# Patient Record
Sex: Female | Born: 1935 | Race: White | Hispanic: No | Smoking: Former smoker
Health system: Southern US, Community
[De-identification: ages and names within clinical notes are randomized; demographics above are authoritative.]

## PROBLEM LIST (undated history)

## (undated) DIAGNOSIS — F329 Major depressive disorder, single episode, unspecified: Secondary | ICD-10-CM

## (undated) DIAGNOSIS — D631 Anemia in chronic kidney disease: Secondary | ICD-10-CM

## (undated) DIAGNOSIS — I82409 Acute embolism and thrombosis of unspecified deep veins of unspecified lower extremity: Secondary | ICD-10-CM

## (undated) DIAGNOSIS — D510 Vitamin B12 deficiency anemia due to intrinsic factor deficiency: Secondary | ICD-10-CM

## (undated) DIAGNOSIS — H3530 Unspecified macular degeneration: Secondary | ICD-10-CM

## (undated) DIAGNOSIS — R42 Dizziness and giddiness: Secondary | ICD-10-CM

## (undated) DIAGNOSIS — I6529 Occlusion and stenosis of unspecified carotid artery: Secondary | ICD-10-CM

## (undated) DIAGNOSIS — K909 Intestinal malabsorption, unspecified: Secondary | ICD-10-CM

## (undated) DIAGNOSIS — D500 Iron deficiency anemia secondary to blood loss (chronic): Secondary | ICD-10-CM

## (undated) DIAGNOSIS — N183 Chronic kidney disease, stage 3 (moderate): Secondary | ICD-10-CM

## (undated) DIAGNOSIS — D649 Anemia, unspecified: Secondary | ICD-10-CM

## (undated) DIAGNOSIS — E042 Nontoxic multinodular goiter: Secondary | ICD-10-CM

## (undated) DIAGNOSIS — I10 Essential (primary) hypertension: Secondary | ICD-10-CM

## (undated) DIAGNOSIS — I739 Peripheral vascular disease, unspecified: Secondary | ICD-10-CM

## (undated) DIAGNOSIS — M1990 Unspecified osteoarthritis, unspecified site: Secondary | ICD-10-CM

## (undated) DIAGNOSIS — F32A Depression, unspecified: Secondary | ICD-10-CM

## (undated) DIAGNOSIS — I8390 Asymptomatic varicose veins of unspecified lower extremity: Secondary | ICD-10-CM

## (undated) DIAGNOSIS — Q2112 Patent foramen ovale: Secondary | ICD-10-CM

## (undated) DIAGNOSIS — K449 Diaphragmatic hernia without obstruction or gangrene: Secondary | ICD-10-CM

## (undated) DIAGNOSIS — Q211 Atrial septal defect: Secondary | ICD-10-CM

## (undated) DIAGNOSIS — N189 Chronic kidney disease, unspecified: Secondary | ICD-10-CM

## (undated) DIAGNOSIS — R011 Cardiac murmur, unspecified: Secondary | ICD-10-CM

## (undated) DIAGNOSIS — E785 Hyperlipidemia, unspecified: Secondary | ICD-10-CM

## (undated) DIAGNOSIS — I2510 Atherosclerotic heart disease of native coronary artery without angina pectoris: Secondary | ICD-10-CM

## (undated) HISTORY — DX: Anemia in chronic kidney disease: D63.1

## (undated) HISTORY — DX: Hyperlipidemia, unspecified: E78.5

## (undated) HISTORY — PX: OTHER SURGICAL HISTORY: SHX169

## (undated) HISTORY — DX: Diaphragmatic hernia without obstruction or gangrene: K44.9

## (undated) HISTORY — DX: Occlusion and stenosis of unspecified carotid artery: I65.29

## (undated) HISTORY — PX: LUMBAR DISC SURGERY: SHX700

## (undated) HISTORY — DX: Essential (primary) hypertension: I10

## (undated) HISTORY — DX: Iron deficiency anemia secondary to blood loss (chronic): D50.0

## (undated) HISTORY — DX: Asymptomatic varicose veins of unspecified lower extremity: I83.90

## (undated) HISTORY — PX: INCONTINENCE SURGERY: SHX676

## (undated) HISTORY — DX: Anemia, unspecified: D64.9

## (undated) HISTORY — PX: APPENDECTOMY: SHX54

## (undated) HISTORY — DX: Vitamin B12 deficiency anemia due to intrinsic factor deficiency: D51.0

## (undated) HISTORY — DX: Intestinal malabsorption, unspecified: K90.9

## (undated) HISTORY — DX: Acute embolism and thrombosis of unspecified deep veins of unspecified lower extremity: I82.409

## (undated) HISTORY — PX: TOE SURGERY: SHX1073

## (undated) HISTORY — DX: Unspecified macular degeneration: H35.30

## (undated) HISTORY — PX: EYE SURGERY: SHX253

## (undated) HISTORY — DX: Atherosclerotic heart disease of native coronary artery without angina pectoris: I25.10

## (undated) HISTORY — DX: Chronic kidney disease, stage 3 (moderate): N18.3

## (undated) HISTORY — PX: SPINE SURGERY: SHX786

## (undated) HISTORY — DX: Unspecified osteoarthritis, unspecified site: M19.90

## (undated) HISTORY — DX: Chronic kidney disease, unspecified: N18.9

## (undated) HISTORY — PX: CATARACT EXTRACTION: SUR2

## (undated) HISTORY — PX: VARICOSE VEIN SURGERY: SHX832

---

## 1969-08-24 HISTORY — PX: ABDOMINAL HYSTERECTOMY: SHX81

## 2000-02-05 ENCOUNTER — Other Ambulatory Visit: Admission: RE | Admit: 2000-02-05 | Discharge: 2000-02-05 | Payer: Self-pay | Admitting: Family Medicine

## 2000-03-11 ENCOUNTER — Other Ambulatory Visit: Admission: RE | Admit: 2000-03-11 | Discharge: 2000-03-11 | Payer: Self-pay | Admitting: Obstetrics and Gynecology

## 2000-04-07 ENCOUNTER — Other Ambulatory Visit: Admission: RE | Admit: 2000-04-07 | Discharge: 2000-04-07 | Payer: Self-pay | Admitting: Obstetrics and Gynecology

## 2000-04-07 ENCOUNTER — Encounter (INDEPENDENT_AMBULATORY_CARE_PROVIDER_SITE_OTHER): Payer: Self-pay

## 2000-12-06 ENCOUNTER — Other Ambulatory Visit: Admission: RE | Admit: 2000-12-06 | Discharge: 2000-12-06 | Payer: Self-pay | Admitting: Obstetrics and Gynecology

## 2000-12-21 ENCOUNTER — Encounter: Payer: Self-pay | Admitting: Family Medicine

## 2000-12-21 ENCOUNTER — Encounter: Admission: RE | Admit: 2000-12-21 | Discharge: 2000-12-21 | Payer: Self-pay | Admitting: Family Medicine

## 2000-12-23 ENCOUNTER — Ambulatory Visit (HOSPITAL_COMMUNITY): Admission: RE | Admit: 2000-12-23 | Discharge: 2000-12-23 | Payer: Self-pay | Admitting: Family Medicine

## 2000-12-23 ENCOUNTER — Encounter: Payer: Self-pay | Admitting: Family Medicine

## 2000-12-23 ENCOUNTER — Encounter (INDEPENDENT_AMBULATORY_CARE_PROVIDER_SITE_OTHER): Payer: Self-pay

## 2001-03-01 ENCOUNTER — Encounter: Payer: Self-pay | Admitting: Family Medicine

## 2001-03-01 ENCOUNTER — Ambulatory Visit (HOSPITAL_COMMUNITY): Admission: RE | Admit: 2001-03-01 | Discharge: 2001-03-01 | Payer: Self-pay | Admitting: Family Medicine

## 2001-03-15 ENCOUNTER — Encounter: Payer: Self-pay | Admitting: Neurological Surgery

## 2001-03-17 ENCOUNTER — Inpatient Hospital Stay (HOSPITAL_COMMUNITY): Admission: RE | Admit: 2001-03-17 | Discharge: 2001-03-18 | Payer: Self-pay | Admitting: Neurological Surgery

## 2001-03-17 ENCOUNTER — Encounter: Payer: Self-pay | Admitting: Neurological Surgery

## 2001-06-13 ENCOUNTER — Other Ambulatory Visit: Admission: RE | Admit: 2001-06-13 | Discharge: 2001-06-13 | Payer: Self-pay | Admitting: Obstetrics and Gynecology

## 2001-11-28 ENCOUNTER — Other Ambulatory Visit: Admission: RE | Admit: 2001-11-28 | Discharge: 2001-11-28 | Payer: Self-pay | Admitting: Obstetrics and Gynecology

## 2002-06-19 ENCOUNTER — Other Ambulatory Visit: Admission: RE | Admit: 2002-06-19 | Discharge: 2002-06-19 | Payer: Self-pay | Admitting: Obstetrics and Gynecology

## 2002-08-24 HISTORY — PX: BLADDER SURGERY: SHX569

## 2003-01-01 ENCOUNTER — Other Ambulatory Visit: Admission: RE | Admit: 2003-01-01 | Discharge: 2003-01-01 | Payer: Self-pay | Admitting: Obstetrics and Gynecology

## 2003-07-23 ENCOUNTER — Other Ambulatory Visit: Admission: RE | Admit: 2003-07-23 | Discharge: 2003-07-23 | Payer: Self-pay | Admitting: Obstetrics and Gynecology

## 2004-02-04 ENCOUNTER — Other Ambulatory Visit: Admission: RE | Admit: 2004-02-04 | Discharge: 2004-02-04 | Payer: Self-pay | Admitting: Obstetrics and Gynecology

## 2005-04-06 ENCOUNTER — Other Ambulatory Visit: Admission: RE | Admit: 2005-04-06 | Discharge: 2005-04-06 | Payer: Self-pay | Admitting: Obstetrics and Gynecology

## 2007-03-22 ENCOUNTER — Ambulatory Visit (HOSPITAL_COMMUNITY): Admission: RE | Admit: 2007-03-22 | Discharge: 2007-03-22 | Payer: Self-pay | Admitting: Obstetrics and Gynecology

## 2007-04-01 ENCOUNTER — Ambulatory Visit: Payer: Self-pay | Admitting: Internal Medicine

## 2007-04-05 ENCOUNTER — Ambulatory Visit (HOSPITAL_COMMUNITY): Admission: RE | Admit: 2007-04-05 | Discharge: 2007-04-05 | Payer: Self-pay | Admitting: Internal Medicine

## 2007-04-06 ENCOUNTER — Ambulatory Visit: Payer: Self-pay | Admitting: Cardiovascular Disease

## 2007-04-21 ENCOUNTER — Inpatient Hospital Stay (HOSPITAL_COMMUNITY): Admission: RE | Admit: 2007-04-21 | Discharge: 2007-04-23 | Payer: Self-pay | Admitting: Obstetrics and Gynecology

## 2008-05-24 ENCOUNTER — Ambulatory Visit: Payer: Self-pay | Admitting: Internal Medicine

## 2008-06-19 ENCOUNTER — Ambulatory Visit: Payer: Self-pay

## 2008-12-06 ENCOUNTER — Ambulatory Visit: Payer: Self-pay

## 2008-12-17 ENCOUNTER — Telehealth (INDEPENDENT_AMBULATORY_CARE_PROVIDER_SITE_OTHER): Payer: Self-pay | Admitting: *Deleted

## 2009-03-18 ENCOUNTER — Encounter: Payer: Self-pay | Admitting: Internal Medicine

## 2009-05-15 ENCOUNTER — Telehealth: Payer: Self-pay | Admitting: Internal Medicine

## 2009-06-03 ENCOUNTER — Encounter: Payer: Self-pay | Admitting: Internal Medicine

## 2009-06-13 ENCOUNTER — Encounter: Payer: Self-pay | Admitting: Internal Medicine

## 2009-06-13 ENCOUNTER — Ambulatory Visit: Payer: Self-pay

## 2009-06-21 ENCOUNTER — Encounter: Payer: Self-pay | Admitting: Internal Medicine

## 2009-06-21 DIAGNOSIS — I6529 Occlusion and stenosis of unspecified carotid artery: Secondary | ICD-10-CM | POA: Insufficient documentation

## 2009-06-24 ENCOUNTER — Telehealth: Payer: Self-pay | Admitting: Internal Medicine

## 2009-06-26 ENCOUNTER — Ambulatory Visit: Payer: Self-pay | Admitting: Cardiology

## 2009-07-04 ENCOUNTER — Telehealth: Payer: Self-pay | Admitting: Internal Medicine

## 2009-07-05 ENCOUNTER — Telehealth: Payer: Self-pay | Admitting: Internal Medicine

## 2009-07-16 ENCOUNTER — Telehealth: Payer: Self-pay | Admitting: Internal Medicine

## 2009-07-17 DIAGNOSIS — I10 Essential (primary) hypertension: Secondary | ICD-10-CM | POA: Insufficient documentation

## 2009-07-17 DIAGNOSIS — E785 Hyperlipidemia, unspecified: Secondary | ICD-10-CM | POA: Insufficient documentation

## 2009-07-29 ENCOUNTER — Ambulatory Visit: Payer: Self-pay | Admitting: Surgery

## 2009-07-29 ENCOUNTER — Encounter: Payer: Self-pay | Admitting: Internal Medicine

## 2009-08-02 ENCOUNTER — Ambulatory Visit: Payer: Self-pay | Admitting: Internal Medicine

## 2009-08-06 ENCOUNTER — Telehealth: Payer: Self-pay | Admitting: Internal Medicine

## 2009-10-14 ENCOUNTER — Encounter: Payer: Self-pay | Admitting: Internal Medicine

## 2009-10-22 ENCOUNTER — Encounter: Payer: Self-pay | Admitting: Internal Medicine

## 2009-10-25 ENCOUNTER — Ambulatory Visit: Payer: Self-pay | Admitting: Internal Medicine

## 2009-10-28 LAB — CONVERTED CEMR LAB
BUN: 13 mg/dL (ref 6–23)
Basophils Absolute: 0 10*3/uL (ref 0.0–0.1)
Basophils Relative: 0.4 % (ref 0.0–3.0)
CO2: 29 meq/L (ref 19–32)
Calcium: 9.1 mg/dL (ref 8.4–10.5)
Chloride: 96 meq/L (ref 96–112)
Creatinine, Ser: 0.8 mg/dL (ref 0.4–1.2)
Eosinophils Absolute: 0.1 10*3/uL (ref 0.0–0.7)
Eosinophils Relative: 1.6 % (ref 0.0–5.0)
GFR calc non Af Amer: 74 mL/min (ref >60)
Glucose, Bld: 99 mg/dL (ref 70–99)
HCT: 30.8 % — ABNORMAL LOW (ref 36.0–46.0)
Hemoglobin: 10 g/dL — ABNORMAL LOW (ref 12.0–15.0)
Lymphocytes Relative: 19.2 % (ref 12.0–46.0)
Lymphs Abs: 1.2 10*3/uL (ref 0.7–4.0)
MCHC: 32.6 g/dL (ref 30.0–36.0)
MCV: 95.2 fL (ref 78.0–100.0)
Monocytes Absolute: 0.6 10*3/uL (ref 0.1–1.0)
Monocytes Relative: 9.7 % (ref 3.0–12.0)
Neutro Abs: 4.3 10*3/uL (ref 1.4–7.7)
Neutrophils Relative %: 69.1 % (ref 43.0–77.0)
Platelets: 252 10*3/uL (ref 150.0–400.0)
Potassium: 4.7 meq/L (ref 3.5–5.1)
RBC: 3.23 M/uL — ABNORMAL LOW (ref 3.87–5.11)
RDW: 13.4 % (ref 11.5–14.6)
Sodium: 128 meq/L — ABNORMAL LOW (ref 135–145)
TSH: 2.2 microintl units/mL (ref 0.35–5.50)
WBC: 6.2 10*3/uL (ref 4.5–10.5)

## 2009-12-17 ENCOUNTER — Encounter: Payer: Self-pay | Admitting: Internal Medicine

## 2010-02-03 ENCOUNTER — Ambulatory Visit: Payer: Self-pay | Admitting: Surgery

## 2010-05-16 ENCOUNTER — Encounter: Payer: Self-pay | Admitting: Internal Medicine

## 2010-06-16 ENCOUNTER — Telehealth: Payer: Self-pay | Admitting: Internal Medicine

## 2010-08-14 ENCOUNTER — Encounter: Payer: Self-pay | Admitting: Internal Medicine

## 2010-08-14 ENCOUNTER — Ambulatory Visit: Payer: Self-pay | Admitting: Surgery

## 2010-09-23 NOTE — Progress Notes (Signed)
Summary: feet/ankles swollen yesterday  Phone Note Call from Patient   Caller: Patient [phone] Reason for Call: Talk to Nurse Summary of Call: pt's ankles and feet swelling yesterday was on her feet a lot, her dtr is nurse and insisted she call dr Tenny Craw today, however her swelling is completely gone today-anything she needs to do? Initial call taken by: Glynda Jaeger,  June 16, 2010 1:11 PM  Follow-up for Phone Call        Pt. stated that her feet were swollen yesterday. She elevated them yesterday & they are no longer swollen. Pt. has not gained a significant amount of weight and has been instructed to call us if she gains a significant amount in a short period of time. No C/O of CP or SOB. Pt. will check her weight daily & call us with any further problems or if her edema returns and does not resolve with  elevation. Whitney Maeola Sarah RN  June 16, 2010 1:24 PM  Follow-up by: Whitney Maeola Sarah RN,  June 16, 2010 1:18 PM

## 2010-09-23 NOTE — Assessment & Plan Note (Signed)
Summary: per check out/sf   Visit Type:  Follow-up [facility]  CC:  no complaints.  History of Present Illness: Patient is a 77 year old with a history of hypertension, CV disease, dyslipidemia.  She was last seen in December.  At that time I added 12.5 mg HCTZ because her bp was up. IN January she got the flu.  It has taken her a long time to recover. She has checked her bp since starting the HCTZ and says it has been better.  Systolic in 130s.  She denies dizziness. She had blood work at [facility] on 2/21/ and 2/28.  She was told NA was low and to stop HCTZ.  SHe has continued to take it.  Did not take today. Denies chest pain.  APpetite improveing.  Always cold  Current Medications (verified): 1)  Aspirin 81 Mg  Tabs (Aspirin) 2)  Lisinopril 20 Mg Tabs (Lisinopril) .Marland Kitchen.. 1 Tab Once Daily 3)  Calicum Witn Vitamin D .... 2 Tab Daily 4)  Estrogen Cream .... Q Week 5)  Allegra .... As Directed 6)  Ibprofen .... As Needed 7)  Lipitor 10 Mg Tabs (Atorvastatin Calcium) .... Take One Tablet By Mouth Daily. 8)  Amlodipine Besylate 10 Mg Tabs (Amlodipine Besylate) .... Take One Tablet By Mouth Daily 9)  Vit D Supplement .Marland Kitchen.. 1000 Units  2 Times A Day 10)  Hydrochlorothiazide 25 Mg Tabs (Hydrochlorothiazide) .... One Half A Tablet Every Day  Allergies (verified): 1)  ! * Regular Asa 2)  ! Pcn  Past History:  Past Medical History: Last updated: 07/17/2009 Current Problems:  DYSLIPIDEMIA (ICD-272.4) HYPERTENSION (ICD-401.9) CAROTID STENOSIS (ICD-433.10)  Past Surgical History: Last updated: 07/17/2009 Toe surgery, laser surgery for a retinal tear,   tonsillectomy, total vaginal hysterectomy.      Social History: Last updated: 07/17/2009  The patient smokes 2 cigarettes per day for the past 60   years, does not drink alcohol.   Vital Signs:  Patient profile:   77 year old female Height:      67 inches Weight:      126 pounds BMI:     19.81 Pulse  rate:   60 / minute BP sitting:   151 / 59  (left arm) Cuff size:   regular  Vitals Entered By: Burnett Kanaris, CNA (October 25, 2009 9:20 AM)  Physical Exam  Additional Exam:  HEENT:  Normocephalic, atraumatic. EOMI, PERRLA.  Neck: JVP is normal. No thyromegaly. No bruits.  Lungs: clear to auscultation. No rales no wheezes.  Heart: Regular rate and rhythm. Normal S1, S2. No S3.   No significant murmurs. PMI not displaced.  Abdomen:  Supple, nontender. Normal bowel sounds. No masses. No hepatomegaly.  Extremities:   Good distal pulses throughout. No lower extremity edema.  Musculoskeletal :moving all extremities.  Neuro:   alert and oriented x3.    Impression & Recommendations:  Problem # 1:  HYPERTENSION (ICD-401.9)  BP a little high on arrival  ON my check 145/45.  Wating for labs.  Na from 2/22 was 130.  Repeated 1/28  Na was 126.  She has continued HCTZ even though she has been told to stop.  Did not take today. Would check BMET and TSH and CBC t today.  Will get back in touch with her on what to do.  Hold HCTZ today. Her updated medication list for this problem includes:    Aspirin 81 Mg Tabs (Aspirin)    Lisinopril 20  Mg Tabs (Lisinopril) .Marland Kitchen... 1 tab once daily    Amlodipine Besylate 10 Mg Tabs (Amlodipine besylate) .Marland Kitchen... Take one tablet by mouth daily    Hydrochlorothiazide 25 Mg Tabs (Hydrochlorothiazide) ..... One half a tablet every day  Orders: TLB-BMP (Basic Metabolic Panel-BMET) (80048-METABOL) TLB-CBC Platelet - w/Differential (85025-CBCD) TLB-TSH (Thyroid Stimulating Hormone) (84443-TSH)  Problem # 2:  DYSLIPIDEMIA (ICD-272.4) Assessment: Unchanged Excellent control.  LDL 62, HDL 55.  COntinue.  Just checked 2/22:  LDL 52, HDL 74. Her updated medication list for this problem includes:    Lipitor 10 Mg Tabs (Atorvastatin calcium) .Marland Kitchen... Take one tablet by mouth daily.  Problem # 3:  CAROTID STENOSIS (ICD-433.10) F/U with Dr. Myra Gianotti this summer. Her updated  medication list for this problem includes:    Aspirin 81 Mg Tabs (Aspirin)  Patient Instructions: 1)  Your physician recommends that you return for lab work in: lab work today...we will call you with results.   Appended Document: per check out/sf Sodium is a little low.  Stop HCTZ.  Keep check of bp at home.  Send in readings in a few wks.  Appended Document: per check out/sf Patient aware of above and also LM on cell hone with her daughter [name] with lab results and need for BP checks at home.

## 2010-09-23 NOTE — Miscellaneous (Signed)
Summary: Orders Update  Clinical Lists Changes  Orders: Added new Test order of Carotid Duplex (Carotid Duplex) - Signed 

## 2010-10-10 ENCOUNTER — Encounter: Payer: Self-pay | Admitting: Internal Medicine

## 2010-10-21 ENCOUNTER — Encounter (INDEPENDENT_AMBULATORY_CARE_PROVIDER_SITE_OTHER): Payer: Self-pay | Admitting: *Deleted

## 2010-10-30 NOTE — Letter (Signed)
Summary: Appointment - Reminder 2  [facility], Main Office  [address]   [place], [place] [address]   Phone: [phone]  Fax: [phone]     October 21, 2010 MRN: [id number]   VERTA RIEDLINGER [address] [place], [place]  [address]   Dear Ms. Lillia Mountain,  Our records indicate that it is time to schedule a follow-up appointment.Dr.Ross recommended that you follow up with [place] in March. It is very important that we reach you to schedule this appointment. We look forward to participating in your health care needs. Please contact us at the number listed above at your earliest convenience to schedule your appointment.  If you are unable to make an appointment at this time, give [place] a call so we can update our records.     Sincerely, Artist

## 2010-12-15 ENCOUNTER — Ambulatory Visit (INDEPENDENT_AMBULATORY_CARE_PROVIDER_SITE_OTHER): Payer: Medicare Other | Admitting: Internal Medicine

## 2010-12-15 ENCOUNTER — Encounter: Payer: Self-pay | Admitting: Internal Medicine

## 2010-12-15 VITALS — BP 126/64 | HR 61 | Ht 67.0 in | Wt 136.0 lb

## 2010-12-15 DIAGNOSIS — I2510 Atherosclerotic heart disease of native coronary artery without angina pectoris: Secondary | ICD-10-CM

## 2010-12-15 DIAGNOSIS — I10 Essential (primary) hypertension: Secondary | ICD-10-CM

## 2010-12-15 DIAGNOSIS — I6529 Occlusion and stenosis of unspecified carotid artery: Secondary | ICD-10-CM

## 2010-12-15 DIAGNOSIS — R011 Cardiac murmur, unspecified: Secondary | ICD-10-CM

## 2010-12-15 DIAGNOSIS — E785 Hyperlipidemia, unspecified: Secondary | ICD-10-CM

## 2010-12-15 NOTE — Patient Instructions (Signed)
Your physician has requested that you have an echocardiogram. Echocardiography is a painless test that uses sound waves to create images of your heart. It provides your doctor with information about the size and shape of your heart and how well your heart's chambers and valves are working. This procedure takes approximately one hour. There are no restrictions for this procedure.  Your physician wants you to follow-up in: 12 months You will receive a reminder letter in the mail two months in advance. If you don't receive a letter, please call our office to schedule the follow-up appointment.  

## 2010-12-16 DIAGNOSIS — R011 Cardiac murmur, unspecified: Secondary | ICD-10-CM | POA: Insufficient documentation

## 2010-12-16 NOTE — Assessment & Plan Note (Signed)
Will f/u with V. Brabham.

## 2010-12-16 NOTE — Progress Notes (Signed)
HPIPatient is a 77 year old with a history of hypertension, CV disease, dyslipidemia.  She was last seen in March of last year. In the interval she has done OK.  She denies CP.  No dizziness.  Lipids drawn after the holidays showed LDL of 98 which is up from 71  Earlier.  Paitnet admits to diet indiscrestions over Xmas.  IS back to normal eatting. Due to be seen by Arelia Longest in June.    Allergies  Allergen Reactions  . Penicillins     Current Outpatient Prescriptions  Medication Sig Dispense Refill  . amLODipine (NORVASC) 10 MG tablet Take 10 mg by mouth daily.        Marland Kitchen aspirin 81 MG tablet Take 81 mg by mouth daily.        Marland Kitchen atorvastatin (LIPITOR) 10 MG tablet Take 10 mg by mouth daily.        . Calcium Citrate (CALCITRATE PO) Take by mouth. 2 tabs daily       . conjugated estrogens (PREMARIN) vaginal cream Place vaginally daily. Twice a week       . ibuprofen (ADVIL,MOTRIN) 200 MG tablet Take 200 mg by mouth every 6 (six) hours as needed.        Marland Kitchen lisinopril (PRINIVIL,ZESTRIL) 20 MG tablet Take 20 mg by mouth daily. 2 tabs daily       . VITAMIN D, CHOLECALCIFEROL, PO Take by mouth. 2 TABS DAILY (2000 UNITS DAILY)         No past medical history on file.  No past surgical history on file.  No family history on file.  History   Social History  . Marital Status: Widowed    Spouse Name: N/A    Number of Children: N/A  . Years of Education: N/A   Occupational History  . Not on file.   Social History Main Topics  . Smoking status: Former Smoker    Types: Cigarettes    Quit date: 08/24/2009  . Smokeless tobacco: Not on file  . Alcohol Use: Not on file  . Drug Use: Not on file  . Sexually Active: Not on file   Other Topics Concern  . Not on file   Social History Narrative  . No narrative on file    Review of Systems:  All systems reviewed.  They are negative to the above problem except as previously stated.  Vital Signs: BP 126/64  Pulse 61  Ht 5\' 7"  (1.702 m)   Wt 136 lb (61.689 kg)  BMI 21.30 kg/m2  Physical Exam  HEENT:  Normocephalic, atraumatic. EOMI, PERRLA.  Neck: JVP is normal. No thyromegaly. Bilateral carotid bruits. Lungs: clear to auscultation. No rales no wheezes.  Heart: Regular rate and rhythm. Normal S1, S2. No S3.   II/VI systolic murmur.Marland Kitchen PMI not displaced.  Abdomen:  Supple, nontender. Normal bowel sounds. No masses. No hepatomegaly.  Extremities:   Good distal pulses throughout. No lower extremity edema.   EKG:  Sinus rhythm.  65 bpm. Sl ST depression with T wave inversion. II, III, AVF, V3 to V6. Musculoskeletal :moving all extremities.  Neuro:   alert and oriented x3.  CN II-XII grossly intact.   Assessment and Plan:

## 2010-12-16 NOTE — Assessment & Plan Note (Signed)
Would keep on same regimen.  Diet is back to baseline.

## 2010-12-16 NOTE — Assessment & Plan Note (Signed)
Continue current meds 

## 2011-01-06 NOTE — Assessment & Plan Note (Signed)
[facility]                            CARDIOLOGY OFFICE NOTE   NAME:[name], [name]                      MRN:          [id number]  DATE:05/24/2008                            DOB:          Sep 27, 1930    IDENTIFICATION:  Mrs. Lomanto is a 77 year old woman, I last saw her in  August 2008, as part of a preop evaluation for gynecologic surgery.  She  had an abnormal EKG, underwent an echocardiogram that showed mild LVH.  Also, had carotid Dopplers done that showed moderate cerebrovascular  disease.   Because of the patient's status, I felt she was at low risk for a  cardiac event, underwent the surgery that was uneventful.   She comes in today for continued followup.  She denies chest pressure,  no shortness of breath.  No palpitations.   CURRENT MEDICINES:  1. Caduet 10/10.  2. Lisinopril 20.  3. Calcium with D.  4. Estrogen cream.  5. Aspirin 81.   PHYSICAL EXAMINATION:  GENERAL:  The patient is in no distress.  VITAL SIGNS:  Blood pressure 143/66, pulse 61 and regular, weight 133.  NECK:  JVP is normal.  Left carotid bruit.  LUNGS:  Clear without rales or wheezes.  CARDIAC:  Regular rate and rhythm, S1, S2, no S3, no significant  murmurs.  ABDOMEN:  Benign.  EXTREMITIES:  No lower extremity edema.   A 12-lead EKG shows normal sinus rhythm.  ST-T wave changes, consider  ischemia.  Note, does not meet criteria for LVH on today's exam.  ST  changes are unchanged.   IMPRESSION:  1. Cardiovascular disease.  Last carotid scan done in a year ago      showed moderate disease with 50-69% left ICA stenosis and a 50-69%      right stenosis.  I would set up for followup.  2. Dyslipidemia, on Caduet.  Last labs were in 2008.  She needs to      have her repeat drawn.  She is due to have her full evaluation.      She can have this checked.  3. Hypertension, adequate control.  4. Health care maintenance with cardiovascular disease.  Recommend  continue aspirin.   Tentatively, set followup for 1 year.  I will be in touch with her once  I have seen the blood work and the test result.     Pricilla Riffle, MD, [facility]  Electronically Signed    PVR/MedQ  DD: 05/24/2008  DT: 05/25/2008  Job #: [id number]   cc:   Guy Sandifer. Henderson Cloud, M.D.

## 2011-01-06 NOTE — Op Note (Signed)
[name], [name]               ACCOUNT NO.:  [id number]   MEDICAL RECORD NO.:  [id number]          PATIENT TYPE:  INP   LOCATION:  9308                          FACILITY:  [facility]   PHYSICIAN:  Guy Sandifer. Henderson Cloud, M.D. DATE OF BIRTH:  05/11/1931   DATE OF PROCEDURE:  04/21/2007  DATE OF DISCHARGE:                               OPERATIVE REPORT   PREOPERATIVE DIAGNOSES:  1. Pelvic relaxation.  2. Stress urinary continence.   POSTOPERATIVE DIAGNOSES:  1. Pelvic relaxation.  2. Stress urinary continence.   PROCEDURE:  Mid urethral transobturator tape.  Anterior vaginal repair  with Perigee graft, posterior vaginal repair and cystoscopy.   SURGEON:  Guy Sandifer. Henderson Cloud, M.D.   ASSISTANT:  Richarda Overlie, MD   ANESTHESIA:  General with endotracheal intubation.   ESTIMATED BLOOD LOSS:  150 mL.   INDICATIONS AND CONSENT:  The patient is a 77 year old white female G7,  P7, status post hysterectomy with increasing discomfort.  Details are  dictated in history and physical.  Anterior-posterior repair with  possible grafts, mid urethral sling has been discussed with the patient  preoperatively.  Potential risks and complications have been discussed  preoperatively including but not limited to infection, bowel, bladder,  ureteral damage, bleeding requiring transfusion of blood products with  possible transfusion reaction, HIV and hepatitis acquisition, DVT, PE,  pneumonia, fistula formation, vaginal erosion, pelvic pain, possible  need for return to the operating room prolonged catheterization or  prolonged self-catheterization, recurrent stress urinary continence and  irritative voiding symptoms.  All questions were answered and consent is  signed the chart.   PROCEDURE:  The patient's taken to operating room where she is  identified, placed in dorsosupine position and general anesthesia was  induced via endotracheal intubation.  She is then placed in dorsal  lithotomy position where  she is prepped and draped in sterile fashion.  Foley catheter is placed in the bladder and the bladder was drained and  the catheter is clamped off.  The anterior vaginal mucosa is injected  with 1/2% lidocaine with 1:200,000 epinephrine below the base the  bladder.  A linear anterior vaginal incision is made in the midline and  the vaginal mucosa is dissected from the underlying bladder with sharp  and blunt dissection to a point approximately 3 cm below the urethral  meatus.  This dissection is carried out bilaterally until the ischial  spine could be palpated.  After marking and injecting with the same  solution, incisions were made on the vulva in the obturator foramen at  the level of the clitoris as well as 2 cm lateral and 3 cm inferior.  Then first using the pink helical needles, these were passed bilaterally  through the obturator foramen with the needle tip passage controlled  with the examining finger bilaterally.  Both were exited through the  incision in the vaginal mucosa.  The pink attachments on the arms of the  Perigee graft were then placed on these needles and withdrawn back  through the incisions bilaterally.  Then using the gray helical needles,  these were passed through the inferior  incisions bilaterally aiming the  needle tip toward the ischial spines and exiting through the pelvic  sidewall with the passage of the needle tip controlled with the  examining finger as well.  The other set of arms are attached to these  which are then withdrawn through the incisions bilaterally.  Excess  graft is trimmed and the graft seats nicely without any puckering or  rolling.  It should be noted this was __________ biologic graft.  The  vaginal mucosa is then closed in a running locking fashion with 2-0  Monocryl suture.  After again injecting with additional solution of the  same composition, a small infraurethral incision is made in the midline  of the anterior vaginal mucosa  and dissected bilaterally to the  urogenital diaphragm.  Then using the halo needles for the polypropylene  mid urethral sling.  They are passed through the superior vulvar  incisions through the obturator foramen again with the needle tip  passage controlled with the examining finger bilaterally.  After passage  of the needles, the Foley catheter was withdrawn and cystoscopy is  carried out with a 70 degrees scope.  360 degrees inspection reveals no  evidence of penetration of the bladder, no foreign bodies and a good  puff of urine from the ureteral orifices were noted bilaterally.  Foley  catheter is then replaced the bladder is drained.  The polypropylene  sling is then attached the needle tips and withdrawn through the skin  incisions bilaterally.  The sheath is removed.  Proper tension is noted  in that there is 1-2 mm of space between the sling and the urethra and a  Kelly clamp placed below the sling can easily be rotated 90 degrees to  the floor without tension.  The anterior vaginal mucosa is closed again  with running locking 2-0 Monocryl suture.  The sheath is removed from  all of the arms through the various incisions and then the arms are  trimmed at the level of the skin.  Examination reveals good apical  support.  There is a small amount of laxity posteriorly primarily at the  vaginal introitus.  Therefore it is elected not to use the apogee graft  posteriorly.  Posterior vaginal mucosa is injected in the midline and  bilaterally with the same solution.  The diamond shaped wedge of tissue  was removed from posterior perineal body and the posterior vaginal  mucosa is dissected in the midline from the underlying rectum.  This is  carried out bilaterally.  Zero Monocryl suture is used to reapproximate  the rectovaginal fascia in the midline in interrupted fashion.  The  posterior vaginal mucosa is then closed in a running locking manner with  2-0 Monocryl suture which is  carried down to the perineum which is  closed in standard episiotomy type fashion with the same suture.  Examination reveals good support but two fingers could be readily  admitted to the vaginal introitus.  The skin incisions are closed with a  single 2-0 Monocryl suture and Dermabond was applied to all the  incisions as well.  1 inch packing with estrogen cream was placed in the  vagina.  All counts were correct.  The patient is awakened and taken to  recovery room in stable condition.      Guy Sandifer Henderson Cloud, M.D.  Electronically Signed     JET/MEDQ  D:  04/21/2007  T:  04/22/2007  Job:  [id number]

## 2011-01-06 NOTE — Discharge Summary (Signed)
[name], [name]               ACCOUNT NO.:  [id number]   MEDICAL RECORD NO.:  [id number]          PATIENT TYPE:  INP   LOCATION:  9308                          FACILITY:  [facility]   PHYSICIAN:  Guy Sandifer. Henderson Cloud, M.D. DATE OF BIRTH:  31-Oct-1930   DATE OF ADMISSION:  04/21/2007  DATE OF DISCHARGE:  04/23/2007                               DISCHARGE SUMMARY   ADMITTING DIAGNOSES:  1. Pelvic relaxation.  2. Stress urinary incontinence.   DISCHARGE DIAGNOSES:  1. Pelvic relaxation.  2. Stress urinary incontinence.   PROCEDURE PERFORMED:  On 04/21/07 he had a trans obturator mid urethral  sling, anterior repair with perigee graft, posterior vaginal repair, and  cystoscopy.   REASON FOR ADMISSION:  This patient is a 77 year old white female, G7,  P7 status post hysterectomy with increasing symptoms of pelvic  relaxation and stress urinary incontinence.  The details were dictated  in the history and physical.  She is admitted for surgical management.   HOSPITAL COURSE:  The patient was admitted to the hospital, taken to the  operating room, and undergoes the above procedure.  Estimated blood loss  was 150 cc.  On the evening of surgery she has good pain relief, no  nausea or vomiting.  Urine output is clear.  Vital signs are stable and  she is afebrile.   On the first postoperative day she is passing flatus and tolerating a  regular diet.  She is getting good pain relief.  The vital signs are  stable.  She is afebrile.  Hemoglobin is 9.3.  The Foley catheter was  removed and voiding trials were commenced.  The patient voids well with  residual urines of less than 100 cc.  On the day of discharge she is  ambulating, feels good, and wants to go home.   CONDITION ON DISCHARGE:  Good.   DISCHARGE DIET:  Regular as tolerated.   DISCHARGE ACTIVITY:  No lifting, no operation of automobiles, no vaginal  entry.  She is to call the office for problems including but not limited  to a  temperature of 101 degrees, persistent nausea, vomiting, increasing  pain, or heavy vaginal bleeding.   DISCHARGE MEDICATIONS:  Percocet 5/325 mg, number 30 one to two p.o. q.6  hours p.r.n., ibuprofen 600 mg q.6 hours p.r.n., multivitamin daily, a  stool softener daily.   DISCHARGE FOLLOWUP:  Follow-up is in the office in two weeks, double  voiding is discussed.      Guy Sandifer Henderson Cloud, M.D.  Electronically Signed     JET/MEDQ  D:  04/23/2007  T:  04/24/2007  Job:  [id number]   cc:   Guy Sandifer. Henderson Cloud, M.D.  Fax: [phone]

## 2011-01-06 NOTE — Procedures (Signed)
CAROTID DUPLEX EXAM   INDICATION:  Follow up known carotid disease.   HISTORY:  Diabetes:  No.  Cardiac:  No.  Hypertension:  Yes.  Smoking:  Quit in January, 2011.  Previous Surgery:  No.  CV History:  Asymptomatic.  Amaurosis Fugax No, Paresthesias No, Hemiparesis No.                                       RIGHT             LEFT  Brachial systolic pressure:         160               160  Brachial Doppler waveforms:         Normal            Normal  Vertebral direction of flow:        Antegrade         Antegrade  DUPLEX VELOCITIES (cm/sec)  CCA peak systolic                   41                93  ECA peak systolic                   Occluded          397  ICA peak systolic                   385               199  ICA end diastolic                   105               36  PLAQUE MORPHOLOGY:                  Calcific          Calcific  PLAQUE AMOUNT:                      Moderate-to-severe                  Moderate  PLAQUE LOCATION:                    ICA, ECA          ICA, ECA   IMPRESSION:  1. Doppler velocities suggest a high-end 60% to 79% stenosis in the      right internal carotid artery.  Right external carotid artery      occlusion.  2. Doppler velocities suggest 60% to 79% in the left internal carotid      artery with external carotid artery stenosis.  3. Antegrade flow in bilateral vertebral arteries.  4. Patient to come back in 6 months per Dr. Myra Gianotti.   ___________________________________________  V. Charlena Cross, MD   NT/MEDQ  D:  02/03/2010  T:  02/03/2010  Job:  [id number]

## 2011-01-06 NOTE — Procedures (Signed)
CAROTID DUPLEX EXAM   INDICATION:  Follow up known carotid artery disease.   HISTORY:  Diabetes:  No.  Cardiac:  No.  Hypertension:  Yes.  Smoking:  Yes.  Previous Surgery:  CV History:  Amaurosis Fugax No, Paresthesias No, Hemiparesis No.                                       RIGHT             LEFT  Brachial systolic pressure:         157               178  Brachial Doppler waveforms:  Vertebral direction of flow:        Antegrade         Antegrade  DUPLEX VELOCITIES (cm/sec)  CCA peak systolic                   50                109  ECA peak systolic                   Occluded          480  ICA peak systolic                   361               216  ICA end diastolic                   80                44  PLAQUE MORPHOLOGY:                  Calcified         Calcified  PLAQUE AMOUNT:                      Moderate-to-severe                  Moderate  PLAQUE LOCATION:                    ICA, ECA          ICA, ECA   IMPRESSION:  1. 60-79% stenosis noted in bilateral internal carotid arteries, right      greater than left.  2. Antegrade bilateral vertebral arteries.    ___________________________________________  V. Charlena Cross, MD   MG/MEDQ  D:  07/29/2009  T:  07/30/2009  Job:  [id number]

## 2011-01-06 NOTE — Assessment & Plan Note (Signed)
[facility]                       [facility] OFFICE NOTE   NAME:[name], [name]                      MRN:          [id number]  DATE:04/01/2007                            DOB:          1930-09-19    IDENTIFICATION:  Patient is a 77 year old who was referred for  evaluation of preoperative risk stratification, noted to have an  abnormal EKG preoperatively.   HISTORY OF PRESENT ILLNESS:  The patient is due to have an A&P repair by  Dr. Henderson Cloud.  In the preoperative evaluation her EKG was noted to be  abnormal and this was delayed.   In talking to the patient she has no known history of coronary artery  disease.  She is active taking care of her grandchildren who are 7 and  11 each.  She has been doing this for 10 years.  She does light  housekeeping, works in the yard, is active around the children, can  climb stairs without a problem.  Denies chest pain, no significant  shortness of breath.   ALLERGIES:  PENICILLIN, ALSO ASPIRIN LEASING TO EASY BLEEDING.   CURRENT MEDICATIONS:  1. Caduet 10/10.  2. Lisinopril 30 daily.  3. Calcium with D 2 daily.  4. Estrogen cream.  5. Patient stopped aspirin 81 mg daily recently.   PAST MEDICAL HISTORY:  1. Hypertension, diagnosed 2000.  2. Dyslipidemia, reports her last cholesterol was good.   SOCIAL HISTORY:  The patient smokes 2 cigarettes per day for the past 60  years, does not drink alcohol.   FAMILY HISTORY:  Father died of old age at 82, mother died of cancer at  age 78.  Two brothers who are alive, one sister died of cancer, 2  sisters alive.  No known history of coronary artery disease.   REVIEW OF SYSTEMS:  Patient wears glasses, dentures.  Has hiatal hernia,  stress incontinence, varicosities.  Otherwise, all systems reviewed,  negative to the problem except as noted.   PHYSICAL EXAMINATION:  Patient is a little anxious being in a Cardiology  office but no acute distress.  Denies  chest pain, blood pressure is  158/66, pulse is 55 and regular, weight 136.  HEENT:  Normocephalic/atraumatic, EOMI/PERRL.  Throat clear, nares  clear, mucus membranes moist.  NECK:  Left carotid bruit, thyroid is slightly prominent, JVP is normal.  LUNGS:  Clear to auscultation, no rales or wheezes.  CARDIAC:  Regular rate and rhythm, S1-S2, no S3, no significant murmurs.  ABDOMEN:  Supple, nontender, no hepatomegaly.  EXTREMITIES:  Good posterior tibial pulses, no lower extremity edema.   Twelve lead EKG done in the preoperative area showed sinus rhythm,  slight ST depression with T wave inversion in the anterior precordium,  but I can not find the EKG does not meet voltage criteria for LVH, will  need to get from the hospital.   IMPRESSION:  Patient is a 77 year old woman being evaluated for surgery,  found to have an abnormal EKG.  She has a history of hypertension,  dyslipidemia, very minimal tobacco use but for a long period.   On examination significant only for  a carotid bruit, otherwise  unremarkable.  EKG as noted above but I do not have the hard copy.  We  will need to get another copy of this.  It may indeed reflect some LVH  with her hypertension.   What I have recommended is an echocardiogram to evaluate her LV  sickness, LV size and function.  Also carotid Dopplers given the bruit.  I will check with Dr. Nelly Laurence office regarding her lipids.   If the pumping function is normal I would go ahead and schedule surgery  without any further testing.  It sounds as if she is active with good  exercise tolerance without symptoms.  I do not think any further testing  again, if her LV pumps normally, would provide any extra information.  I  will be in touch with her and Dr. Henderson Cloud once I have seen the test  results.     Pricilla Riffle, MD, [facility]  Electronically Signed    PVR/MedQ  DD: 04/01/2007  DT: 04/02/2007  Job #: [id number]

## 2011-01-06 NOTE — Procedures (Signed)
CAROTID DUPLEX EXAM   INDICATION:  Follow-up known coronary artery disease.   HISTORY:  Diabetes:  No.  Cardiac:  No.  Hypertension:  Yes.  Smoking:  Quit January 2011.  Previous Surgery:  No.  CV History:  Asymptomatic.  Amaurosis Fugax No, Paresthesias No, Hemiparesis No                                       RIGHT             LEFT  Brachial systolic pressure:         155               148  Brachial Doppler waveforms:         Normal            Normal  Vertebral direction of flow:        Antegrade         Retrograde  DUPLEX VELOCITIES (cm/sec)  CCA peak systolic                   28                112  ECA peak systolic                   Occluded          497  ICA peak systolic                   324               213  ICA end diastolic                   101               41  PLAQUE MORPHOLOGY:                  Calcific          Calcific  PLAQUE AMOUNT:                      Moderate/severe   Moderate  PLAQUE LOCATION:                    ICA, ECA          ICA, CCA, ECA   IMPRESSION:  1. Right internal carotid artery velocities suggest 60% to 79%      stenosis.  2. Right external carotid artery occlusion.  3. Left internal carotid artery velocities suggest 40% to 59%      stenosis.  4. Left external carotid stenosis.   ___________________________________________  V. Charlena Cross, MD   EM/MEDQ  D:  08/14/2010  T:  08/14/2010  Job:  [id number]

## 2011-01-06 NOTE — Assessment & Plan Note (Signed)
OFFICE VISIT   Jessica, Burns  DOB:  [birth year]                                       07/29/2009  ZOXWR#:[id number]   REASON FOR VISIT:  Carotid stenosis.   HISTORY:  This is a 77 year old female seen at the request of Dr. Tenny Craw  for evaluation of carotid stenosis.  The patient had an ultrasound which  revealed 60% to 79% right stenosis and 40% to 59% left stenosis.  This  was followed up with a CT scan which revealed 70% to 90% right internal  carotid stenosis and 60% left carotid stenosis.  The patient has been  asymptomatic.  Specifically, she denies numbness or weakness in either  extremity.  She denies headaches.  She denies slurring of her speech.  She denies amaurosis fugax.   The patient has a history of hypertension, hypercholesterolemia, both  which are managed medically.  She had recently undergone a 2-D echo  which shows mild left ventricular hypertrophy with an ejection fraction  of 60%.  She does have a patent foramen ovale.   REVIEW OF SYSTEMS:  Positive for hiatal hernia and arthritis.   PAST MEDICAL HISTORY:  Hypertension, hypercholesterolemia.   PAST SURGICAL HISTORY:  Bladder sling, hysterectomy in 1971, lumbar disk  surgery, neck muscle surgery, vein stripping left leg, cataracts plus  retinal tears repaired.   FAMILY HISTORY:  Negative for cardiovascular disease at an early age.   SOCIAL HISTORY:  She has 6 children.  She currently smokes a pack of  cigarettes every week.  She does not drink alcohol.   ALLERGIES:  PENICILLIN AND ASPIRIN.   EXAMINATION:  Heart rate is 61, blood pressure 157/53, temperature 97.9.  GENERAL:  She is well appearing, in no distress.  HEAD:  Normocephalic, atraumatic.  EENT:  Pupils are equal, sclerae are  anicteric.  LUNGS:  Clear bilaterally.  CARDIOVASCULAR:  Regular rate and rhythm.  She has bilateral carotid  bruits.  ABDOMEN:  Soft, nontender.  No pulsatile mass.  MUSCULOSKELETAL:  No major  deformity.  NEURO:  No focal deficits.  SKIN:  Without rash.  PSYCH:  She has normal affect.   I have independently reviewed her CT scan.  She does have 50% stenosis  of her innominate artery in addition to the above-mentioned carotid  disease.  The ultrasound was repeated today confirming 60% to 79%  stenosis on the right and on the left.   ASSESSMENT/PLAN:  Asymptomatic bilateral carotid stenosis, right greater  than left.  The patient has had multiple imaging modalities to confirm  this.  Right now, being less than 80%, I would continue with medical  management including antihypertension and statin therapy as well as an  aspirin.  I will plan on seeing her back in 6 months with a repeat  carotid ultrasound.   Jorge Ny, MD  Electronically Signed   VWB/MEDQ  D:  07/29/2009  T:  07/30/2009  Job:  [id number]   cc:   Pricilla Riffle, MD, [facility]

## 2011-01-06 NOTE — H&P (Signed)
[name], [name]               ACCOUNT NO.:  [id number]   MEDICAL RECORD NO.:  [id number]          PATIENT TYPE:  AMB   LOCATION:  SDC                           FACILITY:  [facility]   PHYSICIAN:  Guy Sandifer. Henderson Cloud, M.D. DATE OF BIRTH:  [birth year]   DATE OF ADMISSION:  03/23/2007  DATE OF DISCHARGE:                              HISTORY & PHYSICAL   CHIEF COMPLAINT:  Pelvic relaxation and leaking urine.   HISTORY OF PRESENT ILLNESS:  The patient is a 77 year old white female  G7, P7, status post hysterectomy, who has increasing pelvic discomfort.  She feels as though her bladder protrudes through the vagina at times.  She also has leaking of urine.  Urodynamics are consistent with stress  urinary incontinence.  After discussion of the options she is being  admitted for anterior/posterior vaginal repair with Apogee and Perigee  grafts as well as a trans-obturator mid urethral sling.  Potential risks  and complications have been discussed with patient preoperatively.   PAST MEDICAL HISTORY:  1. Chronic hypertension.  2. Degenerative disc disease.  3. Osteopenia.  4. Vaginal intraepithelial neoplasia.   PAST SURGICAL HISTORY:  Toe surgery, laser surgery for a retinal tear,  tonsillectomy, total vaginal hysterectomy.   FAMILY HISTORY:  Diabetes in son.  Uterine cancer in mother.  Esophageal  cancer in sister.   OBSTETRIC HISTORY:  Vaginal delivery x7.   MEDICATIONS:  1. Caduet 10/10 mg.  2. Lisinopril 30 mg.   ALLERGIES:  PENICILLIN.   SOCIAL HISTORY:  Smokes one pack of cigarettes every 4-5 days for the  past 60 years.  Denies alcohol or drug abuse.   REVIEW OF SYSTEMS:  NEURO:  Denies headache.  CARDIOVASCULAR:  Denies  chest pain.  PULMONARY:  Denies shortness of breath.  GI:  Denies recent  changes in bowel habits.   PHYSICAL EXAMINATION:  VITAL SIGNS:  Height 5 feet, 7 inches, weight 143  pounds.  Blood pressure 154/60.  HEENT:  Without thyromegaly.  LUNGS:  Clear to  auscultation.  HEART:  Regular rate and rhythm.  BACK:  Without CVA tenderness.  BREASTS:  Without mass, retraction, discharge.  ABDOMEN:  Soft, nontender without masses.  PELVIC EXAM:  Vagina without lesion, the anterior vagina is just inside  the introitus. Adnexa nontender without masses. Rectovaginal septum is  attenuated.  EXTREMITIES:  Grossly within normal limits.  NEUROLOGICAL EXAM:  Grossly within normal limits.   ASSESSMENT:  Pelvic relaxation, stress urinary incontinence.   PLAN:  A&P repair with Apogee, Perigee slings and a trans-obturator mid  urethral sling.      Guy Sandifer Henderson Cloud, M.D.  Electronically Signed     JET/MEDQ  D:  03/11/2007  T:  03/11/2007  Job:  [id number]

## 2011-01-08 ENCOUNTER — Ambulatory Visit (HOSPITAL_COMMUNITY): Payer: Medicare Other | Attending: Internal Medicine | Admitting: Radiology

## 2011-01-08 DIAGNOSIS — I2510 Atherosclerotic heart disease of native coronary artery without angina pectoris: Secondary | ICD-10-CM | POA: Insufficient documentation

## 2011-01-08 DIAGNOSIS — I10 Essential (primary) hypertension: Secondary | ICD-10-CM | POA: Insufficient documentation

## 2011-01-08 DIAGNOSIS — R011 Cardiac murmur, unspecified: Secondary | ICD-10-CM

## 2011-01-08 DIAGNOSIS — E785 Hyperlipidemia, unspecified: Secondary | ICD-10-CM | POA: Insufficient documentation

## 2011-01-08 DIAGNOSIS — I6529 Occlusion and stenosis of unspecified carotid artery: Secondary | ICD-10-CM | POA: Insufficient documentation

## 2011-01-09 NOTE — H&P (Signed)
[place]. [facility]  Patient:    [name], [name]                        MRN: [id number] Adm. Date:  03/17/01 Attending:  Stefani Dama, M.D.                         History and Physical  PREOPERATIVE DIAGNOSIS:  L4-5 herniated nucleus pulposus with left lumbar radiculopathy.  POSTOPERATIVE DIAGNOSIS:  L4-5 herniated nucleus pulposus with left lumbar radiculopathy.  PROCEDURE:  L4-5 bilateral laminotomy with diskectomy with operating microscope and microdissection technique.  SURGEON:  Stefani Dama, M.D.  FIRST ASSISTANT:  Tanya Nones. Jeral Fruit, M.D.  ANESTHESIA:  General endotracheal.  INDICATIONS:  Patient is a 77 year old lady who has a significant amount of spondylosis at the L4-5 level and has had a severe left lumbar radiculopathy in L5 distribution.  She has a herniated nucleus pulposus at L4-5 that appears to be more eccentric to the right side.  However, she has a severe left lumbar radiculopathy.  There is a relative amount of foraminal stenosis on that left side which could cause some compromise of the left L5 nerve root.  The patient was advised regarding surgery, having failed efforts at conservative management including oral prednisone and nonsteroidal anti-inflammatories and bedrest for the past two weeks.  She notes that the pain is excruciating and she is not tolerating it despite ever increases doses of narcotic analgesia. The patient was complaining that the pain was so severe that on July 9 she called the EMS to be taken to the emergency room and was given a steroid shot at that time.  PAST MEDICAL HISTORY:  Reveals that her general health has been fair.   She has had some hypertension.  Surgeries in the past include some muscle resection in the area of the left clavicle in 1964.  She had a hysterectomy in 1970 and surgery in both feet in the 1980s, and cataract surgery a year ago.  ALLERGIES:  The patient notes an allergy to  ASPIRIN, which tends to cause bleeding.  MEDICATIONS: 1. Norvasc 10 mg a day for high blood pressure. 2. Lipitor 10 mg a day for high cholesterol. 3. Prednisone Dosepak that has been completed. 4. Calcium. 5. Vitamin D. 6. Baby aspirin.  SOCIAL HISTORY:  Reveals that the patient smokes two cigarettes per day.  She does not drink alcohol.  HEIGHT AND WEIGHT:  Have been stable at 146 pounds and 5 feet 7 inches.  SYSTEMS REVIEW:  Positive for wearing glasses.  She has high blood pressure, high cholesterol, leg pain while walking, back pain and leg pain.  PHYSICAL EXAMINATION:  GENERAL:  Reveals that she in alert, oriented, and cooperative individual. Thought and recent memory are intact.  VITAL SIGNS:  Blood pressure 140/82, heart rate 66, respirations 16.  NEUROLOGIC:  She stands straight and erect without difficulty.  She has limited flexion forward to 45 degrees and approximately 50% of normal.  She extends normally.  She has modest amount of paravertebral spasm and there is modest amount of tenderness to palpation and percussion of the lumber spine. Motor strength in the lower extremities reveals the iliopsoas, quadriceps, tibialis anterior, and gastrocnemius have normal strength, tone, and bulk to confrontation.  Deep tendon reflexes are 2+ in the patella, 1+ in the Achilles.  Babinskis are downgoing.  Strength is negative to 80 degrees bilaterally.  Patricks maneuver  is negative bilaterally.  Sensation is intact to pin and light touch in the distal lower extremities.  Upper extremity strength and reflexes are within the limits of normal.  Cranial nerve examination reveals the pupils are 4 mm, briskly reactive to light and accommodation.  The extraocular movements are full.  Face is symmetric to grimace.  Tongue and uvula are in the midline.  Sclerae and conjunctivae are clear.  IMPRESSION:  The patient has evidence of spondylitic disease in the lower lumbar spine with a  herniation at the L4-5 level central and to the right. She is now to be admitted to undergo surgical decompression because she has a left lumbar radiculopathy.  There is a significant spondylitic disease causing some foraminal stenosis on that left side also.  The risks and benefits of the surgery were explained.  A bilateral approach will be performed. DD:  03/17/01 TD:  03/18/01 Job: [id number]

## 2011-01-09 NOTE — Op Note (Signed)
[place]. [facility]  Patient:    [name], [name]                      MRN: [id number] Proc. Date: 03/17/01 Adm. Date:  [id number] Attending:  Jonne Ply                           Operative Report  PREOPERATIVE DIAGNOSIS:  L4-5 disk herniation on the right with left lumbar radiculopathy.  POSTOPERATIVE DIAGNOSIS:  L4-5 disk herniation on the right with left lumbar radiculopathy.  PROCEDURE:  Bilateral laminotomies of L4-5 with L4-5 diskectomy, operating microscope, and microdissection technique.  SURGEON:  Stefani Dama, M.D.  FIRST ASSISTANT:  Tanya Nones. Jeral Fruit, M.D.  ANESTHESIA:  General endotracheal.  INDICATIONS:  The patient is a 77 year old individual who has a severe left lumbar radiculopathy in the L5 distribution.  She has a herniated nucleus pulposus at the L4-5 level in addition to significant spondylitic disease. She was advised regarding bilateral surgery.  DESCRIPTION OF PROCEDURE:  The patient was brought to the operating room supine on the stretcher.  After smooth induction of general endotracheal anesthesia, she was turned prone.  The back was shaved, prepped with DuraPrep, and draped in a sterile fashion.  A midline incision was created over the L4-5 space and this was carried down to the lumbodorsal fascia which was opened on either side of the midline.  The first spinous process was identified as L4 on the radiograph, and then the interlaminar space at the L4-5 was cleared.  Laminotomies were created bilaterally, removing the inferior margin of lamina after the mesial wall of the facet at L4-5.  Once this area was cleared, then the yellow ligament on the left side was cleared and opened.  The common dural tube was identified and the takeoff of the L5 nerve root was identified, and this was noted to be pushed medially, and by significant _______.  The microscope was brought into the field and draped, and  microdissection technique was used to divide the epidural veins along this area, and gradually mobilize the L5 nerve root medially.  On the under surface and the medial aspect of the nerve in the area of the foramen, several fragments of disk material were encountered.  These were removed and after further microdissection, this area of the L5 nerve root was greatly relieved.  The common dural tube was then mobilized medially.  There was noted to be a rent in the posterior longitudinal ligament and into the area of the disk space.  This was enlarged and the disk space was opened and cleared of a significant quantity of markedly degenerated disk material.  Once this area was well-decompressed and hemostasis from the soft tissues and the epidural vein controlled with bipolar cautery and some dissection technique.  The opposite side was addressed, and the common dural tube, and the L5 nerve root from there was retracted medially.  The disk space was noted to be very soft.  It was incised and several subligamentous fragments of disk were encountered here.  The disk space was then entered and cleared of a significant quantity of modestly degenerated disk material.  Once this area was cleared, also hemostasis was again achieved.  The area was copiously irrigated with antibiotic irrigating solution.  The lumbodorsal fascia was reapproximated with #1 Vicryl in an interrupted fashion, 2-0 Vicryl was used in the subcutaneous tissues, and 3-0 Vicryl subcuticularly.  The patient tolerated the procedure well and was returned to the recovery room in stable condition. DD:  03/17/01 TD:  03/18/01 Job: [id number]

## 2011-01-09 NOTE — Procedures (Signed)
[name], [name]               ACCOUNT NO.:  [id number]   MEDICAL RECORD NO.:  [id number]          PATIENT TYPE:  OUT   LOCATION:  RAD                           FACILITY:  [facility]   PHYSICIAN:  Noralyn Pick. Eden Emms, MD, FACCDATE OF BIRTH:  1930/10/20   DATE OF PROCEDURE:  DATE OF DISCHARGE:  04/05/2007                                ECHOCARDIOGRAM   PROCEDURE:  2-D echocardiogram.   INDICATIONS:  Bradycardia, LVH, check LV function.   PROCEDURE IN DETAIL:  Left ventricular cavity size was normal.  There  was mild LVH.  EF was 60%.  There was no regional wall motion  abnormality.  Mitral valve was structurally normal with trivial MR.  Left atrial and right sided cardiac chambers were normal.  There was  mild TR.  There was no evidence of pulmonary hypertension.  Aortic valve  was trileaflet and sclerotic.  Aortic root was normal.  Subcostal  imaging revealed a patent foramen ovale.  There was no pericardial  effusion, no source of embolus.   M-mode measurements showed an aortic dimension of 31 mm, left atrial  dimension 33 mm, septal thickness 13 mm, LV diastolic dimension 36 mm,  LV systolic dimension 26 mm.   FINAL IMPRESSION:  1. Mild left ventricular hypertrophy, ejection fraction 60%.  2. Normal right ventricle and atria.  3. Aortic valve sclerosis.  4. Trivial mitral regurgitation.  5. Mild tricuspid regurgitation.  6. Patent foramen ovale.      Noralyn Pick. Eden Emms, MD, [facility]  Electronically Signed     PCN/MEDQ  D:  04/06/2007  T:  04/07/2007  Job:  [id number]

## 2011-01-13 ENCOUNTER — Telehealth: Payer: Self-pay | Admitting: *Deleted

## 2011-01-13 NOTE — Telephone Encounter (Signed)
Called patient with results of echo from 01/08/2011.

## 2011-01-23 ENCOUNTER — Ambulatory Visit (INDEPENDENT_AMBULATORY_CARE_PROVIDER_SITE_OTHER): Payer: Medicare Other

## 2011-01-23 ENCOUNTER — Other Ambulatory Visit (INDEPENDENT_AMBULATORY_CARE_PROVIDER_SITE_OTHER): Payer: Medicare Other

## 2011-01-23 DIAGNOSIS — I6529 Occlusion and stenosis of unspecified carotid artery: Secondary | ICD-10-CM

## 2011-01-23 NOTE — Assessment & Plan Note (Signed)
OFFICE VISIT  Jessica, Burns DOB:  [birth year]                                       01/23/2011 NWGNF#:[id number]  CHIEF COMPLAINT:  Severe right carotid stenosis with occluded right external carotid artery.  Patient is a 77 year old woman with a history of hypertension and a former smoker who we have been following for her severe right internal carotid artery stenosis.  She has had a known right external carotid occlusion for several years.  She denies any symptoms of amaurosis, dysphagia, TIA, or stroke.  She states she does get some dizziness with raising her arms to get something off the shelf in her closet, which resolves quickly.  She has macular degeneration in both eyes.  She was put on some vitamins for her eyes, and she had some improved vision in the right eye.  She recently had an echocardiogram which showed some aortic stenosis. She is presently on an aspirin 81 mg daily.  MEDICATIONS:  Aspirin 81 mg daily, vitamin D and calcium, estrogen cream twice a week, lisinopril, Lipitor, and Norvasc daily.  DUPLEX:  Carotid duplex scan showed an occluded right external carotid artery, a peak systolic velocity of 360 on the right and 232 on the left.  She had an end-diastolic velocity of 94.  It was previously 101 on the right and 41 on the left.  This was read to be in the 60s to 79 range, but it was very calcific plaque.  CT scan done previously in 2010 showed a 70% to 90% right internal carotid artery stenosis and 60% on the left.  PHYSICAL EXAMINATION:  This is a thin white female in no acute distress. Her heart rate was 65.  Her sats were 98%.  Respiratory rate was 12. She had a mild right carotid bruit.  Left side she had a bruit but this referred from her cardiac murmur, heard best at the left sternal border. She had good and equal strength in bilateral upper and lower extremities.  She had no facial droop and no tongue  deviation.  ASSESSMENT/PLAN:  Assessment is critical right carotid stenosis. Appears to be unchanged from her duplex scan with some questionable symptoms with raising her arms over her head causing dizziness.  She had a significant drop in blood pressures with retrograde left vertebral suggestive of a right subclavian steal.  The patient will return in 3 months for further studies and evaluation by Dr. Darrick Burns at that time to see if any further evaluation of this severe right internal carotid artery stenosis is warranted.  She is also given instructions on the signs and symptoms as well as written information regarding the signs and symptoms of stroke.  [name], PA-C  Jessica Hertz, MD Electronically Signed  RR/MEDQ  D:  01/23/2011  T:  01/23/2011  Job:  [id number]

## 2011-02-02 NOTE — Procedures (Unsigned)
CAROTID DUPLEX EXAM  INDICATION:  Followup carotid stenosis.  HISTORY: Diabetes:  No. Cardiac:  No. Hypertension:  Yes. Smoking:  Previous. Previous Surgery:  No. CV History:  Asymptomatic. Amaurosis Fugax No, Paresthesias No, Hemiparesis No                                      RIGHT             LEFT Brachial systolic pressure:         130               160 Brachial Doppler waveforms:         Monophasic        Triphasic Vertebral direction of flow:        Antegrade         Retrograde DUPLEX VELOCITIES (cm/sec) CCA peak systolic                   30                116 ECA peak systolic                   Occluded          463 ICA peak systolic                   364               232 ICA end diastolic                   94                41 PLAQUE MORPHOLOGY:                  Calcified         Calcified PLAQUE AMOUNT:                      Moderate to severe                  Moderate PLAQUE LOCATION:                    CCA/ICA/ECA       CCA/ICA/ECA  IMPRESSION: 1. Right internal carotid artery stenosis in the 60%-79% range,     however, may be underestimated due to calcific plaque making     Doppler interrogation difficult. 2. Right external carotid artery known occlusion present. 3. Left internal carotid artery stenosis in the 40%-59% range,     however, may be underestimated due to calcific plaque making     Doppler interrogation difficult. 4. Left external carotid artery stenosis present. 5. Significant difference with a drop in brachial pressure on the     right with retrograde left vertebral artery flow suggestive of a     right subclavian steal. 6. Minimal changes present since previous study on 07/25/2010.       ___________________________________________ V. Charlena Cross, MD  SH/MEDQ  D:  01/23/2011  T:  01/23/2011  Job:  [id number]

## 2011-04-10 ENCOUNTER — Encounter: Payer: Self-pay | Admitting: Surgery

## 2011-05-04 ENCOUNTER — Ambulatory Visit: Payer: Medicare Other | Admitting: Surgery

## 2011-05-04 ENCOUNTER — Other Ambulatory Visit: Payer: Medicare Other

## 2011-05-21 ENCOUNTER — Ambulatory Visit: Payer: Medicare Other | Admitting: Vascular Surgery

## 2011-05-21 ENCOUNTER — Other Ambulatory Visit: Payer: Medicare Other

## 2011-06-05 LAB — COMPREHENSIVE METABOLIC PANEL
ALT: 14
AST: 21
Albumin: 3.7
Alkaline Phosphatase: 56
BUN: 12
CO2: 26
Calcium: 9.2
Chloride: 106
Creatinine, Ser: 0.89
GFR calc Af Amer: >60
GFR calc non Af Amer: >60
Glucose, Bld: 107 — ABNORMAL HIGH
Potassium: 4
Sodium: 137
Total Bilirubin: 0.8
Total Protein: 6.4

## 2011-06-05 LAB — CBC
HCT: 35.4 — ABNORMAL LOW
Hemoglobin: 9.3 — ABNORMAL LOW
MCV: 91.5
Platelets: 182
RDW: 13.7
WBC: 6.4

## 2011-06-05 LAB — APTT: aPTT: 29

## 2011-06-05 LAB — PROTIME-INR: INR: 0.9

## 2011-06-08 ENCOUNTER — Ambulatory Visit (INDEPENDENT_AMBULATORY_CARE_PROVIDER_SITE_OTHER): Payer: Medicare Other

## 2011-06-08 ENCOUNTER — Ambulatory Visit (INDEPENDENT_AMBULATORY_CARE_PROVIDER_SITE_OTHER): Payer: Medicare Other | Admitting: Surgery

## 2011-06-08 ENCOUNTER — Encounter: Payer: Self-pay | Admitting: Surgery

## 2011-06-08 VITALS — BP 174/54 | HR 60 | Resp 16 | Ht 67.0 in | Wt 141.0 lb

## 2011-06-08 DIAGNOSIS — I6529 Occlusion and stenosis of unspecified carotid artery: Secondary | ICD-10-CM

## 2011-06-08 DIAGNOSIS — I871 Compression of vein: Secondary | ICD-10-CM

## 2011-06-08 LAB — CBC
Hemoglobin: 12.2
RBC: 3.98

## 2011-06-08 LAB — COMPREHENSIVE METABOLIC PANEL
ALT: 15
Alkaline Phosphatase: 65
CO2: 28
Chloride: 104
GFR calc non Af Amer: 59 — ABNORMAL LOW
Glucose, Bld: 122 — ABNORMAL HIGH
Potassium: 4.1
Sodium: 137

## 2011-06-08 LAB — PROTIME-INR: Prothrombin Time: 13.2

## 2011-06-08 NOTE — Progress Notes (Signed)
[facility] [place]   Patient name: [name] MRN: [id number] DOB: 03-11-1931 Sex: female     Chief Complaint  Patient presents with  . Follow-up    carotid duplex exam    HISTORY OF PRESENT ILLNESS: The patient comes in today for followup of her carotid occlusive disease. I last saw her in December of 2010 however she followed up in our [facility] clinic in June of 2012 she has a known innominate artery stenosis and progressive right carotid stenosis. She remained asymptomatic. Specifically she denies numbness or weakness in either extremity she denies slurred speech he denies amaurosis fugax. She also has known aortic stenosis. She doesn't macular degeneration in both eyes was occasional affect her vision. She is medically managed for hypertension and hypercholesterolemia.  Past Medical History  Diagnosis Date  . Hypertension   . Hyperlipidemia   . Carotid artery occlusion   . Macular degeneration     In both eyes  . Arthritis   . Hiatal hernia     Past Surgical History  Procedure Date  . Cataract extraction   . Lumbar disc surgery   . Incontinence surgery   . Abdominal hysterectomy 1971  . Neck muscle surgery   . Varicose vein surgery     left leg    History   Social History  . Marital Status: Widowed    Spouse Name: N/A    Number of Children: N/A  . Years of Education: N/A   Occupational History  . Not on file.   Social History Main Topics  . Smoking status: Former Smoker    Quit date: 08/24/2009  . Smokeless tobacco: Not on file  . Alcohol Use: No  . Drug Use: No  . Sexually Active: Not on file   Other Topics Concern  . Not on file   Social History Narrative  . No narrative on file    History reviewed. No pertinent family history.  Allergies as of 06/08/2011 - Review Complete 06/08/2011  Allergen Reaction Noted  . Aspirin  04/10/2011  . Penicillins  08/02/2009    Current Outpatient Prescriptions on File Prior to Visit    Medication Sig Dispense Refill  . amLODipine (NORVASC) 10 MG tablet Take 10 mg by mouth daily.        Marland Kitchen aspirin 81 MG tablet Take 81 mg by mouth daily.        Marland Kitchen atorvastatin (LIPITOR) 10 MG tablet Take 10 mg by mouth daily.        . Calcium Citrate (CALCITRATE PO) Take by mouth. 2 tabs daily       . conjugated estrogens (PREMARIN) vaginal cream Place vaginally daily. Twice a week       . ibuprofen (ADVIL,MOTRIN) 200 MG tablet Take 200 mg by mouth every 6 (six) hours as needed.        Marland Kitchen lisinopril (PRINIVIL,ZESTRIL) 20 MG tablet Take 20 mg by mouth daily. 2 tabs daily       . VITAMIN D, CHOLECALCIFEROL, PO Take by mouth. 2 TABS DAILY (2000 UNITS DAILY)          REVIEW OF SYSTEMS: No changes since prior visit  PHYSICAL EXAMINATION: General: The patient appears their stated age.  Vital signs are BP 174/54  Pulse 60  Resp 16  Ht 5\' 7"  (1.702 m)  Wt 141 lb (63.957 kg)  BMI 22.08 kg/m2  SpO2 97% Pulmonary: There is a good air exchange bilaterally without wheezing or rales. Abdomen: Soft  and non-tender aorta not palpable Musculoskeletal: There are no major deformities.  There is no significant extremity pain. Neurologic: No focal weakness or paresthesias are detected, Skin: There are no ulcer or rashes noted. Psychiatric: The patient has normal affect. Cardiovascular: There is a regular rate and rhythm without significant murmur appreciated. 2+ left radial pulse, 1+ right radial pulse bilateral carotid bruits  Diagnostic Studies I ordered and reviewed her ultrasound that shows at least 50-79% right carotid stenosis, possibly greater due to severe stenosis at the common carotid origin and in the innominate artery. It is possible right external carotid artery occlusion and 40-59% left ICA stenosis  Assessment: Innominate and bilateral carotid stenosis, right greater than left Plan: In order to get a full of violation of her proximal occlusive disease I feel we need to proceed with  additional diagnostic imaging. Approximately 2 years ago we obtained good images from a CT scan. I think this need to be repeated. L. order a CT and exam of the head and neck as well as the chest to evaluate her innominate artery as well as bilateral carotid artery disease. Once he's had to perform  She will followup i my office V. Charlena Cross, M.D. [facility] [place] Office: [phone]

## 2011-06-09 NOTE — Progress Notes (Signed)
Addended by: Sharee Pimple on: 06/09/2011 10:39 AM   Modules accepted: Orders

## 2011-06-26 ENCOUNTER — Encounter: Payer: Self-pay | Admitting: Surgery

## 2011-06-29 ENCOUNTER — Ambulatory Visit
Admission: RE | Admit: 2011-06-29 | Discharge: 2011-06-29 | Disposition: A | Discharge status: Home / Self Care | Payer: Medicare Other | Source: Ambulatory Visit | Attending: Surgery | Admitting: Surgery

## 2011-06-29 ENCOUNTER — Ambulatory Visit (INDEPENDENT_AMBULATORY_CARE_PROVIDER_SITE_OTHER): Payer: Medicare Other | Admitting: Surgery

## 2011-06-29 ENCOUNTER — Encounter: Payer: Self-pay | Admitting: Surgery

## 2011-06-29 VITALS — BP 190/53 | HR 72 | Ht 67.0 in | Wt 140.0 lb

## 2011-06-29 DIAGNOSIS — I871 Compression of vein: Secondary | ICD-10-CM

## 2011-06-29 DIAGNOSIS — I6529 Occlusion and stenosis of unspecified carotid artery: Secondary | ICD-10-CM

## 2011-06-29 MED ORDER — IOHEXOL 350 MG/ML SOLN
100.0000 mL | Freq: Once | INTRAVENOUS | Status: AC | PRN
  Administered 2011-06-29: 100 mL via INTRAVENOUS

## 2011-06-29 MED ORDER — IOHEXOL 350 MG/ML SOLN
50.0000 mL | Freq: Once | INTRAVENOUS | Status: AC | PRN
  Administered 2011-06-29: 50 mL via INTRAVENOUS

## 2011-06-29 NOTE — Progress Notes (Signed)
The patient comes in today for review of her CT scan. Briefly, she has a known innominate stenosis in the setting of a right carotid stenosis. Her recent ultrasound suggested progression of disease. To get a better evaluation I sent her for a CT scan. I had done the same workup several years ago. She is back to discuss the CT scan results. Her CT shows that the innominate artery stenosis is 50% and the right carotid stenosis is 75% I spent approximately 45 minutes discussing and reviewing these results I would continue to recommend medical therapy for her disease. She does not endorse any symptoms at this time.  A thyroid nodule was seen on her CT scan I tried to set up a fine needle aspirate however upon further discussing this with the patient she seems to think that she had a fine-needle aspirate several years ago was a long she's going to the but end of this and get back in touch with me to see which direction we need to go in pursuing this.  Basal commence see me again in 6 months with a carotid ultrasound.

## 2011-07-06 ENCOUNTER — Encounter (INDEPENDENT_AMBULATORY_CARE_PROVIDER_SITE_OTHER): Payer: Medicare Other | Admitting: Ophthalmology

## 2011-07-06 DIAGNOSIS — H3530 Unspecified macular degeneration: Secondary | ICD-10-CM

## 2011-07-06 DIAGNOSIS — H353290 Exudative age-related macular degeneration, unspecified eye, stage unspecified: Secondary | ICD-10-CM

## 2011-07-06 DIAGNOSIS — H43819 Vitreous degeneration, unspecified eye: Secondary | ICD-10-CM

## 2011-07-06 NOTE — Procedures (Unsigned)
CAROTID DUPLEX EXAM  INDICATION:  Carotid artery stenosis.  HISTORY: Diabetes:  No. Cardiac:  No. Hypertension:  Yes. Smoking:  Previous. Previous Surgery:  None. CV History: Amaurosis Fugax No, Paresthesias No, Hemiparesis No.                                      RIGHT             LEFT Brachial systolic pressure: Brachial Doppler waveforms:         Triphasic         Triphasic Vertebral direction of flow:        Abnormal but antegrade waveforms    Antegrade DUPLEX VELOCITIES (cm/sec) CCA peak systolic                   23                105 ECA peak systolic                   18                467 ICA peak systolic                   303               203 ICA end diastolic                   103               34 PLAQUE MORPHOLOGY:                  Inhomogeneous, mostly calcified     Inhomogeneous, mostly calcified PLAQUE AMOUNT:                      Moderate-to-severe                  Moderate PLAQUE LOCATION:                    CCA, ICA, ECA     CCA, ICA, ECA.  DUPLEX:  Very turbulent flow is noted in the innominate artery. Elevated velocities are present at the origin of the right common carotid artery, 277 cm/s.  IMPRESSION: 1. At least 60% to 79% right internal carotid artery stenosis,     possibly 80% to 99%. Velocities may be higher being decreased by a     significant stenosis at the common carotid artery origin and in the     innominate artery. 2. Possible right external carotid artery occlusion with large     calcified shadowing plaque at the origin but very low flow in the     proximal segment. 3. 40% to 59% left internal carotid artery stenosis. 4. Significant stenosis of the left external carotid artery.       ___________________________________________ V. Charlena Cross, MD  CI/MEDQ  D:  06/08/2011  T:  06/08/2011  Job:  [id number]

## 2011-07-09 ENCOUNTER — Encounter (INDEPENDENT_AMBULATORY_CARE_PROVIDER_SITE_OTHER): Payer: Medicare Other | Admitting: Ophthalmology

## 2011-07-09 DIAGNOSIS — H353290 Exudative age-related macular degeneration, unspecified eye, stage unspecified: Secondary | ICD-10-CM

## 2011-07-09 DIAGNOSIS — H3530 Unspecified macular degeneration: Secondary | ICD-10-CM

## 2011-07-09 DIAGNOSIS — H43819 Vitreous degeneration, unspecified eye: Secondary | ICD-10-CM

## 2011-08-04 ENCOUNTER — Encounter (INDEPENDENT_AMBULATORY_CARE_PROVIDER_SITE_OTHER): Payer: Medicare Other | Admitting: Ophthalmology

## 2011-08-04 DIAGNOSIS — H3530 Unspecified macular degeneration: Secondary | ICD-10-CM

## 2011-08-04 DIAGNOSIS — H43819 Vitreous degeneration, unspecified eye: Secondary | ICD-10-CM

## 2011-08-04 DIAGNOSIS — H353290 Exudative age-related macular degeneration, unspecified eye, stage unspecified: Secondary | ICD-10-CM

## 2011-09-04 ENCOUNTER — Encounter (INDEPENDENT_AMBULATORY_CARE_PROVIDER_SITE_OTHER): Payer: Medicare Other | Admitting: Ophthalmology

## 2011-09-11 ENCOUNTER — Encounter (INDEPENDENT_AMBULATORY_CARE_PROVIDER_SITE_OTHER): Payer: Medicare Other | Admitting: Ophthalmology

## 2011-09-11 DIAGNOSIS — H3530 Unspecified macular degeneration: Secondary | ICD-10-CM

## 2011-09-11 DIAGNOSIS — H353290 Exudative age-related macular degeneration, unspecified eye, stage unspecified: Secondary | ICD-10-CM

## 2011-09-11 DIAGNOSIS — H43819 Vitreous degeneration, unspecified eye: Secondary | ICD-10-CM

## 2011-10-05 ENCOUNTER — Encounter: Payer: Self-pay | Admitting: Internal Medicine

## 2011-10-09 ENCOUNTER — Encounter (INDEPENDENT_AMBULATORY_CARE_PROVIDER_SITE_OTHER): Payer: Medicare Other | Admitting: Ophthalmology

## 2011-10-09 DIAGNOSIS — H43819 Vitreous degeneration, unspecified eye: Secondary | ICD-10-CM

## 2011-10-09 DIAGNOSIS — H3530 Unspecified macular degeneration: Secondary | ICD-10-CM

## 2011-10-09 DIAGNOSIS — H35039 Hypertensive retinopathy, unspecified eye: Secondary | ICD-10-CM

## 2011-10-09 DIAGNOSIS — I10 Essential (primary) hypertension: Secondary | ICD-10-CM

## 2011-10-09 DIAGNOSIS — H353290 Exudative age-related macular degeneration, unspecified eye, stage unspecified: Secondary | ICD-10-CM

## 2011-11-13 ENCOUNTER — Encounter (INDEPENDENT_AMBULATORY_CARE_PROVIDER_SITE_OTHER): Payer: Medicare Other | Admitting: Ophthalmology

## 2011-11-13 DIAGNOSIS — I10 Essential (primary) hypertension: Secondary | ICD-10-CM

## 2011-11-13 DIAGNOSIS — H3530 Unspecified macular degeneration: Secondary | ICD-10-CM

## 2011-11-13 DIAGNOSIS — H43819 Vitreous degeneration, unspecified eye: Secondary | ICD-10-CM

## 2011-11-13 DIAGNOSIS — H353290 Exudative age-related macular degeneration, unspecified eye, stage unspecified: Secondary | ICD-10-CM

## 2011-11-13 DIAGNOSIS — H35039 Hypertensive retinopathy, unspecified eye: Secondary | ICD-10-CM

## 2011-12-18 ENCOUNTER — Encounter (INDEPENDENT_AMBULATORY_CARE_PROVIDER_SITE_OTHER): Payer: Medicare Other | Admitting: Ophthalmology

## 2011-12-21 ENCOUNTER — Encounter (INDEPENDENT_AMBULATORY_CARE_PROVIDER_SITE_OTHER): Payer: Medicare Other | Admitting: Ophthalmology

## 2011-12-21 DIAGNOSIS — H3530 Unspecified macular degeneration: Secondary | ICD-10-CM

## 2011-12-21 DIAGNOSIS — H35039 Hypertensive retinopathy, unspecified eye: Secondary | ICD-10-CM

## 2011-12-21 DIAGNOSIS — H353290 Exudative age-related macular degeneration, unspecified eye, stage unspecified: Secondary | ICD-10-CM

## 2011-12-21 DIAGNOSIS — I10 Essential (primary) hypertension: Secondary | ICD-10-CM

## 2011-12-21 DIAGNOSIS — H4320 Crystalline deposits in vitreous body, unspecified eye: Secondary | ICD-10-CM

## 2012-01-20 ENCOUNTER — Encounter (INDEPENDENT_AMBULATORY_CARE_PROVIDER_SITE_OTHER): Payer: Medicare Other | Admitting: Ophthalmology

## 2012-01-20 DIAGNOSIS — H353290 Exudative age-related macular degeneration, unspecified eye, stage unspecified: Secondary | ICD-10-CM

## 2012-01-20 DIAGNOSIS — H43819 Vitreous degeneration, unspecified eye: Secondary | ICD-10-CM

## 2012-01-20 DIAGNOSIS — H3530 Unspecified macular degeneration: Secondary | ICD-10-CM

## 2012-01-20 DIAGNOSIS — I10 Essential (primary) hypertension: Secondary | ICD-10-CM

## 2012-01-20 DIAGNOSIS — H35039 Hypertensive retinopathy, unspecified eye: Secondary | ICD-10-CM

## 2012-01-25 ENCOUNTER — Other Ambulatory Visit: Payer: Medicare Other

## 2012-01-25 ENCOUNTER — Ambulatory Visit: Payer: Medicare Other | Admitting: Surgery

## 2012-02-08 ENCOUNTER — Ambulatory Visit (INDEPENDENT_AMBULATORY_CARE_PROVIDER_SITE_OTHER): Payer: Medicare Other | Admitting: Internal Medicine

## 2012-02-08 ENCOUNTER — Telehealth: Payer: Self-pay | Admitting: Internal Medicine

## 2012-02-08 ENCOUNTER — Encounter: Payer: Self-pay | Admitting: Internal Medicine

## 2012-02-08 VITALS — BP 160/67 | HR 65 | Ht 67.0 in | Wt 141.0 lb

## 2012-02-08 DIAGNOSIS — I10 Essential (primary) hypertension: Secondary | ICD-10-CM

## 2012-02-08 MED ORDER — HYDROCHLOROTHIAZIDE 12.5 MG PO CAPS: 12.5000 mg | ORAL_CAPSULE | Freq: Every day | ORAL | Status: DC

## 2012-02-08 NOTE — Progress Notes (Signed)
HPI Patient is a 78 year old with a history of HTN, CV disease, dyslipidemia.  I saw her in April 2012.  Office visits since tjhen, BP has been elevated. Since seen she denies SOB  No CP.  Occas weaknes in legs  Took meds today. Denies CP.  Some dizziness when puts head way back..  No syncope or presyncope. Breathing is OK Allergies  Allergen Reactions  . Aspirin   . Penicillins     Current Outpatient Prescriptions  Medication Sig Dispense Refill  . amLODipine (NORVASC) 10 MG tablet Take 10 mg by mouth daily.        Marland Kitchen aspirin 81 MG tablet Take 81 mg by mouth daily.        Marland Kitchen atorvastatin (LIPITOR) 10 MG tablet Take 10 mg by mouth daily.        . Calcium Citrate (CALCITRATE PO) Take by mouth. 2 tabs daily       . conjugated estrogens (PREMARIN) vaginal cream Place vaginally daily. Twice a week       . ibuprofen (ADVIL,MOTRIN) 200 MG tablet Take 200 mg by mouth every 6 (six) hours as needed.        Marland Kitchen lisinopril (PRINIVIL,ZESTRIL) 20 MG tablet Take 20 mg by mouth daily. 2 tabs daily       . VITAMIN D, CHOLECALCIFEROL, PO Take by mouth. 2 TABS DAILY (2000 UNITS DAILY)         Past Medical History  Diagnosis Date  . Hypertension   . Hyperlipidemia   . Carotid artery occlusion   . Macular degeneration     In both eyes  . Arthritis   . Hiatal hernia     Past Surgical History  Procedure Date  . Cataract extraction   . Lumbar disc surgery   . Incontinence surgery   . Abdominal hysterectomy 1971  . Neck muscle surgery   . Varicose vein surgery     left leg    No family history on file.  History   Social History  . Marital Status: Widowed    Spouse Name: N/A    Number of Children: N/A  . Years of Education: N/A   Occupational History  . Not on file.   Social History Main Topics  . Smoking status: Former Smoker    Quit date: 08/24/2009  . Smokeless tobacco: Not on file  . Alcohol Use: No  . Drug Use: No  . Sexually Active: Not on file   Other Topics Concern  .  Not on file   Social History Narrative  . No narrative on file    Review of Systems:  All systems reviewed.  They are negative to the above problem except as previously stated.  Vital Signs: BP 160/67  Pulse 65  Ht 5\' 7"  (1.702 m)  Wt 141 lb (63.957 kg)  BMI 22.08 kg/m2  Physical Exam Patient is in NAD  BP 160/60 L arm. HEENT:  Normocephalic, atraumatic. EOMI, PERRLA.  Neck: JVP is normal. No thyromegaly. Positive for bruits. Lungs: clear to auscultation. No rales no wheezes.  Heart: Regular rate and rhythm. Normal S1, S2. No S3.   No significant murmurs. PMI not displaced.  Abdomen:  Supple, nontender. Normal bowel sounds. No masses. No hepatomegaly.  Extremities:   Good distal pulses throughout. No lower extremity edema.  Musculoskeletal :moving all extremities.  Neuro:   alert and oriented x3.  CN II-XII grossly intact.  EKG:  NSR.  65  LVH with repol abnormality.  Assessment and Plan:  1.  HTN  BP is high.  I would recomm HCTZ 12.5 mg.  F/U BMET in 10 days.  Follow up for BP check in 4 to 6 wks.  Continue other meds. 2.  CV disease.  Continue meds  Followed in vascular clinic 3.  HL.  Continue statin.  WIll need to get records from Dr. Buel Ream office.

## 2012-02-08 NOTE — Patient Instructions (Signed)
New medication HCTZ 12.5 mg every day  Lab work at Dr.Moore's offfice in 10 days  Return to see Dr.Ross or Tereso Newcomer in 4 to 6 weeks.

## 2012-02-08 NOTE — Telephone Encounter (Signed)
Called patient's daughter back. She wanted to know if she should take HCTZ in the Am or PM. I advised her to have her Mom take medication in the Am.

## 2012-02-08 NOTE — Telephone Encounter (Signed)
Please return call to patient daughter regarding medication clarification, Daughter [name] can be reached at [phone].

## 2012-02-12 ENCOUNTER — Encounter: Payer: Self-pay | Admitting: Neurosurgery

## 2012-02-15 ENCOUNTER — Encounter: Payer: Self-pay | Admitting: Neurosurgery

## 2012-02-15 ENCOUNTER — Ambulatory Visit (INDEPENDENT_AMBULATORY_CARE_PROVIDER_SITE_OTHER): Payer: Medicare Other | Admitting: Surgery

## 2012-02-15 ENCOUNTER — Other Ambulatory Visit (INDEPENDENT_AMBULATORY_CARE_PROVIDER_SITE_OTHER): Payer: Medicare Other | Admitting: *Deleted

## 2012-02-15 VITALS — BP 161/67 | HR 57 | Resp 16 | Ht 67.0 in | Wt 141.0 lb

## 2012-02-15 DIAGNOSIS — I6529 Occlusion and stenosis of unspecified carotid artery: Secondary | ICD-10-CM

## 2012-02-15 NOTE — Progress Notes (Signed)
[facility] [place]   Patient name: [name] MRN: [id number] DOB: 1931/04/14 Sex: female     Chief Complaint  Patient presents with  . Carotid    6 month f/up with labs. Some dizziness off/on.    HISTORY OF PRESENT ILLNESS: The patient is back today for followup of her carotid occlusive disease and innominate stenosis. She remains asymptomatic. Specifically she denies numbness or weakness in either extremity. She denies slurred speech. She denies amaurosis fugax.  Past Medical History  Diagnosis Date  . Hypertension   . Hyperlipidemia   . Carotid artery occlusion   . Macular degeneration     In both eyes  . Arthritis   . Hiatal hernia     Past Surgical History  Procedure Date  . Cataract extraction   . Lumbar disc surgery   . Incontinence surgery   . Abdominal hysterectomy 1971  . Neck muscle surgery   . Varicose vein surgery     left leg    History   Social History  . Marital Status: Widowed    Spouse Name: N/A    Number of Children: N/A  . Years of Education: N/A   Occupational History  . Not on file.   Social History Main Topics  . Smoking status: Former Smoker    Quit date: 08/24/2009  . Smokeless tobacco: Not on file  . Alcohol Use: No  . Drug Use: No  . Sexually Active: Not on file   Other Topics Concern  . Not on file   Social History Narrative  . No narrative on file    No family history on file.  Allergies as of 02/15/2012 - Review Complete 02/15/2012  Allergen Reaction Noted  . Aspirin  04/10/2011  . Penicillins  08/02/2009    Current Outpatient Prescriptions on File Prior to Visit  Medication Sig Dispense Refill  . amLODipine (NORVASC) 10 MG tablet Take 10 mg by mouth daily.        Marland Kitchen aspirin 81 MG tablet Take 81 mg by mouth daily.        Marland Kitchen atorvastatin (LIPITOR) 10 MG tablet Take 10 mg by mouth daily.        . Calcium Citrate (CALCITRATE PO) Take by mouth. 2 tabs daily       . conjugated estrogens  (PREMARIN) vaginal cream Place vaginally daily. Twice a week       . hydrochlorothiazide (MICROZIDE) 12.5 MG capsule Take 1 capsule (12.5 mg total) by mouth daily.  30 capsule  6  . ibuprofen (ADVIL,MOTRIN) 200 MG tablet Take 200 mg by mouth every 6 (six) hours as needed.        Marland Kitchen lisinopril (PRINIVIL,ZESTRIL) 20 MG tablet Take 40 mg by mouth daily. 2 tabs daily      . VITAMIN D, CHOLECALCIFEROL, PO Take by mouth. 2 TABS DAILY (2000 UNITS DAILY)          REVIEW OF SYSTEMS: Positive for history of DVT and varicose veins. Positive for weakness in arms and dizziness. Although systems are negative.   PHYSICAL EXAMINATION:   Vital signs are BP 161/67  Pulse 57  Resp 16  Ht 5\' 7"  (1.702 m)  Wt 141 lb (63.957 kg)  BMI 22.08 kg/m2  SpO2 98% General: The patient appears their stated age. HEENT:  No gross abnormalities Pulmonary:  Non labored breathing Musculoskeletal: There are no major deformities. Neurologic: No focal weakness or paresthesias are detected, Skin: There are no ulcer or  rashes noted. Psychiatric: The patient has normal affect. Cardiovascular: There is a regular rate and rhythm without significant murmur appreciated. Palpable radial pulse bilaterally mild carotid bruits bilaterally   Diagnostic Studies I have ordered and reviewed her duplex ultrasound. This shows 60-79% right carotid stenosis and 40-59% left carotid stenosis. There appears to be a slight increase in the innominate artery stenosis.  Assessment: Asymptomatic carotid and innominate stenosis Plan: I discussed with the patient and her daughter that as long as she remains asymptomatic and in the left and 80% category out recommend continued medical management and not proceed with further surgical evaluation. I'm not sure based on her previous imaging studies if treating the innominate artery stenosis at the time of carotid endarterectomy I would be possible. She could potentially require a sternotomy to address  her innominate artery stenosis. For that reason we are all in agreement that continued surveillance this manner to treat this at this time. She will see me in 6 months with a repeat ultrasound.  Jorge Ny, M.D. [facility] [place] Office: [phone] Pager:  [phone]

## 2012-02-22 NOTE — Procedures (Unsigned)
CAROTID DUPLEX EXAM  INDICATION:  Followup carotid disease, known occluded right ECA.  HISTORY: Diabetes:  No Cardiac:  No Hypertension:  Yes Smoking:  Previous Previous Surgery:  None CV History: Amaurosis Fugax No, Paresthesias No, Hemiparesis No                                      RIGHT             LEFT Brachial systolic pressure: Brachial Doppler waveforms: Vertebral direction of flow:        Abnormal          Abnormal and antegrade DUPLEX VELOCITIES (cm/sec) CCA peak systolic                   22                88 ECA peak systolic                   Occluded          326 ICA peak systolic                   245               266 ICA end diastolic                   82                50 PLAQUE MORPHOLOGY:                  Calcific          Calcific PLAQUE AMOUNT:                      Severe            Moderate PLAQUE LOCATION:                    CCE, ECA, ICA, innominate, subclavian                          CCE, ECA, ICA  IMPRESSION: 1. Moderate to severe innominate stenosis with a velocity of 383     cm/sec. 2. Velocity suggests 60% to 79% right internal carotid artery     stenosis; however, plaque is calcific, which may underestimate     recorded velocities.  Disease appears worse than this category.  In     addition, velocities have decreased since prior exam, which may     indicate a disease progression.  Wave forms throughout the internal     carotid artery are markedly abnormal. 3. Occluded right external carotid artery. 4. Normal right vertebral artery with significant subclavian stenosis     observed. 5. A 40% to 59% left internal carotid artery stenosis. 6. Significant left external carotid artery stenosis is observed. 7. Mild diffuse disease is observed in the left common carotid artery.  ___________________________________________ V. Charlena Cross, MD  LT/MEDQ  D:  02/16/2012  T:  02/16/2012  Job:  [id number]

## 2012-02-24 ENCOUNTER — Encounter (INDEPENDENT_AMBULATORY_CARE_PROVIDER_SITE_OTHER): Payer: Medicare Other | Admitting: Ophthalmology

## 2012-02-24 DIAGNOSIS — I10 Essential (primary) hypertension: Secondary | ICD-10-CM

## 2012-02-24 DIAGNOSIS — H353290 Exudative age-related macular degeneration, unspecified eye, stage unspecified: Secondary | ICD-10-CM

## 2012-02-24 DIAGNOSIS — H43819 Vitreous degeneration, unspecified eye: Secondary | ICD-10-CM

## 2012-02-24 DIAGNOSIS — H318 Other specified disorders of choroid: Secondary | ICD-10-CM

## 2012-02-24 DIAGNOSIS — H3560 Retinal hemorrhage, unspecified eye: Secondary | ICD-10-CM

## 2012-02-24 DIAGNOSIS — H35039 Hypertensive retinopathy, unspecified eye: Secondary | ICD-10-CM

## 2012-02-24 DIAGNOSIS — H3530 Unspecified macular degeneration: Secondary | ICD-10-CM

## 2012-02-29 ENCOUNTER — Encounter: Payer: Self-pay | Admitting: Internal Medicine

## 2012-03-03 ENCOUNTER — Telehealth: Payer: Self-pay | Admitting: *Deleted

## 2012-03-03 NOTE — Telephone Encounter (Signed)
Called patient's daughter with lab results from [facility]. LMOM that she needs a repeat BMET in 3 weeks and asked if she would like to have labs done here or at primary care doctors office.

## 2012-03-04 NOTE — Telephone Encounter (Signed)
Fu msg Pt's daughter calling back

## 2012-03-04 NOTE — Telephone Encounter (Signed)
Will still keep her appointment with Tereso Newcomer on Tues for BP check.  She will have her repeat BMP in 3 weeks at [facility].  If she needs an order will pick up on Tues at her appointment with the PA

## 2012-03-08 ENCOUNTER — Ambulatory Visit: Payer: Medicare Other | Admitting: Physician Assistant

## 2012-03-16 ENCOUNTER — Ambulatory Visit (INDEPENDENT_AMBULATORY_CARE_PROVIDER_SITE_OTHER): Payer: Medicare Other | Admitting: Physician Assistant

## 2012-03-16 ENCOUNTER — Encounter: Payer: Self-pay | Admitting: Physician Assistant

## 2012-03-16 VITALS — BP 156/64 | HR 68 | Ht 67.0 in | Wt 139.1 lb

## 2012-03-16 DIAGNOSIS — I6529 Occlusion and stenosis of unspecified carotid artery: Secondary | ICD-10-CM

## 2012-03-16 DIAGNOSIS — I10 Essential (primary) hypertension: Secondary | ICD-10-CM

## 2012-03-16 DIAGNOSIS — R011 Cardiac murmur, unspecified: Secondary | ICD-10-CM

## 2012-03-16 MED ORDER — HYDROCHLOROTHIAZIDE 25 MG PO TABS: 25.0000 mg | ORAL_TABLET | Freq: Every day | ORAL | Status: DC

## 2012-03-16 NOTE — Assessment & Plan Note (Signed)
2-D echo in 12/2010 showed patent foramen ovale and mild MR.

## 2012-03-16 NOTE — Patient Instructions (Addendum)
Your physician recommends that you schedule a follow-up appointment in 2 months with Dietrich Pates, MD.  Inrease HCTZ to 25mg  daily.  2 Gram Low Sodium Diet A 2 gram sodium diet restricts the amount of sodium in the diet to no more than 2 g or 2000 mg daily. Limiting the amount of sodium is often used to help lower blood pressure. It is important if you have heart, liver, or kidney problems. Many foods contain sodium for flavor and sometimes as a preservative. When the amount of sodium in a diet needs to be low, it is important to know what to look for when choosing foods and drinks. The following includes some information and guidelines to help make it easier for you to adapt to a low sodium diet. QUICK TIPS  Do not add salt to food.   Avoid convenience items and fast food.   Choose unsalted snack foods.   Buy lower sodium products, often labeled as "lower sodium" or "no salt added."   Check food labels to learn how much sodium is in 1 serving.   When eating at a restaurant, ask that your food be prepared with less salt or none, if possible.  READING FOOD LABELS FOR SODIUM INFORMATION The nutrition facts label is a good place to find how much sodium is in foods. Look for products with no more than 500 to 600 mg of sodium per meal and no more than 150 mg per serving. Remember that 2 g = 2000 mg. The food label may also list foods as:  Sodium-free: Less than 5 mg in a serving.   Very low sodium: 35 mg or less in a serving.   Low-sodium: 140 mg or less in a serving.   Light in sodium: 50% less sodium in a serving. For example, if a food that usually has 300 mg of sodium is changed to become light in sodium, it will have 150 mg of sodium.   Reduced sodium: 25% less sodium in a serving. For example, if a food that usually has 400 mg of sodium is changed to reduced sodium, it will have 300 mg of sodium.  CHOOSING FOODS Grains  Avoid: Salted crackers and snack items. Some cereals,  including instant hot cereals. Bread stuffing and biscuit mixes. Seasoned rice or pasta mixes.   Choose: Unsalted snack items. Low-sodium cereals, oats, puffed wheat and rice, shredded wheat. English muffins and bread. Pasta.  Meats  Avoid: Salted, canned, smoked, spiced, pickled meats, including fish and poultry. Bacon, ham, sausage, cold cuts, hot dogs, anchovies.   Choose: Low-sodium canned tuna and salmon. Fresh or frozen meat, poultry, and fish.  Dairy  Avoid: Processed cheese and spreads. Cottage cheese. Buttermilk and condensed milk. Regular cheese.   Choose: Milk. Low-sodium cottage cheese. Yogurt. Sour cream. Low-sodium cheese.  Fruits and Vegetables  Avoid: Regular canned vegetables. Regular canned tomato sauce and paste. Frozen vegetables in sauces. Olives. Rosita Fire. Relishes. Sauerkraut.   Choose: Low-sodium canned vegetables. Low-sodium tomato sauce and paste. Frozen or fresh vegetables. Fresh and frozen fruit.  Condiments  Avoid: Canned and packaged gravies. Worcestershire sauce. Tartar sauce. Barbecue sauce. Soy sauce. Steak sauce. Ketchup. Onion, garlic, and table salt. Meat flavorings and tenderizers.   Choose: Fresh and dried herbs and spices. Low-sodium varieties of mustard and ketchup. Lemon juice. Tabasco sauce. Horseradish.  SAMPLE 2 GRAM SODIUM MEAL PLAN Breakfast / Sodium (mg)  1 cup low-fat milk / 143 mg   2 slices whole-wheat toast / 270 mg  1 tbs heart-healthy margarine / 153 mg   1 hard-boiled egg / 139 mg   1 small orange / 0 mg  Lunch / Sodium (mg)  1 cup raw carrots / 76 mg    cup hummus / 298 mg   1 cup low-fat milk / 143 mg    cup red grapes / 2 mg   1 whole-wheat pita bread / 356 mg  Dinner / Sodium (mg)  1 cup whole-wheat pasta / 2 mg   1 cup low-sodium tomato sauce / 73 mg   3 oz lean ground beef / 57 mg   1 small side salad (1 cup raw spinach leaves,  cup cucumber,  cup yellow bell pepper) with 1 tsp olive oil and 1 tsp  red wine vinegar / 25 mg  Snack / Sodium (mg)  1 container low-fat vanilla yogurt / 107 mg   3 graham cracker squares / 127 mg  Nutrient Analysis  Calories: 2033   Protein: 77 g   Carbohydrate: 282 g   Fat: 72 g   Sodium: 1971 mg  Document Released: 08/10/2005 Document Revised: 07/30/2011 Document Reviewed: 11/11/2009 [facility] Patient Information 2012 [place], [place].

## 2012-03-16 NOTE — Progress Notes (Signed)
HPI:  This is a 78 year old female patient who is here for followup blood pressure check. She has history of hypertension and was recently started on hydrochlorothiazide by Dr. Tenny Craw. In speaking with her closer she does have variance in her blood pressure between her left or right arm. She is being followed by Dr. Lala Lund Brabham IV, for carotid occlusive disease and an innominate stenosis. Her right carotid is 60-79% stenosed and her left carotid is 40-59% stenosed. There appears to be a slight increase in the innominate artery stenosis. At this point he wants to continue to follow her medically as he feels she could potentially require sternotomy to address her in the mid artery stenosis. She will have a repeat ultrasound in December.  The patient says her blood pressure has been doing a little bit better since she started the hydrochlorothiazide but it is always a little higher in the left arm. She does admit to eating a lot of frozen Stouffer meals and may be getting extra salt in her diet this way.  Allergies:  -- Aspirin   -- Penicillins   Current Outpatient Prescriptions on File Prior to Visit: amLODipine (NORVASC) 10 MG tablet, Take 10 mg by mouth daily.  , Disp: , Rfl:  aspirin 81 MG tablet, Take 81 mg by mouth daily.  , Disp: , Rfl:  atorvastatin (LIPITOR) 10 MG tablet, Take 10 mg by mouth daily.  , Disp: , Rfl:  Calcium Citrate (CALCITRATE PO), Take by mouth. 2 tabs daily , Disp: , Rfl:   conjugated estrogens (PREMARIN) vaginal cream, Place vaginally daily. Twice a week , Disp: , Rfl:  hydrochlorothiazide (MICROZIDE) 12.5 MG capsule, Take 1 capsule (12.5 mg total) by mouth daily., Disp: 30 capsule, Rfl: 6 ibuprofen (ADVIL,MOTRIN) 200 MG tablet, Take 200 mg by mouth every 6 (six) hours as needed.  , Disp: , Rfl:  lisinopril (PRINIVIL,ZESTRIL) 20 MG tablet, Take 40 mg by mouth daily. 2 tabs daily, Disp: , Rfl:  Multiple Vitamins-Minerals (PRESERVISION AREDS) CAPS, Take by mouth daily.,  Disp: , Rfl:  VITAMIN D, CHOLECALCIFEROL, PO, Take by mouth. 2 TABS DAILY (2000 UNITS DAILY) , Disp: , Rfl:     Past Medical History:   Hypertension                                                 Hyperlipidemia                                               Carotid artery occlusion                                     Macular degeneration                                           Comment:In both eyes   Arthritis  Hiatal hernia                                               Past Surgical History:   CATARACT EXTRACTION                                          LUMBAR DISC SURGERY                                          INCONTINENCE SURGERY                                         ABDOMINAL HYSTERECTOMY                          1971         Neck Muscle Surgery                                          VARICOSE VEIN SURGERY                                          Comment:left leg  No family history on file.   Social History   Marital Status: Widowed             Spouse Name:                      Years of Education:                 Number of children:             Occupational History   None on file  Social History Main Topics   Smoking Status: Former Smoker                   Packs/Day:       Years:           Quit date: 08/24/2009   Smokeless Status: Never Used                       Alcohol Use: No             Drug Use: No             Sexual Activity: Not on file        Other Topics            Concern   None on file  Social History Narrative   None on file    ROS:   PHYSICAL EXAM: Well-nournished, in no acute distress. Neck: bilateral carotid bruits,No JVD, HJR,  or thyroid enlargement  Lungs: No tachypnea, clear without wheezing, rales, or rhonchi  Cardiovascular: RRR, PMI not displaced,2-3/6 harsh systolic murmur in the left sternal border, right sternal border, no gallops, bruit,  thrill, or heave.  Abdomen: BS  normal. Soft without organomegaly, masses, lesions or tenderness.  Extremities: without cyanosis, clubbing or edema. Good distal pulses bilateral  SKin: Warm, no lesions or rashes   Musculoskeletal: No deformities  Neuro: no focal signs  BP 156/64  Pulse 68  Ht 5\' 7"  (1.702 m)  Wt 139 lb 1.9 oz (63.104 kg)  BMI 21.79 kg/m2

## 2012-03-16 NOTE — Assessment & Plan Note (Signed)
To have followup carotids in December. Being followed closely by Dr.Brabham IV.

## 2012-03-16 NOTE — Assessment & Plan Note (Signed)
Patient's blood pressure is higher in the left and normal on the right. It is felt he could be secondary to her innominant stenosis.we will increase her hydrochlorothiazide to 25 mg daily and have given her 2 g sodium diet to follow. She will follow up with Dr. Dietrich Pates in 2 months.

## 2012-03-21 ENCOUNTER — Encounter: Payer: Self-pay | Admitting: Internal Medicine

## 2012-03-22 ENCOUNTER — Encounter: Payer: Self-pay | Admitting: Internal Medicine

## 2012-03-22 ENCOUNTER — Telehealth: Payer: Self-pay | Admitting: Internal Medicine

## 2012-03-22 NOTE — Telephone Encounter (Signed)
New msg Pt's daughter called about sodium level and her meds. Please call

## 2012-03-22 NOTE — Telephone Encounter (Signed)
I spoke with the patient's daughter. She states that when the patient saw Marcelino Duster last week, her HCTZ was increased to 25 mg daily. She was given information for a 2 gram sodium diet and encouraged to watch her sodium intake. The patient had a repeat bmp yesterday with her PCP (this is scanned in EPIC), and her she was informed that her sodium level was low. The reading on her sodium from yesterday was 127. Per the patient's daughter, her PCP is having her come back today for a repeat lab draw to reassess her sodium level. I explained to the patient's daughter that the reading from yesterday is slightly below normal, but not bad. I advised I would forward this message on to Dr. Tenny Craw, but have encouraged that they monitor the patient's BP at home, which they have not done. The patient is confused now if she needs to eat more or less sodium. I advised no changes, but will review with Dr. Tenny Craw.

## 2012-03-24 NOTE — Telephone Encounter (Signed)
See note from 8/1 attached to lab.

## 2012-03-25 ENCOUNTER — Telehealth: Payer: Self-pay | Admitting: Internal Medicine

## 2012-03-25 NOTE — Telephone Encounter (Addendum)
Spoke with [name] (patient's daughter) Concerned about her Mom's BP. HCTZ was stopped yesterday and started on Clonidine 0.1 mg 3 times per day. Had 2 doses yesterday and 1 dose today. HR low 50's. BP noted below. Patient feels washed out. Advised not to give her any more Clonidine today and will discuss with Dr.Ross and call back.  Discussed with Dr.Ross and she advised that she stop the Clonidine and monitor BP. If BP lower than 100/50 or higher than 150/90 she will call the on call person over the weekend for instructions. She plans on taking her for a bmet on Monday at  [facility] office. Advised that she encourage food and milk and juice. If she wants to have bmet in this office she will call us and let us know.

## 2012-03-25 NOTE — Telephone Encounter (Signed)
Pt is on a new med, clonidine, has only three pills,  and having some weakness and dizziness since taking it, BP low @ 1030am 64/38 r arm, 105/39 in l arm, pls call dtr heidi  [phone]

## 2012-03-28 ENCOUNTER — Telehealth: Payer: Self-pay | Admitting: Internal Medicine

## 2012-03-28 NOTE — Telephone Encounter (Signed)
New msg Pt's daughter called about her coming in for blood work and discuss BP readings

## 2012-03-28 NOTE — Telephone Encounter (Signed)
LMOM for call back. 

## 2012-03-30 ENCOUNTER — Encounter (INDEPENDENT_AMBULATORY_CARE_PROVIDER_SITE_OTHER): Payer: Medicare Other | Admitting: Ophthalmology

## 2012-03-30 DIAGNOSIS — H3530 Unspecified macular degeneration: Secondary | ICD-10-CM

## 2012-03-30 DIAGNOSIS — H353290 Exudative age-related macular degeneration, unspecified eye, stage unspecified: Secondary | ICD-10-CM

## 2012-03-30 DIAGNOSIS — H43819 Vitreous degeneration, unspecified eye: Secondary | ICD-10-CM

## 2012-04-04 NOTE — Telephone Encounter (Signed)
LMOM for call back for Heidi (patient's daughter) Have not received any current lab work on this patient yet and any current BP readings.

## 2012-04-06 NOTE — Telephone Encounter (Signed)
PT'S daughter returning call to [facility] concerning lab work and BP readings --per daughter (heidi)--pt did not have any labs done and they have not checked BP as she has been taken off the meds that caused the problem and she is back on her old med regime?--heidi would like to know "what is next step"--advised i would pass message along--daughter agrees--nt

## 2012-04-06 NOTE — Telephone Encounter (Signed)
F/u  Patient daughter calling back to speak with Annice Pih, aware she is off today.

## 2012-04-07 NOTE — Telephone Encounter (Signed)
Called patient's daughter. She states that her Mom is feeling well. Never had repeat labs done. BP running 150/58 to 160/60. Will have repeat BMET at PCP' Office and forward to Dr.Ross.

## 2012-04-10 NOTE — Telephone Encounter (Signed)
Will wait for labs.

## 2012-05-12 ENCOUNTER — Encounter (INDEPENDENT_AMBULATORY_CARE_PROVIDER_SITE_OTHER): Payer: Medicare Other | Admitting: Ophthalmology

## 2012-05-12 DIAGNOSIS — H35039 Hypertensive retinopathy, unspecified eye: Secondary | ICD-10-CM

## 2012-05-12 DIAGNOSIS — H43819 Vitreous degeneration, unspecified eye: Secondary | ICD-10-CM

## 2012-05-12 DIAGNOSIS — H353290 Exudative age-related macular degeneration, unspecified eye, stage unspecified: Secondary | ICD-10-CM

## 2012-05-12 DIAGNOSIS — H3530 Unspecified macular degeneration: Secondary | ICD-10-CM

## 2012-05-12 DIAGNOSIS — I10 Essential (primary) hypertension: Secondary | ICD-10-CM

## 2012-05-13 ENCOUNTER — Encounter (INDEPENDENT_AMBULATORY_CARE_PROVIDER_SITE_OTHER): Payer: Medicare Other | Admitting: Ophthalmology

## 2012-06-06 ENCOUNTER — Encounter: Payer: Self-pay | Admitting: Internal Medicine

## 2012-06-06 ENCOUNTER — Ambulatory Visit (INDEPENDENT_AMBULATORY_CARE_PROVIDER_SITE_OTHER): Payer: Medicare Other | Admitting: Internal Medicine

## 2012-06-06 VITALS — BP 180/60 | HR 64 | Resp 16 | Ht <= 58 in | Wt 139.0 lb

## 2012-06-06 DIAGNOSIS — Z79899 Other long term (current) drug therapy: Secondary | ICD-10-CM

## 2012-06-06 MED ORDER — HYDRALAZINE HCL 25 MG PO TABS: 25.0000 mg | ORAL_TABLET | Freq: Two times a day (BID) | ORAL | Status: DC

## 2012-06-06 NOTE — Patient Instructions (Signed)
Your physician recommends that you schedule a follow-up appointment in:  5 MONTHS WITH DR ROSS Your physician has recommended you make the following change in your medication: START HYDRALAZINE 25 MG  1 TAB  TWICE A DAY  Your physician recommends that you return for lab work in: FASTING LIPID AND BMET   THIS WEEK

## 2012-06-06 NOTE — Progress Notes (Signed)
HPI Patient is a 78 yar old with a history of HTN, CV disease, HL.  She was last seen by Leda Gauze in July She was placed on Maxzide for her bp  Stopped because of low NA CLonidine .1 tid was added but she felt washed out  HR slow Per her report BP has been 150 t o 160  She does not check often though She denies dizziness.  No CP    Allergies  Allergen Reactions  . Aspirin   . Penicillins     Current Outpatient Prescriptions  Medication Sig Dispense Refill  . amLODipine (NORVASC) 10 MG tablet Take 10 mg by mouth daily.        Marland Kitchen aspirin 81 MG tablet Take 81 mg by mouth daily.        Marland Kitchen atorvastatin (LIPITOR) 10 MG tablet Take 10 mg by mouth daily.        . Calcium Citrate (CALCITRATE PO) Take by mouth. 2 tabs daily       . conjugated estrogens (PREMARIN) vaginal cream Place vaginally daily. Twice a week       . ibuprofen (ADVIL,MOTRIN) 200 MG tablet Take 200 mg by mouth every 6 (six) hours as needed.        Marland Kitchen lisinopril (PRINIVIL,ZESTRIL) 20 MG tablet Take 40 mg by mouth daily. 2 tabs daily      . Multiple Vitamins-Minerals (PRESERVISION AREDS) CAPS Take by mouth daily.      Marland Kitchen VITAMIN D, CHOLECALCIFEROL, PO Take by mouth. 2 TABS DAILY (2000 UNITS DAILY)         Past Medical History  Diagnosis Date  . Hypertension   . Hyperlipidemia   . Carotid artery occlusion   . Macular degeneration     In both eyes  . Arthritis   . Hiatal hernia     Past Surgical History  Procedure Date  . Cataract extraction   . Lumbar disc surgery   . Incontinence surgery   . Abdominal hysterectomy 1971  . Neck muscle surgery   . Varicose vein surgery     left leg    No family history on file.  History   Social History  . Marital Status: Widowed    Spouse Name: N/A    Number of Children: N/A  . Years of Education: N/A   Occupational History  . Not on file.   Social History Main Topics  . Smoking status: Former Smoker    Quit date: 08/24/2009  . Smokeless tobacco: Never Used  . Alcohol  Use: No  . Drug Use: No  . Sexually Active: Not on file   Other Topics Concern  . Not on file   Social History Narrative  . No narrative on file    Review of Systems:  All systems reviewed.  They are negative to the above problem except as previously stated.  Vital Signs: BP 180/60  Pulse 64  Resp 16  Ht 4\' 9"  (1.448 m)  Wt 139 lb (63.05 kg)  BMI 30.08 kg/m2  Physical Exam Patient is in NAD HEENT:  Normocephalic, atraumatic. EOMI, PERRLA.  Neck: JVP is normal.    Lungs: clear to auscultation. No rales no wheezes.  Heart: Regular rate and rhythm. Normal S1, S2. No S3.   No significant murmurs. PMI not displaced.  Abdomen:  Supple, nontender. Normal bowel sounds. No masses. No hepatomegaly.  Extremities:   Good distal pulses throughout. No lower extremity edema.  Musculoskeletal :moving all extremities.  Neuro:  alert and oriented x3.  CN II-XII grossly intact.   Assessment and Plan:  `1.  HTN  Still high  I have asked her to add Hydralazine 25 bid to regimen.  Will see primary MD this week.  Take cuff with her to doctor's office to calibrate Follow bp at home  Send readings in 4 wks.  2.  HL  Get lipids later this week  3.  CV disease  F/U with Dr Myra Gianotti.

## 2012-06-24 ENCOUNTER — Encounter (INDEPENDENT_AMBULATORY_CARE_PROVIDER_SITE_OTHER): Payer: Medicare Other | Admitting: Ophthalmology

## 2012-06-24 DIAGNOSIS — H353290 Exudative age-related macular degeneration, unspecified eye, stage unspecified: Secondary | ICD-10-CM

## 2012-06-24 DIAGNOSIS — H35039 Hypertensive retinopathy, unspecified eye: Secondary | ICD-10-CM

## 2012-06-24 DIAGNOSIS — I10 Essential (primary) hypertension: Secondary | ICD-10-CM

## 2012-06-24 DIAGNOSIS — H43819 Vitreous degeneration, unspecified eye: Secondary | ICD-10-CM

## 2012-06-24 DIAGNOSIS — H3530 Unspecified macular degeneration: Secondary | ICD-10-CM

## 2012-08-22 ENCOUNTER — Other Ambulatory Visit: Payer: Medicare Other

## 2012-08-22 ENCOUNTER — Ambulatory Visit: Payer: Medicare Other | Admitting: Surgery

## 2012-08-25 ENCOUNTER — Encounter (INDEPENDENT_AMBULATORY_CARE_PROVIDER_SITE_OTHER): Payer: Medicare Other | Admitting: Ophthalmology

## 2012-08-25 DIAGNOSIS — H35039 Hypertensive retinopathy, unspecified eye: Secondary | ICD-10-CM

## 2012-08-25 DIAGNOSIS — H43819 Vitreous degeneration, unspecified eye: Secondary | ICD-10-CM

## 2012-08-25 DIAGNOSIS — H3530 Unspecified macular degeneration: Secondary | ICD-10-CM

## 2012-08-25 DIAGNOSIS — I10 Essential (primary) hypertension: Secondary | ICD-10-CM

## 2012-08-25 DIAGNOSIS — H353290 Exudative age-related macular degeneration, unspecified eye, stage unspecified: Secondary | ICD-10-CM

## 2012-09-09 ENCOUNTER — Encounter: Payer: Self-pay | Admitting: Neurosurgery

## 2012-09-12 ENCOUNTER — Ambulatory Visit (INDEPENDENT_AMBULATORY_CARE_PROVIDER_SITE_OTHER): Payer: Medicare Other | Admitting: Neurosurgery

## 2012-09-12 ENCOUNTER — Other Ambulatory Visit (INDEPENDENT_AMBULATORY_CARE_PROVIDER_SITE_OTHER): Payer: Medicare Other | Admitting: *Deleted

## 2012-09-12 ENCOUNTER — Encounter: Payer: Self-pay | Admitting: Neurosurgery

## 2012-09-12 VITALS — BP 155/40 | HR 61 | Resp 16 | Ht 67.0 in | Wt 128.0 lb

## 2012-09-12 DIAGNOSIS — R42 Dizziness and giddiness: Secondary | ICD-10-CM | POA: Insufficient documentation

## 2012-09-12 DIAGNOSIS — Z01810 Encounter for preprocedural cardiovascular examination: Secondary | ICD-10-CM

## 2012-09-12 DIAGNOSIS — I6529 Occlusion and stenosis of unspecified carotid artery: Secondary | ICD-10-CM

## 2012-09-12 NOTE — Progress Notes (Signed)
[facility] Carotid Office Note  CC: Carotid surveillance Referring Physician: Brabham  History of Present Illness: 79 year old female patient of Dr. Myra Burns followed for known carotid stenosis. The patient denies any signs or symptoms of CVA, TIA, amaurosis fugax or any neural deficit. The patient denies any new medical diagnoses recent surgery.  Past Medical History  Diagnosis Date  . Hypertension   . Hyperlipidemia   . Carotid artery occlusion   . Macular degeneration     In both eyes  . Arthritis   . Hiatal hernia     ROS: [x]  Positive   [ ]  Denies    General: [ ]  Weight loss, [ ]  Fever, [ ]  chills Neurologic: [ ]  Dizziness, [ ]  Blackouts, [ ]  Seizure [ ]  Stroke, [ ]  "Mini stroke", [ ]  Slurred speech, [ ]  Temporary blindness; [ ]  weakness in arms or legs, [ ]  Hoarseness Cardiac: [ ]  Chest pain/pressure, [ ]  Shortness of breath at rest [ ]  Shortness of breath with exertion, [ ]  Atrial fibrillation or irregular heartbeat Vascular: [ ]  Pain in legs with walking, [ ]  Pain in legs at rest, [ ]  Pain in legs at night,  [ ]  Non-healing ulcer, [ ]  Blood clot in vein/DVT,   Pulmonary: [ ]  Home oxygen, [ ]  Productive cough, [ ]  Coughing up blood, [ ]  Asthma,  [ ]  Wheezing Musculoskeletal:  [ ]  Arthritis, [ ]  Low back pain, [ ]  Joint pain Hematologic: [ ]  Easy Bruising, [ ]  Anemia; [ ]  Hepatitis Gastrointestinal: [ ]  Blood in stool, [ ]  Gastroesophageal Reflux/heartburn, [ ]  Trouble swallowing Urinary: [ ]  chronic Kidney disease, [ ]  on HD - [ ]  MWF or [ ]  TTHS, [ ]  Burning with urination, [ ]  Difficulty urinating Skin: [ ]  Rashes, [ ]  Wounds Psychological: [ ]  Anxiety, [ ]  Depression   Social History History  Substance Use Topics  . Smoking status: Former Smoker    Quit date: 08/24/2009  . Smokeless tobacco: Never Used  . Alcohol Use: No    Family History Family History  Problem Relation Age of Onset  . Cancer Mother     [name]    Allergies   Allergen Reactions  . Penicillins Rash    Oral swelling  . Aspirin Nausea Only    Current Outpatient Prescriptions  Medication Sig Dispense Refill  . amLODipine (NORVASC) 10 MG tablet Take 10 mg by mouth daily.        Marland Kitchen aspirin 81 MG tablet Take 81 mg by mouth daily.        Marland Kitchen atorvastatin (LIPITOR) 10 MG tablet Take 10 mg by mouth daily.        . Calcium Citrate (CALCITRATE PO) Take by mouth. 2 tabs daily       . conjugated estrogens (PREMARIN) vaginal cream Place vaginally daily. Twice a week       . hydrALAZINE (APRESOLINE) 25 MG tablet Take 1 tablet (25 mg total) by mouth 2 (two) times daily.  60 tablet  6  . ibuprofen (ADVIL,MOTRIN) 200 MG tablet Take 200 mg by mouth every 6 (six) hours as needed.        Marland Kitchen lisinopril (PRINIVIL,ZESTRIL) 20 MG tablet Take 40 mg by mouth daily. 2 tabs daily      . Multiple Vitamins-Minerals (PRESERVISION AREDS) CAPS Take by mouth daily.      Marland Kitchen VITAMIN D, CHOLECALCIFEROL, PO Take by mouth. 2 TABS DAILY (2000 UNITS DAILY)  Physical Examination  There were no vitals filed for this visit.  There is no height or weight on file to calculate BMI.  General:  WDWN in NAD Gait: Normal HEENT: WNL Eyes: Pupils equal Pulmonary: normal non-labored breathing , without Rales, rhonchi,  wheezing Cardiac: RRR, without  Murmurs, rubs or gallops; Abdomen: soft, NT, no masses Skin: no rashes, ulcers noted  Vascular Exam Pulses: 2+ radial pulses bilaterally Carotid bruits: Carotid pulses heard on the left I do not hear flow on the right Extremities without ischemic changes, no Gangrene , no cellulitis; no open wounds;  Musculoskeletal: no muscle wasting or atrophy   Neurologic: A&O X 3; Appropriate Affect ; SENSATION: normal; MOTOR FUNCTION:  moving all extremities equally. Speech is fluent/normal  Non-Invasive Vascular Imaging CAROTID DUPLEX 09/12/2012  Right ICA 80 - 99 % stenosis Left ICA 40 - 59 % stenosis   ASSESSMENT/PLAN: Asymptomatic  patient with severe right ICA stenosis which is increased since previous exam patient also has an innominate vessel velocities of greater than 380. This was discussed with Dr. Myra Burns who has ordered a CT angiogram of the neck to include the aortic arch, the patient and her daughter are aware of this and understand that they will followup with Dr. Myra Burns and sent as this was completed in the next one to 2 weeks. They are in agreement with this plan.  [name] ANP   Clinic MD: [name]

## 2012-09-16 ENCOUNTER — Encounter: Payer: Self-pay | Admitting: Surgery

## 2012-09-16 ENCOUNTER — Ambulatory Visit
Admission: RE | Admit: 2012-09-16 | Discharge: 2012-09-16 | Disposition: A | Discharge status: Home / Self Care | Payer: Medicare Other | Source: Ambulatory Visit | Attending: Surgery | Admitting: Surgery

## 2012-09-16 DIAGNOSIS — R42 Dizziness and giddiness: Secondary | ICD-10-CM

## 2012-09-16 DIAGNOSIS — Z01810 Encounter for preprocedural cardiovascular examination: Secondary | ICD-10-CM

## 2012-09-16 DIAGNOSIS — I6529 Occlusion and stenosis of unspecified carotid artery: Secondary | ICD-10-CM

## 2012-09-16 MED ORDER — IOHEXOL 350 MG/ML SOLN
100.0000 mL | Freq: Once | INTRAVENOUS | Status: AC | PRN
  Administered 2012-09-16: 100 mL via INTRAVENOUS

## 2012-09-19 ENCOUNTER — Encounter: Payer: Self-pay | Admitting: Surgery

## 2012-09-19 ENCOUNTER — Ambulatory Visit (INDEPENDENT_AMBULATORY_CARE_PROVIDER_SITE_OTHER): Payer: Medicare Other | Admitting: Surgery

## 2012-09-19 ENCOUNTER — Other Ambulatory Visit: Payer: Self-pay | Admitting: *Deleted

## 2012-09-19 VITALS — BP 151/51 | HR 64 | Ht 67.0 in | Wt 129.6 lb

## 2012-09-19 DIAGNOSIS — I6529 Occlusion and stenosis of unspecified carotid artery: Secondary | ICD-10-CM

## 2012-09-19 NOTE — Progress Notes (Signed)
[facility] [place]   Patient name: [name] MRN: [id number] DOB: [birth year] Sex: female     Chief Complaint  Patient presents with  . Re-evaluation    f/u CTA neck - carotid    HISTORY OF PRESENT ILLNESS: The patient is back today for followup. 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 Her most recent ultrasound suggested progression of her right carotid disease. For that reason I sent her for a CT scan to better evaluate this as well as to better evaluate her innominate artery stenosis. She remains asymptomatic. Other than her macular degeneration, she suffers no visual problems. She denies numbness or weakness in either extremity. She denies slurred speech.  Past Medical History  Diagnosis Date  . Hypertension   . Hyperlipidemia   . Carotid artery occlusion   . Macular degeneration     In both eyes  . Arthritis   . Hiatal hernia   . Chronic kidney disease   . DVT (deep venous thrombosis)     Past Surgical History  Procedure Date  . Cataract extraction   . Lumbar disc surgery   . Incontinence surgery   . Abdominal hysterectomy 1971  . Neck muscle surgery   . Varicose vein surgery     left leg  . Eye surgery     History   Social History  . Marital Status: Widowed    Spouse Name: N/A    Number of Children: N/A  . Years of Education: N/A   Occupational History  . Not on file.   Social History Main Topics  . Smoking status: Former Smoker    Quit date: 08/24/2009  . Smokeless tobacco: Never Used  . Alcohol Use: No  . Drug Use: No  . Sexually Active: Not on file   Other Topics Concern  . Not on file   Social History Narrative  . No narrative on file    Family History  Problem Relation Age of Onset  . Cancer Mother     [name]  . Diabetes Sister   . Diabetes Son     Allergies as of 09/19/2012 - Review Complete 09/19/2012  Allergen Reaction Noted  . Penicillins Rash 08/02/2009    . Aspirin Nausea Only 04/10/2011    Current Outpatient Prescriptions on File Prior to Visit  Medication Sig Dispense Refill  . amLODipine (NORVASC) 10 MG tablet Take 10 mg by mouth daily.        Marland Kitchen aspirin 81 MG tablet Take 81 mg by mouth daily.        Marland Kitchen atorvastatin (LIPITOR) 10 MG tablet Take 10 mg by mouth daily.        . Calcium Citrate (CALCITRATE PO) Take by mouth. 2 tabs daily       . conjugated estrogens (PREMARIN) vaginal cream Place vaginally daily. Twice a week       . hydrALAZINE (APRESOLINE) 25 MG tablet Take 1 tablet (25 mg total) by mouth 2 (two) times daily.  60 tablet  6  . ibuprofen (ADVIL,MOTRIN) 200 MG tablet Take 200 mg by mouth every 6 (six) hours as needed.        Marland Kitchen lisinopril (PRINIVIL,ZESTRIL) 20 MG tablet Take 40 mg by mouth daily. 2 tabs daily      . Multiple Vitamins-Minerals (PRESERVISION AREDS) CAPS Take by mouth daily.      Marland Kitchen VITAMIN D, CHOLECALCIFEROL, PO Take by mouth. 2 TABS DAILY (2000 UNITS DAILY)  REVIEW OF SYSTEMS: Prof.: Positive for history of DVT and varicose veins Neuro: Positive for weakness and occasional dizziness All other systems are negative  PHYSICAL EXAMINATION:   Vital signs are BP 151/51  Pulse 64  Ht 5\' 7"  (1.702 m)  Wt 129 lb 9.6 oz (58.786 kg)  BMI 20.30 kg/m2  SpO2 100% General: The patient appears their stated age. HEENT:  No gross abnormalities Pulmonary:  Non labored breathing Musculoskeletal: There are no major deformities. Neurologic: No focal weakness or paresthesias are detected, Skin: There are no ulcer or rashes noted. Psychiatric: The patient has normal affect. Cardiovascular: There is a regular rate and rhythm without significant murmur appreciated. 2+ left radial pulse, 1+ right radial pulse.   Diagnostic Studies Her CT scan today is essentially unchanged from the study obtained at November 2012. This shows 75% right carotid stenosis as well as 50% innominate artery stenosis  Assessment: Right  carotid and innominate artery stenosis Plan: I had a lengthy conversation with the patient and her daughter. She is on the borderline as to whether or not I recommend repair. I do think repair of her carotid stenosis would be relatively straightforward with carotid endarterectomy. The question would be how to handle her innominate artery stenosis. Her options would be continued observation versus stenting which could potentially be done at the time of her endarterectomy, done as a retrograde stent. Because treatment of her dual lesions is somewhat complicated, and because she continues to be asymptomatic we have come to the agreement to continue with nonoperative treatment. In addition, her CT scan shows no significant change over the past year or so. As of now she will followup with me in 6 months with a repeat ultrasound. She was counseled again about the signs and symptoms of stroke and what to do should these occur.  Jorge Ny, M.D. [facility] [place] Office: [phone] Pager:  [phone]

## 2012-10-04 ENCOUNTER — Telehealth: Payer: Self-pay | Admitting: Surgery

## 2012-10-04 ENCOUNTER — Telehealth: Payer: Self-pay

## 2012-10-04 DIAGNOSIS — R911 Solitary pulmonary nodule: Secondary | ICD-10-CM

## 2012-10-04 NOTE — Telephone Encounter (Signed)
Message copied by Phillips Odor on Tue Oct 04, 2012  1:14 PM ------      Message from: Nada Libman      Created: Mon Oct 03, 2012 10:03 PM       Her final CT neck report shows a pleural based nodule that had not been seen before.  She needs a non-contrast CT of the CHest as recommended by radiology.  I did not know this per my note at the time of her visit.  We need to let her know and schedule the CT scan.  I am happy to call her and let her know.  Let me know how you want me to proceed.  If either of youwan tot call her I am ok with that as well.  We need to document our action in EPIC      ----- Message -----         From: Rad Results In Interface         Sent: 09/16/2012   2:42 PM           To: Nada Libman, MD                   ------

## 2012-10-04 NOTE — Telephone Encounter (Signed)
Attempted to contact daughter, [name], regarding abnormal finding of pleural based nodule on CT of neck, and recommendation for a CT chest. Left voice message for her to call back to discuss need for further testing.  Also able to contact pt.  She requested her daughter be given this information, stating " I won't remember it".

## 2012-10-04 NOTE — Telephone Encounter (Signed)
Contacted daughter, [name], to make her aware of abnormal finding in left lung, and that Dr. Myra Gianotti has ordered a noncontrast CT chest to further evaluate.  Advised daughter that this will be scheduled and she will be contacted by someone in out office.  Verb. Understanding.

## 2012-10-11 ENCOUNTER — Ambulatory Visit
Admission: RE | Admit: 2012-10-11 | Discharge: 2012-10-11 | Disposition: A | Discharge status: Home / Self Care | Payer: Medicare Other | Source: Ambulatory Visit | Attending: Surgery | Admitting: Surgery

## 2012-10-11 DIAGNOSIS — R911 Solitary pulmonary nodule: Secondary | ICD-10-CM

## 2012-10-24 ENCOUNTER — Ambulatory Visit (INDEPENDENT_AMBULATORY_CARE_PROVIDER_SITE_OTHER): Payer: Medicare Other | Admitting: Internal Medicine

## 2012-10-24 ENCOUNTER — Encounter: Payer: Self-pay | Admitting: Internal Medicine

## 2012-10-24 VITALS — BP 120/60 | HR 62 | Wt 131.0 lb

## 2012-10-24 DIAGNOSIS — I10 Essential (primary) hypertension: Secondary | ICD-10-CM

## 2012-10-24 MED ORDER — HYDRALAZINE HCL 25 MG PO TABS: 37.5000 mg | ORAL_TABLET | Freq: Two times a day (BID) | ORAL | Status: DC

## 2012-10-24 NOTE — Progress Notes (Signed)
HPI Patient is a 38 yar old with a history of HTN, CV disease, HL. She was last seen by Leda Gauze in July  She was placed on Maxzide for her bp Stopped because of low NA  CLonidine .1 tid was added but she felt washed out HR slow  I saw the patient in October She was seen by Arelia Longest in [place] in Jan  SUggestion of some progression in CV disease on USN.   CT did not however.  WIll f/u with him as scheduled  Allergies  Allergen Reactions  . Penicillins Rash    Oral swelling  . Aspirin Nausea Only    Current Outpatient Prescriptions  Medication Sig Dispense Refill  . amLODipine (NORVASC) 10 MG tablet Take 10 mg by mouth daily.        Marland Kitchen aspirin 81 MG tablet Take 81 mg by mouth daily.        Marland Kitchen atorvastatin (LIPITOR) 10 MG tablet Take 10 mg by mouth daily.        . Calcium Citrate (CALCITRATE PO) Take by mouth. 2 tabs daily       . hydrALAZINE (APRESOLINE) 25 MG tablet Take 1 tablet (25 mg total) by mouth 2 (two) times daily.  60 tablet  6  . ibuprofen (ADVIL,MOTRIN) 200 MG tablet Take 200 mg by mouth every 6 (six) hours as needed.        Marland Kitchen lisinopril (PRINIVIL,ZESTRIL) 40 MG tablet Take 40 mg by mouth daily.      . Multiple Vitamins-Minerals (PRESERVISION AREDS) CAPS Take by mouth daily.      Marland Kitchen VITAMIN D, CHOLECALCIFEROL, PO Take by mouth. 2 TABS DAILY (2000 UNITS DAILY)        No current facility-administered medications for this visit.    Past Medical History  Diagnosis Date  . Hypertension   . Hyperlipidemia   . Carotid artery occlusion   . Macular degeneration     In both eyes  . Arthritis   . Hiatal hernia   . Chronic kidney disease   . DVT (deep venous thrombosis)     Past Surgical History  Procedure Laterality Date  . Cataract extraction    . Lumbar disc surgery    . Incontinence surgery    . Abdominal hysterectomy  1971  . Neck muscle surgery    . Varicose vein surgery      left leg  . Eye surgery      Family History  Problem Relation Age of Onset  . Cancer  Mother     [name]  . Diabetes Sister   . Diabetes Son     History   Social History  . Marital Status: Widowed    Spouse Name: N/A    Number of Children: N/A  . Years of Education: N/A   Occupational History  . Not on file.   Social History Main Topics  . Smoking status: Former Smoker    Quit date: 08/24/2009  . Smokeless tobacco: Never Used  . Alcohol Use: No  . Drug Use: No  . Sexually Active: Not on file   Other Topics Concern  . Not on file   Social History Narrative  . No narrative on file    Review of Systems:  All systems reviewed.  They are negative to the above problem except as previously stated.  Vital Signs: Wt 131 lb (59.421 kg)  BMI 20.51 kg/m2 BP 160/68 R arm  120/60 L arm  Pulse 62 Physical Exam  Patient in NAD HEENT:  Normocephalic, atraumatic. EOMI, PERRLA.  Neck: JVP is normal.   Lungs: clear to auscultation. No rales no wheezes.  Heart: Regular rate and rhythm. Normal S1, S2. No S3.  Gr III/VI systolic murmur at basePMI not displaced.  Abdomen:  Supple, nontender. Normal bowel sounds. No masses. No hepatomegaly.  Extremities:   Good distal pulses throughout. No lower extremity edema.  Musculoskeletal :moving all extremities.  Neuro:   alert and oriented x3.  CN II-XII grossly intact.  EKG  SR  ST changes consistent with ischemia.  62 bpm. Assessment and Plan:  1.  HTN  BP at home around 160.  WIll increase Hydralazine to 37.5 bid.  Continue other meds  Follow BP at home  If feels bad decreae to what she was on  2.  CV disease  F/U with Dr. Myra Gianotti  3.  HL  WIll get lipds from [facility]

## 2012-10-24 NOTE — Patient Instructions (Signed)
Increase Hydralazine to 37.5mg  (1.5 tabs) twice daily.  Follow up in the Fall 2014.

## 2012-11-01 ENCOUNTER — Encounter (INDEPENDENT_AMBULATORY_CARE_PROVIDER_SITE_OTHER): Payer: Medicare Other | Admitting: Ophthalmology

## 2012-11-01 DIAGNOSIS — I10 Essential (primary) hypertension: Secondary | ICD-10-CM

## 2012-11-01 DIAGNOSIS — H35039 Hypertensive retinopathy, unspecified eye: Secondary | ICD-10-CM

## 2012-11-01 DIAGNOSIS — H3530 Unspecified macular degeneration: Secondary | ICD-10-CM

## 2012-11-01 DIAGNOSIS — H43819 Vitreous degeneration, unspecified eye: Secondary | ICD-10-CM

## 2012-11-01 DIAGNOSIS — H353290 Exudative age-related macular degeneration, unspecified eye, stage unspecified: Secondary | ICD-10-CM

## 2012-11-07 ENCOUNTER — Other Ambulatory Visit: Payer: Self-pay | Admitting: *Deleted

## 2012-11-07 DIAGNOSIS — E039 Hypothyroidism, unspecified: Secondary | ICD-10-CM

## 2012-11-29 ENCOUNTER — Encounter: Payer: Self-pay | Admitting: Family Medicine

## 2012-12-28 ENCOUNTER — Ambulatory Visit: Payer: Self-pay

## 2012-12-28 ENCOUNTER — Other Ambulatory Visit: Payer: Self-pay

## 2013-01-24 ENCOUNTER — Encounter (INDEPENDENT_AMBULATORY_CARE_PROVIDER_SITE_OTHER): Payer: Medicare Other | Admitting: Ophthalmology

## 2013-01-24 DIAGNOSIS — H353290 Exudative age-related macular degeneration, unspecified eye, stage unspecified: Secondary | ICD-10-CM

## 2013-01-24 DIAGNOSIS — I10 Essential (primary) hypertension: Secondary | ICD-10-CM

## 2013-01-24 DIAGNOSIS — H43819 Vitreous degeneration, unspecified eye: Secondary | ICD-10-CM

## 2013-01-24 DIAGNOSIS — H3530 Unspecified macular degeneration: Secondary | ICD-10-CM

## 2013-01-24 DIAGNOSIS — H35039 Hypertensive retinopathy, unspecified eye: Secondary | ICD-10-CM

## 2013-02-13 ENCOUNTER — Telehealth: Payer: Self-pay | Admitting: Nurse Practitioner

## 2013-02-13 NOTE — Telephone Encounter (Signed)
appt made

## 2013-02-17 ENCOUNTER — Encounter: Payer: Self-pay | Admitting: Nurse Practitioner

## 2013-02-17 ENCOUNTER — Ambulatory Visit (INDEPENDENT_AMBULATORY_CARE_PROVIDER_SITE_OTHER): Payer: Medicare Other | Admitting: Nurse Practitioner

## 2013-02-17 VITALS — BP 126/61 | HR 56 | Temp 97.1°F | Ht 67.0 in | Wt 126.0 lb

## 2013-02-17 DIAGNOSIS — E785 Hyperlipidemia, unspecified: Secondary | ICD-10-CM

## 2013-02-17 DIAGNOSIS — I10 Essential (primary) hypertension: Secondary | ICD-10-CM

## 2013-02-17 LAB — COMPLETE METABOLIC PANEL WITH GFR
Albumin: 3.9 g/dL (ref 3.5–5.2)
BUN: 13 mg/dL (ref 6–23)
CO2: 24 mEq/L (ref 19–32)
Calcium: 9.2 mg/dL (ref 8.4–10.5)
GFR, Est African American: 67 mL/min
GFR, Est Non African American: 58 mL/min — ABNORMAL LOW
Glucose, Bld: 111 mg/dL — ABNORMAL HIGH (ref 70–99)
Potassium: 5.1 mEq/L (ref 3.5–5.3)
Sodium: 133 mEq/L — ABNORMAL LOW (ref 135–145)
Total Protein: 6.4 g/dL (ref 6.0–8.3)

## 2013-02-17 MED ORDER — LISINOPRIL 40 MG PO TABS: 40.0000 mg | ORAL_TABLET | Freq: Every day | ORAL | Status: DC

## 2013-02-17 MED ORDER — HYDRALAZINE HCL 25 MG PO TABS: 37.5000 mg | ORAL_TABLET | Freq: Two times a day (BID) | ORAL | Status: DC

## 2013-02-17 MED ORDER — AMLODIPINE BESYLATE 10 MG PO TABS: 10.0000 mg | ORAL_TABLET | Freq: Every day | ORAL | Status: DC

## 2013-02-17 MED ORDER — ATORVASTATIN CALCIUM 10 MG PO TABS: 10.0000 mg | ORAL_TABLET | Freq: Every day | ORAL | Status: DC

## 2013-02-17 NOTE — Patient Instructions (Addendum)

## 2013-02-17 NOTE — Progress Notes (Signed)
  Subjective:    Patient ID: [name], female    DOB: 1930/12/15, 79 y.o.   MRN: [id number]  Hypertension This is a chronic problem. The current episode started more than 1 year ago. The problem has been resolved since onset. The problem is controlled. Pertinent negatives include no anxiety or shortness of breath. Risk factors for coronary artery disease include dyslipidemia and post-menopausal state. Past treatments include calcium channel blockers and ACE inhibitors. The current treatment provides mild improvement. There is no history of a thyroid problem.  Hyperlipidemia This is a chronic problem. The current episode started more than 1 year ago. The problem is controlled. Recent lipid tests were reviewed and are normal. She has no history of diabetes. Pertinent negatives include no shortness of breath. Current antihyperlipidemic treatment includes statins. The current treatment provides moderate improvement of lipids. Risk factors for coronary artery disease include dyslipidemia and post-menopausal.      Review of Systems  Respiratory: Negative for shortness of breath.   Musculoskeletal: Positive for back pain.  All other systems reviewed and are negative.       Objective:   Physical Exam  Constitutional: She is oriented to person, place, and time. She appears well-developed and well-nourished.  HENT:  Head: Normocephalic.  Eyes: Pupils are equal, round, and reactive to light.  Neck: Normal range of motion. Neck supple. No thyromegaly present.  Cardiovascular: Normal rate.   Murmur heard. Carodid Bruits noted bil   Pulmonary/Chest: Effort normal.  Diminished breath sounds   Abdominal: Soft. Bowel sounds are normal. There is no tenderness.  Musculoskeletal: Normal range of motion. She exhibits no edema.  Neurological: She is alert and oriented to person, place, and time.  Skin: Skin is warm and dry.  Psychiatric: She has a normal mood and affect. Her behavior is normal.  Judgment and thought content normal.      BP 126/61  Pulse 56  Temp(Src) 97.1 F (36.2 C) (Oral)  Ht 5\' 7"  (1.702 m)  Wt 126 lb (57.153 kg)  BMI 19.73 kg/m2     Assessment & Plan:  1. Hypertension Low NA+ diet - amLODipine (NORVASC) 10 MG tablet; Take 1 tablet (10 mg total) by mouth daily.  Dispense: 30 tablet; Refill: 5 - hydrALAZINE (APRESOLINE) 25 MG tablet; Take 1.5 tablets (37.5 mg total) by mouth 2 (two) times daily.  Dispense: 135 tablet; Refill: 3 - lisinopril (PRINIVIL,ZESTRIL) 40 MG tablet; Take 1 tablet (40 mg total) by mouth daily.  Dispense: 30 tablet; Refill: 5  2. Hyperlipidemia Low fat diet - atorvastatin (LIPITOR) 10 MG tablet; Take 1 tablet (10 mg total) by mouth daily.  Dispense: 30 tablet; Refill: 5  Orders Placed This Encounter  Procedures  . COMPLETE METABOLIC PANEL WITH GFR  . NMR Lipoprofile with Lipids    DO NOT NEED TO LOOSE ANY MORE WEIGHT- LOw fat ice cream nightly  Mary-Margaret Daphine Deutscher, FNP

## 2013-02-20 ENCOUNTER — Telehealth: Payer: Self-pay | Admitting: Nurse Practitioner

## 2013-02-20 LAB — NMR LIPOPROFILE WITH LIPIDS
HDL Size: 9.7 nm (ref >=9.2)
LDL Particle Number: 587 nmol/L (ref <1000)
LDL Size: 20.8 nm (ref >20.5)
Large HDL-P: 10 umol/L (ref >=4.8)
Large VLDL-P: <0.8 nmol/L (ref <=2.7)
Small LDL Particle Number: 96 nmol/L (ref <=527)

## 2013-02-20 NOTE — Telephone Encounter (Signed)
No but cholesterol was actually low at last check so just continue 10 mg as rx and we will recheck in 3 months

## 2013-02-20 NOTE — Telephone Encounter (Signed)
Daughter aware.

## 2013-03-08 ENCOUNTER — Encounter: Payer: Self-pay | Admitting: Nurse Practitioner

## 2013-03-08 ENCOUNTER — Ambulatory Visit (INDEPENDENT_AMBULATORY_CARE_PROVIDER_SITE_OTHER): Payer: Medicare Other | Admitting: Nurse Practitioner

## 2013-03-08 VITALS — BP 180/42 | HR 60 | Ht 67.0 in | Wt 127.2 lb

## 2013-03-08 DIAGNOSIS — I10 Essential (primary) hypertension: Secondary | ICD-10-CM

## 2013-03-08 NOTE — Patient Instructions (Addendum)
Stay on your current medicines  Monitor your blood pressure in the left arm - Readings around 160 are ok.   See Dr. Tenny Craw in September as planned  Stay active and safe  Call the [facility] office at [phone] if you have any questions, problems or concerns.

## 2013-03-08 NOTE — Progress Notes (Signed)
[name] Date of Birth: 1931/07/03 Medical Record #[id number]  History of Present Illness: [name] is seen back today for a work in visit. Seen for Dr. Tenny Craw. She has a history of HTN, CKD, HLD, macular degeneration, and extensive PVD followed by Dr. Myra Gianotti. Normal EF per echo in May of 2012. This did show a PFO.   Last seen here in March - BP was up and her hydralazine was increased.   Comes back today. She is here with her daughter. Doing ok. This is an early visit back. Had a spell last week after a prolonged car drive and sitting in a lawn chair all day. The following day she had pain across her upper back and down her left arm. It hurt to touch and to move. No exertional symptoms. Remains fairly active. Not dizzy or lightheaded. Some balance issues. Some chronic back pain issues as well. Not short of breath. BP has been running about 160 in the left arm consistently.   Current Outpatient Prescriptions  Medication Sig Dispense Refill  . amLODipine (NORVASC) 10 MG tablet Take 1 tablet (10 mg total) by mouth daily.  30 tablet  5  . aspirin 81 MG tablet Take 81 mg by mouth daily.        Marland Kitchen atorvastatin (LIPITOR) 10 MG tablet Take 1 tablet (10 mg total) by mouth daily.  30 tablet  5  . Calcium Citrate (CALCITRATE PO) Take by mouth. 2 tabs daily       . hydrALAZINE (APRESOLINE) 25 MG tablet Take 1.5 tablets (37.5 mg total) by mouth 2 (two) times daily.  135 tablet  3  . ibuprofen (ADVIL,MOTRIN) 200 MG tablet Take 200 mg by mouth every 6 (six) hours as needed.        Marland Kitchen lisinopril (PRINIVIL,ZESTRIL) 40 MG tablet Take 1 tablet (40 mg total) by mouth daily.  30 tablet  5  . Multiple Vitamins-Minerals (PRESERVISION AREDS) CAPS Take by mouth daily.      Marland Kitchen VITAMIN D, CHOLECALCIFEROL, PO Take by mouth. 2 TABS DAILY (2000 UNITS DAILY)        No current facility-administered medications for this visit.    Allergies  Allergen Reactions  . Penicillins Rash    Oral swelling  . Aspirin Nausea  Only    Past Medical History  Diagnosis Date  . Hypertension     intolerant to Maxzide due to hyponatremia, intolerant to Clonidine due to slow HR/fatigue  . Hyperlipidemia   . Carotid artery occlusion   . Macular degeneration     In both eyes  . Arthritis   . Hiatal hernia   . Chronic kidney disease   . DVT (deep venous thrombosis)     Past Surgical History  Procedure Laterality Date  . Cataract extraction    . Lumbar disc surgery    . Incontinence surgery    . Abdominal hysterectomy  1971  . Neck muscle surgery    . Varicose vein surgery      left leg  . Eye surgery      History  Smoking status  . Former Smoker  . Quit date: 08/24/2009  Smokeless tobacco  . Never Used    History  Alcohol Use No    Family History  Problem Relation Age of Onset  . Cancer Mother     [name]  . Diabetes Sister   . Diabetes Son     Review of Systems: The review of systems is per the HPI.  All other systems were reviewed and are negative.  Physical Exam: BP 180/42  Pulse 60  Ht 5\' 7"  (1.702 m)  Wt 127 lb 3.2 oz (57.698 kg)  BMI 19.92 kg/m2 BP was 180/42 in the left arm and 150/54 in the right arm.  BP is 160/50 in the left arm by me.  Patient is very pleasant and in no acute distress. Skin is warm and dry. Color is normal.  HEENT is unremarkable. Normocephalic/atraumatic. PERRL. Sclera are nonicteric. Neck is supple.She has palpable pain across the upper back and with movement of looking down. No masses. No JVD. Lungs are clear. Cardiac exam shows a regular rate and rhythm. Harsh outflow murmur. Abdomen is soft. Extremities are without edema. Gait and ROM are intact. No gross neurologic deficits noted.  LABORATORY DATA:  Lab Results  Component Value Date   WBC 6.2 10/25/2009   HGB 10.0* 10/25/2009   HCT 30.8* 10/25/2009   PLT 252.0 10/25/2009   GLUCOSE 111* 02/17/2013   TRIG 65 02/17/2013   LDLCALC 62 02/17/2013   ALT 13 02/17/2013   AST 17 02/17/2013   NA 133* 02/17/2013   K  5.1 02/17/2013   CL 103 02/17/2013   CREATININE 0.94 02/17/2013   BUN 13 02/17/2013   CO2 24 02/17/2013   TSH 2.20 10/25/2009   INR 0.9 04/21/2007   CT CHEST ANGIO IMPRESSION FROM October 2012:  1. Severe atherosclerosis involving the thoracic and upper abdominal aorta without aneurysm or dissection. 2. Calcified plaque at the origin of the innominate artery causing moderate to high-grade stenosis. 3. Calcified plaque at the origin of the left common carotid artery causing mild stenosis. 4. Calcified plaque at the origin of the left subclavian artery causing mild to moderate stenosis. 5. Calcified plaque at the origin of the right vertebral artery causing mild to moderate stenosis. 6. Widely patent left vertebral artery origin. 7. Mild to moderate stenoses involving both main renal artery due to calcified plaque. 8. Calcified plaque at the origins of the celiac and SMA without significant stenosis. 9. Cardiomegaly with left adrenal hypertrophy. Extensive three- vessel coronary calcification. 10. COPD/emphysema. Calcified granuloma in the left lung apex. No acute cardiopulmonary disease. 11. Goiter. Approximate 1.9 cm nodule in the lower pole of the left lobe of the thyroid gland.  Original Report Authenticated By: Arnell Sieving, M.D.       Assessment / Plan: 1. HTN - BP is satisfactory given her degree of vascular disease. No change in medicines.   2. HLD - on statin therapy  3. PVD - followed by Dr. Myra Gianotti.   4. Back pain - seems musculoskeletal. Has palpable discomfort on exam today. She is reassured. No change in current regimen.    See her back as planned in September.   Patient is agreeable to this plan and will call if any problems develop in the interim.   Rosalio Macadamia, RN, ANP-C [facility] [address] [place], [place]  [address]

## 2013-03-17 ENCOUNTER — Encounter: Payer: Self-pay | Admitting: Surgery

## 2013-03-20 ENCOUNTER — Encounter: Payer: Self-pay | Admitting: Surgery

## 2013-03-20 ENCOUNTER — Other Ambulatory Visit (INDEPENDENT_AMBULATORY_CARE_PROVIDER_SITE_OTHER): Payer: Medicare Other | Admitting: *Deleted

## 2013-03-20 ENCOUNTER — Ambulatory Visit (INDEPENDENT_AMBULATORY_CARE_PROVIDER_SITE_OTHER): Payer: Medicare Other | Admitting: Surgery

## 2013-03-20 DIAGNOSIS — I6529 Occlusion and stenosis of unspecified carotid artery: Secondary | ICD-10-CM

## 2013-03-20 NOTE — Progress Notes (Signed)
[facility] [place]   Patient name: [name] MRN: [id number] DOB: 12-24-1930 Sex: female     Chief Complaint  Patient presents with  . Carotid    6 mo f/up with vascular lab  study today. C/O  Bilateral leg pain with walking and weakness, months. Dizziness when arms raised over head, 3-4 yrs duration.    HISTORY OF PRESENT ILLNESS: The patient is back today for followup. 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 Her  ultrasound months ago suggested progression of her right carotid disease. For that reason I sent her for a CT scan to better evaluate this as well as to better evaluate her innominate artery stenosis. She remains asymptomatic. Other than her macular degeneration, she suffers no visual problems. She denies numbness or weakness in either extremity. She denies slurred speech. Because of the complexity of her situation I elected to continue to observe this. She is back today for followup. She reports no interval changes.   Past Medical History  Diagnosis Date  . Hypertension     intolerant to Maxzide due to hyponatremia, intolerant to Clonidine due to slow HR/fatigue  . Hyperlipidemia   . Carotid artery occlusion   . Macular degeneration     In both eyes  . Arthritis   . Hiatal hernia   . Chronic kidney disease   . DVT (deep venous thrombosis)   . Anemia   . Irregular heartbeat     Past Surgical History  Procedure Laterality Date  . Cataract extraction    . Lumbar disc surgery    . Incontinence surgery    . Abdominal hysterectomy  1971  . Neck muscle surgery    . Varicose vein surgery      left leg  . Eye surgery    . Cholecystectomy  2004    Gall Bladder sling    History   Social History  . Marital Status: Widowed    Spouse Name: N/A    Number of Children: N/A  . Years of Education: N/A   Occupational History  . Not on file.   Social History Main Topics  . Smoking status: Former  Smoker    Quit date: 08/24/2009  . Smokeless tobacco: Never Used  . Alcohol Use: No  . Drug Use: No  . Sexually Active: No   Other Topics Concern  . Not on file   Social History Narrative  . No narrative on file    Family History  Problem Relation Age of Onset  . Cancer Mother     [name]  . Diabetes Sister   . Cancer Sister   . Hyperlipidemia Sister   . Heart attack Sister   . Diabetes Son   . Hyperlipidemia Daughter     Allergies as of 03/20/2013 - Review Complete 03/20/2013  Allergen Reaction Noted  . Penicillins Rash 08/02/2009  . Aspirin Nausea Only 04/10/2011    Current Outpatient Prescriptions on File Prior to Visit  Medication Sig Dispense Refill  . amLODipine (NORVASC) 10 MG tablet Take 1 tablet (10 mg total) by mouth daily.  30 tablet  5  . aspirin 81 MG tablet Take 81 mg by mouth daily.        Marland Kitchen atorvastatin (LIPITOR) 10 MG tablet Take 1 tablet (10 mg total) by mouth daily.  30 tablet  5  . Calcium Citrate (CALCITRATE PO) Take by mouth. 2 tabs daily       .  hydrALAZINE (APRESOLINE) 25 MG tablet Take 1.5 tablets (37.5 mg total) by mouth 2 (two) times daily.  135 tablet  3  . ibuprofen (ADVIL,MOTRIN) 200 MG tablet Take 200 mg by mouth every 6 (six) hours as needed.        Marland Kitchen lisinopril (PRINIVIL,ZESTRIL) 40 MG tablet Take 1 tablet (40 mg total) by mouth daily.  30 tablet  5  . Multiple Vitamins-Minerals (PRESERVISION AREDS) CAPS Take by mouth daily.      Marland Kitchen VITAMIN D, CHOLECALCIFEROL, PO Take by mouth. 2 TABS DAILY (2000 UNITS DAILY)        No current facility-administered medications on file prior to visit.     REVIEW OF SYSTEMS: Cardiovascular: Positive for shortness of breath with exertion, pain in her hips with walking, history of DVT and verrucous veins Pulmonary: No productive cough, asthma or wheezing. Neurologic: Positive for dizziness and bilateral weakness  Hematologic: No bleeding problems or clotting disorders. Musculoskeletal: No joint pain or  joint swelling. Gastrointestinal: No blood in stool or hematemesis Genitourinary: No dysuria or hematuria. Psychiatric:: No history of major depression. Integumentary: No rashes or ulcers. Constitutional: No fever or chills.  PHYSICAL EXAMINATION:   Vital signs are BP 196/67  Pulse 64  Resp 16  Ht 5\' 7"  (1.702 m)  Wt 126 lb (57.153 kg)  BMI 19.73 kg/m2  SpO2 99% General: The patient appears their stated age. HEENT:  No gross abnormalities Pulmonary:  Non labored breathing Musculoskeletal: There are no major deformities. Neurologic: No focal weakness or paresthesias are detected, Skin: There are no ulcer or rashes noted. Psychiatric: The patient has normal affect. Cardiovascular: There is a regular rate and rhythm without significant murmur appreciated. Bilateral carotid bruits. Palpable radial pulses bilaterally   Diagnostic Studies Duplex ultrasound was ordered and reviewed. This is essentially a stable exam. She remains borderline 80% right carotid stenosis. She also has elevated velocities within her right innominate artery and right subclavian artery as well as left subclavian artery.  Assessment: Extensive extracranial carotid occlusive disease Plan: The patient remained asymptomatic. I suspect this is going to be very challenging to repair, and therefore we have elected to continue to monitor this. If she becomes symptomatic, or if she has continued progression of her disease, I feel the next step will be a formal angiogram to see whether or not she would be a candidate for retrograde I innominate artery stenting. She is going to followup with me in 6 months.  Jorge Ny, M.D. [facility] [place] Office: [phone] Pager:  [phone]

## 2013-03-28 ENCOUNTER — Other Ambulatory Visit: Payer: Self-pay | Admitting: *Deleted

## 2013-03-29 ENCOUNTER — Telehealth: Payer: Self-pay | Admitting: *Deleted

## 2013-03-29 NOTE — Telephone Encounter (Signed)
recheck in 2 days

## 2013-03-29 NOTE — Telephone Encounter (Signed)
Daughter will have home health recheck urine for protein.

## 2013-03-29 NOTE — Telephone Encounter (Signed)
Pt had 3+ protein in urine today. FYI

## 2013-04-01 ENCOUNTER — Other Ambulatory Visit: Payer: Self-pay | Admitting: Nurse Practitioner

## 2013-04-19 ENCOUNTER — Encounter: Payer: Self-pay | Admitting: *Deleted

## 2013-04-25 ENCOUNTER — Encounter (INDEPENDENT_AMBULATORY_CARE_PROVIDER_SITE_OTHER): Payer: Medicare Other | Admitting: Ophthalmology

## 2013-04-25 DIAGNOSIS — H353290 Exudative age-related macular degeneration, unspecified eye, stage unspecified: Secondary | ICD-10-CM

## 2013-04-25 DIAGNOSIS — I10 Essential (primary) hypertension: Secondary | ICD-10-CM

## 2013-04-25 DIAGNOSIS — H3530 Unspecified macular degeneration: Secondary | ICD-10-CM

## 2013-04-25 DIAGNOSIS — H35039 Hypertensive retinopathy, unspecified eye: Secondary | ICD-10-CM

## 2013-04-25 DIAGNOSIS — H43819 Vitreous degeneration, unspecified eye: Secondary | ICD-10-CM

## 2013-05-01 ENCOUNTER — Other Ambulatory Visit: Payer: Self-pay | Admitting: Internal Medicine

## 2013-05-08 ENCOUNTER — Encounter: Payer: Self-pay | Admitting: Internal Medicine

## 2013-05-08 ENCOUNTER — Ambulatory Visit (INDEPENDENT_AMBULATORY_CARE_PROVIDER_SITE_OTHER): Payer: Medicare Other | Admitting: Internal Medicine

## 2013-05-08 VITALS — BP 187/59 | HR 56 | Ht 67.0 in | Wt 127.0 lb

## 2013-05-08 DIAGNOSIS — M79605 Pain in left leg: Secondary | ICD-10-CM

## 2013-05-08 DIAGNOSIS — M79609 Pain in unspecified limb: Secondary | ICD-10-CM

## 2013-05-08 DIAGNOSIS — M79604 Pain in right leg: Secondary | ICD-10-CM

## 2013-05-08 DIAGNOSIS — I10 Essential (primary) hypertension: Secondary | ICD-10-CM

## 2013-05-08 DIAGNOSIS — I679 Cerebrovascular disease, unspecified: Secondary | ICD-10-CM

## 2013-05-08 DIAGNOSIS — E785 Hyperlipidemia, unspecified: Secondary | ICD-10-CM

## 2013-05-08 DIAGNOSIS — H3530 Unspecified macular degeneration: Secondary | ICD-10-CM

## 2013-05-08 NOTE — Patient Instructions (Addendum)
Your physician has requested that you have a lower or upper extremity arterial duplex. This test is an ultrasound of the arteries in the legs or arms. It looks at arterial blood flow in the legs and arms. Allow one hour for Lower and Upper Arterial scans. There are no restrictions or special instructions  Your physician recommends that you continue on your current medications as directed. Please refer to the Current Medication list given to you today.   Your physician wants you to follow-up in: 6 months You will receive a reminder letter in the mail two months in advance. If you don't receive a letter, please call our office to schedule the follow-up appointment.

## 2013-05-08 NOTE — Progress Notes (Signed)
[name] Date of Birth: October 11, 1930 Medical Record #[id number]  History of Present Illness: Patient is a 79 yo with a history of HTN, CV disease, HL, macular degeneration.  She is also followed by Dr [name] She was last seen by L Gerhardt in clinic.  SHe denies CP  Breathing is OK  Does have LE pain that she has attributed to her back  She had this evaluated and was told it is not due to back problems.    Current Outpatient Prescriptions  Medication Sig Dispense Refill  . amLODipine (NORVASC) 10 MG tablet TAKE ONE TABLET BY MOUTH ONE TIME DAILY  30 tablet  4  . aspirin 81 MG tablet Take 81 mg by mouth daily.        Marland Kitchen atorvastatin (LIPITOR) 10 MG tablet Take 1 tablet (10 mg total) by mouth daily.  30 tablet  5  . Calcium Citrate (CALCITRATE PO) Take by mouth. 2 tabs daily       . hydrALAZINE (APRESOLINE) 25 MG tablet Take 1.5 tablets (37.5 mg total) by mouth 2 (two) times daily.  135 tablet  3  . hydrALAZINE (APRESOLINE) 25 MG tablet TAKE 1 AND 1/2 TABLETS (37.5MG ) BY MOUTH TWICE A DAY AS INSTRUCTED  270 tablet  2  . ibuprofen (ADVIL,MOTRIN) 200 MG tablet Take 200 mg by mouth every 6 (six) hours as needed.        Marland Kitchen lisinopril (PRINIVIL,ZESTRIL) 40 MG tablet Take 1 tablet (40 mg total) by mouth daily.  30 tablet  5  . Multiple Vitamins-Minerals (PRESERVISION AREDS) CAPS Take by mouth daily.      Marland Kitchen VITAMIN D, CHOLECALCIFEROL, PO Take by mouth. 2 TABS DAILY (2000 UNITS DAILY)        No current facility-administered medications for this visit.    Allergies  Allergen Reactions  . Penicillins Rash    Oral swelling  . Aspirin Nausea Only    Past Medical History  Diagnosis Date  . Hypertension     intolerant to Maxzide due to hyponatremia, intolerant to Clonidine due to slow HR/fatigue  . Hyperlipidemia   . Carotid artery occlusion   . Macular degeneration     In both eyes  . Arthritis   . Hiatal hernia   . Chronic kidney disease   . DVT (deep venous thrombosis)   . Anemia     . Irregular heartbeat     Past Surgical History  Procedure Laterality Date  . Cataract extraction    . Lumbar disc surgery    . Incontinence surgery    . Abdominal hysterectomy  1971  . Neck muscle surgery    . Varicose vein surgery      left leg  . Eye surgery    . Cholecystectomy  2004    Gall Bladder sling    History  Smoking status  . Former Smoker  . Quit date: 08/24/2009  Smokeless tobacco  . Never Used    History  Alcohol Use No    Family History  Problem Relation Age of Onset  . Cancer Mother     [name]  . Diabetes Sister   . Cancer Sister   . Hyperlipidemia Sister   . Heart attack Sister   . Diabetes Son   . Hyperlipidemia Daughter     Review of Systems: The review of systems is per the HPI.  All other systems were reviewed and are negative.  Physical Exam: BP 187/59  Pulse 56  Ht 5\' 7"  (  1.702 m)  Wt 127 lb (57.607 kg)  BMI 19.89 kg/m2 BP was 145/50 R arm.  155/45 L arm.   Patient is very pleasant and in no acute distress.  l.  HEENT is unremarkable. Normocephalic/atraumatic. PERRL. Sclera are nonicteric.  Neck is supple.She has palpable pain across the upper back and with movement of looking down. No masses. No JVD. Bruits Lungs are clear.  Cardiac exam shows a regular rate and rhythm.  II/VI systolic murmur . Abdomen is soft.  Extremities are without edema. 1+ PT Neuro. No gross neurologic deficits noted.  LABORATORY DATA:  Lab Results  Component Value Date   WBC 6.2 10/25/2009   HGB 10.0* 10/25/2009   HCT 30.8* 10/25/2009   PLT 252.0 10/25/2009   GLUCOSE 111* 02/17/2013   TRIG 65 02/17/2013   LDLCALC 62 02/17/2013   ALT 13 02/17/2013   AST 17 02/17/2013   NA 133* 02/17/2013   K 5.1 02/17/2013   CL 103 02/17/2013   CREATININE 0.94 02/17/2013   BUN 13 02/17/2013   CO2 24 02/17/2013   TSH 2.20 10/25/2009   INR 0.9 04/21/2007   CT CHEST ANGIO IMPRESSION FROM October 2012:  1. Severe atherosclerosis involving the thoracic and upper abdominal  aorta without aneurysm or dissection. 2. Calcified plaque at the origin of the innominate artery causing moderate to high-grade stenosis. 3. Calcified plaque at the origin of the left common carotid artery causing mild stenosis. 4. Calcified plaque at the origin of the left subclavian artery causing mild to moderate stenosis. 5. Calcified plaque at the origin of the right vertebral artery causing mild to moderate stenosis. 6. Widely patent left vertebral artery origin. 7. Mild to moderate stenoses involving both main renal artery due to calcified plaque. 8. Calcified plaque at the origins of the celiac and SMA without significant stenosis. 9. Cardiomegaly with left adrenal hypertrophy. Extensive three- vessel coronary calcification. 10. COPD/emphysema. Calcified granuloma in the left lung apex. No acute cardiopulmonary disease. 11. Goiter. Approximate 1.9 cm nodule in the lower pole of the left lobe of the thyroid gland.  Original Report Authenticated By: Arnell Sieving, M.D.     EKG  SB 56 bpm.  T wave inversion V2 to V6, I, AVL  .    Assessment / Plan: 1. HTN -  BP is high, labile.  She does have sever CV disease and does need increased pressure for perfusion.  I would keep on current regimen for now.   2. HLD - on statin therapy  3. PVD - followed by Dr. Myra Gianotti for CV disease. Will set up for LE dopplers.    See her back as planned in the spring

## 2013-05-12 ENCOUNTER — Encounter (INDEPENDENT_AMBULATORY_CARE_PROVIDER_SITE_OTHER): Payer: Medicare Other

## 2013-05-12 DIAGNOSIS — I739 Peripheral vascular disease, unspecified: Secondary | ICD-10-CM

## 2013-05-12 DIAGNOSIS — M79604 Pain in right leg: Secondary | ICD-10-CM

## 2013-05-12 DIAGNOSIS — I70219 Atherosclerosis of native arteries of extremities with intermittent claudication, unspecified extremity: Secondary | ICD-10-CM

## 2013-05-22 ENCOUNTER — Other Ambulatory Visit: Payer: Self-pay

## 2013-05-22 DIAGNOSIS — I739 Peripheral vascular disease, unspecified: Secondary | ICD-10-CM

## 2013-06-05 ENCOUNTER — Ambulatory Visit: Payer: Medicare Other | Admitting: Nurse Practitioner

## 2013-06-08 ENCOUNTER — Ambulatory Visit: Payer: Medicare Other | Admitting: Nurse Practitioner

## 2013-06-26 ENCOUNTER — Encounter: Payer: Medicare Other | Admitting: Surgery

## 2013-06-29 ENCOUNTER — Inpatient Hospital Stay (HOSPITAL_COMMUNITY): Payer: Medicare Other

## 2013-06-29 ENCOUNTER — Encounter (HOSPITAL_COMMUNITY): Payer: Self-pay | Admitting: Emergency Medicine

## 2013-06-29 ENCOUNTER — Inpatient Hospital Stay (HOSPITAL_COMMUNITY)
Admission: EM | Admit: 2013-06-29 | Discharge: 2013-07-01 | DRG: 068 | Disposition: A | Discharge status: Home / Self Care | Payer: Medicare Other | Attending: Internal Medicine | Admitting: Internal Medicine

## 2013-06-29 DIAGNOSIS — Z7982 Long term (current) use of aspirin: Secondary | ICD-10-CM

## 2013-06-29 DIAGNOSIS — Z87891 Personal history of nicotine dependence: Secondary | ICD-10-CM

## 2013-06-29 DIAGNOSIS — R42 Dizziness and giddiness: Secondary | ICD-10-CM

## 2013-06-29 DIAGNOSIS — I658 Occlusion and stenosis of other precerebral arteries: Secondary | ICD-10-CM | POA: Diagnosis present

## 2013-06-29 DIAGNOSIS — I6529 Occlusion and stenosis of unspecified carotid artery: Principal | ICD-10-CM | POA: Diagnosis present

## 2013-06-29 DIAGNOSIS — N189 Chronic kidney disease, unspecified: Secondary | ICD-10-CM | POA: Diagnosis present

## 2013-06-29 DIAGNOSIS — Z7902 Long term (current) use of antithrombotics/antiplatelets: Secondary | ICD-10-CM

## 2013-06-29 DIAGNOSIS — R011 Cardiac murmur, unspecified: Secondary | ICD-10-CM

## 2013-06-29 DIAGNOSIS — Z79899 Other long term (current) drug therapy: Secondary | ICD-10-CM

## 2013-06-29 DIAGNOSIS — H3530 Unspecified macular degeneration: Secondary | ICD-10-CM | POA: Diagnosis present

## 2013-06-29 DIAGNOSIS — I6521 Occlusion and stenosis of right carotid artery: Secondary | ICD-10-CM

## 2013-06-29 DIAGNOSIS — I2510 Atherosclerotic heart disease of native coronary artery without angina pectoris: Secondary | ICD-10-CM | POA: Diagnosis present

## 2013-06-29 DIAGNOSIS — I129 Hypertensive chronic kidney disease with stage 1 through stage 4 chronic kidney disease, or unspecified chronic kidney disease: Secondary | ICD-10-CM | POA: Diagnosis present

## 2013-06-29 DIAGNOSIS — I10 Essential (primary) hypertension: Secondary | ICD-10-CM

## 2013-06-29 DIAGNOSIS — E041 Nontoxic single thyroid nodule: Secondary | ICD-10-CM | POA: Diagnosis present

## 2013-06-29 DIAGNOSIS — E785 Hyperlipidemia, unspecified: Secondary | ICD-10-CM

## 2013-06-29 DIAGNOSIS — H539 Unspecified visual disturbance: Secondary | ICD-10-CM

## 2013-06-29 DIAGNOSIS — Z86718 Personal history of other venous thrombosis and embolism: Secondary | ICD-10-CM

## 2013-06-29 LAB — CBC WITH DIFFERENTIAL/PLATELET
Basophils Absolute: 0.1 10*3/uL (ref 0.0–0.1)
Basophils Relative: 2 % — ABNORMAL HIGH (ref 0–1)
Eosinophils Absolute: 0.3 10*3/uL (ref 0.0–0.7)
Eosinophils Relative: 5 % (ref 0–5)
HCT: 27.4 % — ABNORMAL LOW (ref 36.0–46.0)
Hemoglobin: 9.4 g/dL — ABNORMAL LOW (ref 12.0–15.0)
Lymphocytes Relative: 16 % (ref 12–46)
Lymphs Abs: 0.8 10*3/uL (ref 0.7–4.0)
MCH: 29.7 pg (ref 26.0–34.0)
MCHC: 34.3 g/dL (ref 30.0–36.0)
MCV: 86.7 fL (ref 78.0–100.0)
Monocytes Absolute: 0.5 10*3/uL (ref 0.1–1.0)
Monocytes Relative: 10 % (ref 3–12)
Neutro Abs: 3.5 10*3/uL (ref 1.7–7.7)
Neutrophils Relative %: 68 % (ref 43–77)
Platelets: 222 10*3/uL (ref 150–400)
RBC: 3.16 MIL/uL — ABNORMAL LOW (ref 3.87–5.11)
RDW: 14 % (ref 11.5–15.5)
WBC: 5.2 10*3/uL (ref 4.0–10.5)

## 2013-06-29 LAB — URINALYSIS, ROUTINE W REFLEX MICROSCOPIC
Bilirubin Urine: NEGATIVE
Glucose, UA: NEGATIVE mg/dL
Hgb urine dipstick: NEGATIVE
Ketones, ur: NEGATIVE mg/dL
Leukocytes, UA: NEGATIVE
Nitrite: NEGATIVE
Protein, ur: 100 mg/dL — AB
Specific Gravity, Urine: 1.008 (ref 1.005–1.030)
Urobilinogen, UA: 0.2 mg/dL (ref 0.0–1.0)
pH: 7 (ref 5.0–8.0)

## 2013-06-29 LAB — BASIC METABOLIC PANEL
BUN: 17 mg/dL (ref 6–23)
CO2: 22 mEq/L (ref 19–32)
Calcium: 9.1 mg/dL (ref 8.4–10.5)
Chloride: 103 mEq/L (ref 96–112)
Creatinine, Ser: 0.93 mg/dL (ref 0.50–1.10)
GFR calc Af Amer: 66 mL/min — ABNORMAL LOW (ref >90)
GFR calc non Af Amer: 57 mL/min — ABNORMAL LOW (ref >90)
Glucose, Bld: 108 mg/dL — ABNORMAL HIGH (ref 70–99)
Potassium: 4.1 mEq/L (ref 3.5–5.1)
Sodium: 135 mEq/L (ref 135–145)

## 2013-06-29 LAB — URINE MICROSCOPIC-ADD ON

## 2013-06-29 MED ORDER — OCUVITE-LUTEIN PO CAPS
1.0000 | ORAL_CAPSULE | Freq: Every day | ORAL | Status: DC
  Administered 2013-06-29 – 2013-07-01 (×3): 1 via ORAL
  Filled 2013-06-29 (×3): qty 1

## 2013-06-29 MED ORDER — ACETAMINOPHEN 325 MG PO TABS: 650.0000 mg | ORAL_TABLET | Freq: Four times a day (QID) | ORAL | Status: DC | PRN

## 2013-06-29 MED ORDER — SODIUM CHLORIDE 0.9 % IV SOLN: 250.0000 mL | INTRAVENOUS | Status: DC | PRN

## 2013-06-29 MED ORDER — ATORVASTATIN CALCIUM 10 MG PO TABS
10.0000 mg | ORAL_TABLET | Freq: Every day | ORAL | Status: DC
  Administered 2013-06-29 – 2013-07-01 (×3): 10 mg via ORAL
  Filled 2013-06-29 (×3): qty 1

## 2013-06-29 MED ORDER — SODIUM CHLORIDE 0.9 % IJ SOLN
3.0000 mL | Freq: Two times a day (BID) | INTRAMUSCULAR | Status: DC
  Administered 2013-06-30: 3 mL via INTRAVENOUS

## 2013-06-29 MED ORDER — SODIUM CHLORIDE 0.9 % IJ SOLN
3.0000 mL | Freq: Two times a day (BID) | INTRAMUSCULAR | Status: DC
  Administered 2013-06-30 – 2013-07-01 (×3): 3 mL via INTRAVENOUS

## 2013-06-29 MED ORDER — ACETAMINOPHEN 650 MG RE SUPP: 650.0000 mg | Freq: Four times a day (QID) | RECTAL | Status: DC | PRN

## 2013-06-29 MED ORDER — ONDANSETRON HCL 4 MG/2ML IJ SOLN: 4.0000 mg | Freq: Four times a day (QID) | INTRAMUSCULAR | Status: DC | PRN

## 2013-06-29 MED ORDER — HYDRALAZINE HCL 25 MG PO TABS
37.5000 mg | ORAL_TABLET | Freq: Two times a day (BID) | ORAL | Status: DC
  Administered 2013-06-29 – 2013-07-01 (×5): 37.5 mg via ORAL
  Filled 2013-06-29 (×7): qty 1.5

## 2013-06-29 MED ORDER — SODIUM CHLORIDE 0.9 % IV BOLUS (SEPSIS)
500.0000 mL | Freq: Once | INTRAVENOUS | Status: AC
  Administered 2013-06-29: 500 mL via INTRAVENOUS

## 2013-06-29 MED ORDER — SODIUM CHLORIDE 0.9 % IV SOLN
INTRAVENOUS | Status: DC
  Administered 2013-06-29 – 2013-06-30 (×2): via INTRAVENOUS
  Administered 2013-07-01: 1000 mL via INTRAVENOUS

## 2013-06-29 MED ORDER — SODIUM CHLORIDE 0.9 % IJ SOLN: 3.0000 mL | INTRAMUSCULAR | Status: DC | PRN

## 2013-06-29 MED ORDER — MORPHINE SULFATE 2 MG/ML IJ SOLN: 2.0000 mg | INTRAMUSCULAR | Status: DC | PRN

## 2013-06-29 MED ORDER — VITAMIN D3 25 MCG (1000 UNIT) PO TABS
1000.0000 [IU] | ORAL_TABLET | Freq: Every day | ORAL | Status: DC
  Administered 2013-06-29 – 2013-06-30 (×2): 1000 [IU] via ORAL
  Filled 2013-06-29 (×3): qty 1

## 2013-06-29 MED ORDER — HYDROCODONE-ACETAMINOPHEN 5-325 MG PO TABS
1.0000 | ORAL_TABLET | ORAL | Status: DC | PRN
  Administered 2013-06-29: 1 via ORAL
  Filled 2013-06-29: qty 1

## 2013-06-29 MED ORDER — ONDANSETRON HCL 4 MG PO TABS: 4.0000 mg | ORAL_TABLET | Freq: Four times a day (QID) | ORAL | Status: DC | PRN

## 2013-06-29 MED ORDER — AMLODIPINE BESYLATE 10 MG PO TABS
10.0000 mg | ORAL_TABLET | Freq: Every day | ORAL | Status: DC
  Administered 2013-06-29 – 2013-07-01 (×3): 10 mg via ORAL
  Filled 2013-06-29 (×3): qty 1

## 2013-06-29 MED ORDER — SENNA 8.6 MG PO TABS
1.0000 | ORAL_TABLET | Freq: Two times a day (BID) | ORAL | Status: DC
  Administered 2013-06-30 – 2013-07-01 (×3): 8.6 mg via ORAL
  Filled 2013-06-29 (×6): qty 1

## 2013-06-29 MED ORDER — ASPIRIN 81 MG PO CHEW
81.0000 mg | CHEWABLE_TABLET | Freq: Every day | ORAL | Status: DC
  Administered 2013-06-29 – 2013-07-01 (×3): 81 mg via ORAL
  Filled 2013-06-29 (×3): qty 1

## 2013-06-29 MED ORDER — DOCUSATE SODIUM 100 MG PO CAPS
100.0000 mg | ORAL_CAPSULE | Freq: Two times a day (BID) | ORAL | Status: DC
  Administered 2013-06-30 – 2013-07-01 (×3): 100 mg via ORAL
  Filled 2013-06-29 (×6): qty 1

## 2013-06-29 MED ORDER — LISINOPRIL 40 MG PO TABS
40.0000 mg | ORAL_TABLET | Freq: Every day | ORAL | Status: DC
  Administered 2013-06-29 – 2013-07-01 (×3): 40 mg via ORAL
  Filled 2013-06-29 (×3): qty 1

## 2013-06-29 MED ORDER — ASPIRIN 81 MG PO TABS: 81.0000 mg | ORAL_TABLET | Freq: Every day | ORAL | Status: DC

## 2013-06-29 MED ORDER — PRESERVISION AREDS PO CAPS: 1.0000 | ORAL_CAPSULE | Freq: Every day | ORAL | Status: DC

## 2013-06-29 NOTE — ED Notes (Signed)
Pt ambulated to the bathroom. Tolerated well. Gait steady but slow.

## 2013-06-29 NOTE — ED Notes (Signed)
Pt arrives to the ED from home with dizziness since she woke up this morning at 0345 and she also fell off the toilet this morning. Abrasion to the right elbow

## 2013-06-29 NOTE — H&P (Signed)
[facility] History and Physical  [name]:[id number] DOB: [birth year] DOA: 06/29/2013  Referring physician:  PCP: Bennie Pierini, FNP  Specialists: Dr. Myra Gianotti  Chief Complaint: Dizziness  HPI: [name] is a 79 y.o. female  History of carotid artery stenosis, hypertension, hyperlipidemia, presents emergency department for complaints of dizziness. Patient states early this morning she will cup to the bathroom she started looking to her right side she became dizzy and thought her vision was going. Patient covered her head with a blanket until his symptoms resolved. Patient then was able to go to the bathroom however while in the bathroom she had a similar episode. She then fell however does not remember hitting her head. Patient states that she does have macular degeneration and does receive injections for this. She does have a known history of carotid restenosis. She has been following up with Dr. Myra Gianotti.  Patient has not had surgical intervention for this in the past however she is currently now symptomatic. Her dizziness only occurs in her head is turned to the right. Dr. Hart Rochester was contacted emergency department, patient will be seen by Dr. Myra Gianotti or Dr. Imogene Burn today.  Review of Systems:  Constitutional: Denies fever, chills, diaphoresis, appetite change and fatigue.  HEENT: Denies photophobia, eye pain, redness, hearing loss, ear pain, congestion, sore throat, rhinorrhea, sneezing, mouth sores, trouble swallowing, neck pain, neck stiffness and tinnitus.   Respiratory: Denies SOB, DOE, cough, chest tightness, and wheezing.   Cardiovascular: Denies chest pain, palpitations and leg swelling.  Gastrointestinal: Denies nausea, vomiting, abdominal pain, diarrhea, constipation, blood in stool and abdominal distention.  Genitourinary: Denies dysuria, urgency, frequency, hematuria, flank pain and difficulty urinating.  Musculoskeletal: Denies myalgias, back pain, joint  swelling, arthralgias and gait problem.  Skin: Denies pallor, rash and wound.  Neurological: Denies seizures, syncope, weakness, light-headedness, numbness and headaches.  Hematological: Denies adenopathy. Easy bruising, personal or family bleeding history  Psychiatric/Behavioral: Denies suicidal ideation, mood changes, confusion, nervousness, sleep disturbance and agitation  Past Medical History  Diagnosis Date  . Hypertension     intolerant to Maxzide due to hyponatremia, intolerant to Clonidine due to slow HR/fatigue  . Hyperlipidemia   . Carotid artery occlusion   . Macular degeneration     In both eyes  . Arthritis   . Hiatal hernia   . Chronic kidney disease   . DVT (deep venous thrombosis)   . Anemia   . Irregular heartbeat   . Family history of anesthesia complication     daughter has nausea  . Coronary artery disease    Past Surgical History  Procedure Laterality Date  . Cataract extraction    . Lumbar disc surgery    . Incontinence surgery    . Abdominal hysterectomy  1971  . Neck muscle surgery    . Varicose vein surgery      left leg  . Eye surgery    . Cholecystectomy  2004    Gall Bladder sling   Social History:  reports that she quit smoking about 3 years ago. She has never used smokeless tobacco. She reports that she does not drink alcohol or use illicit drugs. Patient lives with her daughter during the week and returns to her home during weekends. She does have very good family support.  Allergies  Allergen Reactions  . Penicillins Rash    Oral swelling    Family History  Problem Relation Age of Onset  . Cancer Mother     [name]  .  Diabetes Sister   . Cancer Sister   . Hyperlipidemia Sister   . Heart attack Sister   . Diabetes Son   . Hyperlipidemia Daughter    Prior to Admission medications   Medication Sig Start Date End Date Taking? Authorizing Provider  amLODipine (NORVASC) 10 MG tablet Take 10 mg by mouth daily.   Yes Historical  Provider, MD  aspirin 81 MG tablet Take 81 mg by mouth daily.     Yes Historical Provider, MD  atorvastatin (LIPITOR) 10 MG tablet Take 1 tablet (10 mg total) by mouth daily. 02/17/13  Yes Mary-Margaret Daphine Deutscher, FNP  Calcium Citrate (CALCITRATE PO) Take 1 tablet by mouth daily.    Yes Historical Provider, MD  cholecalciferol (VITAMIN D) 1000 UNITS tablet Take 1,000 Units by mouth daily.   Yes Historical Provider, MD  hydrALAZINE (APRESOLINE) 25 MG tablet Take 1.5 tablets (37.5 mg total) by mouth 2 (two) times daily. 02/17/13  Yes Mary-Margaret Daphine Deutscher, FNP  ibuprofen (ADVIL,MOTRIN) 200 MG tablet Take 200 mg by mouth every 6 (six) hours as needed for mild pain.    Yes Historical Provider, MD  lisinopril (PRINIVIL,ZESTRIL) 40 MG tablet Take 1 tablet (40 mg total) by mouth daily. 02/17/13  Yes Mary-Margaret Daphine Deutscher, FNP  Multiple Vitamins-Minerals (PRESERVISION AREDS) CAPS Take 1 capsule by mouth daily.    Yes Historical Provider, MD   Physical Exam: Filed Vitals:   06/29/13 0944  BP: 150/54  Pulse: 66  Temp: 98.6 F (37 C)  Resp: 20     General: Well developed, well nourished, NAD, appears stated age  HEENT: NCAT, PERRLA, EOMI, Anicteic Sclera, mucous membranes moist. No pharyngeal erythema or exudates  Neck: Supple, no JVD, no masses, carotid bruit noted bilaterally  Cardiovascular: S1 S2 auscultated, 3/6 SEM, Regular rate and rhythm.  Respiratory: Clear to auscultation bilaterally with equal chest rise  Abdomen: Soft, nontender, nondistended, + bowel sounds  Extremities: warm dry without cyanosis clubbing or edema  Neuro: AAOx3, cranial nerves grossly intact. Strength 5/5 in patient's upper and lower extremities bilaterally  Skin: Without rashes exudates or nodules  Psych: Normal affect and demeanor with intact judgement and insight  Labs on Admission:  Basic Metabolic Panel:  Recent Labs Lab 06/29/13 0500  NA 135  K 4.1  CL 103  CO2 22  GLUCOSE 108*  BUN 17  CREATININE  0.93  CALCIUM 9.1   Liver Function Tests: No results found for this basename: AST, ALT, ALKPHOS, BILITOT, PROT, ALBUMIN,  in the last 168 hours No results found for this basename: LIPASE, AMYLASE,  in the last 168 hours No results found for this basename: AMMONIA,  in the last 168 hours CBC:  Recent Labs Lab 06/29/13 0500  WBC 5.2  NEUTROABS 3.5  HGB 9.4*  HCT 27.4*  MCV 86.7  PLT 222   Cardiac Enzymes: No results found for this basename: CKTOTAL, CKMB, CKMBINDEX, TROPONINI,  in the last 168 hours  BNP (last 3 results) No results found for this basename: PROBNP,  in the last 8760 hours CBG: No results found for this basename: GLUCAP,  in the last 168 hours  Radiological Exams on Admission: Ct Head Wo Contrast  06/29/2013   CLINICAL DATA:  Dizziness, seeing black spots  EXAM: CT HEAD WITHOUT CONTRAST  TECHNIQUE: Contiguous axial images were obtained from the base of the skull through the vertex without intravenous contrast.  COMPARISON:  None.  FINDINGS: No skull fracture is noted. No intracranial hemorrhage, mass effect or midline shift. Paranasal  sinuses and mastoid air cells are unremarkable. Bilateral parenchymal punctate calcification may represent sequela from prior infection. Mild cerebral atrophy. No acute cortical infarction. No mass lesion is noted on this unenhanced scan.  IMPRESSION: No acute intracranial abnormality. Mild cerebral atrophy.   Electronically Signed   By: Natasha Mead M.D.   On: 06/29/2013 08:33    Assessment/Plan Active Problems:   DYSLIPIDEMIA   HYPERTENSION   CAROTID STENOSIS   Murmur   Dizziness  Carotid artery stenosis Patient will be admitted to telemetry floor. Will contact vascular surgery for further management and intervention. Patient had ultrasound conducted in July 2014 showing an 80-99% right internal carotid artery stenosis with a 40-59% left internal carotid stenosis. Right external carotid artery appeared occluded. Patient also has  left subclavian artery stenosis.  Hypertension Will continue amlodipine, hydralazine, lisinopril.  Dizziness Patient was placed on telemetry floor with neurochecks. Likely secondary to her carotid artery stenosis.  Dyslipidemia Will continue patient on atorvastatin.   DVT prophylaxis: SCDs  Code Status: Full  Condition: Guarded  Family Communication: Daughter is at bedside. Admission, patients condition and plan of care including tests being ordered have been discussed with the patient and daughters who indicate understanding and agree with the plan and Code Status.  Disposition Plan: Admitted  Time spent: 45 minutes  Katena Petitjean D.O. [facility] Pager [phone]  If 7PM-7AM, please contact night-coverage [email or web address] Password [email or web address] 06/29/2013, 1:49 PM

## 2013-06-29 NOTE — Consult Note (Signed)
NEURO HOSPITALIST CONSULT NOTE    Reason for Consult: Dizziness  HPI:                                                                                                                                          [name] is an 79 y.o. female with hx of carotid artery stenosis. hypertension, hyperlipidemia, who presents with dizziness.   Patient reports that she started having dizziness when she woke up at about 4:00 AM. She felt like she was spinning, but denies feeling room spinning around her.  She also reports seeing flashing lights and having eye ache on both sides, which disappeared spontaneously after she covered her eyes for a few seconds. Patient has hx of bilateral macular degeneration, but no new vision change. Patient then was able to go to the bathroom however while in the bathroom she had another episode of similar symptoms. She felt like something was pulling her to the right side, and then fell on the ground without injuring herself. She did not lose consciousness, did not have incontinence. Patient denies hearing loss, ringing in ears, palpitation, chest pain and shortness of breath. Patient was taken to the emergency room by her sister. After arrival to ED, she had 2 more similar episodes. She feels like her symptoms was worse when she turns to the right side. CT head done in ED has no acute abnormalities.   Of note, patient has known history of carotid restenosis. She has been following up with Dr. Myra Gianotti. Patient has not had surgical intervention for this. She did not have a history of stroke or TIA in the past. Her previous ultrasound conducted in July 2014 showing an 80-99% right internal carotid artery stenosis with a 40-59% left internal carotid stenosis. Right external carotid artery appeared occluded. Patient also has left subclavian artery stenosis.  ROS:  Denies fever, chills, headaches,  cough, chest pain, SOB, abdominal pain, diarrhea, constipation,  dysuria, urgency, frequency, hematuria.   Past Medical History  Diagnosis Date  . Hypertension     intolerant to Maxzide due to hyponatremia, intolerant to Clonidine due to slow HR/fatigue  . Hyperlipidemia   . Carotid artery occlusion   . Macular degeneration     In both eyes  . Arthritis   . Hiatal hernia   . Chronic kidney disease   . DVT (deep venous thrombosis)   . Anemia   . Irregular heartbeat   . Family history of anesthesia complication     daughter has nausea  . Coronary artery disease     Past Surgical History  Procedure Laterality Date  . Cataract extraction    . Lumbar disc surgery    . Incontinence surgery    . Abdominal hysterectomy  1971  . Neck muscle  surgery    . Varicose vein surgery      left leg  . Eye surgery    . Cholecystectomy  2004    Gall Bladder sling    Family History  Problem Relation Age of Onset  . Cancer Mother     [name]  . Diabetes Sister   . Cancer Sister   . Hyperlipidemia Sister   . Heart attack Sister   . Diabetes Son   . Hyperlipidemia Daughter     Social History:  reports that she quit smoking about 3 years ago. She has never used smokeless tobacco. She reports that she does not drink alcohol or use illicit drugs.  Allergies  Allergen Reactions  . Penicillins Rash    Oral swelling    MEDICATIONS:                                                                                                                      ROS:                                                                                                                                       History obtained from the patient  General ROS: negative for - chills, fatigue, fever, night sweats, weight gain or weight loss Psychological ROS: negative for - behavioral disorder, hallucinations, memory difficulties, mood swings or suicidal ideation Ophthalmic ROS: had eye pain, and saw flashing lights.  ENT ROS: negative for - epistaxis, nasal discharge, oral  lesions, sore throat, tinnitus or vertigo Allergy and Immunology ROS: negative for - hives or itchy/watery eyes Hematological and Lymphatic ROS: negative for - bleeding problems, bruising or swollen lymph nodes Endocrine ROS: negative for - galactorrhea, hair pattern changes, polydipsia/polyuria or temperature intolerance Respiratory ROS: negative for - cough, hemoptysis, shortness of breath or wheezing Cardiovascular ROS: negative for - chest pain, dyspnea on exertion, edema or irregular heartbeat Gastrointestinal ROS: negative for - abdominal pain, diarrhea, hematemesis, nausea/vomiting or stool incontinence Genito-Urinary ROS: negative for - dysuria, hematuria, incontinence or urinary frequency/urgency Musculoskeletal ROS: negative for - joint swelling or muscular weakness Neurological ROS: as noted in HPI Dermatological ROS: negative for rash and skin lesion changes   Blood pressure 154/45, pulse 78, temperature 98.7 F (37.1 C), temperature source Oral, resp. rate 18, height 5\' 7"  (1.702 m), weight 126 lb (57.153 kg), SpO2 98.00%.   Physical Exam:   Filed Vitals:  06/29/13 1416 06/29/13 1418 06/29/13 1422 06/29/13 1426  BP: 105/85 101/50 103/43 154/45  Pulse: 89 82 83 78  Temp:      TempSrc:      Resp:      Height:      Weight:      SpO2: 98% 97% 98%     General: Not in acute distress HEENT: PERRL, EOMI, no scleral icterus, No JVD. Has the bilateral carotid artery bruit Cardiac: S1/S2, RRR, 3/6 systolic murmur over aortic area. No gallops or rubs Pulm: Good air movement bilaterally. Clear to auscultation bilaterally. No rales, wheezing, rhonchi or rubs. Abd: Soft, nondistended, nontender, no rebound pain, no organomegaly, BS present Ext: No edema. 2+DP/PT pulse bilaterally Musculoskeletal: No joint deformities, erythema, or stiffness, ROM full Skin: No rashes.  Psych: Patient is not psychotic, no suicidal or hemocidal ideation.  Neurologic Examination:                                                                                                       Neuro: Alert and oriented X3, cranial nerves II-XII grossly intact, muscle strength 5/5 in all extremeties, sensation to light touch intact. Brachial reflex 2+ bilaterally. Knee reflex 1+ bilaterally. Negative Babinski's sign. Normal finger to nose test.   Mental Status:  Alert, oriented, thought content appropriate. Speech fluent without evidence of aphasia. Able to follow 3 step commands without difficulty.   Cranial Nerves:    II:  Visual fields grossly intact.   III/IV/VI:  Extraocular movements intact. Pupils reactive bilaterally.   V/VII:  Smile symmetric. facial light touch sensation normal bilaterally.   VIII:  Grossly intact.   IX/X:  Normal gag.   XI:  Bilateral shoulder shrug normal.   XII:  Midline tongue extension normal.   Motor:  5/5 BL upper and lower extremities.   Sensory:  Pinprick and light touch intact throughout, bilaterally.  DTRs:  2+ and symmetric throughout   Cerebellar:  Normal  finger to nose.    Plantars:  Right: downgoing Left: downgoing  Gait: unable to assess  CV: pulses palpable throughout   Results for orders placed during the hospital encounter of 06/29/13 (from the past 48 hour(s))  CBC WITH DIFFERENTIAL     Status: Abnormal   Collection Time    06/29/13  5:00 AM      Result Value Range   WBC 5.2  4.0 - 10.5 K/uL   RBC 3.16 (*) 3.87 - 5.11 MIL/uL   Hemoglobin 9.4 (*) 12.0 - 15.0 g/dL   HCT 16.1 (*) 09.6 - 04.5 %   MCV 86.7  78.0 - 100.0 fL   MCH 29.7  26.0 - 34.0 pg   MCHC 34.3  30.0 - 36.0 g/dL   RDW 40.9  81.1 - 91.4 %   Platelets 222  150 - 400 K/uL   Neutrophils Relative % 68  43 - 77 %   Neutro Abs 3.5  1.7 - 7.7 K/uL   Lymphocytes Relative 16  12 - 46 %   Lymphs Abs 0.8  0.7 - 4.0 K/uL  Monocytes Relative 10  3 - 12 %   Monocytes Absolute 0.5  0.1 - 1.0 K/uL   Eosinophils Relative 5  0 - 5 %   Eosinophils Absolute 0.3  0.0 - 0.7 K/uL   Basophils  Relative 2 (*) 0 - 1 %   Basophils Absolute 0.1  0.0 - 0.1 K/uL  BASIC METABOLIC PANEL     Status: Abnormal   Collection Time    06/29/13  5:00 AM      Result Value Range   Sodium 135  135 - 145 mEq/L   Potassium 4.1  3.5 - 5.1 mEq/L   Chloride 103  96 - 112 mEq/L   CO2 22  19 - 32 mEq/L   Glucose, Bld 108 (*) 70 - 99 mg/dL   BUN 17  6 - 23 mg/dL   Creatinine, Ser 4.09  0.50 - 1.10 mg/dL   Calcium 9.1  8.4 - 81.1 mg/dL   GFR calc non Af Amer 57 (*) >90 mL/min   GFR calc Af Amer 66 (*) >90 mL/min   Comment: (NOTE)     The eGFR has been calculated using the CKD EPI equation.     This calculation has not been validated in all clinical situations.     eGFR's persistently <90 mL/min signify possible Chronic Kidney     Disease.  URINALYSIS, ROUTINE W REFLEX MICROSCOPIC     Status: Abnormal   Collection Time    06/29/13  5:12 AM      Result Value Range   Color, Urine YELLOW  YELLOW   APPearance CLEAR  CLEAR   Specific Gravity, Urine 1.008  1.005 - 1.030   pH 7.0  5.0 - 8.0   Glucose, UA NEGATIVE  NEGATIVE mg/dL   Hgb urine dipstick NEGATIVE  NEGATIVE   Bilirubin Urine NEGATIVE  NEGATIVE   Ketones, ur NEGATIVE  NEGATIVE mg/dL   Protein, ur 914 (*) NEGATIVE mg/dL   Urobilinogen, UA 0.2  0.0 - 1.0 mg/dL   Nitrite NEGATIVE  NEGATIVE   Leukocytes, UA NEGATIVE  NEGATIVE  URINE MICROSCOPIC-ADD ON     Status: None   Collection Time    06/29/13  5:12 AM      Result Value Range   Squamous Epithelial / LPF RARE  RARE   WBC, UA 0-2  <3 WBC/hpf   RBC / HPF 0-2  <3 RBC/hpf   Bacteria, UA RARE  RARE    Ct Head Wo Contrast  06/29/2013   CLINICAL DATA:  Dizziness, seeing black spots  EXAM: CT HEAD WITHOUT CONTRAST  TECHNIQUE: Contiguous axial images were obtained from the base of the skull through the vertex without intravenous contrast.  COMPARISON:  None.  FINDINGS: No skull fracture is noted. No intracranial hemorrhage, mass effect or midline shift. Paranasal sinuses and mastoid air cells  are unremarkable. Bilateral parenchymal punctate calcification may represent sequela from prior infection. Mild cerebral atrophy. No acute cortical infarction. No mass lesion is noted on this unenhanced scan.  IMPRESSION: No acute intracranial abnormality. Mild cerebral atrophy.   Electronically Signed   By: Natasha Mead M.D.   On: 06/29/2013 08:33    Assessment and plan per attending neurologist  Assessment/Plan:  #: Dizziness: Patient's symptoms is concerning for posterior circulation TIA or stroke. She has risk factors, including hypertension, hyperlipidemia, and bilateral carotid artery stenosis. Other differential diagnoses include peripheral vertigo, but less likely, such as Mnire's disease (less likely given no tinnitus and hearing loss), benign paroxysmal positional  vertigo (though possible given patient reports worsening dizziness by turning to the right side, but it cannot explain the  possible right-sided weakness), vestibular neuritis (less likely, patient denies recent viral illness) and vestibular schwannoma (unlikely, given sudden onset). CT head is negative for intracranial bleeding. Currently patient is hemodynamically stable. Heart rate is 78, blood pressure 154/45. Oxygen saturation is normal on room air. Now patient has symptomatic carotid stenosis and may need surgical intervention. Vascular surgeon was already consulted.   -will get MRI/MRA to rule out stroke and evaluate the intracranial circulation for next plan.   Lorretta Harp, MD PGY3, Internal Medicine Teaching Service Pager: [phone]   Patient seen and examined together with physician assistant and I concur with the assessment and plan.  Wyatt Portela, MD

## 2013-06-29 NOTE — ED Notes (Signed)
Admitting md at bedside to eval pt 

## 2013-06-29 NOTE — ED Provider Notes (Signed)
CSN: [id number]     Arrival date & time 06/29/13  0425 History   First MD Initiated Contact with Patient 06/29/13 [phone]     Chief Complaint  Patient presents with  . Dizziness   (Consider location/radiation/quality/duration/timing/severity/associated sxs/prior Treatment) HPI  79 year old female with dizziness visual changes. Onset tonight. Patient was laying in bed with her head turned to the right side when she began having visual changes which she describes as dark spots. She's not sure if only in R eye or both. She laid in bed until the symptoms improved before getting up because she needed to use the bathroom. She was ambulating to the bathroom when she began having the same visual changes and felt off balance. The symptoms persisted as she was sitting on the toilet and she actually fell off off. She denies any headaches, neck pain, chest pain or pain anywhere else. Denies vertigo. No acute numbness, tingling or loss of strength. No change in her speech. No confusion per her daughter    Past Medical History  Diagnosis Date  . Hypertension     intolerant to Maxzide due to hyponatremia, intolerant to Clonidine due to slow HR/fatigue  . Hyperlipidemia   . Carotid artery occlusion   . Macular degeneration     In both eyes  . Arthritis   . Hiatal hernia   . Chronic kidney disease   . DVT (deep venous thrombosis)   . Anemia   . Irregular heartbeat    Past Surgical History  Procedure Laterality Date  . Cataract extraction    . Lumbar disc surgery    . Incontinence surgery    . Abdominal hysterectomy  1971  . Neck muscle surgery    . Varicose vein surgery      left leg  . Eye surgery    . Cholecystectomy  2004    Gall Bladder sling   Family History  Problem Relation Age of Onset  . Cancer Mother     [name]  . Diabetes Sister   . Cancer Sister   . Hyperlipidemia Sister   . Heart attack Sister   . Diabetes Son   . Hyperlipidemia Daughter    History  Substance Use Topics   . Smoking status: Former Smoker    Quit date: 08/24/2009  . Smokeless tobacco: Never Used  . Alcohol Use: No   OB History   Grav Para Term Preterm Abortions TAB SAB Ect Mult Living                 Review of Systems  All systems reviewed and negative, other than as noted in HPI.   Allergies  Penicillins and Aspirin  Home Medications   Current Outpatient Rx  Name  Route  Sig  Dispense  Refill  . amLODipine (NORVASC) 10 MG tablet      TAKE ONE TABLET BY MOUTH ONE TIME DAILY   30 tablet   4   . aspirin 81 MG tablet   Oral   Take 81 mg by mouth daily.           Marland Kitchen atorvastatin (LIPITOR) 10 MG tablet   Oral   Take 1 tablet (10 mg total) by mouth daily.   30 tablet   5   . Calcium Citrate (CALCITRATE PO)   Oral   Take by mouth. 2 tabs daily          . hydrALAZINE (APRESOLINE) 25 MG tablet   Oral   Take 1.5 tablets (37.5  mg total) by mouth 2 (two) times daily.   135 tablet   3   . hydrALAZINE (APRESOLINE) 25 MG tablet      TAKE 1 AND 1/2 TABLETS (37.5MG ) BY MOUTH TWICE A DAY AS INSTRUCTED   270 tablet   2   . ibuprofen (ADVIL,MOTRIN) 200 MG tablet   Oral   Take 200 mg by mouth every 6 (six) hours as needed.           Marland Kitchen lisinopril (PRINIVIL,ZESTRIL) 40 MG tablet   Oral   Take 1 tablet (40 mg total) by mouth daily.   30 tablet   5   . Multiple Vitamins-Minerals (PRESERVISION AREDS) CAPS   Oral   Take by mouth daily.         Marland Kitchen VITAMIN D, CHOLECALCIFEROL, PO   Oral   Take by mouth. 2 TABS DAILY (2000 UNITS DAILY)           BP 163/53  Pulse 79  Temp(Src) 98 F (36.7 C) (Oral)  Resp 18  Ht 5\' 7"  (1.702 m)  Wt 126 lb (57.153 kg)  BMI 19.73 kg/m2  SpO2 99% Physical Exam  Nursing note and vitals reviewed. Constitutional: She is oriented to person, place, and time. She appears well-developed and well-nourished. No distress.  HENT:  Head: Normocephalic and atraumatic.  Eyes: Conjunctivae and EOM are normal. Pupils are equal, round, and  reactive to light. Right eye exhibits no discharge. Left eye exhibits no discharge.  Neck: Neck supple.  Cardiovascular: Normal rate and regular rhythm.  Exam reveals no gallop and no friction rub.   Murmur heard. Systolic murmur which radiates to the carotids  Pulmonary/Chest: Effort normal and breath sounds normal. No respiratory distress.  Abdominal: Soft. She exhibits no distension. There is no tenderness.  Musculoskeletal: She exhibits no edema and no tenderness.  Neurological: She is alert and oriented to person, place, and time. No cranial nerve deficit. She exhibits normal muscle tone. Coordination normal.  Speech is clear. Content is appropriate. Mildly hard of hearing, cranial nerves otherwise intact. Good finger nose testing bilaterally.  Skin: Skin is warm and dry.  Small skin tear to right. No bony tenderness. In pain with range of motion of the elbow.  Psychiatric: She has a normal mood and affect. Her behavior is normal. Thought content normal.    ED Course  Procedures (including critical care time) Labs Review Labs Reviewed  CBC WITH DIFFERENTIAL - Abnormal; Notable for the following:    RBC 3.16 (*)    Hemoglobin 9.4 (*)    HCT 27.4 (*)    Basophils Relative 2 (*)    All other components within normal limits  BASIC METABOLIC PANEL - Abnormal; Notable for the following:    Glucose, Bld 108 (*)    GFR calc non Af Amer 57 (*)    GFR calc Af Amer 66 (*)    All other components within normal limits  URINALYSIS, ROUTINE W REFLEX MICROSCOPIC - Abnormal; Notable for the following:    Protein, ur 100 (*)    All other components within normal limits  URINE MICROSCOPIC-ADD ON   Imaging Review No results found.  EKG Interpretation   None      EKG:  Rhythm: normal sinus Rate: 69 PR: 198 ms QRS: 74 ms QTc:454 ms ST segments: twi inferiorly and anteriorly   MDM   1. Visual changes   2. Carotid artery stenosis, right     79 year old female with visual  changes  and feeling off balance. Currently asymptomatic laying in bed. She has a nonfocal neurological examination. Symptoms are positional and reproducible with turning head. Could be vertigo, but eye symptoms concerning. Symptoms with turning neck could be from vertebrobasilar insufficiency, but she has known 80% right carotid stenosis. She also has elevated velocities within her right innominate artery and right subclavian artery as well as left subclavian artery. I feel her underlying arterial disease is the most plausible explanation for her symptoms at this point. She is followed by vascular surgery but,  given the complexity of possible intervention and lack of symptoms, she has been monitored up to this point. She may be reaching the point where she is now becoming symptomatic. Due diligence in the emergency room by checking for other potentially correctable causes such as dysrhythmia, significant anemia or electrolyte issues although my suspicion for these is pretty low. If workup is unremarkable, we'll try ambulating. If she remains significantly symptomatic, I feel the most prudent course of action would be to admit her as she has already fallen once and at high risk for repeat falls.    7:20 AM Discussed with Dr Hart Rochester, vascular surgery. [facility] has some cases today and may have a chance to see pt, if not, Dr Imogene Burn should be available.   Raeford Razor, MD 06/29/13 [phone]

## 2013-06-29 NOTE — ED Notes (Signed)
Pt also states the she felt that she was leaning to the right when she was walking and wanting to go to the right

## 2013-06-29 NOTE — ED Notes (Signed)
MD at bedside. 

## 2013-06-29 NOTE — Consult Note (Signed)
[facility] [place] [place] NOTE   MRN : [id number]  Reason for Consult: Dizzy spells in pt with known innominant and right Carotid stenosis Referring Physician: Edsel Petrin, DO  History of Present Illness: [name] is a 79 y.o. female with hx HTN, CKD, who has been followed by Dr. 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 She also has elevated velocities within her right innominate artery and right subclavian artery as well as left subclavian artery.   Last PM she awoke with dizziness when she turned to the right and visual changes in both eyes - "I was afraid I was going blind."  She has known macular degeneration. She got up to the bathroom had another dizzy spell and then fell off toilet. She denies LOC. Pt. denies weakness; BUE/BLE;  pt reports symptoms of bilateral visual changes. Pt. reports normal speech pattern - family did not note any speech changes during last PM episode Pt. States that the symptoms  are worse if she turns her head to the right.  She had CTA of the neck in Jan of 2014 which showed: Right carotid: Extensive atherosclerotic plaque in the common  carotid artery without significant stenosis. Heavily calcified  plaque of the right carotid bifurcation. Direct measurement of  the lumen diameter is difficult due to heavy calcification. I  would estimate this is a 75% diameter stenosis and is unchanged  from the prior study. Severe stenosis at the origin of the right  external carotid artery is unchanged.      Current Facility-Administered Medications  Medication Dose Route Frequency Provider Last Rate Last Dose  . 0.9 %  sodium chloride infusion  250 mL Intravenous PRN Maryann Mikhail, DO      . acetaminophen (TYLENOL) tablet 650 mg  650 mg Oral Q6H PRN Maryann Mikhail, DO       Or  . acetaminophen (TYLENOL) suppository 650 mg  650 mg Rectal Q6H PRN Maryann  Mikhail, DO      . amLODipine (NORVASC) tablet 10 mg  10 mg Oral Daily Maryann Mikhail, DO   10 mg at 06/29/13 1038  . aspirin chewable tablet 81 mg  81 mg Oral Daily Maryann Mikhail, DO   81 mg at 06/29/13 1043  . atorvastatin (LIPITOR) tablet 10 mg  10 mg Oral Daily Maryann Mikhail, DO      . cholecalciferol (VITAMIN D) tablet 1,000 Units  1,000 Units Oral Daily Maryann Mikhail, DO      . docusate sodium (COLACE) capsule 100 mg  100 mg Oral BID Maryann Mikhail, DO      . hydrALAZINE (APRESOLINE) tablet 37.5 mg  37.5 mg Oral BID Maryann Mikhail, DO   37.5 mg at 06/29/13 1037  . HYDROcodone-acetaminophen (NORCO/VICODIN) 5-325 MG per tablet 1-2 tablet  1-2 tablet Oral Q4H PRN Edsel Petrin, DO   1 tablet at 06/29/13 1231  . lisinopril (PRINIVIL,ZESTRIL) tablet 40 mg  40 mg Oral Daily Maryann Mikhail, DO   40 mg at 06/29/13 1039  . morphine 2 MG/ML injection 2 mg  2 mg Intravenous Q4H PRN Maryann Mikhail, DO      . multivitamin-lutein (OCUVITE-LUTEIN) capsule 1 capsule  1 capsule Oral Daily Maryann Mikhail, DO      . ondansetron (ZOFRAN) tablet 4 mg  4 mg Oral Q6H PRN Maryann Mikhail, DO       Or  . ondansetron (ZOFRAN) injection 4 mg  4 mg Intravenous Q6H PRN  Maryann Mikhail, DO      . senna (SENOKOT) tablet 8.6 mg  1 tablet Oral BID Maryann Mikhail, DO      . sodium chloride 0.9 % injection 3 mL  3 mL Intravenous Q12H Maryann Mikhail, DO      . sodium chloride 0.9 % injection 3 mL  3 mL Intravenous Q12H Maryann Mikhail, DO      . sodium chloride 0.9 % injection 3 mL  3 mL Intravenous PRN Maryann Mikhail, DO        Pt meds include: Statin :Yes Betablocker: No ASA: Yes Other anticoagulants/antiplatelets: none  Past Medical History  Diagnosis Date  . Hypertension     intolerant to Maxzide due to hyponatremia, intolerant to Clonidine due to slow HR/fatigue  . Hyperlipidemia   . Carotid artery occlusion   . Macular degeneration     In both eyes  . Arthritis   . Hiatal hernia   .  Chronic kidney disease   . DVT (deep venous thrombosis)   . Anemia   . Irregular heartbeat   . Family history of anesthesia complication     daughter has nausea  . Coronary artery disease     Past Surgical History  Procedure Laterality Date  . Cataract extraction    . Lumbar disc surgery    . Incontinence surgery    . Abdominal hysterectomy  1971  . Neck muscle surgery    . Varicose vein surgery      left leg  . Eye surgery    . Cholecystectomy  2004    Gall Bladder sling    Social History History  Substance Use Topics  . Smoking status: Former Smoker    Quit date: 08/24/2009  . Smokeless tobacco: Never Used  . Alcohol Use: No    Family History Family History  Problem Relation Age of Onset  . Cancer Mother     [name]  . Diabetes Sister   . Cancer Sister   . Hyperlipidemia Sister   . Heart attack Sister   . Diabetes Son   . Hyperlipidemia Daughter     Allergies  Allergen Reactions  . Penicillins Rash    Oral swelling     REVIEW OF SYSTEMS  General: [ ]  Weight loss, [ ]  Fever, [ ]  chills Neurologic: [x ] Dizziness, [ ]  Blackouts, [ ]  Seizure [ ]  Stroke, [ ]  "Mini stroke", [ ]  Slurred speech, [ ]  Temporary blindness; [ ]  weakness in arms or legs, [ ]  Hoarseness [ ]  Dysphagia Cardiac: [ ]  Chest pain/pressure, [ ]  Shortness of breath at rest [ ]  Shortness of breath with exertion, [ ]  Atrial fibrillation or irregular heartbeat  Vascular: [ ]  Pain in legs with walking, [ ]  Pain in legs at rest, [ ]  Pain in legs at night,  [ ]  Non-healing ulcer, [ ]  Blood clot in vein/DVT,   Pulmonary: [ ]  Home oxygen, [ ]  Productive cough, [ ]  Coughing up blood, [ ]  Asthma,  [ ]  Wheezing [ ]  COPD Musculoskeletal:  [x ] Arthritis, [ x] Low back pain, [x ] Joint pain Hematologic: [ ]  Easy Bruising, [ ]  Anemia; [ ]  Hepatitis Gastrointestinal: [ ]  Blood in stool, [ ]  Gastroesophageal Reflux/heartburn, Urinary: [x ] chronic Kidney disease, [ ]  on HD - [ ]  MWF or [ ]  TTHS, [ ]   Burning with urination, [ ]  Difficulty urinating Skin: [ ]  Rashes, [ ]  Wounds Psychological: [ ]  Anxiety, [ ]  Depression  Physical Examination  Filed Vitals:   06/29/13 0634 06/29/13 0907 06/29/13 0910 06/29/13 0944  BP: 153/56 127/69  150/54  Pulse: 72 74  66  Temp:   98.4 F (36.9 C) 98.6 F (37 C)  TempSrc:    Oral  Resp: 16 26  20   Height:    5\' 7"  (1.702 m)  Weight:      SpO2:  99%  97%   Body mass index is 19.73 kg/(m^2).  General:  WDWN in NAD HENT: WNL Eyes: Pupils equal Pulmonary: normal non-labored breathing Cardiac: RRR, with SEM Abdomen: soft, NT, no masses Skin: no rashes, ulcers noted;  no Gangrene , no cellulitis; no open wounds;  Vascular Exam/Pulses: 2+ palp radial pulses bilaterally, 1+ ulnar artery palp pulses Both feet warm and pink - no palp distal pulses noted Musculoskeletal: no muscle wasting or atrophy; no edema  Neurologic: A&O X 3; Appropriate Affect ;  SENSATION: normal; MOTOR FUNCTION: 5/5 Symmetric Speech is fluent/normal   Significant Diagnostic Studies: CBC Lab Results  Component Value Date   WBC 5.2 06/29/2013   HGB 9.4* 06/29/2013   HCT 27.4* 06/29/2013   MCV 86.7 06/29/2013   PLT 222 06/29/2013    BMET    Component Value Date/Time   NA 135 06/29/2013 0500   K 4.1 06/29/2013 0500   CL 103 06/29/2013 0500   CO2 22 06/29/2013 0500   GLUCOSE 108* 06/29/2013 0500   BUN 17 06/29/2013 0500   CREATININE 0.93 06/29/2013 0500   CREATININE 0.94 02/17/2013 0947   CALCIUM 9.1 06/29/2013 0500   GFRNONAA 57* 06/29/2013 0500   GFRAA 66* 06/29/2013 0500   Estimated Creatinine Clearance: 43.6 ml/min (by C-G formula based on Cr of 0.93).  COAG Lab Results  Component Value Date   INR 0.9 04/21/2007   INR 1.0 03/22/2007    ASSESSMENT/PLAN: [name] is a 79 y.o. female with hx HTN, CKD, who has been followed by Dr. 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 New episode of  dizziness and bilateral visual changes - ? Vertigo vs TIA related to carotid stenosis   CT of the head was negative Will order MRI of head Neurology consult called This is a complex surgical situation with innominate stenosis and severe right carotid stenosis Will follow   ROCZNIAK,REGINA J 06/29/2013 2:15 PM  I agree with the above.  The patient is well known to me.  I have followed her for quite some time with asymptomatic right carotid stenosis.  She has stenosis within her innominate artery as well which was last imaged on CT scan and found to be about 50%.  She is borderline 80% right carotid stenosis, however because she has remained asymptomatic and I had contemplated a dressing her innominate artery simultaneously, we have been following and treating her medically.  She was admitted to the hospital with the above events.  Her biggest complaint has been that of vision changes within both eyes.  She also feels that she is spinning and pulse to the right.  Currently by physical examination she is neurologically intact.  The patient's symptoms do not correlate with a stroke within the distribution of the right carotid artery, however because of the known carotid disease is certainly needs to be entertained.  I recommended getting a MRI to see whether or not she has had a stroke.  In addition neurologic consultation would be of benefit.  I'll continue to follow patient while she the hospital.  Annamarie Major

## 2013-06-29 NOTE — Care Management (Signed)
UR Completed Keirstan Iannello Graves-Bigelow, RN,BSN [phone]  

## 2013-06-29 NOTE — ED Notes (Signed)
Patient transported to CT 

## 2013-06-30 ENCOUNTER — Encounter (HOSPITAL_COMMUNITY): Payer: Self-pay | Admitting: Radiology

## 2013-06-30 ENCOUNTER — Inpatient Hospital Stay (HOSPITAL_COMMUNITY): Payer: Medicare Other

## 2013-06-30 DIAGNOSIS — I6529 Occlusion and stenosis of unspecified carotid artery: Principal | ICD-10-CM

## 2013-06-30 DIAGNOSIS — R011 Cardiac murmur, unspecified: Secondary | ICD-10-CM

## 2013-06-30 DIAGNOSIS — H539 Unspecified visual disturbance: Secondary | ICD-10-CM

## 2013-06-30 DIAGNOSIS — I517 Cardiomegaly: Secondary | ICD-10-CM

## 2013-06-30 DIAGNOSIS — R42 Dizziness and giddiness: Secondary | ICD-10-CM

## 2013-06-30 LAB — BASIC METABOLIC PANEL
CO2: 22 mEq/L (ref 19–32)
Calcium: 8.6 mg/dL (ref 8.4–10.5)
Chloride: 106 mEq/L (ref 96–112)
Creatinine, Ser: 0.89 mg/dL (ref 0.50–1.10)
GFR calc Af Amer: 69 mL/min — ABNORMAL LOW (ref >90)
Potassium: 4.5 mEq/L (ref 3.5–5.1)
Sodium: 136 mEq/L (ref 135–145)

## 2013-06-30 LAB — VITAMIN B12: Vitamin B-12: 300 pg/mL (ref 211–911)

## 2013-06-30 LAB — CBC
HCT: 25.4 % — ABNORMAL LOW (ref 36.0–46.0)
MCV: 87.3 fL (ref 78.0–100.0)
Platelets: 208 10*3/uL (ref 150–400)
RBC: 2.91 MIL/uL — ABNORMAL LOW (ref 3.87–5.11)
RDW: 14.2 % (ref 11.5–15.5)
WBC: 5.5 10*3/uL (ref 4.0–10.5)

## 2013-06-30 LAB — FOLATE: Folate: 12.7 ng/mL

## 2013-06-30 LAB — RETICULOCYTES
RBC.: 3.2 MIL/uL — ABNORMAL LOW (ref 3.87–5.11)
Retic Count, Absolute: 35.2 10*3/uL (ref 19.0–186.0)
Retic Ct Pct: 1.1 % (ref 0.4–3.1)

## 2013-06-30 LAB — IRON AND TIBC: UIBC: 195 ug/dL (ref 125–400)

## 2013-06-30 MED ORDER — IOHEXOL 350 MG/ML SOLN
50.0000 mL | Freq: Once | INTRAVENOUS | Status: AC | PRN
  Administered 2013-06-30: 50 mL via INTRAVENOUS

## 2013-06-30 NOTE — Progress Notes (Signed)
[facility] [place]  Subjective  -   The patient reports no interval change overnight.  She has not had any other episodes.   Physical Exam:  Cardiovascular: Regular rate rhythm Pulmonary: Respirations are nonlabored Neuro, she remains neurologically intact without focal deficits.  Diagnostic imaging: Carotid duplex was repeated today.  This shows no significant change from this summer.  The right side is borderline 80% and the left side remains 40-59%.    MRI: No acute intracranial abnormality was identified.  There was question of a focal stenosis vs. possible dissection within the basilar artery.     Assessment/Plan:    The patient is scheduled for CT angiogram today to further evaluate the abnormality within her basilar artery.  I get I do not think her symptoms correlate with anterior circulation problems which would be related to her carotid artery.  I feel that she still remained asymptomatic from her right carotid perspective.  If all other medical possibilities for her symptoms have been excluded, I would consider proceeding with right carotid endarterectomy, however her workup is in place and therefore we will continue to follow her.  Cohan Stipes IV, V. WELLS 06/30/2013 12:49 PM --  Filed Vitals:   06/30/13 0955  BP: 144/64  Pulse:   Temp:   Resp:     Intake/Output Summary (Last 24 hours) at 06/30/13 1249 Last data filed at 06/30/13 0958  Gross per 24 hour  Intake    243 ml  Output      2 ml  Net    241 ml     Laboratory CBC    Component Value Date/Time   WBC 5.5 06/30/2013 0341   HGB 8.6* 06/30/2013 0341   HCT 25.4* 06/30/2013 0341   PLT 208 06/30/2013 0341    BMET    Component Value Date/Time   NA 136 06/30/2013 0341   K 4.5 06/30/2013 0341   CL 106 06/30/2013 0341   CO2 22 06/30/2013 0341   GLUCOSE 94 06/30/2013 0341   BUN 12 06/30/2013 0341   CREATININE 0.89 06/30/2013 0341   CREATININE 0.94 02/17/2013 0947   CALCIUM 8.6  06/30/2013 0341   GFRNONAA 60* 06/30/2013 0341   GFRAA 69* 06/30/2013 0341    COAG Lab Results  Component Value Date   INR 0.9 04/21/2007   INR 1.0 03/22/2007   No results found for this basename: PTT    Antibiotics Anti-infectives   None       V. Charlena Cross, M.D. [facility] [place] Office: [phone] Pager:  [phone]

## 2013-06-30 NOTE — Progress Notes (Signed)
  Echocardiogram 2D Echocardiogram has been performed.  Georgian Co 06/30/2013, 4:10 PM

## 2013-06-30 NOTE — Progress Notes (Addendum)
VASCULAR LAB PRELIMINARY  PRELIMINARY  PRELIMINARY  PRELIMINARY  Carotid duplex completed.    Preliminary report:  Right - 80% to 99% ICA stenosis. Left - 40% to 59% ICA stenosis upper end of scale. Bilateral ECA stenosis left greater than right. Left subclavian stenosis. Bilateral vertebral artery flow is antegrade with abnormal Doppler waveforms. No significant change since study of 03/20/2013 performed at [facility]  Maryuri Warnke, RVS 06/30/2013, 10:41 AM

## 2013-06-30 NOTE — Evaluation (Signed)
Physical Therapy Evaluation Patient Details Name: [name] MRN: [id number] DOB: [birth year] Today's Date: 06/30/2013 Time: 8295-6213 PT Time Calculation (min): 44 min  PT Assessment / Plan / Recommendation History of Present Illness  Patient is an 79 y/o female admitted with dizziness and fall off toilet at home.  Clinical Impression  Patient presents with right posterior canal BPPV and symptoms of vertebral artery insufficiency.  Patient demonstrated decreased balance after treatment and may need continued vestibular rehab at outpatient neurorehab center.  Feel a walker for home will also improve safety and allow pt to be more independent initially while continuing through process of recovery.  PT to follow acutely.      PT Assessment  Patient needs continued PT services    Follow Up Recommendations  Outpatient PT [facility])    Does the patient have the potential to tolerate intense rehabilitation    N/A  Barriers to Discharge  None      Equipment Recommendations  Rolling walker with 5" wheels    Recommendations for Other Services   None  Frequency Min 3X/week    Precautions / Restrictions Precautions Precautions: Fall   Pertinent Vitals/Pain No pain complaints      Mobility  Bed Mobility Bed Mobility: Supine to Sit;Sit to Supine Supine to Sit: 4: Min assist;HOB flat Sit to Supine: HOB elevated;5: Supervision Details for Bed Mobility Assistance: assisted due to dizziness with sitting up after performing Eply's x 2 Transfers Transfers: Sit to Stand;Stand to Sit Sit to Stand: 4: Min guard;From bed Stand to Sit: To bed;4: Min assist Details for Transfer Assistance: assist for safety and due to pt c/o imbalance after canalith repositioning Ambulation/Gait Ambulation/Gait Assistance: 4: Min guard;5: Supervision Ambulation Distance (Feet): 130 Feet Assistive device: Rolling walker Ambulation/Gait Assistance Details: cues for stationary target  with eyes ahead; demonstrates some initial unsteadiness and hesitancy Gait Pattern: Step-through pattern;Decreased stride length Stairs: No    Vestibular  Oculomotor Exam: demonstrates saccadic smooth pursuits (with h/o macular degeneration,) without gaze holding nystagmus.  Able to perform vestibular ocular reflex without c/o dizziness Positional testing: modified dix hallpike to right positive for rotarty nystagmus lasting < 30 seconds; performed Eply's for repositioning x 2 without seeing any more rotary nystagmus; had mild low intensity left beat nystagmus in position 2 of Eply's and had mild downbeating with positioning in full hallpike position during second maneuver.     PT Diagnosis: Abnormality of gait;Other (comment) (BPPV)  PT Problem List: Decreased activity tolerance;Decreased balance;Decreased mobility;Decreased knowledge of use of DME;Other (comment) (dizziness) PT Treatment Interventions: DME instruction;Balance training;Gait training;Neuromuscular re-education;Functional mobility training;Patient/family education;Stair training;Therapeutic activities;Therapeutic exercise;Other (comment) (vestibular rehab)     PT Goals(Current goals can be found in the care plan section) Acute Rehab PT Goals Patient Stated Goal: To go home tomorrow and be rid of dizziness PT Goal Formulation: With patient/family Time For Goal Achievement: 07/14/13 Potential to Achieve Goals: Good  Visit Information  Last PT Received On: 06/30/13 Assistance Needed: +1 History of Present Illness: Patient is an 79 y/o female admitted with dizziness and fall off toilet at home.       Prior Functioning  Home Living Family/patient expects to be discharged to:: Private residence Living Arrangements: Children Available Help at Discharge: Family Type of Home: House Home Layout: Two level;Bed/bath upstairs Alternate Level Stairs-Number of Steps: 13 Alternate Level Stairs-Rails: Left Home Equipment: Cane -  single point Additional Comments: daughter wants to move pt downstairs, but pt not wanting to move (also has her  home in one level apartment, but stays most of the time with daughter) Prior Function Level of Independence: Independent Communication Communication: No difficulties Dominant Hand: Right    Cognition  Cognition Arousal/Alertness: Awake/alert Behavior During Therapy: WFL for tasks assessed/performed Overall Cognitive Status: Within Functional Limits for tasks assessed    Extremity/Trunk Assessment Lower Extremity Assessment Lower Extremity Assessment: Overall WFL for tasks assessed   Balance Balance Balance Assessed: Yes Static Standing Balance Static Standing - Balance Support: Bilateral upper extremity supported Static Standing - Level of Assistance: 5: Stand by assistance  End of Session PT - End of Session Equipment Utilized During Treatment: Gait belt Activity Tolerance: Other (comment) (c/o dizziness with treatment) Patient left: in bed;with family/visitor present;with call bell/phone within reach  GP     [facility] 06/30/2013, 3:38 PM [place], [place] [phone] 06/30/2013

## 2013-06-30 NOTE — Progress Notes (Signed)
Patient ID: [name]  female  NFA:[id number]    DOB: 06/23/1931    DOA: 06/29/2013  PCP: Bennie Pierini, FNP  Assessment/Plan: Principal Problem:   Dizziness: Differential diagnoses include posterior circulation TIA/  vs bilateral carotid artery disease, aortic insufficiency, possibly vertigo - MRI of the brain showed no acute infarct or hemorrhage - MRA mild to moderate irregularity of the proximal basilar artery, question focal stenosis versus localized dissection versus asymmetric fenestration - Discussed in detail with neurology, Dr. Cyril Mourning, Will obtain CT angiogram of the head and neck. Patient also reported worsening dizziness on stretching her neck or raising arms to reach over or pick up something above from the kitchen, ordered CT of the C-spine - Patient has 3/6 systolic murmur, patent foramen ovale on the previous echo in 2002, repeat echo to rule out any aortic insufficiency - Continue aspirin and Lipitor, BP control - Carotid Dopplers were done today however the report needs to be fixed, patient has known right carotid artery disease. Vascular surgery was consulted yesterday  Active Problems:   DYSLIPIDEMIA - Continue Lipitor    HYPERTENSION: Stable    CAROTID STENOSIS: As #1    Murmur- 2-D echo pending    DVT Prophylaxis:  Code Status:  Disposition: Likely tomorrow    Subjective: Patient feels better, no dizziness no lightheadedness or chest pain or shortness of breath at this time, and daughter at bedside  Objective: Weight change:   Intake/Output Summary (Last 24 hours) at 06/30/13 1119 Last data filed at 06/30/13 0958  Gross per 24 hour  Intake    243 ml  Output      2 ml  Net    241 ml   Blood pressure 144/64, pulse 63, temperature 98.7 F (37.1 C), temperature source Oral, resp. rate 18, height 5\' 7"  (1.702 m), weight 57.153 kg (126 lb), SpO2 98.00%.  Physical Exam: General: Alert and awake, oriented x3, not in any acute  distress. HEENT: anicteric sclera, PERLA, EOMI, + carotid bruit bilaterally CVS: S1-S2 clear, 3/6 SEM Chest: clear to auscultation bilaterally, no wheezing, rales or rhonchi Abdomen: soft nontender, nondistended, normal bowel sounds  Extremities: no cyanosis, clubbing or edema noted bilaterally Neuro: Cranial nerves II-XII intact, no focal neurological deficits  Lab Results: Basic Metabolic Panel:  Recent Labs Lab 06/29/13 0500 06/30/13 0341  NA 135 136  K 4.1 4.5  CL 103 106  CO2 22 22  GLUCOSE 108* 94  BUN 17 12  CREATININE 0.93 0.89  CALCIUM 9.1 8.6   Liver Function Tests: No results found for this basename: AST, ALT, ALKPHOS, BILITOT, PROT, ALBUMIN,  in the last 168 hours No results found for this basename: LIPASE, AMYLASE,  in the last 168 hours No results found for this basename: AMMONIA,  in the last 168 hours CBC:  Recent Labs Lab 06/29/13 0500 06/30/13 0341  WBC 5.2 5.5  NEUTROABS 3.5  --   HGB 9.4* 8.6*  HCT 27.4* 25.4*  MCV 86.7 87.3  PLT 222 208   Cardiac Enzymes: No results found for this basename: CKTOTAL, CKMB, CKMBINDEX, TROPONINI,  in the last 168 hours BNP: No components found with this basename: POCBNP,  CBG: No results found for this basename: GLUCAP,  in the last 168 hours   Micro Results: No results found for this or any previous visit (from the past 240 hour(s)).  Studies/Results: Ct Head Wo Contrast  06/29/2013   CLINICAL DATA:  Dizziness, seeing black spots  EXAM: CT HEAD WITHOUT CONTRAST  TECHNIQUE: Contiguous axial images were obtained from the base of the skull through the vertex without intravenous contrast.  COMPARISON:  None.  FINDINGS: No skull fracture is noted. No intracranial hemorrhage, mass effect or midline shift. Paranasal sinuses and mastoid air cells are unremarkable. Bilateral parenchymal punctate calcification may represent sequela from prior infection. Mild cerebral atrophy. No acute cortical infarction. No mass lesion  is noted on this unenhanced scan.  IMPRESSION: No acute intracranial abnormality. Mild cerebral atrophy.   Electronically Signed   By: Natasha Mead M.D.   On: 06/29/2013 08:33   Mr Maxine Glenn Head Wo Contrast  06/30/2013   CLINICAL DATA:  Dizziness. History of carotid artery stenosis. Stroke risk factors include hypertension, and hyperlipidemia.  EXAM: MRI HEAD WITHOUT CONTRAST  MRA HEAD WITHOUT CONTRAST  TECHNIQUE: Multiplanar, multiecho pulse sequences of the brain and surrounding structures were obtained without intravenous contrast. Angiographic images of the head were obtained using MRA technique without contrast.  COMPARISON:  CT head 06/29/2013 is unremarkable. Carotid Dopplers from 04/05/2007 revealed bilateral carotid artery stenosis not clearly flow reducing.  FINDINGS: MRI HEAD FINDINGS  No evidence for acute infarction, hemorrhage, mass lesion, hydrocephalus, or extra-axial fluid. Mild to moderate age-related cerebral and cerebellar atrophy. Moderate small vessel type changes throughout the periventricular and subcortical white matter. No large vessel or significant lacunar infarct.  Flow voids are maintained throughout the carotid, basilar, and vertebral arteries. There are no areas of chronic hemorrhage. Possible subcentimeter T2 bright lesion right pituitary gland likely incidental cyst. No elevation of the pituitary contour or, cavernous sinus, or suprasellar mass lesion. Normal cerebellar tonsils and upper cervical region  Visualized calvarium, skull base, and upper cervical osseous structures unremarkable. Scalp and extracranial soft tissues, orbits, sinuses, and mastoids show no acute process.  MRA HEAD FINDINGS  Mild non stenotic irregularity both internal carotid arteries. Slight medial outpouching left cavernous ICA could be atheromatous aneurysmal dilatation. No visible intracranial berry aneurysm.  Moderate narrowing of the proximal anterior cerebral artery on the right unlikely to be clinically  significant with a normal and dominant left A1 ACA. No proximal MCA stenosis. Intact circle of Willis as with bilateral patent posterior communicating arteries.  Mild to moderate irregularity of the proximal basilar artery. Question focal stenosis versus asymmetric sensation versus localized dissection. Right vertebral is the dominant contributor with left vertebral smaller but patent. No PCA stenosis. No cerebellar branch occlusion.  IMPRESSION: No acute intracranial abnormality. Mild to moderate age-related atrophy and chronic microvascular ischemic change.  Possible subcentimeter lesion within the right pituitary gland, likely incidental cyst.  Mild to moderate irregularity of the proximal basilar artery; question focal stenosis versus asymmetric fenestration versus localized dissection. Consider CTA head neck for further evaluation.   Electronically Signed   By: Davonna Belling M.D.   On: 06/30/2013 09:55   Mr Brain Wo Contrast  06/30/2013   CLINICAL DATA:  Dizziness. History of carotid artery stenosis. Stroke risk factors include hypertension, and hyperlipidemia.  EXAM: MRI HEAD WITHOUT CONTRAST  MRA HEAD WITHOUT CONTRAST  TECHNIQUE: Multiplanar, multiecho pulse sequences of the brain and surrounding structures were obtained without intravenous contrast. Angiographic images of the head were obtained using MRA technique without contrast.  COMPARISON:  CT head 06/29/2013 is unremarkable. Carotid Dopplers from 04/05/2007 revealed bilateral carotid artery stenosis not clearly flow reducing.  FINDINGS: MRI HEAD FINDINGS  No evidence for acute infarction, hemorrhage, mass lesion, hydrocephalus, or extra-axial fluid. Mild to moderate age-related cerebral and cerebellar atrophy. Moderate small vessel type changes  throughout the periventricular and subcortical white matter. No large vessel or significant lacunar infarct.  Flow voids are maintained throughout the carotid, basilar, and vertebral arteries. There are no  areas of chronic hemorrhage. Possible subcentimeter T2 bright lesion right pituitary gland likely incidental cyst. No elevation of the pituitary contour or, cavernous sinus, or suprasellar mass lesion. Normal cerebellar tonsils and upper cervical region  Visualized calvarium, skull base, and upper cervical osseous structures unremarkable. Scalp and extracranial soft tissues, orbits, sinuses, and mastoids show no acute process.  MRA HEAD FINDINGS  Mild non stenotic irregularity both internal carotid arteries. Slight medial outpouching left cavernous ICA could be atheromatous aneurysmal dilatation. No visible intracranial berry aneurysm.  Moderate narrowing of the proximal anterior cerebral artery on the right unlikely to be clinically significant with a normal and dominant left A1 ACA. No proximal MCA stenosis. Intact circle of Willis as with bilateral patent posterior communicating arteries.  Mild to moderate irregularity of the proximal basilar artery. Question focal stenosis versus asymmetric sensation versus localized dissection. Right vertebral is the dominant contributor with left vertebral smaller but patent. No PCA stenosis. No cerebellar branch occlusion.  IMPRESSION: No acute intracranial abnormality. Mild to moderate age-related atrophy and chronic microvascular ischemic change.  Possible subcentimeter lesion within the right pituitary gland, likely incidental cyst.  Mild to moderate irregularity of the proximal basilar artery; question focal stenosis versus asymmetric fenestration versus localized dissection. Consider CTA head neck for further evaluation.   Electronically Signed   By: Davonna Belling M.D.   On: 06/30/2013 09:55    Medications: Scheduled Meds: . amLODipine  10 mg Oral Daily  . aspirin  81 mg Oral Daily  . atorvastatin  10 mg Oral Daily  . cholecalciferol  1,000 Units Oral Daily  . docusate sodium  100 mg Oral BID  . hydrALAZINE  37.5 mg Oral BID  . lisinopril  40 mg Oral Daily  .  multivitamin-lutein  1 capsule Oral Daily  . senna  1 tablet Oral BID  . sodium chloride  3 mL Intravenous Q12H  . sodium chloride  3 mL Intravenous Q12H      LOS: 1 day   RAI,RIPUDEEP M.D. [facility] 06/30/2013, 11:19 AM Pager: [phone]  If 7PM-7AM, please contact night-coverage [email or web address] Password [email or web address]

## 2013-06-30 NOTE — Progress Notes (Signed)
Neurology consult progress note  Reason for Consult: Dizziness  Subjective: - Patient feels better. Her dizziness has resolved. No fever or chills. No chest pain of SOB, No weakness in extremities. Had normal Bowel movement today.  - Carotid duplex showed Right - 40% to 59% ICA stenosis; Left - 80% to 99% ICA stenosis. Bilateral ECA stenosis left greater than right. Left subclavian stenosis. No significant change since study of 03/20/2013 performed at [facility]   Objective: Vital signs in last 24 hours: Filed Vitals:   06/29/13 1426 06/29/13 2120 06/30/13 0632 06/30/13 0955  BP: 154/45 144/60 138/53 144/64  Pulse: 78 67 63   Temp:  98.1 F (36.7 C) 98.7 F (37.1 C)   TempSrc:      Resp:  18 18   Height:      Weight:      SpO2:  99% 98%    Weight change:   Intake/Output Summary (Last 24 hours) at 06/30/13 1015 Last data filed at 06/30/13 0958  Gross per 24 hour  Intake    243 ml  Output      2 ml  Net    241 ml   General: Not in acute distress HEENT: PERRL, EOMI, no scleral icterus, No JVD. Has the bilateral carotid artery bruit Cardiac: S1/S2, RRR, 3/6 systolic murmur over aortic area. No gallops or rubs Pulm: Good air movement bilaterally. Clear to auscultation bilaterally. No rales, wheezing, rhonchi or rubs. Abd: Soft, nondistended, nontender, no rebound pain, no organomegaly, BS present Ext: No edema. 2+DP/PT pulse bilaterally Musculoskeletal: No joint deformities, erythema, or stiffness, ROM full Skin: No rashes.   Psych: Patient is not psychotic, no suicidal or hemocidal ideation.  Mental Status:   Alert, oriented, thought content appropriate. Speech fluent without evidence of aphasia. Able to follow 3 step commands without difficulty.    Cranial Nerves:      II:   Visual fields grossly intact.    III/IV/VI:   Extraocular movements intact. Pupils reactive bilaterally.    V/VII:   Smile symmetric. facial light touch sensation normal bilaterally.     VIII:   Grossly intact.    IX/X:   Normal gag.    XI:   Bilateral shoulder shrug normal.    XII:   Midline tongue extension normal.    Motor:   5/5 BL upper and lower extremities.    Sensory:   Pinprick and light touch intact throughout, bilaterally.   DTRs:   2+ and symmetric throughout    Cerebellar:   Normal  finger to nose.     Plantars:  Right: downgoing Left: downgoing   CV: pulses palpable throughout      Lab Results: Basic Metabolic Panel:  Recent Labs Lab 06/29/13 0500 06/30/13 0341  NA 135 136  K 4.1 4.5  CL 103 106  CO2 22 22  GLUCOSE 108* 94  BUN 17 12  CREATININE 0.93 0.89  CALCIUM 9.1 8.6   Liver Function Tests: No results found for this basename: AST, ALT, ALKPHOS, BILITOT, PROT, ALBUMIN,  in the last 168 hours No results found for this basename: LIPASE, AMYLASE,  in the last 168 hours No results found for this basename: AMMONIA,  in the last 168 hours CBC:  Recent Labs Lab 06/29/13 0500 06/30/13 0341  WBC 5.2 5.5  NEUTROABS 3.5  --   HGB 9.4* 8.6*  HCT 27.4* 25.4*  MCV 86.7 87.3  PLT 222 208   Urinalysis:  Recent Labs Lab 06/29/13  0512  COLORURINE YELLOW  LABSPEC 1.008  PHURINE 7.0  GLUCOSEU NEGATIVE  HGBUR NEGATIVE  BILIRUBINUR NEGATIVE  KETONESUR NEGATIVE  PROTEINUR 100*  UROBILINOGEN 0.2  NITRITE NEGATIVE  LEUKOCYTESUR NEGATIVE   Misc. Labs:  Micro Results: No results found for this or any previous visit (from the past 240 hour(s)). Studies/Results: Ct Head Wo Contrast  06/29/2013   CLINICAL DATA:  Dizziness, seeing black spots  EXAM: CT HEAD WITHOUT CONTRAST  TECHNIQUE: Contiguous axial images were obtained from the base of the skull through the vertex without intravenous contrast.  COMPARISON:  None.  FINDINGS: No skull fracture is noted. No intracranial hemorrhage, mass effect or midline shift. Paranasal sinuses and mastoid air cells are unremarkable. Bilateral parenchymal punctate calcification may represent sequela from  prior infection. Mild cerebral atrophy. No acute cortical infarction. No mass lesion is noted on this unenhanced scan.  IMPRESSION: No acute intracranial abnormality. Mild cerebral atrophy.   Electronically Signed   By: Natasha Mead M.D.   On: 06/29/2013 08:33   Mr [name] Head Wo Contrast  06/30/2013   CLINICAL DATA:  Dizziness. History of carotid artery stenosis. Stroke risk factors include hypertension, and hyperlipidemia.  EXAM: MRI HEAD WITHOUT CONTRAST  MRA HEAD WITHOUT CONTRAST  TECHNIQUE: Multiplanar, multiecho pulse sequences of the [name] and surrounding structures were obtained without intravenous contrast. Angiographic images of the head were obtained using MRA technique without contrast.  COMPARISON:  CT head 06/29/2013 is unremarkable. Carotid Dopplers from 04/05/2007 revealed bilateral carotid artery stenosis not clearly flow reducing.  FINDINGS: MRI HEAD FINDINGS  No evidence for acute infarction, hemorrhage, mass lesion, hydrocephalus, or extra-axial fluid. Mild to moderate age-related cerebral and cerebellar atrophy. Moderate small vessel type changes throughout the periventricular and subcortical white matter. No large vessel or significant lacunar infarct.  Flow voids are maintained throughout the carotid, basilar, and vertebral arteries. There are no areas of chronic hemorrhage. Possible subcentimeter T2 bright lesion right pituitary gland likely incidental cyst. No elevation of the pituitary contour or, cavernous sinus, or suprasellar mass lesion. Normal cerebellar tonsils and upper cervical region  Visualized calvarium, skull base, and upper cervical osseous structures unremarkable. Scalp and extracranial soft tissues, orbits, sinuses, and mastoids show no acute process.  MRA HEAD FINDINGS  Mild non stenotic irregularity both internal carotid arteries. Slight medial outpouching left cavernous ICA could be atheromatous aneurysmal dilatation. No visible intracranial berry aneurysm.  Moderate  narrowing of the proximal anterior cerebral artery on the right unlikely to be clinically significant with a normal and dominant left A1 ACA. No proximal MCA stenosis. Intact circle of Willis as with bilateral patent posterior communicating arteries.  Mild to moderate irregularity of the proximal basilar artery. Question focal stenosis versus asymmetric sensation versus localized dissection. Right vertebral is the dominant contributor with left vertebral smaller but patent. No PCA stenosis. No cerebellar branch occlusion.  IMPRESSION: No acute intracranial abnormality. Mild to moderate age-related atrophy and chronic microvascular ischemic change.  Possible subcentimeter lesion within the right pituitary gland, likely incidental cyst.  Mild to moderate irregularity of the proximal basilar artery; question focal stenosis versus asymmetric fenestration versus localized dissection. Consider CTA head neck for further evaluation.   Electronically Signed   By: Davonna Belling M.D.   On: 06/30/2013 09:55   Mr [name] Wo Contrast  06/30/2013   CLINICAL DATA:  Dizziness. History of carotid artery stenosis. Stroke risk factors include hypertension, and hyperlipidemia.  EXAM: MRI HEAD WITHOUT CONTRAST  MRA HEAD WITHOUT CONTRAST  TECHNIQUE: Multiplanar,  multiecho pulse sequences of the [name] and surrounding structures were obtained without intravenous contrast. Angiographic images of the head were obtained using MRA technique without contrast.  COMPARISON:  CT head 06/29/2013 is unremarkable. Carotid Dopplers from 04/05/2007 revealed bilateral carotid artery stenosis not clearly flow reducing.  FINDINGS: MRI HEAD FINDINGS  No evidence for acute infarction, hemorrhage, mass lesion, hydrocephalus, or extra-axial fluid. Mild to moderate age-related cerebral and cerebellar atrophy. Moderate small vessel type changes throughout the periventricular and subcortical white matter. No large vessel or significant lacunar infarct.  Flow  voids are maintained throughout the carotid, basilar, and vertebral arteries. There are no areas of chronic hemorrhage. Possible subcentimeter T2 bright lesion right pituitary gland likely incidental cyst. No elevation of the pituitary contour or, cavernous sinus, or suprasellar mass lesion. Normal cerebellar tonsils and upper cervical region  Visualized calvarium, skull base, and upper cervical osseous structures unremarkable. Scalp and extracranial soft tissues, orbits, sinuses, and mastoids show no acute process.  MRA HEAD FINDINGS  Mild non stenotic irregularity both internal carotid arteries. Slight medial outpouching left cavernous ICA could be atheromatous aneurysmal dilatation. No visible intracranial berry aneurysm.  Moderate narrowing of the proximal anterior cerebral artery on the right unlikely to be clinically significant with a normal and dominant left A1 ACA. No proximal MCA stenosis. Intact circle of Willis as with bilateral patent posterior communicating arteries.  Mild to moderate irregularity of the proximal basilar artery. Question focal stenosis versus asymmetric sensation versus localized dissection. Right vertebral is the dominant contributor with left vertebral smaller but patent. No PCA stenosis. No cerebellar branch occlusion.  IMPRESSION: No acute intracranial abnormality. Mild to moderate age-related atrophy and chronic microvascular ischemic change.  Possible subcentimeter lesion within the right pituitary gland, likely incidental cyst.  Mild to moderate irregularity of the proximal basilar artery; question focal stenosis versus asymmetric fenestration versus localized dissection. Consider CTA head neck for further evaluation.   Electronically Signed   By: Davonna Belling M.D.   On: 06/30/2013 09:55   Medications:  Scheduled Meds: . amLODipine  10 mg Oral Daily  . aspirin  81 mg Oral Daily  . atorvastatin  10 mg Oral Daily  . cholecalciferol  1,000 Units Oral Daily  . docusate  sodium  100 mg Oral BID  . hydrALAZINE  37.5 mg Oral BID  . lisinopril  40 mg Oral Daily  . multivitamin-lutein  1 capsule Oral Daily  . senna  1 tablet Oral BID  . sodium chloride  3 mL Intravenous Q12H  . sodium chloride  3 mL Intravenous Q12H   Continuous Infusions: . sodium chloride 75 mL/hr at 06/29/13 1547   PRN Meds:.sodium chloride, acetaminophen, acetaminophen, HYDROcodone-acetaminophen, morphine injection, ondansetron (ZOFRAN) IV, ondansetron, sodium chloride Assessment/Plan: Currently patient is hemodynamically stable. No acute new event over night. MRI/MRA was completed, showing mild to moderate irregularity of the proximal basilar artery, indicating possible focal stenosis versus asymmetric fenestration versus localized dissection.  Patient needs to have CTA head and neck for further evaluation. If patient turns to have basilar artery stenosis, will add ASA to plavix; ASA is evidence of dissection.     LOS: 1 day   Lorretta Harp, MD PGY3, Internal Medicine Teaching Service Pager: [phone]  Patient seen and examined together with internal medicine resident and I concur with the assessment and plan.  Wyatt Portela, MD

## 2013-07-01 ENCOUNTER — Inpatient Hospital Stay (HOSPITAL_COMMUNITY): Payer: Medicare Other

## 2013-07-01 LAB — T3: T3, Total: 87.6 ng/dl (ref 80.0–204.0)

## 2013-07-01 LAB — TSH: TSH: 1.845 u[IU]/mL (ref 0.350–4.500)

## 2013-07-01 LAB — T4, FREE: Free T4: 1.22 ng/dL (ref 0.80–1.80)

## 2013-07-01 MED ORDER — CLOPIDOGREL BISULFATE 75 MG PO TABS: 75.0000 mg | ORAL_TABLET | Freq: Every day | ORAL | Status: DC

## 2013-07-01 MED ORDER — MECLIZINE HCL 12.5 MG PO TABS: 12.5000 mg | ORAL_TABLET | Freq: Two times a day (BID) | ORAL | Status: DC | PRN

## 2013-07-01 MED ORDER — CLOPIDOGREL BISULFATE 75 MG PO TABS
75.0000 mg | ORAL_TABLET | Freq: Every day | ORAL | Status: DC
  Administered 2013-07-01: 75 mg via ORAL
  Filled 2013-07-01: qty 1

## 2013-07-01 MED ORDER — MECLIZINE HCL 12.5 MG PO TABS
12.5000 mg | ORAL_TABLET | Freq: Two times a day (BID) | ORAL | Status: DC
  Administered 2013-07-01: 12.5 mg via ORAL
  Filled 2013-07-01 (×2): qty 1

## 2013-07-01 MED ORDER — MECLIZINE HCL 12.5 MG PO TABS: 12.5000 mg | ORAL_TABLET | Freq: Two times a day (BID) | ORAL | Status: DC

## 2013-07-01 MED ORDER — UNABLE TO FIND: Status: DC

## 2013-07-01 MED ORDER — SENNOSIDES-DOCUSATE SODIUM 8.6-50 MG PO TABS: 1.0000 | ORAL_TABLET | Freq: Every evening | ORAL | Status: DC | PRN

## 2013-07-01 NOTE — Progress Notes (Signed)
NEURO HOSPITALIST PROGRESS NOTE   SUBJECTIVE:                                                                                                                        [name] stated that she is doing much better, although still with mild lightheadedness when she tries to get up from bed or move her head. This is not associated with nausea, vomiting, double vision, focal weakness or numbness, slurred speech, language or vision impairment. MRI-DWI reveals no acute stroke. CTA brain and neck showed no evidence of dissection. Severe stenosis right ICA .At the at the vertebrobasilar junction1 mm calcification is present, which appears to narrow the lumen by approximately 50%, corresponding to MRA abnormality. The remainder the basilar artery is normal in course and caliber.Severe narrowing of the origin left vertebral artery. On aspirin 81 mg daily.   OBJECTIVE:                                                                                                                           Vital signs in last 24 hours: Temp:  [97.1 F (36.2 C)-98.3 F (36.8 C)] 97.1 F (36.2 C) (11/08 0424) Pulse Rate:  [69-71] 71 (11/08 0424) Resp:  [16] 16 (11/08 0424) BP: (121-170)/(53-65) 143/65 mmHg (11/08 0424) SpO2:  [98 %-100 %] 98 % (11/08 0424)  Intake/Output from previous day: 11/07 0701 - 11/08 0700 In: 1366.8 [P.O.:480; I.V.:886.8] Out: -  Intake/Output this shift:   Nutritional status: Cardiac  Past Medical History  Diagnosis Date  . Hypertension     intolerant to Maxzide due to hyponatremia, intolerant to Clonidine due to slow HR/fatigue  . Hyperlipidemia   . Carotid artery occlusion   . Macular degeneration     In both eyes  . Arthritis   . Hiatal hernia   . Chronic kidney disease   . DVT (deep venous thrombosis)   . Anemia   . Irregular heartbeat   . Family history of anesthesia complication     daughter has nausea  . Coronary artery disease      Neurologic Exam:   Mental Status:  Alert, oriented, thought content appropriate. Speech fluent without evidence of aphasia. Able to follow 3 step commands without difficulty.  Cranial Nerves:    II:  Visual fields grossly intact.   III/IV/VI:  Extraocular movements intact. Pupils reactive bilaterally.   V/VII:  Smile symmetric. facial light touch sensation normal bilaterally.   VIII:  Grossly intact.   IX/X:  Normal gag.   XI:  Bilateral shoulder shrug normal.   XII:  Midline tongue extension normal.   Motor:  5/5 BL upper and lower extremities.   Sensory:  Pinprick and light touch intact throughout, bilaterally.   DTRs:  2+ and symmetric throughout   Cerebellar:  Normal finger to nose.    Plantars: Right: downgoing Left: downgoing  CV: pulses palpable throughout    Lab Results: No results found for this basename: cbc, bmp, coags, chol, tri, ldl, hga1c   Lipid Panel No results found for this basename: CHOL, TRIG, HDL, CHOLHDL, VLDL, LDLCALC,  in the last 72 hours  Studies/Results: Ct Angio Head W/cm &/or Wo Cm  06/30/2013   CLINICAL DATA:  Right neck pain with dizziness and vertigo, no headache.  EXAM: CT ANGIOGRAPHY HEAD AND NECK  TECHNIQUE: Multidetector CT imaging of the head and neck was performed using the standard protocol during bolus administration of intravenous contrast. Multiplanar CT image reconstructions including MIPs were obtained to evaluate the vascular anatomy. Carotid stenosis measurements (when applicable) are obtained utilizing NASCET criteria, using the distal internal carotid diameter as the denominator.  CONTRAST:  50mL OMNIPAQUE IOHEXOL 350 MG/ML SOLN  COMPARISON:  MRI and am are day of the head June 30, 2013 at 8:01 a.m., CT of the head June 29, 2013, carotid ultrasound April 05, 2007  FINDINGS: CTA HEAD FINDINGS  Non contrast imaging demonstrates normal ventricles and sulci for age. Multiple punctate calcifications at the gray-white matter  junction. , without corresponding FLAIR/T2 abnormality, may reflect remote infectious process, less likely treated metastasis. No intraparenchymal hemorrhage, mass effect or midline shift. Patchy white matter hypodensities suggest sequelae of chronic small vessel ischemic disease, better characterized on MRI of the brain from same day, reported separately. No midline shift or mass effect. No acute large vascular territory infarct.  Basal cisterns are patent. Dense calcific atherosclerosis of the carotid siphons.  Anterior circulation: Bilateral features, cavernous carotid arteries are patent , there appears to be at least mild to moderate narrowing of the bilateral cavernous carotid arteries due to calcific atherosclerosis. Less than 50% narrowing of the right supra clinoid internal carotid artery. Left A1 segment is dominant, with normal appearance the anterior middle cerebral artery; the mild narrowing of the right anterior cerebral artery. Suggests on prior examination is not apparent on this study. Bilateral middle cerebral arteries are patent, including the more distal segments.  Posterior circulation: Right vertebral artery is dominant. Moderate calcific atherosclerosis of the left extra foraminal, extradural vertebral artery without apparent hemodynamically significant stenosis. At the at the vertebrobasilar junction. A 1 mm calcification is present, which appears to narrow the lumen by approximately 50%, corresponding to MRA abnormality. The remainder the basilar artery is normal in course and caliber, no dissection. Right greater than left posterior communicating arteries are present. Normal appearance of posterior cerebral arteries.  No aneurysm, large vessel occlusion or is suspicious luminal irregularity.  Review of the MIP images confirms the above findings.  CTA NECK FINDINGS  Severe calcific atherosclerosis of the aortic arch, though mild motion degrades evaluation of the main branch vessels. However,  there appears to be severe narrowing of the brachiocephalic artery, with the origin of the right common carotid artery is patent though,  narrowed by least 50-75%, coronal 114/184. The origin of left common carotid artery appears severely narrowed, greater than 75%, coronal 116/184. Severe narrowing of the distal left common carotid artery, the lumen is narrowed to approximately 2 mm, from 7 mm.  Marked calcific atherosclerosis of the right greater than left carotid bifurcations, with severe narrowing, the origin of the right internal carotid artery is narrowed to approximately 1 mm, and measures 5 mm distally (approximately 80% stenosis). The origin of the left internal carotid artery is narrowed to 1.8 mm, the more distal left internal carotid artery is 5.3 mm (approximately 66% stenosis).  The distal cervical internal carotid artery segments are widely patent.  Severe narrowing of the origin of the left, moderate to severe in narrowing of the origin the right vertebral arteries. Enhancement of left vertebral artery, to the level the proximal intra foraminal segment where the left vertebral artery is no longer visualized. Within the more distal left vertebral artery foraminal segment, image 143/342 is occlusive calcification, above this level there is reconstitution left vertebral artery, by apparent muscular branches. Subsequent severe narrowing of the left vertebral artery, from the transition intraforaminal to extra foraminal segment.  The right vertebral artery appears widely patent throughout all segments.  No pseudoaneurysm nor dissection.  Complex cystic and solid appearing 19 x 20 mm left thyroid nodule. Punctate calcifications of the right thyroid. Included view of the lung apices demonstrate severe centrilobular emphysema.  Aerodigestive tract is nonsuspicious. Normal appearance of major salivary glands. Degenerative changes cervical spine with grade 1 C4-5 anterolisthesis, on spondylotic basis. Moderate  to severe right C3-4 neural foraminal narrowing, severe right C5-6 neural foraminal narrowing.  Review of the MIP images confirms the above findings.  IMPRESSION: CTA head: Focal calcification though vertebrobasilar junction narrows the lumen by approximately 50%, no evidence of dissection. Generalized intracranial atherosclerosis, resulting in apparent less than 50% narrowing of the right supra clinoid internal carotid artery.  CTA neck: Severe stenosis of the right internal carotid artery by NASCET criteria (estimated at 50- 69% on carotid ultrasound April 05, 2007) and moderate stenosis of the left internal carotid artery origin by NASCET criteria.  Severe calcific atherosclerosis of the aortic arch, resulting in appearance severe stenosis of the origin of the brachiocephalic artery and left common carotid artery. In addition, severely narrowed mid left common carotid artery due to calcific atherosclerosis.  Severe narrowing of the origin left vertebral artery, with exclusion of the proximal intraforaminal left vertebral artery, due to occlusive calcification, with reconstitution distally due to apparent muscular branches. Subsequent severe narrowing of the left vertebral artery from the transition of the intra foraminal extra foraminal segment.  19 x 20 mm complex cystic and solid left thyroid nodule would be better characterized on dedicated thyroid sonogram as clinically indicated.   Electronically Signed   By: Awilda Metro   On: 06/30/2013 17:42   Ct Head Wo Contrast  06/29/2013   CLINICAL DATA:  Dizziness, seeing black spots  EXAM: CT HEAD WITHOUT CONTRAST  TECHNIQUE: Contiguous axial images were obtained from the base of the skull through the vertex without intravenous contrast.  COMPARISON:  None.  FINDINGS: No skull fracture is noted. No intracranial hemorrhage, mass effect or midline shift. Paranasal sinuses and mastoid air cells are unremarkable. Bilateral parenchymal punctate calcification may  represent sequela from prior infection. Mild cerebral atrophy. No acute cortical infarction. No mass lesion is noted on this unenhanced scan.  IMPRESSION: No acute intracranial abnormality. Mild cerebral atrophy.   Electronically  Signed   By: Natasha Mead M.D.   On: 06/29/2013 08:33   Ct Angio Neck W/cm &/or Wo/cm  06/30/2013   CLINICAL DATA:  Right neck pain with dizziness and vertigo, no headache.  EXAM: CT ANGIOGRAPHY HEAD AND NECK  TECHNIQUE: Multidetector CT imaging of the head and neck was performed using the standard protocol during bolus administration of intravenous contrast. Multiplanar CT image reconstructions including MIPs were obtained to evaluate the vascular anatomy. Carotid stenosis measurements (when applicable) are obtained utilizing NASCET criteria, using the distal internal carotid diameter as the denominator.  CONTRAST:  50mL OMNIPAQUE IOHEXOL 350 MG/ML SOLN  COMPARISON:  MRI and am are day of the head June 30, 2013 at 8:01 a.m., CT of the head June 29, 2013, carotid ultrasound April 05, 2007  FINDINGS: CTA HEAD FINDINGS  Non contrast imaging demonstrates normal ventricles and sulci for age. Multiple punctate calcifications at the gray-white matter junction. , without corresponding FLAIR/T2 abnormality, may reflect remote infectious process, less likely treated metastasis. No intraparenchymal hemorrhage, mass effect or midline shift. Patchy white matter hypodensities suggest sequelae of chronic small vessel ischemic disease, better characterized on MRI of the brain from same day, reported separately. No midline shift or mass effect. No acute large vascular territory infarct.  Basal cisterns are patent. Dense calcific atherosclerosis of the carotid siphons.  Anterior circulation: Bilateral features, cavernous carotid arteries are patent , there appears to be at least mild to moderate narrowing of the bilateral cavernous carotid arteries due to calcific atherosclerosis. Less than 50%  narrowing of the right supra clinoid internal carotid artery. Left A1 segment is dominant, with normal appearance the anterior middle cerebral artery; the mild narrowing of the right anterior cerebral artery. Suggests on prior examination is not apparent on this study. Bilateral middle cerebral arteries are patent, including the more distal segments.  Posterior circulation: Right vertebral artery is dominant. Moderate calcific atherosclerosis of the left extra foraminal, extradural vertebral artery without apparent hemodynamically significant stenosis. At the at the vertebrobasilar junction. A 1 mm calcification is present, which appears to narrow the lumen by approximately 50%, corresponding to MRA abnormality. The remainder the basilar artery is normal in course and caliber, no dissection. Right greater than left posterior communicating arteries are present. Normal appearance of posterior cerebral arteries.  No aneurysm, large vessel occlusion or is suspicious luminal irregularity.  Review of the MIP images confirms the above findings.  CTA NECK FINDINGS  Severe calcific atherosclerosis of the aortic arch, though mild motion degrades evaluation of the main branch vessels. However, there appears to be severe narrowing of the brachiocephalic artery, with the origin of the right common carotid artery is patent though, narrowed by least 50-75%, coronal 114/184. The origin of left common carotid artery appears severely narrowed, greater than 75%, coronal 116/184. Severe narrowing of the distal left common carotid artery, the lumen is narrowed to approximately 2 mm, from 7 mm.  Marked calcific atherosclerosis of the right greater than left carotid bifurcations, with severe narrowing, the origin of the right internal carotid artery is narrowed to approximately 1 mm, and measures 5 mm distally (approximately 80% stenosis). The origin of the left internal carotid artery is narrowed to 1.8 mm, the more distal left internal  carotid artery is 5.3 mm (approximately 66% stenosis).  The distal cervical internal carotid artery segments are widely patent.  Severe narrowing of the origin of the left, moderate to severe in narrowing of the origin the right vertebral arteries. Enhancement of left  vertebral artery, to the level the proximal intra foraminal segment where the left vertebral artery is no longer visualized. Within the more distal left vertebral artery foraminal segment, image 143/342 is occlusive calcification, above this level there is reconstitution left vertebral artery, by apparent muscular branches. Subsequent severe narrowing of the left vertebral artery, from the transition intraforaminal to extra foraminal segment.  The right vertebral artery appears widely patent throughout all segments.  No pseudoaneurysm nor dissection.  Complex cystic and solid appearing 19 x 20 mm left thyroid nodule. Punctate calcifications of the right thyroid. Included view of the lung apices demonstrate severe centrilobular emphysema.  Aerodigestive tract is nonsuspicious. Normal appearance of major salivary glands. Degenerative changes cervical spine with grade 1 C4-5 anterolisthesis, on spondylotic basis. Moderate to severe right C3-4 neural foraminal narrowing, severe right C5-6 neural foraminal narrowing.  Review of the MIP images confirms the above findings.  IMPRESSION: CTA head: Focal calcification though vertebrobasilar junction narrows the lumen by approximately 50%, no evidence of dissection. Generalized intracranial atherosclerosis, resulting in apparent less than 50% narrowing of the right supra clinoid internal carotid artery.  CTA neck: Severe stenosis of the right internal carotid artery by NASCET criteria (estimated at 50- 69% on carotid ultrasound April 05, 2007) and moderate stenosis of the left internal carotid artery origin by NASCET criteria.  Severe calcific atherosclerosis of the aortic arch, resulting in appearance severe  stenosis of the origin of the brachiocephalic artery and left common carotid artery. In addition, severely narrowed mid left common carotid artery due to calcific atherosclerosis.  Severe narrowing of the origin left vertebral artery, with exclusion of the proximal intraforaminal left vertebral artery, due to occlusive calcification, with reconstitution distally due to apparent muscular branches. Subsequent severe narrowing of the left vertebral artery from the transition of the intra foraminal extra foraminal segment.  19 x 20 mm complex cystic and solid left thyroid nodule would be better characterized on dedicated thyroid sonogram as clinically indicated.   Electronically Signed   By: Awilda Metro   On: 06/30/2013 17:42   Ct Cervical Spine Wo Contrast  06/30/2013   CLINICAL DATA:  Right-sided neck pain and dizziness.  EXAM: CT CERVICAL SPINE WITHOUT CONTRAST  TECHNIQUE: Multidetector CT imaging of the cervical spine was performed without intravenous contrast. Multiplanar CT image reconstructions were also generated.  COMPARISON:  None.  FINDINGS: The sagittal reformatted images from the CT angiogram of the head neck demonstrates moderate degenerative cervical spondylosis with multilevel disc disease and facet disease. There are fused facets and partial interbody fusion C3-4. No acute fractures identified. No destructive bony changes or worrisome bone lesion. The skullbase C1 and C1-2 articulations are maintained. There are moderate degenerative changes at C1-2. The dens is intact.  No large disc protrusions or significant spinal stenosis. The spinal canal is generous. There is mild right foraminal stenosis at C5-6 due to uncinate spurring. Mild bilateral foraminal stenosis at C6-7 due to uncinate spurring.  The lung apices are clear. Emphysematous changes are noted. The thyroid gland is enlarged and there is a mixed solid and cystic lesion in the left thyroid lobe. Recommend ultrasound correlation and  potential biopsy.  IMPRESSION: Degenerative cervical spondylosis with multilevel disc disease and facet disease.  No acute bony findings.  Mixed cystic and solid left thyroid lesion. Recommend ultrasound correlation and possible biopsy.   Electronically Signed   By: Loralie Champagne M.D.   On: 06/30/2013 18:15   Mr Maxine Glenn Head Wo Contrast  06/30/2013  CLINICAL DATA:  Dizziness. History of carotid artery stenosis. Stroke risk factors include hypertension, and hyperlipidemia.  EXAM: MRI HEAD WITHOUT CONTRAST  MRA HEAD WITHOUT CONTRAST  TECHNIQUE: Multiplanar, multiecho pulse sequences of the brain and surrounding structures were obtained without intravenous contrast. Angiographic images of the head were obtained using MRA technique without contrast.  COMPARISON:  CT head 06/29/2013 is unremarkable. Carotid Dopplers from 04/05/2007 revealed bilateral carotid artery stenosis not clearly flow reducing.  FINDINGS: MRI HEAD FINDINGS  No evidence for acute infarction, hemorrhage, mass lesion, hydrocephalus, or extra-axial fluid. Mild to moderate age-related cerebral and cerebellar atrophy. Moderate small vessel type changes throughout the periventricular and subcortical white matter. No large vessel or significant lacunar infarct.  Flow voids are maintained throughout the carotid, basilar, and vertebral arteries. There are no areas of chronic hemorrhage. Possible subcentimeter T2 bright lesion right pituitary gland likely incidental cyst. No elevation of the pituitary contour or, cavernous sinus, or suprasellar mass lesion. Normal cerebellar tonsils and upper cervical region  Visualized calvarium, skull base, and upper cervical osseous structures unremarkable. Scalp and extracranial soft tissues, orbits, sinuses, and mastoids show no acute process.  MRA HEAD FINDINGS  Mild non stenotic irregularity both internal carotid arteries. Slight medial outpouching left cavernous ICA could be atheromatous aneurysmal dilatation. No  visible intracranial berry aneurysm.  Moderate narrowing of the proximal anterior cerebral artery on the right unlikely to be clinically significant with a normal and dominant left A1 ACA. No proximal MCA stenosis. Intact circle of Willis as with bilateral patent posterior communicating arteries.  Mild to moderate irregularity of the proximal basilar artery. Question focal stenosis versus asymmetric sensation versus localized dissection. Right vertebral is the dominant contributor with left vertebral smaller but patent. No PCA stenosis. No cerebellar branch occlusion.  IMPRESSION: No acute intracranial abnormality. Mild to moderate age-related atrophy and chronic microvascular ischemic change.  Possible subcentimeter lesion within the right pituitary gland, likely incidental cyst.  Mild to moderate irregularity of the proximal basilar artery; question focal stenosis versus asymmetric fenestration versus localized dissection. Consider CTA head neck for further evaluation.   Electronically Signed   By: Davonna Belling M.D.   On: 06/30/2013 09:55   Mr Brain Wo Contrast  06/30/2013   CLINICAL DATA:  Dizziness. History of carotid artery stenosis. Stroke risk factors include hypertension, and hyperlipidemia.  EXAM: MRI HEAD WITHOUT CONTRAST  MRA HEAD WITHOUT CONTRAST  TECHNIQUE: Multiplanar, multiecho pulse sequences of the brain and surrounding structures were obtained without intravenous contrast. Angiographic images of the head were obtained using MRA technique without contrast.  COMPARISON:  CT head 06/29/2013 is unremarkable. Carotid Dopplers from 04/05/2007 revealed bilateral carotid artery stenosis not clearly flow reducing.  FINDINGS: MRI HEAD FINDINGS  No evidence for acute infarction, hemorrhage, mass lesion, hydrocephalus, or extra-axial fluid. Mild to moderate age-related cerebral and cerebellar atrophy. Moderate small vessel type changes throughout the periventricular and subcortical white matter. No large  vessel or significant lacunar infarct.  Flow voids are maintained throughout the carotid, basilar, and vertebral arteries. There are no areas of chronic hemorrhage. Possible subcentimeter T2 bright lesion right pituitary gland likely incidental cyst. No elevation of the pituitary contour or, cavernous sinus, or suprasellar mass lesion. Normal cerebellar tonsils and upper cervical region  Visualized calvarium, skull base, and upper cervical osseous structures unremarkable. Scalp and extracranial soft tissues, orbits, sinuses, and mastoids show no acute process.  MRA HEAD FINDINGS  Mild non stenotic irregularity both internal carotid arteries. Slight medial outpouching left cavernous ICA  could be atheromatous aneurysmal dilatation. No visible intracranial berry aneurysm.  Moderate narrowing of the proximal anterior cerebral artery on the right unlikely to be clinically significant with a normal and dominant left A1 ACA. No proximal MCA stenosis. Intact circle of Willis as with bilateral patent posterior communicating arteries.  Mild to moderate irregularity of the proximal basilar artery. Question focal stenosis versus asymmetric sensation versus localized dissection. Right vertebral is the dominant contributor with left vertebral smaller but patent. No PCA stenosis. No cerebellar branch occlusion.  IMPRESSION: No acute intracranial abnormality. Mild to moderate age-related atrophy and chronic microvascular ischemic change.  Possible subcentimeter lesion within the right pituitary gland, likely incidental cyst.  Mild to moderate irregularity of the proximal basilar artery; question focal stenosis versus asymmetric fenestration versus localized dissection. Consider CTA head neck for further evaluation.   Electronically Signed   By: Davonna Belling M.D.   On: 06/30/2013 09:55    MEDICATIONS                                                                                                                       I have  reviewed the patient's current medications.  ASSESSMENT/PLAN:                                                                                                            Clinically better. Severe right ICA stenosis by her syndrome seems to be unrelated to the right anterior circulation. There is intracranial stenosis that is not critical at the VB system but overall her symptoms appear to be more consistent with posterior circulation involvement. Will suggest adding plavix to aspirin. TCD with microemboli monitoring could provide further information regarding the status of the anterior and posterior circulation. Please, call neurology with any questions or concerns.   Wyatt Portela, MD [facility] [id number]  07/01/2013, 7:27 AM

## 2013-07-01 NOTE — Discharge Summary (Signed)
Physician Discharge Summary  Patient ID: STARLETTA HOUCHIN MRN: [id number] DOB/AGE: 03-06-1931 79 y.o.  Admit date: 06/29/2013 Discharge date: 07/01/2013  Primary Care Physician:  Bennie Pierini, FNP  Discharge Diagnoses:     Vertebrobasilar insufficiency  . CAROTID STENOSIS . HYPERTENSION . DYSLIPIDEMIA . Murmur . Dizziness  Consults:  neurology, Dr. Cyril Mourning                  Vascular surgery , Dr Myra Gianotti   Recommendations for Outpatient Follow-up:  1). transcranial Dopplers need to be scheduled outpatient- patient and family recommended to make an appointment with Dr. Pearlean Brownie 2) Out patient therapy preferably with neuro rehabilitation center 3) please follow thyroid panel   Allergies:   Allergies  Allergen Reactions  . Penicillins Rash    Oral swelling     Discharge Medications:   Medication List         amLODipine 10 MG tablet  Commonly known as:  NORVASC  Take 10 mg by mouth daily.     aspirin 81 MG tablet  Take 81 mg by mouth daily.     atorvastatin 10 MG tablet  Commonly known as:  LIPITOR  Take 1 tablet (10 mg total) by mouth daily.     CALCITRATE PO  Take 1 tablet by mouth daily.     cholecalciferol 1000 UNITS tablet  Commonly known as:  VITAMIN D  Take 1,000 Units by mouth daily.     clopidogrel 75 MG tablet  Commonly known as:  PLAVIX  Take 1 tablet (75 mg total) by mouth daily with breakfast.     hydrALAZINE 25 MG tablet  Commonly known as:  APRESOLINE  Take 1.5 tablets (37.5 mg total) by mouth 2 (two) times daily.     ibuprofen 200 MG tablet  Commonly known as:  ADVIL,MOTRIN  Take 200 mg by mouth every 6 (six) hours as needed for mild pain.     lisinopril 40 MG tablet  Commonly known as:  PRINIVIL,ZESTRIL  Take 1 tablet (40 mg total) by mouth daily.     meclizine 12.5 MG tablet  Commonly known as:  ANTIVERT  Take 1 tablet (12.5 mg total) by mouth 2 (two) times daily as needed for dizziness (also available over the counter).      PRESERVISION AREDS Caps  Take 1 capsule by mouth daily.     senna-docusate 8.6-50 MG per tablet  Commonly known as:  Senokot-S  Take 1-2 tablets by mouth at bedtime as needed for mild constipation.     UNABLE TO FIND  - Outpatient physical therapy, preferably Neuro- rehabilitation center  -   -   - Diagnoses: Vertebrobasilar insufficiency         Brief H and P: For complete details please refer to admission H and P, but in brief [name] is a 79 y.o. female  History of carotid artery stenosis, hypertension, hyperlipidemia, presents emergency department for complaints of dizziness. Patient stated that earlier in the morning of admission she was going up to the bathroom and she started looking to her right side she became dizzy and thought her vision was going. Patient covered her head with a blanket until her symptoms resolved. Patient then was able to go to the bathroom however while in the bathroom she had a similar episode. She then fell however does not remember hitting her head. Patient stated that she does have macular degeneration and does receive injections for this. She does have a known history of  carotid disease. She has been following up with Dr. Myra Gianotti.  Hospital Course:   Dizziness: Most likely due to posterior circulation deficits from vertebrobasilar insufficiency and possibly vertigo. patient was admitted, neurology and vascular surgery were consulted. MRI of the brain showed no acute infarct or hemorrhage, MRA showed mild to moderate irregularity of the proximal basilar artery, question focal stenosis versus localized dissection versus asymmetric fenestration.  CT angiogram of the head and neck showed focal calcification through vertebrobasilar junction 50%, no dissection, severe stenosis of the right internal carotid artery, moderate stenosis of left internal carotid artery. 19x20 MM complex cystic and solid left thyroid nodule. Neurology recommended TCD with  microemboli monitoring outpatient to provide further information regarding the status of anterior and posterior circulation, patient will call on Monday to schedule an appointment with Dr. Pennie Rushing for the procedure. Per neurology recommendations, patient was placed on Plavix and aspirin Per Dr Myra Gianotti, Vascular surgery, no significant progression from exams this year, her complaints are not consistent with TIA or stroke in distribution of right carotid artery, symptoms appears to be more posterior circulation and continue medical treatment.  She has followup appointment with  Dr Myra Gianotti. Added meclizine, patient reported that she had vestibular exercises with physical therapy which did help her dizziness, outpatient PT to be set up.  Thyroid nodule : Patient has a known history of thyroid nodules, per patient and her daughter she had biopsy and aspiration in 2004 and it was cystic. Per the request, thyroid ultrasound was done which showed multiple bilateral thyroid nodules, 2.9 cm mixed cystic/nodule in the right mid gland, explained the results to the patient's daughter., she will follow up with primary care physician. Thyroid panel sent, will need to be followed up by PCP.  DYSLIPIDEMIA  - Continue Lipitor   HYPERTENSION: Stable   CAROTID STENOSIS: As #1   Murmur- 2-D echo 06/30/2013 showed EF of 60-65%, grade 1 diastolic dysfunction , no aortic stenosis or regurgitation   Day of Discharge BP 133/54  Pulse 71  Temp(Src) 97.1 F (36.2 C) (Oral)  Resp 16  Ht 5\' 7"  (1.702 m)  Wt 57.153 kg (126 lb)  BMI 19.73 kg/m2  SpO2 98%  Physical Exam:  General: A x O x3  HEENT: anicteric sclera, PERLA, EOMI, + carotid bruit bilaterally  CVS: S1-S2 clear, 3/6 SEM  Chest: CTAB  Abdomen: soft NT, ND, NBS  Ext: no c/c/e  Neuro: Cranial nerves II-XII intact, no fnd   The results of significant diagnostics from this hospitalization (including imaging, microbiology, ancillary and laboratory) are listed  below for reference.    LAB RESULTS: Basic Metabolic Panel:  Recent Labs Lab 06/29/13 0500 06/30/13 0341  NA 135 136  K 4.1 4.5  CL 103 106  CO2 22 22  GLUCOSE 108* 94  BUN 17 12  CREATININE 0.93 0.89  CALCIUM 9.1 8.6   Liver Function Tests: No results found for this basename: AST, ALT, ALKPHOS, BILITOT, PROT, ALBUMIN,  in the last 168 hours No results found for this basename: LIPASE, AMYLASE,  in the last 168 hours No results found for this basename: AMMONIA,  in the last 168 hours CBC:  Recent Labs Lab 06/29/13 0500 06/30/13 0341  WBC 5.2 5.5  NEUTROABS 3.5  --   HGB 9.4* 8.6*  HCT 27.4* 25.4*  MCV 86.7 87.3  PLT 222 208   Cardiac Enzymes: No results found for this basename: CKTOTAL, CKMB, CKMBINDEX, TROPONINI,  in the last 168 hours BNP: No components found  with this basename: POCBNP,  CBG: No results found for this basename: GLUCAP,  in the last 168 hours  Significant Diagnostic Studies:  Ct Angio Head W/cm &/or Wo Cm  06/30/2013   CLINICAL DATA:  Right neck pain with dizziness and vertigo, no headache.  EXAM: CT ANGIOGRAPHY HEAD AND NECK  TECHNIQUE: Multidetector CT imaging of the head and neck was performed using the standard protocol during bolus administration of intravenous contrast. Multiplanar CT image reconstructions including MIPs were obtained to evaluate the vascular anatomy. Carotid stenosis measurements (when applicable) are obtained utilizing NASCET criteria, using the distal internal carotid diameter as the denominator.  CONTRAST:  50mL OMNIPAQUE IOHEXOL 350 MG/ML SOLN  COMPARISON:  MRI and am are day of the head June 30, 2013 at 8:01 a.m., CT of the head June 29, 2013, carotid ultrasound April 05, 2007  FINDINGS: CTA HEAD FINDINGS  Non contrast imaging demonstrates normal ventricles and sulci for age. Multiple punctate calcifications at the gray-white matter junction. , without corresponding FLAIR/T2 abnormality, may reflect remote infectious  process, less likely treated metastasis. No intraparenchymal hemorrhage, mass effect or midline shift. Patchy white matter hypodensities suggest sequelae of chronic small vessel ischemic disease, better characterized on MRI of the brain from same day, reported separately. No midline shift or mass effect. No acute large vascular territory infarct.  Basal cisterns are patent. Dense calcific atherosclerosis of the carotid siphons.  Anterior circulation: Bilateral features, cavernous carotid arteries are patent , there appears to be at least mild to moderate narrowing of the bilateral cavernous carotid arteries due to calcific atherosclerosis. Less than 50% narrowing of the right supra clinoid internal carotid artery. Left A1 segment is dominant, with normal appearance the anterior middle cerebral artery; the mild narrowing of the right anterior cerebral artery. Suggests on prior examination is not apparent on this study. Bilateral middle cerebral arteries are patent, including the more distal segments.  Posterior circulation: Right vertebral artery is dominant. Moderate calcific atherosclerosis of the left extra foraminal, extradural vertebral artery without apparent hemodynamically significant stenosis. At the at the vertebrobasilar junction. A 1 mm calcification is present, which appears to narrow the lumen by approximately 50%, corresponding to MRA abnormality. The remainder the basilar artery is normal in course and caliber, no dissection. Right greater than left posterior communicating arteries are present. Normal appearance of posterior cerebral arteries.  No aneurysm, large vessel occlusion or is suspicious luminal irregularity.  Review of the MIP images confirms the above findings.  CTA NECK FINDINGS  Severe calcific atherosclerosis of the aortic arch, though mild motion degrades evaluation of the main branch vessels. However, there appears to be severe narrowing of the brachiocephalic artery, with the origin  of the right common carotid artery is patent though, narrowed by least 50-75%, coronal 114/184. The origin of left common carotid artery appears severely narrowed, greater than 75%, coronal 116/184. Severe narrowing of the distal left common carotid artery, the lumen is narrowed to approximately 2 mm, from 7 mm.  Marked calcific atherosclerosis of the right greater than left carotid bifurcations, with severe narrowing, the origin of the right internal carotid artery is narrowed to approximately 1 mm, and measures 5 mm distally (approximately 80% stenosis). The origin of the left internal carotid artery is narrowed to 1.8 mm, the more distal left internal carotid artery is 5.3 mm (approximately 66% stenosis).  The distal cervical internal carotid artery segments are widely patent.  Severe narrowing of the origin of the left, moderate to severe in narrowing  of the origin the right vertebral arteries. Enhancement of left vertebral artery, to the level the proximal intra foraminal segment where the left vertebral artery is no longer visualized. Within the more distal left vertebral artery foraminal segment, image 143/342 is occlusive calcification, above this level there is reconstitution left vertebral artery, by apparent muscular branches. Subsequent severe narrowing of the left vertebral artery, from the transition intraforaminal to extra foraminal segment.  The right vertebral artery appears widely patent throughout all segments.  No pseudoaneurysm nor dissection.  Complex cystic and solid appearing 19 x 20 mm left thyroid nodule. Punctate calcifications of the right thyroid. Included view of the lung apices demonstrate severe centrilobular emphysema.  Aerodigestive tract is nonsuspicious. Normal appearance of major salivary glands. Degenerative changes cervical spine with grade 1 C4-5 anterolisthesis, on spondylotic basis. Moderate to severe right C3-4 neural foraminal narrowing, severe right C5-6 neural foraminal  narrowing.  Review of the MIP images confirms the above findings.  IMPRESSION: CTA head: Focal calcification though vertebrobasilar junction narrows the lumen by approximately 50%, no evidence of dissection. Generalized intracranial atherosclerosis, resulting in apparent less than 50% narrowing of the right supra clinoid internal carotid artery.  CTA neck: Severe stenosis of the right internal carotid artery by NASCET criteria (estimated at 50- 69% on carotid ultrasound April 05, 2007) and moderate stenosis of the left internal carotid artery origin by NASCET criteria.  Severe calcific atherosclerosis of the aortic arch, resulting in appearance severe stenosis of the origin of the brachiocephalic artery and left common carotid artery. In addition, severely narrowed mid left common carotid artery due to calcific atherosclerosis.  Severe narrowing of the origin left vertebral artery, with exclusion of the proximal intraforaminal left vertebral artery, due to occlusive calcification, with reconstitution distally due to apparent muscular branches. Subsequent severe narrowing of the left vertebral artery from the transition of the intra foraminal extra foraminal segment.  19 x 20 mm complex cystic and solid left thyroid nodule would be better characterized on dedicated thyroid sonogram as clinically indicated.   Electronically Signed   By: Awilda Metro   On: 06/30/2013 17:42   Ct Head Wo Contrast  06/29/2013   CLINICAL DATA:  Dizziness, seeing black spots  EXAM: CT HEAD WITHOUT CONTRAST  TECHNIQUE: Contiguous axial images were obtained from the base of the skull through the vertex without intravenous contrast.  COMPARISON:  None.  FINDINGS: No skull fracture is noted. No intracranial hemorrhage, mass effect or midline shift. Paranasal sinuses and mastoid air cells are unremarkable. Bilateral parenchymal punctate calcification may represent sequela from prior infection. Mild cerebral atrophy. No acute cortical  infarction. No mass lesion is noted on this unenhanced scan.  IMPRESSION: No acute intracranial abnormality. Mild cerebral atrophy.   Electronically Signed   By: Natasha Mead M.D.   On: 06/29/2013 08:33   Ct Angio Neck W/cm &/or Wo/cm  06/30/2013   CLINICAL DATA:  Right neck pain with dizziness and vertigo, no headache.  EXAM: CT ANGIOGRAPHY HEAD AND NECK  TECHNIQUE: Multidetector CT imaging of the head and neck was performed using the standard protocol during bolus administration of intravenous contrast. Multiplanar CT image reconstructions including MIPs were obtained to evaluate the vascular anatomy. Carotid stenosis measurements (when applicable) are obtained utilizing NASCET criteria, using the distal internal carotid diameter as the denominator.  CONTRAST:  50mL OMNIPAQUE IOHEXOL 350 MG/ML SOLN  COMPARISON:  MRI and am are day of the head June 30, 2013 at 8:01 a.m., CT of the head June 29, 2013, carotid ultrasound April 05, 2007  FINDINGS: CTA HEAD FINDINGS  Non contrast imaging demonstrates normal ventricles and sulci for age. Multiple punctate calcifications at the gray-white matter junction. , without corresponding FLAIR/T2 abnormality, may reflect remote infectious process, less likely treated metastasis. No intraparenchymal hemorrhage, mass effect or midline shift. Patchy white matter hypodensities suggest sequelae of chronic small vessel ischemic disease, better characterized on MRI of the brain from same day, reported separately. No midline shift or mass effect. No acute large vascular territory infarct.  Basal cisterns are patent. Dense calcific atherosclerosis of the carotid siphons.  Anterior circulation: Bilateral features, cavernous carotid arteries are patent , there appears to be at least mild to moderate narrowing of the bilateral cavernous carotid arteries due to calcific atherosclerosis. Less than 50% narrowing of the right supra clinoid internal carotid artery. Left A1 segment is  dominant, with normal appearance the anterior middle cerebral artery; the mild narrowing of the right anterior cerebral artery. Suggests on prior examination is not apparent on this study. Bilateral middle cerebral arteries are patent, including the more distal segments.  Posterior circulation: Right vertebral artery is dominant. Moderate calcific atherosclerosis of the left extra foraminal, extradural vertebral artery without apparent hemodynamically significant stenosis. At the at the vertebrobasilar junction. A 1 mm calcification is present, which appears to narrow the lumen by approximately 50%, corresponding to MRA abnormality. The remainder the basilar artery is normal in course and caliber, no dissection. Right greater than left posterior communicating arteries are present. Normal appearance of posterior cerebral arteries.  No aneurysm, large vessel occlusion or is suspicious luminal irregularity.  Review of the MIP images confirms the above findings.  CTA NECK FINDINGS  Severe calcific atherosclerosis of the aortic arch, though mild motion degrades evaluation of the main branch vessels. However, there appears to be severe narrowing of the brachiocephalic artery, with the origin of the right common carotid artery is patent though, narrowed by least 50-75%, coronal 114/184. The origin of left common carotid artery appears severely narrowed, greater than 75%, coronal 116/184. Severe narrowing of the distal left common carotid artery, the lumen is narrowed to approximately 2 mm, from 7 mm.  Marked calcific atherosclerosis of the right greater than left carotid bifurcations, with severe narrowing, the origin of the right internal carotid artery is narrowed to approximately 1 mm, and measures 5 mm distally (approximately 80% stenosis). The origin of the left internal carotid artery is narrowed to 1.8 mm, the more distal left internal carotid artery is 5.3 mm (approximately 66% stenosis).  The distal cervical  internal carotid artery segments are widely patent.  Severe narrowing of the origin of the left, moderate to severe in narrowing of the origin the right vertebral arteries. Enhancement of left vertebral artery, to the level the proximal intra foraminal segment where the left vertebral artery is no longer visualized. Within the more distal left vertebral artery foraminal segment, image 143/342 is occlusive calcification, above this level there is reconstitution left vertebral artery, by apparent muscular branches. Subsequent severe narrowing of the left vertebral artery, from the transition intraforaminal to extra foraminal segment.  The right vertebral artery appears widely patent throughout all segments.  No pseudoaneurysm nor dissection.  Complex cystic and solid appearing 19 x 20 mm left thyroid nodule. Punctate calcifications of the right thyroid. Included view of the lung apices demonstrate severe centrilobular emphysema.  Aerodigestive tract is nonsuspicious. Normal appearance of major salivary glands. Degenerative changes cervical spine with grade 1 C4-5 anterolisthesis, on spondylotic  basis. Moderate to severe right C3-4 neural foraminal narrowing, severe right C5-6 neural foraminal narrowing.  Review of the MIP images confirms the above findings.  IMPRESSION: CTA head: Focal calcification though vertebrobasilar junction narrows the lumen by approximately 50%, no evidence of dissection. Generalized intracranial atherosclerosis, resulting in apparent less than 50% narrowing of the right supra clinoid internal carotid artery.  CTA neck: Severe stenosis of the right internal carotid artery by NASCET criteria (estimated at 50- 69% on carotid ultrasound April 05, 2007) and moderate stenosis of the left internal carotid artery origin by NASCET criteria.  Severe calcific atherosclerosis of the aortic arch, resulting in appearance severe stenosis of the origin of the brachiocephalic artery and left common carotid  artery. In addition, severely narrowed mid left common carotid artery due to calcific atherosclerosis.  Severe narrowing of the origin left vertebral artery, with exclusion of the proximal intraforaminal left vertebral artery, due to occlusive calcification, with reconstitution distally due to apparent muscular branches. Subsequent severe narrowing of the left vertebral artery from the transition of the intra foraminal extra foraminal segment.  19 x 20 mm complex cystic and solid left thyroid nodule would be better characterized on dedicated thyroid sonogram as clinically indicated.   Electronically Signed   By: Awilda Metro   On: 06/30/2013 17:42   Ct Cervical Spine Wo Contrast  06/30/2013   CLINICAL DATA:  Right-sided neck pain and dizziness.  EXAM: CT CERVICAL SPINE WITHOUT CONTRAST  TECHNIQUE: Multidetector CT imaging of the cervical spine was performed without intravenous contrast. Multiplanar CT image reconstructions were also generated.  COMPARISON:  None.  FINDINGS: The sagittal reformatted images from the CT angiogram of the head neck demonstrates moderate degenerative cervical spondylosis with multilevel disc disease and facet disease. There are fused facets and partial interbody fusion C3-4. No acute fractures identified. No destructive bony changes or worrisome bone lesion. The skullbase C1 and C1-2 articulations are maintained. There are moderate degenerative changes at C1-2. The dens is intact.  No large disc protrusions or significant spinal stenosis. The spinal canal is generous. There is mild right foraminal stenosis at C5-6 due to uncinate spurring. Mild bilateral foraminal stenosis at C6-7 due to uncinate spurring.  The lung apices are clear. Emphysematous changes are noted. The thyroid gland is enlarged and there is a mixed solid and cystic lesion in the left thyroid lobe. Recommend ultrasound correlation and potential biopsy.  IMPRESSION: Degenerative cervical spondylosis with multilevel  disc disease and facet disease.  No acute bony findings.  Mixed cystic and solid left thyroid lesion. Recommend ultrasound correlation and possible biopsy.   Electronically Signed   By: Loralie Champagne M.D.   On: 06/30/2013 18:15   Mr Maxine Glenn Head Wo Contrast  06/30/2013   CLINICAL DATA:  Dizziness. History of carotid artery stenosis. Stroke risk factors include hypertension, and hyperlipidemia.  EXAM: MRI HEAD WITHOUT CONTRAST  MRA HEAD WITHOUT CONTRAST  TECHNIQUE: Multiplanar, multiecho pulse sequences of the brain and surrounding structures were obtained without intravenous contrast. Angiographic images of the head were obtained using MRA technique without contrast.  COMPARISON:  CT head 06/29/2013 is unremarkable. Carotid Dopplers from 04/05/2007 revealed bilateral carotid artery stenosis not clearly flow reducing.  FINDINGS: MRI HEAD FINDINGS  No evidence for acute infarction, hemorrhage, mass lesion, hydrocephalus, or extra-axial fluid. Mild to moderate age-related cerebral and cerebellar atrophy. Moderate small vessel type changes throughout the periventricular and subcortical white matter. No large vessel or significant lacunar infarct.  Flow voids are maintained throughout the  carotid, basilar, and vertebral arteries. There are no areas of chronic hemorrhage. Possible subcentimeter T2 bright lesion right pituitary gland likely incidental cyst. No elevation of the pituitary contour or, cavernous sinus, or suprasellar mass lesion. Normal cerebellar tonsils and upper cervical region  Visualized calvarium, skull base, and upper cervical osseous structures unremarkable. Scalp and extracranial soft tissues, orbits, sinuses, and mastoids show no acute process.  MRA HEAD FINDINGS  Mild non stenotic irregularity both internal carotid arteries. Slight medial outpouching left cavernous ICA could be atheromatous aneurysmal dilatation. No visible intracranial berry aneurysm.  Moderate narrowing of the proximal anterior  cerebral artery on the right unlikely to be clinically significant with a normal and dominant left A1 ACA. No proximal MCA stenosis. Intact circle of Willis as with bilateral patent posterior communicating arteries.  Mild to moderate irregularity of the proximal basilar artery. Question focal stenosis versus asymmetric sensation versus localized dissection. Right vertebral is the dominant contributor with left vertebral smaller but patent. No PCA stenosis. No cerebellar branch occlusion.  IMPRESSION: No acute intracranial abnormality. Mild to moderate age-related atrophy and chronic microvascular ischemic change.  Possible subcentimeter lesion within the right pituitary gland, likely incidental cyst.  Mild to moderate irregularity of the proximal basilar artery; question focal stenosis versus asymmetric fenestration versus localized dissection. Consider CTA head neck for further evaluation.   Electronically Signed   By: Davonna Belling M.D.   On: 06/30/2013 09:55   Mr Brain Wo Contrast  06/30/2013   CLINICAL DATA:  Dizziness. History of carotid artery stenosis. Stroke risk factors include hypertension, and hyperlipidemia.  EXAM: MRI HEAD WITHOUT CONTRAST  MRA HEAD WITHOUT CONTRAST  TECHNIQUE: Multiplanar, multiecho pulse sequences of the brain and surrounding structures were obtained without intravenous contrast. Angiographic images of the head were obtained using MRA technique without contrast.  COMPARISON:  CT head 06/29/2013 is unremarkable. Carotid Dopplers from 04/05/2007 revealed bilateral carotid artery stenosis not clearly flow reducing.  FINDINGS: MRI HEAD FINDINGS  No evidence for acute infarction, hemorrhage, mass lesion, hydrocephalus, or extra-axial fluid. Mild to moderate age-related cerebral and cerebellar atrophy. Moderate small vessel type changes throughout the periventricular and subcortical white matter. No large vessel or significant lacunar infarct.  Flow voids are maintained throughout the  carotid, basilar, and vertebral arteries. There are no areas of chronic hemorrhage. Possible subcentimeter T2 bright lesion right pituitary gland likely incidental cyst. No elevation of the pituitary contour or, cavernous sinus, or suprasellar mass lesion. Normal cerebellar tonsils and upper cervical region  Visualized calvarium, skull base, and upper cervical osseous structures unremarkable. Scalp and extracranial soft tissues, orbits, sinuses, and mastoids show no acute process.  MRA HEAD FINDINGS  Mild non stenotic irregularity both internal carotid arteries. Slight medial outpouching left cavernous ICA could be atheromatous aneurysmal dilatation. No visible intracranial berry aneurysm.  Moderate narrowing of the proximal anterior cerebral artery on the right unlikely to be clinically significant with a normal and dominant left A1 ACA. No proximal MCA stenosis. Intact circle of Willis as with bilateral patent posterior communicating arteries.  Mild to moderate irregularity of the proximal basilar artery. Question focal stenosis versus asymmetric sensation versus localized dissection. Right vertebral is the dominant contributor with left vertebral smaller but patent. No PCA stenosis. No cerebellar branch occlusion.  IMPRESSION: No acute intracranial abnormality. Mild to moderate age-related atrophy and chronic microvascular ischemic change.  Possible subcentimeter lesion within the right pituitary gland, likely incidental cyst.  Mild to moderate irregularity of the proximal basilar artery; question focal stenosis  versus asymmetric fenestration versus localized dissection. Consider CTA head neck for further evaluation.   Electronically Signed   By: Davonna Belling M.D.   On: 06/30/2013 09:55    2D ECHO: Study Conclusions  - Left ventricle: The cavity size was normal. Wall thickness was normal. Systolic function was normal. The estimated ejection fraction was in the range of 60% to 65%. Wall motion was normal;  there were no regional wall motion abnormalities. Doppler parameters are consistent with abnormal left ventricular relaxation (grade 1 diastolic dysfunction). - Right atrium: The atrium was mildly dilated.    Disposition and Follow-up: Discharge Orders   Future Appointments Provider Department Dept Phone   07/10/2013 2:30 PM Nada Libman, MD [facility] [phone]   07/28/2013 7:30 AM Sherrie George, MD [facility] [phone]   09/25/2013 11:00 AM [facility] [place] [facility] [address] [phone]   09/25/2013 12:00 PM Nada Libman, MD [facility] [phone]   Future Orders Complete By Expires   Diet - low sodium heart healthy  As directed    Increase activity slowly  As directed        DISPOSITION:  home DIET: Heart healthy diet ACTIVITY: As tolerated  DISCHARGE FOLLOW-UP Follow-up Information   Follow up with Gates Rigg, MD. Call on 07/03/2013. (for appointment, patient needs appointment for TCD's procedure and follow-up)    Specialties:  Neurology, Radiology   Contact information:   [address] [place] [address] [phone]       Follow up with Jorge Ny, MD On 07/10/2013. (at 2:30PM)    Specialty:  Vascular Surgery   Contact information:   [address] [place] [address] [phone]       Follow up with Bennie Pierini, FNP. Schedule an appointment as soon as possible for a visit in 10 days. (follow results of thyroid studies and hospital follow-up)    Specialty:  Nurse Practitioner   Contact information:   [address] [place] [address] [phone]       Time spent on Discharge: 40 mins  Signed:   RAI,RIPUDEEP M.D. [facility] 07/01/2013, 12:15 PM Pager: [phone]

## 2013-07-01 NOTE — Care Management Note (Signed)
    Page 1 of 1   07/01/2013     6:08:43 PM   CARE MANAGEMENT NOTE 07/01/2013  Patient:  [name], [name]   Account Number:  [id number]  Date Initiated:  07/01/2013  Documentation initiated by:  [facility]  Subjective/Objective Assessment:   adm: dizziness and fall off toilet at home.     Action/Plan:   discharge plannning   Anticipated DC Date:  07/01/2013   Anticipated DC Plan:  HOME/SELF CARE      DC Planning Services  CM consult      Choice offered to / List presented to:             Status of service:  Completed, signed off Medicare Important Message given?   (If response is "NO", the following Medicare IM given date fields will be blank) Date Medicare IM given:   Date Additional Medicare IM given:    Discharge Disposition:  HOME/SELF CARE  Per UR Regulation:    If discussed at Long Length of Stay Meetings, dates discussed:    Comments:  07/01/13 15:00 CM spoke with pt in room to give [facility] and instruct to call on Monday morning 07/03/13 to get an appt.  [facility] along with  facesheet, DC summary, H&P, PT notes.  No other CM needs were communicated.  Freddy Jaksch, BSN, CM [phone].

## 2013-07-01 NOTE — Progress Notes (Signed)
Physical Therapy Treatment Patient Details Name: [name] MRN: [id number] DOB: October 09, 1930 Today's Date: 07/01/2013 Time: 1410-1436 PT Time Calculation (min): 26 min  PT Assessment / Plan / Recommendation  History of Present Illness Patient is an 79 y/o female admitted with dizziness and fall off toilet at home.   PT Comments   Pt subjectively felt much better today, but when [employer] preformed she still had (+) symptoms and (+) rotational nystagmus.  Treated with Epley's Maneuver x 2, second time no symptoms or nystagmus.  Pt encouraged to still f/u with outpatient PT vestibular evaluation to make sure she did clear all symptoms.  She agreed that she would.    Follow Up Recommendations  Outpatient PT     Does the patient have the potential to tolerate intense rehabilitation    NA  Barriers to Discharge   None      Equipment Recommendations  Rolling walker with 5" wheels    Recommendations for Other Services   None  Frequency Min 3X/week   Progress towards PT Goals Progress towards PT goals: Progressing toward goals  Plan Current plan remains appropriate    Precautions / Restrictions Precautions Precautions: Fall Precaution Comments: due to residual vertigo   Pertinent Vitals/Pain See vitals flow sheet.    Mobility  Bed Mobility Bed Mobility: Supine to Sit;Sit to Supine;Scooting to HOB Supine to Sit: 4: Min assist;HOB flat Sit to Supine: 4: Min assist;HOB flat Details for Bed Mobility Assistance: while doing [employer], normally independent.  Transfers Transfers: Sit to Stand;Stand to Sit Sit to Stand: 7: Independent Stand to Sit: 7: Independent Details for Transfer Assistance: no assist needed to move about the room Ambulation/Gait Ambulation/Gait Assistance: Not tested (comment)    Exercises Other Exercises Other Exercises: Preformed Epley's maneuver x 2 due to (+) R dix Hallpike.  First time cleared the crystals completely and she  had no symptoms ( subjective or objective) the second time.  Pt instructed to still f/u for vertigo assessment as an outpatient even if she thinks it has cleared completely.       PT Goals (current goals can now be found in the care plan section) Acute Rehab PT Goals Patient Stated Goal: to go home and not be dizzy  Visit Information  Last PT Received On: 07/01/13 Assistance Needed: +1 History of Present Illness: Patient is an 79 y/o female admitted with dizziness and fall off toilet at home.    Subjective Data  Subjective: Pt reports she feels much better today.  She is moving around the room without assistance.   Patient Stated Goal: to go home and not be dizzy   Cognition  Cognition Arousal/Alertness: Awake/alert Behavior During Therapy: WFL for tasks assessed/performed Overall Cognitive Status: Within Functional Limits for tasks assessed    Balance  Balance Balance Assessed: Yes Static Sitting Balance Static Sitting - Balance Support: No upper extremity supported;Feet supported Static Sitting - Level of Assistance: 7: Independent Static Standing Balance Static Standing - Balance Support: No upper extremity supported Static Standing - Level of Assistance: 7: Independent Dynamic Standing Balance Dynamic Standing - Balance Support: No upper extremity supported Dynamic Standing - Level of Assistance: 5: Stand by assistance  End of Session PT - End of Session Activity Tolerance: Patient tolerated treatment well Patient left: in bed;Other (comment) (seated EOB, ready for d/c and RN instructions) Nurse Communication: Other (comment) (Pt ready for d/c instructions)    Lurena Joiner B. Saqib Cazarez, PT, DPT [phone]  07/01/2013, 3:23 PM

## 2013-07-01 NOTE — Progress Notes (Signed)
Patient ID: [name]  female  XBJ:[id number]    DOB: [birth year]    DOA: 06/29/2013  PCP: Bennie Pierini, FNP  Assessment/Plan: Principal Problem:   Dizziness: Differential diagnoses include posterior circulation TIA/  vs bilateral carotid artery disease, aortic insufficiency, possibly vertigo - MRI of the brain showed no acute infarct or hemorrhage - MRA mild to moderate irregularity of the proximal basilar artery, question focal stenosis versus localized dissection versus asymmetric fenestration - CT angiogram of the head and neck showed focal calcification through vertebrobasilar junction 50%, no dissection, severe stenosis of the right internal carotid artery, moderate stenosis of left internal carotid artery. 19x20 MM complex cystic and solid left thyroid nodule - Appreciate neurology following, recommended TCD with microemboli monitoring to provide further information regarding the status of anterior and posterior circulation - Per recommendations, added Plavix, continue aspirin - Vascular surgery, to high risk for right carotid endarterectomy and especially her symptoms and severe right ICA stenosis seems to be unrelated to the right anterior circulation - Added meclizine, patient reports that she had vestibular exercises yesterday which did help her dizziness, outpatient PT  Thyroid nodule : - Thyroid ultrasound, thyroid panel ordered     DYSLIPIDEMIA - Continue Lipitor    HYPERTENSION: Stable    CAROTID STENOSIS: As #1    Murmur- 2-D echo 06/30/2013 showed EF of 60-65%, grade 1 diastolic dysfunction , no aortic stenosis or regurgitation   DVT Prophylaxis:  Code Status:  Disposition: Likely tomorrow after TCD, thyroid ultrasound     Subjective: Patient feels better, after vascular exercises yesterday, still has mild lightheadedness when she gets up from the bed   Objective: Weight change:   Intake/Output Summary (Last 24 hours) at 07/01/13 0939 Last data  filed at 07/01/13 0646  Gross per 24 hour  Intake 1126.75 ml  Output      0 ml  Net 1126.75 ml   Blood pressure 143/65, pulse 71, temperature 97.1 F (36.2 C), temperature source Oral, resp. rate 16, height 5\' 7"  (1.702 m), weight 57.153 kg (126 lb), SpO2 98.00%.  Physical Exam: General: A x O x3 HEENT: anicteric sclera, PERLA, EOMI, + carotid bruit bilaterally CVS: S1-S2 clear, 3/6 SEM Chest: CTAB Abdomen: soft NT, ND, NBS Ext: no c/c/e Neuro: Cranial nerves II-XII intact, no fnd  Lab Results: Basic Metabolic Panel:  Recent Labs Lab 06/29/13 0500 06/30/13 0341  NA 135 136  K 4.1 4.5  CL 103 106  CO2 22 22  GLUCOSE 108* 94  BUN 17 12  CREATININE 0.93 0.89  CALCIUM 9.1 8.6   Liver Function Tests: No results found for this basename: AST, ALT, ALKPHOS, BILITOT, PROT, ALBUMIN,  in the last 168 hours No results found for this basename: LIPASE, AMYLASE,  in the last 168 hours No results found for this basename: AMMONIA,  in the last 168 hours CBC:  Recent Labs Lab 06/29/13 0500 06/30/13 0341  WBC 5.2 5.5  NEUTROABS 3.5  --   HGB 9.4* 8.6*  HCT 27.4* 25.4*  MCV 86.7 87.3  PLT 222 208   Cardiac Enzymes: No results found for this basename: CKTOTAL, CKMB, CKMBINDEX, TROPONINI,  in the last 168 hours BNP: No components found with this basename: POCBNP,  CBG: No results found for this basename: GLUCAP,  in the last 168 hours   Micro Results: No results found for this or any previous visit (from the past 240 hour(s)).  Studies/Results: Ct Head Wo Contrast  06/29/2013   CLINICAL DATA:  Dizziness, seeing black spots  EXAM: CT HEAD WITHOUT CONTRAST  TECHNIQUE: Contiguous axial images were obtained from the base of the skull through the vertex without intravenous contrast.  COMPARISON:  None.  FINDINGS: No skull fracture is noted. No intracranial hemorrhage, mass effect or midline shift. Paranasal sinuses and mastoid air cells are unremarkable. Bilateral parenchymal  punctate calcification may represent sequela from prior infection. Mild cerebral atrophy. No acute cortical infarction. No mass lesion is noted on this unenhanced scan.  IMPRESSION: No acute intracranial abnormality. Mild cerebral atrophy.   Electronically Signed   By: Natasha Mead M.D.   On: 06/29/2013 08:33   Mr Maxine Glenn Head Wo Contrast  06/30/2013   CLINICAL DATA:  Dizziness. History of carotid artery stenosis. Stroke risk factors include hypertension, and hyperlipidemia.  EXAM: MRI HEAD WITHOUT CONTRAST  MRA HEAD WITHOUT CONTRAST  TECHNIQUE: Multiplanar, multiecho pulse sequences of the brain and surrounding structures were obtained without intravenous contrast. Angiographic images of the head were obtained using MRA technique without contrast.  COMPARISON:  CT head 06/29/2013 is unremarkable. Carotid Dopplers from 04/05/2007 revealed bilateral carotid artery stenosis not clearly flow reducing.  FINDINGS: MRI HEAD FINDINGS  No evidence for acute infarction, hemorrhage, mass lesion, hydrocephalus, or extra-axial fluid. Mild to moderate age-related cerebral and cerebellar atrophy. Moderate small vessel type changes throughout the periventricular and subcortical white matter. No large vessel or significant lacunar infarct.  Flow voids are maintained throughout the carotid, basilar, and vertebral arteries. There are no areas of chronic hemorrhage. Possible subcentimeter T2 bright lesion right pituitary gland likely incidental cyst. No elevation of the pituitary contour or, cavernous sinus, or suprasellar mass lesion. Normal cerebellar tonsils and upper cervical region  Visualized calvarium, skull base, and upper cervical osseous structures unremarkable. Scalp and extracranial soft tissues, orbits, sinuses, and mastoids show no acute process.  MRA HEAD FINDINGS  Mild non stenotic irregularity both internal carotid arteries. Slight medial outpouching left cavernous ICA could be atheromatous aneurysmal dilatation. No  visible intracranial berry aneurysm.  Moderate narrowing of the proximal anterior cerebral artery on the right unlikely to be clinically significant with a normal and dominant left A1 ACA. No proximal MCA stenosis. Intact circle of Willis as with bilateral patent posterior communicating arteries.  Mild to moderate irregularity of the proximal basilar artery. Question focal stenosis versus asymmetric sensation versus localized dissection. Right vertebral is the dominant contributor with left vertebral smaller but patent. No PCA stenosis. No cerebellar branch occlusion.  IMPRESSION: No acute intracranial abnormality. Mild to moderate age-related atrophy and chronic microvascular ischemic change.  Possible subcentimeter lesion within the right pituitary gland, likely incidental cyst.  Mild to moderate irregularity of the proximal basilar artery; question focal stenosis versus asymmetric fenestration versus localized dissection. Consider CTA head neck for further evaluation.   Electronically Signed   By: Davonna Belling M.D.   On: 06/30/2013 09:55   Mr Brain Wo Contrast  06/30/2013   CLINICAL DATA:  Dizziness. History of carotid artery stenosis. Stroke risk factors include hypertension, and hyperlipidemia.  EXAM: MRI HEAD WITHOUT CONTRAST  MRA HEAD WITHOUT CONTRAST  TECHNIQUE: Multiplanar, multiecho pulse sequences of the brain and surrounding structures were obtained without intravenous contrast. Angiographic images of the head were obtained using MRA technique without contrast.  COMPARISON:  CT head 06/29/2013 is unremarkable. Carotid Dopplers from 04/05/2007 revealed bilateral carotid artery stenosis not clearly flow reducing.  FINDINGS: MRI HEAD FINDINGS  No evidence for acute infarction, hemorrhage, mass lesion, hydrocephalus, or extra-axial fluid. Mild to  moderate age-related cerebral and cerebellar atrophy. Moderate small vessel type changes throughout the periventricular and subcortical white matter. No large  vessel or significant lacunar infarct.  Flow voids are maintained throughout the carotid, basilar, and vertebral arteries. There are no areas of chronic hemorrhage. Possible subcentimeter T2 bright lesion right pituitary gland likely incidental cyst. No elevation of the pituitary contour or, cavernous sinus, or suprasellar mass lesion. Normal cerebellar tonsils and upper cervical region  Visualized calvarium, skull base, and upper cervical osseous structures unremarkable. Scalp and extracranial soft tissues, orbits, sinuses, and mastoids show no acute process.  MRA HEAD FINDINGS  Mild non stenotic irregularity both internal carotid arteries. Slight medial outpouching left cavernous ICA could be atheromatous aneurysmal dilatation. No visible intracranial berry aneurysm.  Moderate narrowing of the proximal anterior cerebral artery on the right unlikely to be clinically significant with a normal and dominant left A1 ACA. No proximal MCA stenosis. Intact circle of Willis as with bilateral patent posterior communicating arteries.  Mild to moderate irregularity of the proximal basilar artery. Question focal stenosis versus asymmetric sensation versus localized dissection. Right vertebral is the dominant contributor with left vertebral smaller but patent. No PCA stenosis. No cerebellar branch occlusion.  IMPRESSION: No acute intracranial abnormality. Mild to moderate age-related atrophy and chronic microvascular ischemic change.  Possible subcentimeter lesion within the right pituitary gland, likely incidental cyst.  Mild to moderate irregularity of the proximal basilar artery; question focal stenosis versus asymmetric fenestration versus localized dissection. Consider CTA head neck for further evaluation.   Electronically Signed   By: Davonna Belling M.D.   On: 06/30/2013 09:55    Medications: Scheduled Meds: . amLODipine  10 mg Oral Daily  . aspirin  81 mg Oral Daily  . atorvastatin  10 mg Oral Daily  .  cholecalciferol  1,000 Units Oral Daily  . clopidogrel  75 mg Oral Q breakfast  . docusate sodium  100 mg Oral BID  . hydrALAZINE  37.5 mg Oral BID  . lisinopril  40 mg Oral Daily  . meclizine  12.5 mg Oral BID  . multivitamin-lutein  1 capsule Oral Daily  . senna  1 tablet Oral BID  . sodium chloride  3 mL Intravenous Q12H  . sodium chloride  3 mL Intravenous Q12H      LOS: 2 days   RAI,RIPUDEEP M.D. [facility] 07/01/2013, 9:39 AM Pager: [phone]  If 7PM-7AM, please contact night-coverage [email or web address] Password [email or web address]

## 2013-07-01 NOTE — Progress Notes (Signed)
[facility] [place]  Subjective  -   The patient still continues to complain of dizziness.  She denies localizing symptoms.  Specifically she states that her vision troubles have been with both eyes.  She still complains the room spins around.  She denies numbness or weakness in either upper or lower extremity.   Physical Exam:  Cardiovascular: Regular the Pulmonary: Respirations are nonlabored Neuro: Nonfocal exam       Assessment/Plan:    I have reviewed the patient's CT angiogram as well as her carotid Doppler studies.  There appears to be no significant progression from exams earlier this year.  Her complaints are not consistent with a TIA vs. stroke in the distribution of the right carotid artery.  Her symptoms appear to be more posterior circulation.  I agree with neurology, I would continue to treat her medically.  I will have her followup in my office with her regular scheduled appointment.  I do not feel right carotid endarterectomy with or without innominate artery reconstruction is warranted at this time, as I feel that she continues to remain asymptomatic from this disease process.  BRABHAM IV, V. WELLS 07/01/2013 9:41 AM --  Filed Vitals:   07/01/13 0424  BP: 143/65  Pulse: 71  Temp: 97.1 F (36.2 C)  Resp: 16    Intake/Output Summary (Last 24 hours) at 07/01/13 0941 Last data filed at 07/01/13 0646  Gross per 24 hour  Intake 1126.75 ml  Output      0 ml  Net 1126.75 ml     Laboratory CBC    Component Value Date/Time   WBC 5.5 06/30/2013 0341   HGB 8.6* 06/30/2013 0341   HCT 25.4* 06/30/2013 0341   PLT 208 06/30/2013 0341    BMET    Component Value Date/Time   NA 136 06/30/2013 0341   K 4.5 06/30/2013 0341   CL 106 06/30/2013 0341   CO2 22 06/30/2013 0341   GLUCOSE 94 06/30/2013 0341   BUN 12 06/30/2013 0341   CREATININE 0.89 06/30/2013 0341   CREATININE 0.94 02/17/2013 0947   CALCIUM 8.6 06/30/2013 0341   GFRNONAA 60*  06/30/2013 0341   GFRAA 69* 06/30/2013 0341    COAG Lab Results  Component Value Date   INR 0.9 04/21/2007   INR 1.0 03/22/2007   No results found for this basename: PTT    Antibiotics Anti-infectives   None       V. Charlena Cross, M.D. [facility] [place] Office: [phone] Pager:  [phone]

## 2013-07-03 ENCOUNTER — Ambulatory Visit: Payer: Medicare Other | Admitting: Nurse Practitioner

## 2013-07-07 ENCOUNTER — Encounter: Payer: Self-pay | Admitting: Surgery

## 2013-07-10 ENCOUNTER — Encounter: Payer: Self-pay | Admitting: Surgery

## 2013-07-10 ENCOUNTER — Ambulatory Visit (INDEPENDENT_AMBULATORY_CARE_PROVIDER_SITE_OTHER): Payer: Medicare Other | Admitting: Surgery

## 2013-07-10 DIAGNOSIS — I6529 Occlusion and stenosis of unspecified carotid artery: Secondary | ICD-10-CM

## 2013-07-10 DIAGNOSIS — I739 Peripheral vascular disease, unspecified: Secondary | ICD-10-CM

## 2013-07-10 MED ORDER — CILOSTAZOL 100 MG PO TABS: 100.0000 mg | ORAL_TABLET | Freq: Two times a day (BID) | ORAL | Status: DC

## 2013-07-10 NOTE — Progress Notes (Signed)
[facility] [place]   Patient name: [name] MRN: [id number] DOB: May 23, 1931 Sex: female     Chief Complaint  Patient presents with  . Re-evaluation    HISTORY OF PRESENT ILLNESS: This is an 79 year old female well known to me for her carotid occlusive disease.  She was recently in the hospital with symptoms of stenting and dizziness.  General and a thorough workup.  It was felt that this was not secondary to her right innominate stenosis and right carotid stenosis.  She has been getting better at home.  She had recently been complaining of pain in her legs with activity, particularly when walking up an incline.  She describes a burning pain in the back of her legs.  Since she has returned from the hospital, she has not been very active and therefore it has not experienced any of the symptoms in several weeks.  She denies a history of nonhealing ulcers.  She denies rest pain.  Past Medical History  Diagnosis Date  . Hypertension     intolerant to Maxzide due to hyponatremia, intolerant to Clonidine due to slow HR/fatigue  . Hyperlipidemia   . Carotid artery occlusion   . Macular degeneration     In both eyes  . Arthritis   . Hiatal hernia   . Chronic kidney disease   . DVT (deep venous thrombosis)   . Anemia   . Irregular heartbeat   . Family history of anesthesia complication     daughter has nausea  . Coronary artery disease     Past Surgical History  Procedure Laterality Date  . Cataract extraction    . Lumbar disc surgery    . Incontinence surgery    . Abdominal hysterectomy  1971  . Neck muscle surgery    . Varicose vein surgery      left leg  . Eye surgery    . Cholecystectomy  2004    Gall Bladder sling    History   Social History  . Marital Status: Widowed    Spouse Name: N/A    Number of Children: N/A  . Years of Education: N/A   Occupational History  . Not on file.   Social History Main Topics  . Smoking status: Former  Smoker    Quit date: 08/24/2009  . Smokeless tobacco: Never Used  . Alcohol Use: No  . Drug Use: No  . Sexual Activity: No   Other Topics Concern  . Not on file   Social History Narrative  . No narrative on file    Family History  Problem Relation Age of Onset  . Cancer Mother     [name]  . Diabetes Sister   . Cancer Sister   . Hyperlipidemia Sister   . Heart attack Sister   . Diabetes Son   . Hyperlipidemia Daughter     Allergies as of 07/10/2013 - Review Complete 07/10/2013  Allergen Reaction Noted  . Penicillins Rash 08/02/2009    Current Outpatient Prescriptions on File Prior to Visit  Medication Sig Dispense Refill  . amLODipine (NORVASC) 10 MG tablet Take 10 mg by mouth daily.      Marland Kitchen aspirin 81 MG tablet Take 81 mg by mouth daily.        Marland Kitchen atorvastatin (LIPITOR) 10 MG tablet Take 1 tablet (10 mg total) by mouth daily.  30 tablet  5  . Calcium Citrate (CALCITRATE PO) Take 1 tablet by mouth daily.       Marland Kitchen  cholecalciferol (VITAMIN D) 1000 UNITS tablet Take 1,000 Units by mouth daily.      . clopidogrel (PLAVIX) 75 MG tablet Take 1 tablet (75 mg total) by mouth daily with breakfast.  30 tablet  5  . hydrALAZINE (APRESOLINE) 25 MG tablet Take 1.5 tablets (37.5 mg total) by mouth 2 (two) times daily.  135 tablet  3  . ibuprofen (ADVIL,MOTRIN) 200 MG tablet Take 200 mg by mouth every 6 (six) hours as needed for mild pain.       Marland Kitchen lisinopril (PRINIVIL,ZESTRIL) 40 MG tablet Take 1 tablet (40 mg total) by mouth daily.  30 tablet  5  . meclizine (ANTIVERT) 12.5 MG tablet Take 1 tablet (12.5 mg total) by mouth 2 (two) times daily as needed for dizziness (also available over the counter).  60 tablet  3  . Multiple Vitamins-Minerals (PRESERVISION AREDS) CAPS Take 1 capsule by mouth daily.       Marland Kitchen senna-docusate (SENOKOT-S) 8.6-50 MG per tablet Take 1-2 tablets by mouth at bedtime as needed for mild constipation.  60 tablet  3  . UNABLE TO FIND Outpatient physical therapy,  preferably Neuro- rehabilitation center   Diagnoses: Vertebrobasilar insufficiency  1 Mutually Defined  0   No current facility-administered medications on file prior to visit.     REVIEW OF SYSTEMS: Feeling of spinning is improving. Her bedroom has been moved down to the ground floor She does have worsening symptoms of leg pain when she walks up and cons, however this does not happen on a regular basis  PHYSICAL EXAMINATION:   Vital signs are BP 174/51  Pulse 65  Ht 5\' 7"  (1.702 m)  Wt 128 lb 4.8 oz (58.196 kg)  BMI 20.09 kg/m2  SpO2 100% General: The patient appears their stated age. HEENT:  No gross abnormalities Pulmonary:  Non labored breathing Musculoskeletal: There are no major deformities. Neurologic: No focal weakness or paresthesias are detected, Skin: There are no ulcer or rashes noted. Psychiatric: The patient has normal affect. Cardiovascular: There is a regular rate and rhythm without significant murmur appreciated.  I cannot palpate pulses in her leg   Diagnostic Studies I have reviewed her outside ultrasound shows an ABI of 0.80 on the right and 0.50 on the left  Assessment: Bilateral leg pain Plan: Based on reviewing the patient's ultrasound and description of her symptoms, I do feel that these are related to her peripheral vascular disease, however she has not been very active currently.  She did not report having any symptoms recently.  I have encouraged her to ambulate.  She now realizes that she would not cause any additional damage by walking through the pain.  I do not feel that her symptoms are significant enough to consider revascularization.  However, I have recommended a trial of cilostazol.  We discussed the potential side effects.  She will come by to see me in January and see how she is doing.  At that time, we'll also evaluate her carotid disease again.  Jorge Ny, M.D. [facility] [place] Office:  [phone] Pager:  [phone]

## 2013-07-11 ENCOUNTER — Telehealth: Payer: Self-pay | Admitting: Nurse Practitioner

## 2013-07-11 ENCOUNTER — Ambulatory Visit: Payer: Medicare Other | Attending: Nurse Practitioner | Admitting: Physical Therapy

## 2013-07-11 DIAGNOSIS — IMO0001 Reserved for inherently not codable concepts without codable children: Secondary | ICD-10-CM | POA: Insufficient documentation

## 2013-07-11 DIAGNOSIS — R262 Difficulty in walking, not elsewhere classified: Secondary | ICD-10-CM | POA: Insufficient documentation

## 2013-07-11 NOTE — Telephone Encounter (Signed)
Pt needed 3 mth rck with MMM appt scheduled

## 2013-07-13 ENCOUNTER — Observation Stay (HOSPITAL_COMMUNITY)
Admission: EM | Admit: 2013-07-13 | Discharge: 2013-07-16 | Disposition: A | Discharge status: Home / Self Care | Payer: Medicare Other | Attending: Internal Medicine | Admitting: Internal Medicine

## 2013-07-13 ENCOUNTER — Encounter (HOSPITAL_COMMUNITY): Payer: Self-pay | Admitting: Emergency Medicine

## 2013-07-13 DIAGNOSIS — D649 Anemia, unspecified: Secondary | ICD-10-CM | POA: Insufficient documentation

## 2013-07-13 DIAGNOSIS — Q211 Atrial septal defect: Secondary | ICD-10-CM | POA: Insufficient documentation

## 2013-07-13 DIAGNOSIS — E042 Nontoxic multinodular goiter: Secondary | ICD-10-CM | POA: Insufficient documentation

## 2013-07-13 DIAGNOSIS — K449 Diaphragmatic hernia without obstruction or gangrene: Secondary | ICD-10-CM | POA: Insufficient documentation

## 2013-07-13 DIAGNOSIS — I6529 Occlusion and stenosis of unspecified carotid artery: Secondary | ICD-10-CM | POA: Diagnosis present

## 2013-07-13 DIAGNOSIS — Z87891 Personal history of nicotine dependence: Secondary | ICD-10-CM | POA: Insufficient documentation

## 2013-07-13 DIAGNOSIS — R002 Palpitations: Principal | ICD-10-CM | POA: Diagnosis present

## 2013-07-13 DIAGNOSIS — I82409 Acute embolism and thrombosis of unspecified deep veins of unspecified lower extremity: Secondary | ICD-10-CM | POA: Insufficient documentation

## 2013-07-13 DIAGNOSIS — E785 Hyperlipidemia, unspecified: Secondary | ICD-10-CM | POA: Diagnosis present

## 2013-07-13 DIAGNOSIS — M79602 Pain in left arm: Secondary | ICD-10-CM

## 2013-07-13 DIAGNOSIS — I739 Peripheral vascular disease, unspecified: Secondary | ICD-10-CM | POA: Insufficient documentation

## 2013-07-13 DIAGNOSIS — T2200XA Burn of unspecified degree of shoulder and upper limb, except wrist and hand, unspecified site, initial encounter: Secondary | ICD-10-CM

## 2013-07-13 DIAGNOSIS — Z9889 Other specified postprocedural states: Secondary | ICD-10-CM | POA: Insufficient documentation

## 2013-07-13 DIAGNOSIS — R011 Cardiac murmur, unspecified: Secondary | ICD-10-CM | POA: Diagnosis present

## 2013-07-13 DIAGNOSIS — R42 Dizziness and giddiness: Secondary | ICD-10-CM | POA: Insufficient documentation

## 2013-07-13 DIAGNOSIS — N189 Chronic kidney disease, unspecified: Secondary | ICD-10-CM | POA: Insufficient documentation

## 2013-07-13 DIAGNOSIS — H3530 Unspecified macular degeneration: Secondary | ICD-10-CM | POA: Insufficient documentation

## 2013-07-13 DIAGNOSIS — Q2111 Secundum atrial septal defect: Secondary | ICD-10-CM | POA: Insufficient documentation

## 2013-07-13 DIAGNOSIS — I10 Essential (primary) hypertension: Secondary | ICD-10-CM | POA: Diagnosis present

## 2013-07-13 DIAGNOSIS — M129 Arthropathy, unspecified: Secondary | ICD-10-CM | POA: Insufficient documentation

## 2013-07-13 DIAGNOSIS — M79603 Pain in arm, unspecified: Secondary | ICD-10-CM | POA: Diagnosis present

## 2013-07-13 HISTORY — DX: Atrial septal defect: Q21.1

## 2013-07-13 HISTORY — DX: Cardiac murmur, unspecified: R01.1

## 2013-07-13 HISTORY — DX: Peripheral vascular disease, unspecified: I73.9

## 2013-07-13 HISTORY — DX: Nontoxic multinodular goiter: E04.2

## 2013-07-13 HISTORY — DX: Dizziness and giddiness: R42

## 2013-07-13 HISTORY — DX: Patent foramen ovale: Q21.12

## 2013-07-13 NOTE — ED Notes (Signed)
Pt reports she started taking a new medication this morning that appears to have created left arm pain, burning and weak on LUE which lasted 8am-11am.  Took second pill this eve at approx 2000, arm pain started again.  Weak grasp left hand began approx 15 min after taking medication.  "heart started pounding, throbbing and beating in arm, burning down to hand."

## 2013-07-13 NOTE — ED Notes (Signed)
MD at bedside. 

## 2013-07-14 ENCOUNTER — Ambulatory Visit: Payer: Medicare Other | Admitting: Physical Therapy

## 2013-07-14 ENCOUNTER — Encounter (HOSPITAL_COMMUNITY): Payer: Self-pay | Admitting: Physician Assistant

## 2013-07-14 ENCOUNTER — Emergency Department (HOSPITAL_COMMUNITY): Payer: Medicare Other

## 2013-07-14 DIAGNOSIS — T2200XA Burn of unspecified degree of shoulder and upper limb, except wrist and hand, unspecified site, initial encounter: Secondary | ICD-10-CM

## 2013-07-14 DIAGNOSIS — M79603 Pain in arm, unspecified: Secondary | ICD-10-CM | POA: Diagnosis present

## 2013-07-14 DIAGNOSIS — R002 Palpitations: Secondary | ICD-10-CM

## 2013-07-14 DIAGNOSIS — M6281 Muscle weakness (generalized): Secondary | ICD-10-CM

## 2013-07-14 DIAGNOSIS — R209 Unspecified disturbances of skin sensation: Secondary | ICD-10-CM

## 2013-07-14 DIAGNOSIS — R011 Cardiac murmur, unspecified: Secondary | ICD-10-CM

## 2013-07-14 DIAGNOSIS — M79609 Pain in unspecified limb: Secondary | ICD-10-CM

## 2013-07-14 DIAGNOSIS — I10 Essential (primary) hypertension: Secondary | ICD-10-CM

## 2013-07-14 DIAGNOSIS — E785 Hyperlipidemia, unspecified: Secondary | ICD-10-CM

## 2013-07-14 DIAGNOSIS — I739 Peripheral vascular disease, unspecified: Secondary | ICD-10-CM

## 2013-07-14 LAB — CBC WITH DIFFERENTIAL/PLATELET
Basophils Absolute: 0.1 10*3/uL (ref 0.0–0.1)
Basophils Relative: 1 % (ref 0–1)
HCT: 24.9 % — ABNORMAL LOW (ref 36.0–46.0)
Hemoglobin: 8.5 g/dL — ABNORMAL LOW (ref 12.0–15.0)
Lymphocytes Relative: 12 % (ref 12–46)
Lymphs Abs: 0.9 10*3/uL (ref 0.7–4.0)
MCHC: 34.1 g/dL (ref 30.0–36.0)
MCV: 87.1 fL (ref 78.0–100.0)
Monocytes Absolute: 0.7 10*3/uL (ref 0.1–1.0)
Monocytes Relative: 10 % (ref 3–12)
Neutro Abs: 5.4 10*3/uL (ref 1.7–7.7)
Neutrophils Relative %: 76 % (ref 43–77)
RDW: 14.3 % (ref 11.5–15.5)
WBC: 7.2 10*3/uL (ref 4.0–10.5)

## 2013-07-14 LAB — TROPONIN I
Troponin I: <0.3 ng/mL (ref <0.30)
Troponin I: <0.3 ng/mL (ref <0.30)
Troponin I: <0.3 ng/mL (ref <0.30)

## 2013-07-14 LAB — HEPATIC FUNCTION PANEL
ALT: 10 U/L (ref 0–35)
Alkaline Phosphatase: 63 U/L (ref 39–117)
Bilirubin, Direct: 0.1 mg/dL (ref 0.0–0.3)
Indirect Bilirubin: 0.4 mg/dL (ref 0.3–0.9)
Total Bilirubin: 0.5 mg/dL (ref 0.3–1.2)

## 2013-07-14 LAB — BASIC METABOLIC PANEL
BUN: 15 mg/dL (ref 6–23)
CO2: 22 mEq/L (ref 19–32)
Chloride: 106 mEq/L (ref 96–112)
Creatinine, Ser: 1.15 mg/dL — ABNORMAL HIGH (ref 0.50–1.10)
GFR calc Af Amer: 51 mL/min — ABNORMAL LOW (ref >90)
Potassium: 3.5 mEq/L (ref 3.5–5.1)

## 2013-07-14 MED ORDER — SODIUM CHLORIDE 0.9 % IJ SOLN: 3.0000 mL | INTRAMUSCULAR | Status: DC | PRN

## 2013-07-14 MED ORDER — MECLIZINE HCL 12.5 MG PO TABS
12.5000 mg | ORAL_TABLET | Freq: Two times a day (BID) | ORAL | Status: DC | PRN
  Filled 2013-07-14: qty 1

## 2013-07-14 MED ORDER — LISINOPRIL 40 MG PO TABS
40.0000 mg | ORAL_TABLET | Freq: Every day | ORAL | Status: DC
  Administered 2013-07-14 – 2013-07-16 (×3): 40 mg via ORAL
  Filled 2013-07-14 (×3): qty 1

## 2013-07-14 MED ORDER — SODIUM CHLORIDE 0.9 % IV SOLN: 250.0000 mL | INTRAVENOUS | Status: DC | PRN

## 2013-07-14 MED ORDER — ASPIRIN 81 MG PO CHEW
81.0000 mg | CHEWABLE_TABLET | Freq: Every day | ORAL | Status: DC
Start: 2013-07-14 — End: 2013-07-16
  Administered 2013-07-14 – 2013-07-16 (×3): 81 mg via ORAL
  Filled 2013-07-14 (×3): qty 1

## 2013-07-14 MED ORDER — SODIUM CHLORIDE 0.9 % IJ SOLN
3.0000 mL | Freq: Two times a day (BID) | INTRAMUSCULAR | Status: DC
  Administered 2013-07-14 – 2013-07-15 (×3): 3 mL via INTRAVENOUS

## 2013-07-14 MED ORDER — ATORVASTATIN CALCIUM 10 MG PO TABS
10.0000 mg | ORAL_TABLET | Freq: Every day | ORAL | Status: DC
  Filled 2013-07-14: qty 1

## 2013-07-14 MED ORDER — ASPIRIN 81 MG PO CHEW
162.0000 mg | CHEWABLE_TABLET | Freq: Once | ORAL | Status: AC
  Administered 2013-07-14: 162 mg via ORAL
  Filled 2013-07-14: qty 2

## 2013-07-14 MED ORDER — AMLODIPINE BESYLATE 10 MG PO TABS
10.0000 mg | ORAL_TABLET | Freq: Every day | ORAL | Status: DC
  Filled 2013-07-14: qty 1

## 2013-07-14 MED ORDER — CLOPIDOGREL BISULFATE 75 MG PO TABS
75.0000 mg | ORAL_TABLET | Freq: Every day | ORAL | Status: DC
  Administered 2013-07-14 – 2013-07-16 (×3): 75 mg via ORAL
  Filled 2013-07-14 (×3): qty 1

## 2013-07-14 MED ORDER — HYDRALAZINE HCL 25 MG PO TABS
37.5000 mg | ORAL_TABLET | Freq: Two times a day (BID) | ORAL | Status: DC
  Administered 2013-07-14 – 2013-07-16 (×5): 37.5 mg via ORAL
  Filled 2013-07-14 (×6): qty 1.5

## 2013-07-14 MED ORDER — SENNOSIDES-DOCUSATE SODIUM 8.6-50 MG PO TABS
1.0000 | ORAL_TABLET | Freq: Every evening | ORAL | Status: DC | PRN
  Filled 2013-07-14: qty 2

## 2013-07-14 MED ORDER — ATORVASTATIN CALCIUM 40 MG PO TABS
40.0000 mg | ORAL_TABLET | Freq: Every day | ORAL | Status: DC
  Administered 2013-07-15: 40 mg via ORAL
  Filled 2013-07-14 (×2): qty 1

## 2013-07-14 MED ORDER — ENOXAPARIN SODIUM 40 MG/0.4ML ~~LOC~~ SOLN
40.0000 mg | SUBCUTANEOUS | Status: DC
  Administered 2013-07-14 – 2013-07-15 (×2): 40 mg via SUBCUTANEOUS
  Filled 2013-07-14 (×3): qty 0.4

## 2013-07-14 MED ORDER — IBUPROFEN 200 MG PO TABS
200.0000 mg | ORAL_TABLET | Freq: Four times a day (QID) | ORAL | Status: DC | PRN
  Filled 2013-07-14: qty 1

## 2013-07-14 MED ORDER — VITAMIN D3 25 MCG (1000 UNIT) PO TABS
1000.0000 [IU] | ORAL_TABLET | Freq: Every day | ORAL | Status: DC
  Administered 2013-07-14 – 2013-07-16 (×3): 1000 [IU] via ORAL
  Filled 2013-07-14 (×3): qty 1

## 2013-07-14 MED ORDER — CARVEDILOL 3.125 MG PO TABS
3.1250 mg | ORAL_TABLET | Freq: Two times a day (BID) | ORAL | Status: DC
  Administered 2013-07-14 – 2013-07-16 (×4): 3.125 mg via ORAL
  Filled 2013-07-14 (×7): qty 1

## 2013-07-14 NOTE — Progress Notes (Signed)
ANTICOAGULATION CONSULT NOTE - Initial Consult  Pharmacy Consult for lovenox Indication: DVT prophylaxis  Allergies  Allergen Reactions  . Penicillins Rash    Oral swelling  . Clonidine Derivatives     Slow HR/fatigue   . Maxzide [Hydrochlorothiazide W-Triamterene]     hyponatremia    Patient Measurements: Height: 5\' 7"  (170.2 cm) Weight: 127 lb 6.4 oz (57.788 kg) IBW/kg (Calculated) : 61.6 Heparin Dosing Weight:   Vital Signs: Temp: 98 F (36.7 C) (11/21 0411) Temp src: Oral (11/21 0411) BP: 152/49 mmHg (11/21 0650) Pulse Rate: 68 (11/21 0650)  Labs:  Recent Labs  07/14/13 0031 07/14/13 0545 07/14/13 1120  HGB 8.5*  --   --   HCT 24.9*  --   --   PLT 226  --   --   CREATININE 1.15*  --   --   TROPONINI <0.30 <0.30 <0.30    Estimated Creatinine Clearance: 35.6 ml/min (by C-G formula based on Cr of 1.15).   Medical History: Past Medical History  Diagnosis Date  . Hypertension     a. Intolerant to Maxzide due to hyponatremia. b. intolerant to Clonidine due to slow HR/fatigue. c. Discrepancy in BP felt due to innominant stenosis.  . Hyperlipidemia   . Carotid artery occlusion     RICA 80-99%, LICA 40-59% by dopplers 06/2013. Also with cerebrovascular disease otherwise on MRA 06/2013.  . Macular degeneration     In both eyes - no longer drives.  . Arthritis   . Hiatal hernia   . Chronic kidney disease   . DVT (deep venous thrombosis)     a. Around age 73, details unclear (SVT vs DVT?), s/p vein stripping without recurrence.  . Anemia     a. "On/off my whole life" - etiology unclear. Has never required blood transfusion.  . Multiple thyroid nodules     a. By [place] 06/2013. Bx recommended.  Marland Kitchen PFO (patent foramen ovale)     a. Per echo in 12/2010.  Marland Kitchen PVD (peripheral vascular disease)     a. R innominant stenosis. b. 06/2013 ABI: 0.80 R, 0.50 L.  . Dizziness     a. Adm 06/2013: felt due to posterior circulation deficits from vertebrobasilar insufficiency and  possibly vertigo.  Marland Kitchen Heart murmur     a. Longstanding. mild MR on prior ECG, also may be in setting of known vasc dz.    Medications:  Scheduled:  . aspirin  81 mg Oral Daily  . [START ON 07/15/2013] atorvastatin  40 mg Oral q1800  . carvedilol  3.125 mg Oral BID WC  . cholecalciferol  1,000 Units Oral Daily  . clopidogrel  75 mg Oral Q breakfast  . enoxaparin (LOVENOX) injection  40 mg Subcutaneous Q24H  . hydrALAZINE  37.5 mg Oral BID  . lisinopril  40 mg Oral Daily  . sodium chloride  3 mL Intravenous Q12H    Assessment: Asked to dose lovenox for DVT prophylaxis probably due to low Crcl. It's over 10ml/min so will give normal dose for now   Plan:  Lovenox 40mg  SQ q24  Ulyses Southward [place] 07/14/2013,12:37 PM

## 2013-07-14 NOTE — Consult Note (Signed)
Cardiology Consultation Note  Patient ID: ROIZY HAROLD, MRN: [id number], DOB/AGE: [birth year] 79 y.o. Admit date: 07/13/2013   Date of Consult: 07/14/2013 Primary Physician: Bennie Pierini, FNP Primary Cardiologist: Tenny Craw  Chief Complaint: palpitations, L arm burning Reason for Consult: as above, in a patient without prior history of CAD but significant extensive PVD  HPI: Ms. Cutshaw is an 79 y/o F with history of PVD (carotid, LE), HTN, HLD, CKD stage II (baseline Cr appears ~0.9) who presented to [facility] with palpitations and transient burning sensation in L arm. She does not have prior history of CAD, cath, or stress test. She was recently in the hospital earlier this month for an episode of dizziness felt most likely due to posterior circulation deficits from vertebrobasilar insufficiency and possibly vertigo. MRI showed no acute infarct/hemorrhage and MRA had shown mild to moderate irregularity of the proximal basilar artery, question focal stenosis versus localized dissection versus asymmetric fenestration. CT angiogram of the head and neck showed focal calcification through vertebrobasilar junction 50%, no dissection, severe stenosis of the right internal carotid artery, moderate stenosis of left internal carotid artery. She was placed on ASA and Plavix. Neurology recommended TC with microemboli monitoring outpatient. Echo as below with normal LV function. She also had diagnosis of multiple thyroid nodules during that admission, biopsy recommended with normal TFTs. Vascular surgeon Dr. Myra Gianotti did not feel she had significant progression of carotid disease from prior exams this year. She saw him in followup on 07/10/13 with for eval of burning pain in the back of her legs when she walks up incline or stairs. ABI were 0.80 on R and 0.50 on the L. This claudication was felt due to PVD. Recommendation was to increase ambulation and start a trial of cilostazol. More invasive eval  was not felt to be warranted given that she can still ambulate over flat distances, albeit slowly, without pain.  Yesterday afternoon she had about an hour of pounding heartbeat into her neck and L arm burning. She thinks this occurred shortly after a dose of Pletal. She laid back in her recliner and symptoms resolved in less than an hour. Later that evening, she took her Pletal and shortly after began feeling palpitations, pounding in her chest/neck, and L arm burning with L hand tingling/numbness and inability to make a fist. No acute visual changes (permanently impaired due to MD), slurred speech, imbalance, focal weakness. She denies overt CP/pressure, just the pounding sensation over her L chest. She called EMS. She believes she was still having some palpitations in the ER at which time EKG showed sinus tach 110bpm with atrial bigeminy and mild diffuse ST depression. She received 2 baby aspirin in the ER. Symptoms resolved spontaneously in about an hour. Labs significant for negative troponins x 2, Hgb 8.5. Cr 1.15. CXR nonacute with emphysema and atherosclerosis; CT head nonacute. HR has improved to NSR and she is currently asymptomatic.  Hgb 8.5 - was 9.4 two weeks ago with unremarkable iron panel, 10-12 range 6 years ago, no other interim data. Pt reports chronic anemia but denies prior workup. She is vision impaired so has not been able to detect any blood in her stool or urine.  Past Medical History  Diagnosis Date  . Hypertension     a. Intolerant to Maxzide due to hyponatremia. b. intolerant to Clonidine due to slow HR/fatigue. c. Discrepancy in BP felt due to innominant stenosis.  . Hyperlipidemia   . Carotid artery occlusion  RICA 80-99%, LICA 40-59% by dopplers 06/2013. Also with cerebrovascular disease otherwise on MRA 06/2013.  . Macular degeneration     In both eyes - no longer drives.  . Arthritis   . Hiatal hernia   . Chronic kidney disease   . DVT (deep venous thrombosis)       a. Around age 34, details unclear (SVT vs DVT?), s/p vein stripping without recurrence.  . Anemia     a. "On/off my whole life" - etiology unclear. Has never required blood transfusion.  . Multiple thyroid nodules     a. By [place] 06/2013. Bx recommended.  Marland Kitchen PFO (patent foramen ovale)     a. Per echo in 12/2010.  Marland Kitchen PVD (peripheral vascular disease)     a. R innominant stenosis. b. 06/2013 ABI: 0.80 R, 0.50 L.  . Dizziness     a. Adm 06/2013: felt due to posterior circulation deficits from vertebrobasilar insufficiency and possibly vertigo.  Marland Kitchen Heart murmur     a. Longstanding. mild MR on prior ECG, also may be in setting of known vasc dz.      Most Recent Cardiac Studies: 2D Echo 06/30/13 - Left ventricle: The cavity size was normal. Wall thickness was normal. Systolic function was normal. The estimated ejection fraction was in the range of 60% to 65%. Wall motion was normal; there were no regional wall motion abnormalities. Doppler parameters are consistent with abnormal left ventricular relaxation (grade 1 diastolic dysfunction). - Right atrium: The atrium was mildly dilated.  No prior cath/nuc   Surgical History:  Past Surgical History  Procedure Laterality Date  . Cataract extraction    . Lumbar disc surgery    . Incontinence surgery    . Abdominal hysterectomy  1971  . Neck muscle surgery    . Varicose vein surgery      left leg  . Eye surgery    . Cholecystectomy  2004    Gall Bladder sling     Home Meds: Prior to Admission medications   Medication Sig Start Date End Date Taking? Authorizing Provider  amLODipine (NORVASC) 10 MG tablet Take 10 mg by mouth daily.   Yes Historical Provider, MD  aspirin 81 MG tablet Take 81 mg by mouth daily.     Yes Historical Provider, MD  atorvastatin (LIPITOR) 10 MG tablet Take 1 tablet (10 mg total) by mouth daily. 02/17/13  Yes Mary-Margaret Daphine Deutscher, FNP  Calcium Citrate (CALCITRATE PO) Take 1 tablet by mouth daily.    Yes  Historical Provider, MD  cholecalciferol (VITAMIN D) 1000 UNITS tablet Take 1,000 Units by mouth daily.   Yes Historical Provider, MD  cilostazol (PLETAL) 100 MG tablet Take 1 tablet (100 mg total) by mouth 2 (two) times daily before a meal. 07/10/13  Yes Nada Libman, MD  clopidogrel (PLAVIX) 75 MG tablet Take 1 tablet (75 mg total) by mouth daily with breakfast. 07/01/13  Yes Ripudeep K Rai, MD  hydrALAZINE (APRESOLINE) 25 MG tablet Take 1.5 tablets (37.5 mg total) by mouth 2 (two) times daily. 02/17/13  Yes Mary-Margaret Daphine Deutscher, FNP  ibuprofen (ADVIL,MOTRIN) 200 MG tablet Take 200 mg by mouth every 6 (six) hours as needed for mild pain.    Yes Historical Provider, MD  lisinopril (PRINIVIL,ZESTRIL) 40 MG tablet Take 1 tablet (40 mg total) by mouth daily. 02/17/13  Yes Mary-Margaret Daphine Deutscher, FNP  meclizine (ANTIVERT) 12.5 MG tablet Take 1 tablet (12.5 mg total) by mouth 2 (two) times daily as needed for dizziness (  also available over the counter). 07/01/13  Yes Ripudeep Jenna Luo, MD  Multiple Vitamins-Minerals (PRESERVISION AREDS) CAPS Take 1 capsule by mouth daily.    Yes Historical Provider, MD  senna-docusate (SENOKOT-S) 8.6-50 MG per tablet Take 1-2 tablets by mouth at bedtime as needed for mild constipation. 07/01/13  Yes Ripudeep Jenna Luo, MD    Inpatient Medications:  . aspirin  81 mg Oral Daily  . [START ON 07/15/2013] atorvastatin  40 mg Oral q1800  . carvedilol  3.125 mg Oral BID WC  . cholecalciferol  1,000 Units Oral Daily  . clopidogrel  75 mg Oral Q breakfast  . hydrALAZINE  37.5 mg Oral BID  . lisinopril  40 mg Oral Daily  . sodium chloride  3 mL Intravenous Q12H      Allergies:  Allergies  Allergen Reactions  . Penicillins Rash    Oral swelling  . Clonidine Derivatives     Slow HR/fatigue   . Maxzide [Hydrochlorothiazide W-Triamterene]     hyponatremia    History   Social History  . Marital Status: Widowed    Spouse Name: N/A    Number of Children: N/A  . Years of  Education: N/A   Occupational History  . Not on file.   Social History Main Topics  . Smoking status: Former Smoker    Quit date: 08/24/2009  . Smokeless tobacco: Never Used  . Alcohol Use: No  . Drug Use: No  . Sexual Activity: No   Other Topics Concern  . Not on file   Social History Narrative  . No narrative on file     Family History  Problem Relation Age of Onset  . Cancer Mother     [name]  . Diabetes Sister   . Cancer Sister   . Hyperlipidemia Sister   . Heart attack Sister   . Diabetes Son   . Hyperlipidemia Daughter      Review of Systems: General: negative for chills, fever  Cardiovascular: see above Dermatological: negative for rash Respiratory: negative for cough or wheezing Urologic: negative for hematuria Abdominal: negative for nausea, vomiting, diarrhea, known bright red blood per rectum, melena, or hematemesis but patient is vision impaired Neurologic: see above All other systems reviewed and are otherwise negative except as noted above.  Labs:  Recent Labs  07/14/13 0031 07/14/13 0545  TROPONINI <0.30 <0.30   Lab Results  Component Value Date   WBC 7.2 07/14/2013   HGB 8.5* 07/14/2013   HCT 24.9* 07/14/2013   MCV 87.1 07/14/2013   PLT 226 07/14/2013     Recent Labs Lab 07/14/13 0031  NA 137  K 3.5  CL 106  CO2 22  BUN 15  CREATININE 1.15*  CALCIUM 8.4  GLUCOSE 133*   Lab Results  Component Value Date   LDLCALC 62 02/17/2013   TRIG 65 02/17/2013   Radiology/Studies:  Ct Angio Head W/cm &/or Wo Cm  06/30/2013   CLINICAL DATA:  Right neck pain with dizziness and vertigo, no headache.  EXAM: CT ANGIOGRAPHY HEAD AND NECK  TECHNIQUE: Multidetector CT imaging of the head and neck was performed using the standard protocol during bolus administration of intravenous contrast. Multiplanar CT image reconstructions including MIPs were obtained to evaluate the vascular anatomy. Carotid stenosis measurements (when applicable) are  obtained utilizing NASCET criteria, using the distal internal carotid diameter as the denominator.  CONTRAST:  50mL OMNIPAQUE IOHEXOL 350 MG/ML SOLN  COMPARISON:  MRI and am are day of the head June 30, 2013 at 8:01 a.m., CT of the head June 29, 2013, carotid ultrasound April 05, 2007  FINDINGS: CTA HEAD FINDINGS  Non contrast imaging demonstrates normal ventricles and sulci for age. Multiple punctate calcifications at the gray-white matter junction. , without corresponding FLAIR/T2 abnormality, may reflect remote infectious process, less likely treated metastasis. No intraparenchymal hemorrhage, mass effect or midline shift. Patchy white matter hypodensities suggest sequelae of chronic small vessel ischemic disease, better characterized on MRI of the brain from same day, reported separately. No midline shift or mass effect. No acute large vascular territory infarct.  Basal cisterns are patent. Dense calcific atherosclerosis of the carotid siphons.  Anterior circulation: Bilateral features, cavernous carotid arteries are patent , there appears to be at least mild to moderate narrowing of the bilateral cavernous carotid arteries due to calcific atherosclerosis. Less than 50% narrowing of the right supra clinoid internal carotid artery. Left A1 segment is dominant, with normal appearance the anterior middle cerebral artery; the mild narrowing of the right anterior cerebral artery. Suggests on prior examination is not apparent on this study. Bilateral middle cerebral arteries are patent, including the more distal segments.  Posterior circulation: Right vertebral artery is dominant. Moderate calcific atherosclerosis of the left extra foraminal, extradural vertebral artery without apparent hemodynamically significant stenosis. At the at the vertebrobasilar junction. A 1 mm calcification is present, which appears to narrow the lumen by approximately 50%, corresponding to MRA abnormality. The remainder the basilar  artery is normal in course and caliber, no dissection. Right greater than left posterior communicating arteries are present. Normal appearance of posterior cerebral arteries.  No aneurysm, large vessel occlusion or is suspicious luminal irregularity.  Review of the MIP images confirms the above findings.  CTA NECK FINDINGS  Severe calcific atherosclerosis of the aortic arch, though mild motion degrades evaluation of the main branch vessels. However, there appears to be severe narrowing of the brachiocephalic artery, with the origin of the right common carotid artery is patent though, narrowed by least 50-75%, coronal 114/184. The origin of left common carotid artery appears severely narrowed, greater than 75%, coronal 116/184. Severe narrowing of the distal left common carotid artery, the lumen is narrowed to approximately 2 mm, from 7 mm.  Marked calcific atherosclerosis of the right greater than left carotid bifurcations, with severe narrowing, the origin of the right internal carotid artery is narrowed to approximately 1 mm, and measures 5 mm distally (approximately 80% stenosis). The origin of the left internal carotid artery is narrowed to 1.8 mm, the more distal left internal carotid artery is 5.3 mm (approximately 66% stenosis).  The distal cervical internal carotid artery segments are widely patent.  Severe narrowing of the origin of the left, moderate to severe in narrowing of the origin the right vertebral arteries. Enhancement of left vertebral artery, to the level the proximal intra foraminal segment where the left vertebral artery is no longer visualized. Within the more distal left vertebral artery foraminal segment, image 143/342 is occlusive calcification, above this level there is reconstitution left vertebral artery, by apparent muscular branches. Subsequent severe narrowing of the left vertebral artery, from the transition intraforaminal to extra foraminal segment.  The right vertebral artery  appears widely patent throughout all segments.  No pseudoaneurysm nor dissection.  Complex cystic and solid appearing 19 x 20 mm left thyroid nodule. Punctate calcifications of the right thyroid. Included view of the lung apices demonstrate severe centrilobular emphysema.  Aerodigestive tract is nonsuspicious. Normal appearance of major salivary glands. Degenerative  changes cervical spine with grade 1 C4-5 anterolisthesis, on spondylotic basis. Moderate to severe right C3-4 neural foraminal narrowing, severe right C5-6 neural foraminal narrowing.  Review of the MIP images confirms the above findings.  IMPRESSION: CTA head: Focal calcification though vertebrobasilar junction narrows the lumen by approximately 50%, no evidence of dissection. Generalized intracranial atherosclerosis, resulting in apparent less than 50% narrowing of the right supra clinoid internal carotid artery.  CTA neck: Severe stenosis of the right internal carotid artery by NASCET criteria (estimated at 50- 69% on carotid ultrasound April 05, 2007) and moderate stenosis of the left internal carotid artery origin by NASCET criteria.  Severe calcific atherosclerosis of the aortic arch, resulting in appearance severe stenosis of the origin of the brachiocephalic artery and left common carotid artery. In addition, severely narrowed mid left common carotid artery due to calcific atherosclerosis.  Severe narrowing of the origin left vertebral artery, with exclusion of the proximal intraforaminal left vertebral artery, due to occlusive calcification, with reconstitution distally due to apparent muscular branches. Subsequent severe narrowing of the left vertebral artery from the transition of the intra foraminal extra foraminal segment.  19 x 20 mm complex cystic and solid left thyroid nodule would be better characterized on dedicated thyroid sonogram as clinically indicated.   Electronically Signed   By: Awilda Metro   On: 06/30/2013 17:42   Ct  Head Wo Contrast  07/14/2013   CLINICAL DATA:  Arm pain  EXAM: CT HEAD WITHOUT CONTRAST  TECHNIQUE: Contiguous axial images were obtained from the base of the skull through the vertex without intravenous contrast.  COMPARISON:  06/30/2013  FINDINGS: Skull and Sinuses:No significant abnormality.  Orbits: No acute abnormality.  Brain: No evidence of acute abnormality, such as acute infarction, hemorrhage, hydrocephalus, or mass lesion/mass effect. Unchanged pattern of patchy bilateral cerebral white matter low density, consistent with chronic small vessel ischemia. No change in multi focal brain surface calcification, likely from previous granulomatous infection.  IMPRESSION: 1. No evidence of acute intracranial disease. 2. Senescent changes including chronic small vessel ischemia.   Electronically Signed   By: Tiburcio Pea M.D.   On: 07/14/2013 03:26   Ct Head Wo Contrast  06/29/2013   CLINICAL DATA:  Dizziness, seeing black spots  EXAM: CT HEAD WITHOUT CONTRAST  TECHNIQUE: Contiguous axial images were obtained from the base of the skull through the vertex without intravenous contrast.  COMPARISON:  None.  FINDINGS: No skull fracture is noted. No intracranial hemorrhage, mass effect or midline shift. Paranasal sinuses and mastoid air cells are unremarkable. Bilateral parenchymal punctate calcification may represent sequela from prior infection. Mild cerebral atrophy. No acute cortical infarction. No mass lesion is noted on this unenhanced scan.  IMPRESSION: No acute intracranial abnormality. Mild cerebral atrophy.   Electronically Signed   By: Natasha Mead M.D.   On: 06/29/2013 08:33   Ct Angio Neck W/cm &/or Wo/cm  06/30/2013   CLINICAL DATA:  Right neck pain with dizziness and vertigo, no headache.  EXAM: CT ANGIOGRAPHY HEAD AND NECK  TECHNIQUE: Multidetector CT imaging of the head and neck was performed using the standard protocol during bolus administration of intravenous contrast. Multiplanar CT image  reconstructions including MIPs were obtained to evaluate the vascular anatomy. Carotid stenosis measurements (when applicable) are obtained utilizing NASCET criteria, using the distal internal carotid diameter as the denominator.  CONTRAST:  50mL OMNIPAQUE IOHEXOL 350 MG/ML SOLN  COMPARISON:  MRI and am are day of the head June 30, 2013 at 8:01  a.m., CT of the head June 29, 2013, carotid ultrasound April 05, 2007  FINDINGS: CTA HEAD FINDINGS  Non contrast imaging demonstrates normal ventricles and sulci for age. Multiple punctate calcifications at the gray-white matter junction. , without corresponding FLAIR/T2 abnormality, may reflect remote infectious process, less likely treated metastasis. No intraparenchymal hemorrhage, mass effect or midline shift. Patchy white matter hypodensities suggest sequelae of chronic small vessel ischemic disease, better characterized on MRI of the brain from same day, reported separately. No midline shift or mass effect. No acute large vascular territory infarct.  Basal cisterns are patent. Dense calcific atherosclerosis of the carotid siphons.  Anterior circulation: Bilateral features, cavernous carotid arteries are patent , there appears to be at least mild to moderate narrowing of the bilateral cavernous carotid arteries due to calcific atherosclerosis. Less than 50% narrowing of the right supra clinoid internal carotid artery. Left A1 segment is dominant, with normal appearance the anterior middle cerebral artery; the mild narrowing of the right anterior cerebral artery. Suggests on prior examination is not apparent on this study. Bilateral middle cerebral arteries are patent, including the more distal segments.  Posterior circulation: Right vertebral artery is dominant. Moderate calcific atherosclerosis of the left extra foraminal, extradural vertebral artery without apparent hemodynamically significant stenosis. At the at the vertebrobasilar junction. A 1 mm  calcification is present, which appears to narrow the lumen by approximately 50%, corresponding to MRA abnormality. The remainder the basilar artery is normal in course and caliber, no dissection. Right greater than left posterior communicating arteries are present. Normal appearance of posterior cerebral arteries.  No aneurysm, large vessel occlusion or is suspicious luminal irregularity.  Review of the MIP images confirms the above findings.  CTA NECK FINDINGS  Severe calcific atherosclerosis of the aortic arch, though mild motion degrades evaluation of the main branch vessels. However, there appears to be severe narrowing of the brachiocephalic artery, with the origin of the right common carotid artery is patent though, narrowed by least 50-75%, coronal 114/184. The origin of left common carotid artery appears severely narrowed, greater than 75%, coronal 116/184. Severe narrowing of the distal left common carotid artery, the lumen is narrowed to approximately 2 mm, from 7 mm.  Marked calcific atherosclerosis of the right greater than left carotid bifurcations, with severe narrowing, the origin of the right internal carotid artery is narrowed to approximately 1 mm, and measures 5 mm distally (approximately 80% stenosis). The origin of the left internal carotid artery is narrowed to 1.8 mm, the more distal left internal carotid artery is 5.3 mm (approximately 66% stenosis).  The distal cervical internal carotid artery segments are widely patent.  Severe narrowing of the origin of the left, moderate to severe in narrowing of the origin the right vertebral arteries. Enhancement of left vertebral artery, to the level the proximal intra foraminal segment where the left vertebral artery is no longer visualized. Within the more distal left vertebral artery foraminal segment, image 143/342 is occlusive calcification, above this level there is reconstitution left vertebral artery, by apparent muscular branches. Subsequent  severe narrowing of the left vertebral artery, from the transition intraforaminal to extra foraminal segment.  The right vertebral artery appears widely patent throughout all segments.  No pseudoaneurysm nor dissection.  Complex cystic and solid appearing 19 x 20 mm left thyroid nodule. Punctate calcifications of the right thyroid. Included view of the lung apices demonstrate severe centrilobular emphysema.  Aerodigestive tract is nonsuspicious. Normal appearance of major salivary glands. Degenerative changes cervical spine with  grade 1 C4-5 anterolisthesis, on spondylotic basis. Moderate to severe right C3-4 neural foraminal narrowing, severe right C5-6 neural foraminal narrowing.  Review of the MIP images confirms the above findings.  IMPRESSION: CTA head: Focal calcification though vertebrobasilar junction narrows the lumen by approximately 50%, no evidence of dissection. Generalized intracranial atherosclerosis, resulting in apparent less than 50% narrowing of the right supra clinoid internal carotid artery.  CTA neck: Severe stenosis of the right internal carotid artery by NASCET criteria (estimated at 50- 69% on carotid ultrasound April 05, 2007) and moderate stenosis of the left internal carotid artery origin by NASCET criteria.  Severe calcific atherosclerosis of the aortic arch, resulting in appearance severe stenosis of the origin of the brachiocephalic artery and left common carotid artery. In addition, severely narrowed mid left common carotid artery due to calcific atherosclerosis.  Severe narrowing of the origin left vertebral artery, with exclusion of the proximal intraforaminal left vertebral artery, due to occlusive calcification, with reconstitution distally due to apparent muscular branches. Subsequent severe narrowing of the left vertebral artery from the transition of the intra foraminal extra foraminal segment.  19 x 20 mm complex cystic and solid left thyroid nodule would be better  characterized on dedicated thyroid sonogram as clinically indicated.   Electronically Signed   By: Awilda Metro   On: 06/30/2013 17:42   Ct Cervical Spine Wo Contrast  06/30/2013   CLINICAL DATA:  Right-sided neck pain and dizziness.  EXAM: CT CERVICAL SPINE WITHOUT CONTRAST  TECHNIQUE: Multidetector CT imaging of the cervical spine was performed without intravenous contrast. Multiplanar CT image reconstructions were also generated.  COMPARISON:  None.  FINDINGS: The sagittal reformatted images from the CT angiogram of the head neck demonstrates moderate degenerative cervical spondylosis with multilevel disc disease and facet disease. There are fused facets and partial interbody fusion C3-4. No acute fractures identified. No destructive bony changes or worrisome bone lesion. The skullbase C1 and C1-2 articulations are maintained. There are moderate degenerative changes at C1-2. The dens is intact.  No large disc protrusions or significant spinal stenosis. The spinal canal is generous. There is mild right foraminal stenosis at C5-6 due to uncinate spurring. Mild bilateral foraminal stenosis at C6-7 due to uncinate spurring.  The lung apices are clear. Emphysematous changes are noted. The thyroid gland is enlarged and there is a mixed solid and cystic lesion in the left thyroid lobe. Recommend ultrasound correlation and potential biopsy.  IMPRESSION: Degenerative cervical spondylosis with multilevel disc disease and facet disease.  No acute bony findings.  Mixed cystic and solid left thyroid lesion. Recommend ultrasound correlation and possible biopsy.   Electronically Signed   By: Loralie Champagne M.D.   On: 06/30/2013 18:15   Mr Maxine Glenn Head Wo Contrast  06/30/2013   CLINICAL DATA:  Dizziness. History of carotid artery stenosis. Stroke risk factors include hypertension, and hyperlipidemia.  EXAM: MRI HEAD WITHOUT CONTRAST  MRA HEAD WITHOUT CONTRAST  TECHNIQUE: Multiplanar, multiecho pulse sequences of the  brain and surrounding structures were obtained without intravenous contrast. Angiographic images of the head were obtained using MRA technique without contrast.  COMPARISON:  CT head 06/29/2013 is unremarkable. Carotid Dopplers from 04/05/2007 revealed bilateral carotid artery stenosis not clearly flow reducing.  FINDINGS: MRI HEAD FINDINGS  No evidence for acute infarction, hemorrhage, mass lesion, hydrocephalus, or extra-axial fluid. Mild to moderate age-related cerebral and cerebellar atrophy. Moderate small vessel type changes throughout the periventricular and subcortical white matter. No large vessel or significant lacunar infarct.  Flow voids are maintained throughout the carotid, basilar, and vertebral arteries. There are no areas of chronic hemorrhage. Possible subcentimeter T2 bright lesion right pituitary gland likely incidental cyst. No elevation of the pituitary contour or, cavernous sinus, or suprasellar mass lesion. Normal cerebellar tonsils and upper cervical region  Visualized calvarium, skull base, and upper cervical osseous structures unremarkable. Scalp and extracranial soft tissues, orbits, sinuses, and mastoids show no acute process.  MRA HEAD FINDINGS  Mild non stenotic irregularity both internal carotid arteries. Slight medial outpouching left cavernous ICA could be atheromatous aneurysmal dilatation. No visible intracranial berry aneurysm.  Moderate narrowing of the proximal anterior cerebral artery on the right unlikely to be clinically significant with a normal and dominant left A1 ACA. No proximal MCA stenosis. Intact circle of Willis as with bilateral patent posterior communicating arteries.  Mild to moderate irregularity of the proximal basilar artery. Question focal stenosis versus asymmetric sensation versus localized dissection. Right vertebral is the dominant contributor with left vertebral smaller but patent. No PCA stenosis. No cerebellar branch occlusion.  IMPRESSION: No acute  intracranial abnormality. Mild to moderate age-related atrophy and chronic microvascular ischemic change.  Possible subcentimeter lesion within the right pituitary gland, likely incidental cyst.  Mild to moderate irregularity of the proximal basilar artery; question focal stenosis versus asymmetric fenestration versus localized dissection. Consider CTA head neck for further evaluation.   Electronically Signed   By: Davonna Belling M.D.   On: 06/30/2013 09:55   Mr Brain Wo Contrast  06/30/2013   CLINICAL DATA:  Dizziness. History of carotid artery stenosis. Stroke risk factors include hypertension, and hyperlipidemia.  EXAM: MRI HEAD WITHOUT CONTRAST  MRA HEAD WITHOUT CONTRAST  TECHNIQUE: Multiplanar, multiecho pulse sequences of the brain and surrounding structures were obtained without intravenous contrast. Angiographic images of the head were obtained using MRA technique without contrast.  COMPARISON:  CT head 06/29/2013 is unremarkable. Carotid Dopplers from 04/05/2007 revealed bilateral carotid artery stenosis not clearly flow reducing.  FINDINGS: MRI HEAD FINDINGS  No evidence for acute infarction, hemorrhage, mass lesion, hydrocephalus, or extra-axial fluid. Mild to moderate age-related cerebral and cerebellar atrophy. Moderate small vessel type changes throughout the periventricular and subcortical white matter. No large vessel or significant lacunar infarct.  Flow voids are maintained throughout the carotid, basilar, and vertebral arteries. There are no areas of chronic hemorrhage. Possible subcentimeter T2 bright lesion right pituitary gland likely incidental cyst. No elevation of the pituitary contour or, cavernous sinus, or suprasellar mass lesion. Normal cerebellar tonsils and upper cervical region  Visualized calvarium, skull base, and upper cervical osseous structures unremarkable. Scalp and extracranial soft tissues, orbits, sinuses, and mastoids show no acute process.  MRA HEAD FINDINGS  Mild non  stenotic irregularity both internal carotid arteries. Slight medial outpouching left cavernous ICA could be atheromatous aneurysmal dilatation. No visible intracranial berry aneurysm.  Moderate narrowing of the proximal anterior cerebral artery on the right unlikely to be clinically significant with a normal and dominant left A1 ACA. No proximal MCA stenosis. Intact circle of Willis as with bilateral patent posterior communicating arteries.  Mild to moderate irregularity of the proximal basilar artery. Question focal stenosis versus asymmetric sensation versus localized dissection. Right vertebral is the dominant contributor with left vertebral smaller but patent. No PCA stenosis. No cerebellar branch occlusion.  IMPRESSION: No acute intracranial abnormality. Mild to moderate age-related atrophy and chronic microvascular ischemic change.  Possible subcentimeter lesion within the right pituitary gland, likely incidental cyst.  Mild to moderate irregularity of the  proximal basilar artery; question focal stenosis versus asymmetric fenestration versus localized dissection. Consider CTA head neck for further evaluation.   Electronically Signed   By: Davonna Belling M.D.   On: 06/30/2013 09:55   US Soft Tissue Head/neck  07/01/2013   CLINICAL DATA:  Right thyroid lesion  EXAM: THYROID ULTRASOUND  TECHNIQUE: Ultrasound examination of the thyroid gland and adjacent soft tissues was performed.  COMPARISON:  None.  FINDINGS: Right thyroid lobe  Measurements: 4.9 x 2.4 x 1.4 cm. Multiple small thyroid nodules including a 6 mm solid nodule in the right lower gland. 4 mm calcification in the right mid gland.  Left thyroid lobe  Measurements: 4.8 x 2.0 x 2.8 cm. Dominant nodules include a 13 x 9 x 14 mm solid nodule in the left mid gland, a hypervascular 2.9 x 1.7 x 1.9 cm mixed cystic/solid nodule in the left mid gland, and a 12 x 10 x 10 mm solid nodule in the left lower gland. Additional smaller thyroid nodules.  Isthmus   Thickness: 7 mm in thickness.  No nodules visualized.  Lymphadenopathy  None visualized.  IMPRESSION: Multiple bilateral thyroid nodules, including a dominant 2.9 cm mixed cystic/nodule in the right mid gland.  Ultrasound-guided biopsy should be considered on outpatient basis.  This recommendation is as per the consensus statement: Management of Thyroid Nodules Detected at [place]: Society of Radiologists in Ultrasound Consensus Conference Statement. Radiology 2005; [id number].   Electronically Signed   By: Charline Bills M.D.   On: 07/01/2013 11:44   Dg Chest Port 1 View  07/14/2013   CLINICAL DATA:  Left chest pain.  EXAM: PORTABLE CHEST - 1 VIEW  COMPARISON:  Multiple exams, including 10/11/2012  FINDINGS: Emphysema. Atherosclerosis. Heart size within normal limits. The lungs appear otherwise clear. No pleural effusion.  IMPRESSION: 1. No acute findings. 2. Emphysema. 3. Atherosclerosis.   Electronically Signed   By: Herbie Baltimore M.D.   On: 07/14/2013 01:13    EKG:  07/13/13 - sinus tach with atrial bigeminy. ST depression present II, III, avF, V3-V6, TWI 1, V2, mildly elevated ST in avL  Has h/o abnormal ECG with TWI I, II, III, avF, V3-V5 but this ECG appears different  Physical Exam: Blood pressure 152/49, pulse 68, temperature 98 F (36.7 C), temperature source Oral, resp. rate 20, height 5\' 7"  (1.702 m), weight 127 lb 6.4 oz (57.788 kg), SpO2 96.00%. General: Well developed, well nourished thin WF in no acute distress. Head: Normocephalic, atraumatic, sclera non-icteric, no xanthomas, nares are without discharge.  Neck: Bilateral carotid bruits L>R. JVD not elevated. Lungs: Clear bilaterally to auscultation without wheezes, rales, or rhonchi. Breathing is unlabored. Heart: RRR with S1 S2. Loud systolic murmur over central chest. No rubs or gallops appreciated. Abdomen: Soft, non-tender, non-distended with normoactive bowel sounds. No rebound/guarding. +faint abdominal bruit Msk:  Thin  build. Extremities: No clubbing or cyanosis. No edema.  Distal pedal pulses diminished. + Femoral bruits L>>R Neuro: Alert and oriented X 3. No facial asymmetry. No focal deficit. Moves all extremities spontaneously. Psych:  Responds to questions appropriately with a normal affect.   Assessment and Plan:   1. Palpitations likely due to sinus tach with atrial bigeminy 2. L arm burning 3. Significant PVD with carotid disease, innominant disease, recent diagnosed LE PVD started on Pletal 4. Recent dizziness felt due to posterior circulation cerebrovascular disease 5. HTN 6. Hyperlipidemia 7. Anemia, unclear etiology 8. Recently diagnosed thyroid nodules, has f/u PCP in December 9. 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 Suspect significant underlying coronary disease given her extensive PVD, exacerbated by elevated HR and anemia. Will discuss further workup with MD. Continue ASA, agree with Coreg substitution for Norvasc. Will titrate statin. Would also consider abdominal US given abdominal bruit. Hemoccult stools, d/c ibuprofen - further workup of anemia per IM. Agree with discontinuation of Pletal particularly given DAPT & anemia. Leg claudication occurs primarily only with inclines/steps.  Neuro eval also underway given component of L hand weakness during event.  Signed, Dayna Dunn PA-C 07/14/2013, 10:19 AM    History and all data above reviewed.  Patient examined.  I agree with the findings as above.  She describes palpitations (pounding heart beat) and left arm and hand burning.  She only had this after taking Pletal.  She reported some weakness in the left hand.  Enzymes have been negative and EKG had no acute ST T wave changes.  She did have atrial ectopy with atrial bigeminy. The patient exam reveals COR:RRR  ,  Lungs: Clear  ,  Abd: Positive bowel sounds, no rebound no guarding, Ext No edema.  All available labs, radiology testing,  previous records reviewed. Agree with documented assessment and plan. Arm pain:  Typical and atypical features.  I will start with a Lexiscan Myoview to evaluate.  She can have this tomorrow.  Palpitations:  She will need an outpatient event monitor.    Fayrene Fearing Juwana Thoreson  11:18 AM  07/14/2013

## 2013-07-14 NOTE — Consult Note (Signed)
NEURO HOSPITALIST CONSULT NOTE    Reason for Consult: left arm-hand burning and weakness.  HPI:                                                                                                                                          [name] is an 79 y.o. female with a past medical history significant for HTN, hyperlipidemia, CAD, bilateral carotid stenosis, macular degeneration, admitted to [name] due to the above stated symptoms.  She was recently discharged from the hospital due to vertigo. Mrs. Laser indicated that yesterday she started a new heart medication and after taking the first dose developed " a pounding sensation in my chest and neck, then the left arm started burning, pain, and he left hand got weak". The symptoms lasted for few hours and then went away. She denies associated HA, vertigo, double vision, imbalance, slurred speech, language or vision impairment. CT head negative for acute abnormality.  Past Medical History  Diagnosis Date  . Hypertension     intolerant to Maxzide due to hyponatremia, intolerant to Clonidine due to slow HR/fatigue  . Hyperlipidemia   . Carotid artery occlusion   . Macular degeneration     In both eyes  . Arthritis   . Hiatal hernia   . Chronic kidney disease   . DVT (deep venous thrombosis)   . Anemia   . Irregular heartbeat   . Family history of anesthesia complication     daughter has nausea  . Coronary artery disease     Past Surgical History  Procedure Laterality Date  . Cataract extraction    . Lumbar disc surgery    . Incontinence surgery    . Abdominal hysterectomy  1971  . Neck muscle surgery    . Varicose vein surgery      left leg  . Eye surgery    . Cholecystectomy  2004    Gall Bladder sling    Family History  Problem Relation Age of Onset  . Cancer Mother     [name]  . Diabetes Sister   . Cancer Sister   . Hyperlipidemia Sister   . Heart attack Sister   . Diabetes Son   .  Hyperlipidemia Daughter      Social History:  reports that she quit smoking about 3 years ago. She has never used smokeless tobacco. She reports that she does not drink alcohol or use illicit drugs.  Allergies  Allergen Reactions  . Penicillins Rash    Oral swelling    MEDICATIONS:  I have reviewed the patient's current medications.   ROS:                                                                                                                                       History obtained from the patient and family.  General ROS: negative for - chills, fatigue, fever, night sweats, weight gain or weight loss Psychological ROS: negative for - behavioral disorder, hallucinations, memory difficulties, mood swings or suicidal ideation Ophthalmic ROS: negative for - double vision, eye pain ENT ROS: negative for - epistaxis, nasal discharge, oral lesions, sore throat, tinnitus or vertigo Allergy and Immunology ROS: negative for - hives or itchy/watery eyes Hematological and Lymphatic ROS: negative for - bleeding problems, bruising or swollen lymph nodes Endocrine ROS: negative for - galactorrhea, hair pattern changes, polydipsia/polyuria or temperature intolerance Respiratory ROS: negative for - cough, hemoptysis, shortness of breath or wheezing Cardiovascular ROS: negative for - chest pain, dyspnea on exertion, edema or irregular heartbeat Gastrointestinal ROS: negative for - abdominal pain, diarrhea, hematemesis, nausea/vomiting or stool incontinence Genito-Urinary ROS: negative for - dysuria, hematuria, incontinence or urinary frequency/urgency Musculoskeletal ROS: negative for - joint swelling or muscular weakness Neurological ROS: as noted in HPI Dermatological ROS: negative for rash and skin lesion changes   Physical exam: pleasant female in no apparent distress. Blood  pressure 152/49, pulse 68, temperature 98 F (36.7 C), temperature source Oral, resp. rate 20, height 5\' 7"  (1.702 m), weight 57.788 kg (127 lb 6.4 oz), SpO2 96.00%. Head: normocephalic. Neck: supple, no bruits, no JVD. Cardiac: no murmurs. Lungs: clear. Abdomen: soft, no tender, no mass. Extremities: no edema.   Neurologic Examination:                                                                                                      Mental Status: Alert, oriented, thought content appropriate.  Speech fluent without evidence of aphasia.  Able to follow 3 step commands without difficulty. Cranial Nerves: II: Discs flat bilaterally; Visual fields grossly normal, pupils equal, round, reactive to light and accommodation III,IV, VI: ptosis not present, extra-ocular motions intact bilaterally V,VII: smile symmetric, facial light touch sensation normal bilaterally VIII: hearing normal bilaterally IX,X: gag reflex present XI: bilateral shoulder shrug XII: midline tongue extension without atrophy or fasciculations  Motor: Right : Upper extremity   5/5    Left:     Upper extremity   5/5  Lower extremity   5/5     Lower extremity  5/5 Tone and bulk:normal tone throughout; no atrophy noted Sensory: Pinprick and light touch intact throughout, bilaterally Deep Tendon Reflexes:  Right: Upper Extremity   Left: Upper extremity   biceps (C-5 to C-6) 2/4   biceps (C-5 to C-6) 2/4 tricep (C7) 2/4    triceps (C7) 2/4 Brachioradialis (C6) 2/4  Brachioradialis (C6) 2/4  Lower Extremity Lower Extremity  quadriceps (L-2 to L-4) 2/4   quadriceps (L-2 to L-4) 2/4 Achilles (S1) 2/4   Achilles (S1) 2/4  Plantars: Right: downgoing   Left: downgoing Cerebellar: normal finger-to-nose,  normal heel-to-shin test Gait:  No tested CV: pulses palpable throughout    No results found for this basename: cbc, bmp, coags, chol, tri, ldl, hga1c    Results for orders placed during the hospital encounter of  07/13/13 (from the past 48 hour(s))  CBC WITH DIFFERENTIAL     Status: Abnormal   Collection Time    07/14/13 12:31 AM      Result Value Range   WBC 7.2  4.0 - 10.5 K/uL   RBC 2.86 (*) 3.87 - 5.11 MIL/uL   Hemoglobin 8.5 (*) 12.0 - 15.0 g/dL   HCT 16.1 (*) 09.6 - 04.5 %   MCV 87.1  78.0 - 100.0 fL   MCH 29.7  26.0 - 34.0 pg   MCHC 34.1  30.0 - 36.0 g/dL   RDW 40.9  81.1 - 91.4 %   Platelets 226  150 - 400 K/uL   Neutrophils Relative % 76  43 - 77 %   Neutro Abs 5.4  1.7 - 7.7 K/uL   Lymphocytes Relative 12  12 - 46 %   Lymphs Abs 0.9  0.7 - 4.0 K/uL   Monocytes Relative 10  3 - 12 %   Monocytes Absolute 0.7  0.1 - 1.0 K/uL   Eosinophils Relative 1  0 - 5 %   Eosinophils Absolute 0.1  0.0 - 0.7 K/uL   Basophils Relative 1  0 - 1 %   Basophils Absolute 0.1  0.0 - 0.1 K/uL  BASIC METABOLIC PANEL     Status: Abnormal   Collection Time    07/14/13 12:31 AM      Result Value Range   Sodium 137  135 - 145 mEq/L   Potassium 3.5  3.5 - 5.1 mEq/L   Chloride 106  96 - 112 mEq/L   CO2 22  19 - 32 mEq/L   Glucose, Bld 133 (*) 70 - 99 mg/dL   BUN 15  6 - 23 mg/dL   Creatinine, Ser 7.82 (*) 0.50 - 1.10 mg/dL   Calcium 8.4  8.4 - 95.6 mg/dL   GFR calc non Af Amer 44 (*) >90 mL/min   GFR calc Af Amer 51 (*) >90 mL/min   Comment: (NOTE)     The eGFR has been calculated using the CKD EPI equation.     This calculation has not been validated in all clinical situations.     eGFR's persistently <90 mL/min signify possible Chronic Kidney     Disease.  TROPONIN I     Status: None   Collection Time    07/14/13 12:31 AM      Result Value Range   Troponin I <0.30  <0.30 ng/mL   Comment:            Due to the release kinetics of cTnI,     a negative result within the first hours     of the onset of  symptoms does not rule out     myocardial infarction with certainty.     If myocardial infarction is still suspected,     repeat the test at appropriate intervals.  TROPONIN I     Status: None    Collection Time    07/14/13  5:45 AM      Result Value Range   Troponin I <0.30  <0.30 ng/mL   Comment:            Due to the release kinetics of cTnI,     a negative result within the first hours     of the onset of symptoms does not rule out     myocardial infarction with certainty.     If myocardial infarction is still suspected,     repeat the test at appropriate intervals.    Ct Head Wo Contrast  07/14/2013   CLINICAL DATA:  Arm pain  EXAM: CT HEAD WITHOUT CONTRAST  TECHNIQUE: Contiguous axial images were obtained from the base of the skull through the vertex without intravenous contrast.  COMPARISON:  06/30/2013  FINDINGS: Skull and Sinuses:No significant abnormality.  Orbits: No acute abnormality.  Brain: No evidence of acute abnormality, such as acute infarction, hemorrhage, hydrocephalus, or mass lesion/mass effect. Unchanged pattern of patchy bilateral cerebral white matter low density, consistent with chronic small vessel ischemia. No change in multi focal brain surface calcification, likely from previous granulomatous infection.  IMPRESSION: 1. No evidence of acute intracranial disease. 2. Senescent changes including chronic small vessel ischemia.   Electronically Signed   By: Tiburcio Pea M.D.   On: 07/14/2013 03:26   Dg Chest Port 1 View  07/14/2013   CLINICAL DATA:  Left chest pain.  EXAM: PORTABLE CHEST - 1 VIEW  COMPARISON:  Multiple exams, including 10/11/2012  FINDINGS: Emphysema. Atherosclerosis. Heart size within normal limits. The lungs appear otherwise clear. No pleural effusion.  IMPRESSION: 1. No acute findings. 2. Emphysema. 3. Atherosclerosis.   Electronically Signed   By: Herbie Baltimore M.D.   On: 07/14/2013 01:13        Assessment/Plan: 79 y/o with multiple risk factors for stroke, comes in after sustaining a transient episode of left arm dysesthesias and weakness left hand that lasted for few hours. This occurred in association with a " pounding  sensation" in her chest and neck. Neuro-exam non focal and CT brain unimpressive. Although TIA is a possible explanation, the concomitant chest-neck symptoms are not quite typical for a TIA presentation. A peripheral radicular/neuropathic process is also unlikely. Will request MRI brain and then make further decisions accordingly.  Wyatt Portela, MD 07/14/2013, 9:06 AM

## 2013-07-14 NOTE — ED Provider Notes (Signed)
CSN: [id number]     Arrival date & time 07/13/13  2325 History   First MD Initiated Contact with Patient 07/13/13 2325     Chief Complaint  Patient presents with  . Arm Pain   (Consider location/radiation/quality/duration/timing/severity/associated sxs/prior Treatment) HPI Comments: History of carotid artery stenosis, hypertension, hyperlipidemia comes in with cc of arm pain.  Pt was started pletal today, and states that within seconds of her taking her morning dose, she started having palpitations and burning pain in her left upper extremity. Her sx lasted for close to 3 hours. She also was having some dexterity issues with the sx. Her sx resolved, but recurred after the pm dose, and lasted for few minutes that time. No numbness, tingling, weakness. She has carotid stenosis, but no hx of stroke, MI. No hx of chest pain leading upto this event. She felt that with the palpitations, she might pass out.  Patient is a 79 y.o. female presenting with arm pain. The history is provided by the patient.  Arm Pain Pertinent negatives include no chest pain, no abdominal pain and no shortness of breath.    Past Medical History  Diagnosis Date  . Hypertension     intolerant to Maxzide due to hyponatremia, intolerant to Clonidine due to slow HR/fatigue  . Hyperlipidemia   . Carotid artery occlusion   . Macular degeneration     In both eyes  . Arthritis   . Hiatal hernia   . Chronic kidney disease   . DVT (deep venous thrombosis)   . Anemia   . Irregular heartbeat   . Family history of anesthesia complication     daughter has nausea  . Coronary artery disease    Past Surgical History  Procedure Laterality Date  . Cataract extraction    . Lumbar disc surgery    . Incontinence surgery    . Abdominal hysterectomy  1971  . Neck muscle surgery    . Varicose vein surgery      left leg  . Eye surgery    . Cholecystectomy  2004    Gall Bladder sling   Family History  Problem Relation Age of  Onset  . Cancer Mother     [name]  . Diabetes Sister   . Cancer Sister   . Hyperlipidemia Sister   . Heart attack Sister   . Diabetes Son   . Hyperlipidemia Daughter    History  Substance Use Topics  . Smoking status: Former Smoker    Quit date: 08/24/2009  . Smokeless tobacco: Never Used  . Alcohol Use: No   OB History   Grav Para Term Preterm Abortions TAB SAB Ect Mult Living                 Review of Systems  Constitutional: Negative for activity change.  HENT: Negative for facial swelling.   Respiratory: Negative for cough, shortness of breath and wheezing.   Cardiovascular: Negative for chest pain.  Gastrointestinal: Negative for nausea, vomiting, abdominal pain, diarrhea, constipation, blood in stool and abdominal distention.  Genitourinary: Negative for hematuria and difficulty urinating.  Musculoskeletal: Negative for neck pain.  Skin: Negative for color change.  Neurological: Positive for dizziness and light-headedness. Negative for speech difficulty.  Hematological: Does not bruise/bleed easily.  Psychiatric/Behavioral: Negative for confusion.    Allergies  Penicillins  Home Medications   Current Outpatient Rx  Name  Route  Sig  Dispense  Refill  . amLODipine (NORVASC) 10 MG tablet   Oral  Take 10 mg by mouth daily.         Marland Kitchen aspirin 81 MG tablet   Oral   Take 81 mg by mouth daily.           Marland Kitchen atorvastatin (LIPITOR) 10 MG tablet   Oral   Take 1 tablet (10 mg total) by mouth daily.   30 tablet   5   . Calcium Citrate (CALCITRATE PO)   Oral   Take 1 tablet by mouth daily.          . cholecalciferol (VITAMIN D) 1000 UNITS tablet   Oral   Take 1,000 Units by mouth daily.         . cilostazol (PLETAL) 100 MG tablet   Oral   Take 1 tablet (100 mg total) by mouth 2 (two) times daily before a meal.   60 tablet   6   . clopidogrel (PLAVIX) 75 MG tablet   Oral   Take 1 tablet (75 mg total) by mouth daily with breakfast.   30 tablet    5   . hydrALAZINE (APRESOLINE) 25 MG tablet   Oral   Take 1.5 tablets (37.5 mg total) by mouth 2 (two) times daily.   135 tablet   3   . ibuprofen (ADVIL,MOTRIN) 200 MG tablet   Oral   Take 200 mg by mouth every 6 (six) hours as needed for mild pain.          Marland Kitchen lisinopril (PRINIVIL,ZESTRIL) 40 MG tablet   Oral   Take 1 tablet (40 mg total) by mouth daily.   30 tablet   5   . meclizine (ANTIVERT) 12.5 MG tablet   Oral   Take 1 tablet (12.5 mg total) by mouth 2 (two) times daily as needed for dizziness (also available over the counter).   60 tablet   3   . Multiple Vitamins-Minerals (PRESERVISION AREDS) CAPS   Oral   Take 1 capsule by mouth daily.          Marland Kitchen senna-docusate (SENOKOT-S) 8.6-50 MG per tablet   Oral   Take 1-2 tablets by mouth at bedtime as needed for mild constipation.   60 tablet   3    BP 121/35  Pulse 94  Temp(Src) 97.2 F (36.2 C) (Oral)  Resp 22  SpO2 96% Physical Exam  Nursing note and vitals reviewed. Constitutional: She is oriented to person, place, and time. She appears well-developed and well-nourished.  HENT:  Head: Normocephalic and atraumatic.  Eyes: EOM are normal. Pupils are equal, round, and reactive to light.  Neck: Neck supple.  Cardiovascular: Normal rate, regular rhythm and normal heart sounds.   No murmur heard. Pulmonary/Chest: Effort normal. No respiratory distress.  Abdominal: Soft. She exhibits no distension. There is no tenderness. There is no rebound and no guarding.  Neurological: She is alert and oriented to person, place, and time. No cranial nerve deficit. Coordination normal.  Cerebellar exam is normal (finger to nose) Sensory exam normal for bilateral upper and lower extremities - and patient is able to discriminate between sharp and dull. Motor exam is 4+/5 - grip strength normal.    Skin: Skin is warm and dry.    ED Course  Procedures (including critical care time) Labs Review Labs Reviewed  CBC WITH  DIFFERENTIAL - Abnormal; Notable for the following:    RBC 2.86 (*)    Hemoglobin 8.5 (*)    HCT 24.9 (*)    All other components within  normal limits  BASIC METABOLIC PANEL  TROPONIN I  URINALYSIS, ROUTINE W REFLEX MICROSCOPIC   Imaging Review Dg Chest Port 1 View  07/14/2013   CLINICAL DATA:  Left chest pain.  EXAM: PORTABLE CHEST - 1 VIEW  COMPARISON:  Multiple exams, including 10/11/2012  FINDINGS: Emphysema. Atherosclerosis. Heart size within normal limits. The lungs appear otherwise clear. No pleural effusion.  IMPRESSION: 1. No acute findings. 2. Emphysema. 3. Atherosclerosis.   Electronically Signed   By: Herbie Baltimore M.D.   On: 07/14/2013 01:13    EKG Interpretation   None       MDM  No diagnosis found.   Date: 07/14/2013  Rate: 110  Rhythm: sinus tachycardia  QRS Axis: normal  Intervals: normal  ST/T Wave abnormalities: nonspecific ST/T changes  Conduction Disutrbances:none  Narrative Interpretation:   Old EKG Reviewed: unchanged   Date: 07/14/2013  Rate: 83  Rhythm: normal sinus rhythm  QRS Axis: normal  Intervals: normal  ST/T Wave abnormalities: nonspecific ST/T changes  Conduction Disutrbances:none  Narrative Interpretation:   Old EKG Reviewed: unchanged ST depression inferior leads  Pt comes in with cc of pain in her left arm and palpitations. No known coronary artery dz, + vascular dz hx and CAD risk factors. Allegedly, sx started within seconds of ingestion of cilostazol - side effect profile of which includes palpitations in 1-5 % of people. With palpitations pt has LUE pain and some dexterity issues.  Unsure if this was like a stress test for the patient (the palpitations) which led to angina. She has carotid artery dz, and is it possible that she had a TIa like event - although, it appears that her sx is more pain than heaviness, or numbness tingling.  We will get basic labs. Will be admitted as obs.         Derwood Kaplan,  MD 07/14/13 [phone]

## 2013-07-14 NOTE — Progress Notes (Signed)
[facility]                                                                                Patient Demographics  [name], is a 79 y.o. female, DOB - Oct 22, 1930, ZOX:[id number]  Admit date - 07/13/2013   Admitting Physician Haydee Monica, MD  Outpatient Primary MD for the patient is Bennie Pierini, FNP  LOS - 1   Chief Complaint  Patient presents with  . Arm Pain        Assessment & Plan    1. Palpitations associated with burning sensation in the left arm. Likely her symptoms are coming from palpitations with some demand stress on the heart from increased heart rate, currently in sinus rhythm and symptom-free, recent echo gram noted from few weeks ago, have discontinued her Norvasc and switch to Coreg for better cardioprotection, will check TSH, have requested her primary cardiology team to evaluate her from their standpoint. Continue aspirin and statin for secondary prevention.      2. Left arm tingling and numbness. Likely due to #1 above, however TIA cannot be ruled out, she has evaluated the patient she is getting an MRI for further workup. For now aspirin and statin to be continued.       3. History of dyslipidemia and carotid artery stenosis. Currently on aspirin, Plavix along with statin for secondary prevention, she was recently started on Pletal by vascular surgery. Doubt that this was a side effect of Pletal. However for now Pletal has been held. We'll have her follow with vascular surgery in the outpatient setting post discharge and discussed this medication again.      4. Hypertension. Currently stable on combination of ACE, hydralazine and Coreg.         Code Status: Full  Family Communication: Daughter bedside  Disposition Plan: Home   Procedures CT head, MRI head   Consults neurology, cardiology   Medications  Scheduled Meds: . aspirin  81 mg Oral Daily  . atorvastatin  10 mg Oral Daily  . carvedilol  3.125 mg Oral BID  WC  . cholecalciferol  1,000 Units Oral Daily  . clopidogrel  75 mg Oral Q breakfast  . hydrALAZINE  37.5 mg Oral BID  . lisinopril  40 mg Oral Daily  . sodium chloride  3 mL Intravenous Q12H   Continuous Infusions:  PRN Meds:.sodium chloride, ibuprofen, meclizine, senna-docusate, sodium chloride  DVT Prophylaxis  Lovenox   Lab Results  Component Value Date   PLT 226 07/14/2013    Antibiotics     Anti-infectives   None          Subjective:   Alesia Richards today has, No headache, No chest pain, No abdominal pain - No Nausea, No new weakness tingling or numbness, No Cough - SOB.    Objective:   Filed Vitals:   07/14/13 0215 07/14/13 0230 07/14/13 0411 07/14/13 0650  BP: 125/42 94/34 142/50 152/49  Pulse: 74 69 71 68  Temp:   98 F (36.7 C)   TempSrc:   Oral   Resp: 18 20    Height:   5\' 7"  (1.702 m)   Weight:   57.788 kg (  127 lb 6.4 oz)   SpO2: 97% 93% 98% 96%    Wt Readings from Last 3 Encounters:  07/14/13 57.788 kg (127 lb 6.4 oz)  07/10/13 58.196 kg (128 lb 4.8 oz)  06/29/13 57.153 kg (126 lb)    No intake or output data in the 24 hours ending 07/14/13 1002  Exam Awake Alert, Oriented X 3, No new F.N deficits, Normal affect [place].AT,PERRAL Supple Neck,No JVD, No cervical lymphadenopathy appriciated.  Symmetrical Chest wall movement, Good air movement bilaterally, CTAB RRR,No Gallops,Rubs or new Murmurs, No Parasternal Heave +ve B.Sounds, Abd Soft, Non tender, No organomegaly appriciated, No rebound - guarding or rigidity. No Cyanosis, Clubbing or edema, No new Rash or bruise    Data Review   Micro Results No results found for this or any previous visit (from the past 240 hour(s)).  Radiology Reports    Ct Head Wo Contrast  07/14/2013   CLINICAL DATA:  Arm pain  EXAM: CT HEAD WITHOUT CONTRAST  TECHNIQUE: Contiguous axial images were obtained from the base of the skull through the vertex without intravenous contrast.  COMPARISON:  06/30/2013   FINDINGS: Skull and Sinuses:No significant abnormality.  Orbits: No acute abnormality.  Brain: No evidence of acute abnormality, such as acute infarction, hemorrhage, hydrocephalus, or mass lesion/mass effect. Unchanged pattern of patchy bilateral cerebral white matter low density, consistent with chronic small vessel ischemia. No change in multi focal brain surface calcification, likely from previous granulomatous infection.  IMPRESSION: 1. No evidence of acute intracranial disease. 2. Senescent changes including chronic small vessel ischemia.   Electronically Signed   By: Tiburcio Pea M.D.   On: 07/14/2013 03:26    Dg Chest Port 1 View  07/14/2013   CLINICAL DATA:  Left chest pain.  EXAM: PORTABLE CHEST - 1 VIEW  COMPARISON:  Multiple exams, including 10/11/2012  FINDINGS: Emphysema. Atherosclerosis. Heart size within normal limits. The lungs appear otherwise clear. No pleural effusion.  IMPRESSION: 1. No acute findings. 2. Emphysema. 3. Atherosclerosis.   Electronically Signed   By: Herbie Baltimore M.D.   On: 07/14/2013 01:13    CBC  Recent Labs Lab 07/14/13 0031  WBC 7.2  HGB 8.5*  HCT 24.9*  PLT 226  MCV 87.1  MCH 29.7  MCHC 34.1  RDW 14.3  LYMPHSABS 0.9  MONOABS 0.7  EOSABS 0.1  BASOSABS 0.1    Chemistries   Recent Labs Lab 07/14/13 0031  NA 137  K 3.5  CL 106  CO2 22  GLUCOSE 133*  BUN 15  CREATININE 1.15*  CALCIUM 8.4   ------------------------------------------------------------------------------------------------------------------ estimated creatinine clearance is 35.6 ml/min (by C-G formula based on Cr of 1.15). ------------------------------------------------------------------------------------------------------------------ No results found for this basename: HGBA1C,  in the last 72 hours ------------------------------------------------------------------------------------------------------------------ No results found for this basename: CHOL, HDL, LDLCALC,  TRIG, CHOLHDL, LDLDIRECT,  in the last 72 hours ------------------------------------------------------------------------------------------------------------------ No results found for this basename: TSH, T4TOTAL, FREET3, T3FREE, THYROIDAB,  in the last 72 hours ------------------------------------------------------------------------------------------------------------------ No results found for this basename: VITAMINB12, FOLATE, FERRITIN, TIBC, IRON, RETICCTPCT,  in the last 72 hours  Coagulation profile No results found for this basename: INR, PROTIME,  in the last 168 hours  No results found for this basename: DDIMER,  in the last 72 hours  Cardiac Enzymes  Recent Labs Lab 07/14/13 0031 07/14/13 0545  TROPONINI <0.30 <0.30   ------------------------------------------------------------------------------------------------------------------ No components found with this basename: POCBNP,      Time Spent in minutes   35   Lael Pilch K  M.D on 07/14/2013 at 10:02 AM  Between 7am to 7pm - Pager - [phone]  After 7pm go to [email or web address] - password [email or web address]  And look for the night coverage person covering for me after hours  [facility] Office  [phone]

## 2013-07-14 NOTE — Evaluation (Signed)
Physical Therapy Evaluation Patient Details Name: [name] MRN: [id number] DOB: 16-Jan-1931 Today's Date: 07/14/2013 Time: 4098-1191 PT Time Calculation (min): 36 min  PT Assessment / Plan / Recommendation History of Present Illness  [name] indicated that yesterday she started a new heart medication and after taking the first dose developed " a pounding sensation in my chest and neck, then the left arm started burning, pain, and he left hand got weak". The symptoms lasted for few hours and then went away. She denies associated HA, vertigo, double vision, imbalance, slurred speech, language or vision impairment. CT head negative for acute abnormality  Clinical Impression  Patient is very lively and familiar with the role of PT due to her prior hospitalizations.  Patient ambulates well and performs functional tasks well with supervision and is safe to ambulate with a caregiver while still admitted to hospital.      PT Assessment  Patent does not need any further PT services    Follow Up Recommendations  Supervision - Intermittent          Equipment Recommendations  None recommended by PT    Recommendations for Other Services     Frequency Other (Comment)    Precautions / Restrictions Precautions Precautions: Fall   Pertinent Vitals/Pain None reported      Mobility  Bed Mobility Bed Mobility: Supine to Sit Supine to Sit: 5: Supervision Details for Bed Mobility Assistance: for safety Transfers Transfers: Sit to Stand;Stand to Sit Sit to Stand: 5: Supervision Stand to Sit: 5: Supervision Details for Transfer Assistance: supervision for safety concerns Ambulation/Gait Ambulation/Gait Assistance: 5: Supervision Ambulation Distance (Feet): 600 Feet Assistive device: None Ambulation/Gait Assistance Details: occassional stumbling with gait when distracted with conversation.  Patient reports that when she starts to feel weak and like she's going to fall she heads for  a chair or furniture to sit down in. Gait Pattern: Step-through pattern Stairs: Yes Stairs Assistance: 5: Supervision Stairs Assistance Details (indicate cue type and reason): for safety Stair Management Technique: One rail Left;Alternating pattern;Forwards Number of Stairs: 10    Exercises     PT Diagnosis: Generalized weakness  PT Problem List: Decreased mobility;Decreased strength PT Treatment Interventions:       PT Goals(Current goals can be found in the care plan section) Acute Rehab PT Goals Patient Stated Goal: to return home PT Goal Formulation: With patient/family Time For Goal Achievement: 07/28/13 Potential to Achieve Goals: Good  Visit Information  Last PT Received On: 07/14/13 Assistance Needed: +1 History of Present Illness: [name] indicated that yesterday she started a new heart medication and after taking the first dose developed " a pounding sensation in my chest and neck, then the left arm started burning, pain, and he left hand got weak". The symptoms lasted for few hours and then went away. She denies associated HA, vertigo, double vision, imbalance, slurred speech, language or vision impairment. CT head negative for acute abnormality       Prior Functioning  Home Living Family/patient expects to be discharged to:: Private residence Living Arrangements: Alone;Children Available Help at Discharge: Family Type of Home: House Home Layout: Two level;Bed/bath upstairs Alternate Level Stairs-Number of Steps: 13 Alternate Level Stairs-Rails: Left Home Equipment: Walker - 2 wheels Additional Comments: daughter wants to move pt downstairs, but pt not wanting to move (also has her home in one level apartment, but stays most of the time with daughter) Prior Function Level of Independence: Independent Comments: used walker for a short period  of time after d/c a couple weeks ago Communication Communication: No difficulties    Cognition   Cognition Arousal/Alertness: Awake/alert Behavior During Therapy: WFL for tasks assessed/performed Overall Cognitive Status: Within Functional Limits for tasks assessed    Extremity/Trunk Assessment Upper Extremity Assessment Upper Extremity Assessment: Overall WFL for tasks assessed;LUE deficits/detail LUE Deficits / Details: Weakness present when compared to R arm Lower Extremity Assessment Lower Extremity Assessment: Overall WFL for tasks assessed;RLE deficits/detail;LLE deficits/detail RLE Deficits / Details: MMT 4/5 grossly LLE Deficits / Details: MMT 4/5 grossly   Balance Balance Balance Assessed: Yes Standardized Balance Assessment Standardized Balance Assessment: Dynamic Gait Index Dynamic Gait Index Level Surface: Normal Change in Gait Speed: Mild Impairment Gait with Horizontal Head Turns: Mild Impairment Gait with Vertical Head Turns: Mild Impairment Gait and Pivot Turn: Normal Step Over Obstacle: Normal Step Around Obstacles: Normal Steps: Mild Impairment Total Score: 20  End of Session PT - End of Session Equipment Utilized During Treatment: Gait belt Activity Tolerance: Patient tolerated treatment well Patient left: in bed;with call bell/phone within reach;with bed alarm set;with family/visitor present  GP    Barrie Dunker, SPT Pager:  [phone]  Barrie Dunker 07/14/2013, 4:37 PM

## 2013-07-14 NOTE — Evaluation (Signed)
Read, reviewed, and agree.  Braniya Farrugia B. Saraia Platner, PT, DPT #[id number]  

## 2013-07-14 NOTE — H&P (Signed)
PCP:   Bennie Pierini, FNP   Chief Complaint:  Palpitations and left arm burning sensation  HPI: 79 yo female with h/o pvd, htn, CAS started on pletal yesterday by her cardiologist.  Pt took one dose yesterday morning and soon after started having palpitations and burning sensation in her left arm that lasted briefly.  Then she took another dose of the pletal last night, and the same thing happened, she started feeling palpitations, pounding in her chest and neck with burning sensation in her left arm.  This time it lasted longer so she came to the ED.  She did have some weakness in her left hand when this was happening.  No fevers.  No slurred speech.  No le weakness/n/t.  No n/or tingling in the arm but describes it as burning.  No cp during this or sob.  No swelling anywhere.  No facial drooping or slurred speech.  Now the sympotms have resolved several hours ago.  She believes its the pletal that did this and now is scared to use it again.  Review of Systems:  Positive and negative as per HPI otherwise all other systems are negative  Past Medical History: Past Medical History  Diagnosis Date  . Hypertension     intolerant to Maxzide due to hyponatremia, intolerant to Clonidine due to slow HR/fatigue  . Hyperlipidemia   . Carotid artery occlusion   . Macular degeneration     In both eyes  . Arthritis   . Hiatal hernia   . Chronic kidney disease   . DVT (deep venous thrombosis)   . Anemia   . Irregular heartbeat   . Family history of anesthesia complication     daughter has nausea  . Coronary artery disease    Past Surgical History  Procedure Laterality Date  . Cataract extraction    . Lumbar disc surgery    . Incontinence surgery    . Abdominal hysterectomy  1971  . Neck muscle surgery    . Varicose vein surgery      left leg  . Eye surgery    . Cholecystectomy  2004    Gall Bladder sling    Medications: Prior to Admission medications   Medication Sig Start  Date End Date Taking? Authorizing Provider  amLODipine (NORVASC) 10 MG tablet Take 10 mg by mouth daily.   Yes Historical Provider, MD  aspirin 81 MG tablet Take 81 mg by mouth daily.     Yes Historical Provider, MD  atorvastatin (LIPITOR) 10 MG tablet Take 1 tablet (10 mg total) by mouth daily. 02/17/13  Yes Mary-Margaret Daphine Deutscher, FNP  Calcium Citrate (CALCITRATE PO) Take 1 tablet by mouth daily.    Yes Historical Provider, MD  cholecalciferol (VITAMIN D) 1000 UNITS tablet Take 1,000 Units by mouth daily.   Yes Historical Provider, MD  cilostazol (PLETAL) 100 MG tablet Take 1 tablet (100 mg total) by mouth 2 (two) times daily before a meal. 07/10/13  Yes Nada Libman, MD  clopidogrel (PLAVIX) 75 MG tablet Take 1 tablet (75 mg total) by mouth daily with breakfast. 07/01/13  Yes Ripudeep K Rai, MD  hydrALAZINE (APRESOLINE) 25 MG tablet Take 1.5 tablets (37.5 mg total) by mouth 2 (two) times daily. 02/17/13  Yes Mary-Margaret Daphine Deutscher, FNP  ibuprofen (ADVIL,MOTRIN) 200 MG tablet Take 200 mg by mouth every 6 (six) hours as needed for mild pain.    Yes Historical Provider, MD  lisinopril (PRINIVIL,ZESTRIL) 40 MG tablet Take 1 tablet (40  mg total) by mouth daily. 02/17/13  Yes Mary-Margaret Daphine Deutscher, FNP  meclizine (ANTIVERT) 12.5 MG tablet Take 1 tablet (12.5 mg total) by mouth 2 (two) times daily as needed for dizziness (also available over the counter). 07/01/13  Yes Ripudeep Jenna Luo, MD  Multiple Vitamins-Minerals (PRESERVISION AREDS) CAPS Take 1 capsule by mouth daily.    Yes Historical Provider, MD  senna-docusate (SENOKOT-S) 8.6-50 MG per tablet Take 1-2 tablets by mouth at bedtime as needed for mild constipation. 07/01/13  Yes Ripudeep Jenna Luo, MD    Allergies:   Allergies  Allergen Reactions  . Penicillins Rash    Oral swelling    Social History:  reports that she quit smoking about 3 years ago. She has never used smokeless tobacco. She reports that she does not drink alcohol or use illicit  drugs.  Family History: Family History  Problem Relation Age of Onset  . Cancer Mother     [name]  . Diabetes Sister   . Cancer Sister   . Hyperlipidemia Sister   . Heart attack Sister   . Diabetes Son   . Hyperlipidemia Daughter     Physical Exam: Filed Vitals:   07/14/13 0145 07/14/13 0200 07/14/13 0215 07/14/13 0230  BP: 102/39 92/36 125/42 94/34  Pulse: 71 74 74 69  Temp:      TempSrc:      Resp: 15 17 18 20   SpO2: 96% 97% 97% 93%   General appearance: alert, cooperative and no distress Head: Normocephalic, without obvious abnormality, atraumatic Eyes: negative Nose: Nares normal. Septum midline. Mucosa normal. No drainage or sinus tenderness. Neck: no JVD and supple, symmetrical, trachea midline Lungs: clear to auscultation bilaterally Heart: regular rate and rhythm and systolic murmur: early systolic 4/6, blowing at 2nd left intercostal space Abdomen: soft, non-tender; bowel sounds normal; no masses,  no organomegaly Extremities: extremities normal, atraumatic, no cyanosis or edema Pulses: 2+ and symmetric Skin: Skin color, texture, turgor normal. No rashes or lesions Neurologic: Grossly normal    Labs on Admission:   Recent Labs  07/14/13 0031  NA 137  K 3.5  CL 106  CO2 22  GLUCOSE 133*  BUN 15  CREATININE 1.15*  CALCIUM 8.4    Recent Labs  07/14/13 0031  WBC 7.2  NEUTROABS 5.4  HGB 8.5*  HCT 24.9*  MCV 87.1  PLT 226    Recent Labs  07/14/13 0031  TROPONINI <0.30    Radiological Exams on Admission: Ct Head Wo Contrast  07/14/2013   CLINICAL DATA:  Arm pain  EXAM: CT HEAD WITHOUT CONTRAST  TECHNIQUE: Contiguous axial images were obtained from the base of the skull through the vertex without intravenous contrast.  COMPARISON:  06/30/2013  FINDINGS: Skull and Sinuses:No significant abnormality.  Orbits: No acute abnormality.  Brain: No evidence of acute abnormality, such as acute infarction, hemorrhage, hydrocephalus, or mass  lesion/mass effect. Unchanged pattern of patchy bilateral cerebral white matter low density, consistent with chronic small vessel ischemia. No change in multi focal brain surface calcification, likely from previous granulomatous infection.  IMPRESSION: 1. No evidence of acute intracranial disease. 2. Senescent changes including chronic small vessel ischemia.   Electronically Signed   By: Tiburcio Pea M.D.   On: 07/14/2013 03:26   Dg Chest Port 1 View  07/14/2013   CLINICAL DATA:  Left chest pain.  EXAM: PORTABLE CHEST - 1 VIEW  COMPARISON:  Multiple exams, including 10/11/2012  FINDINGS: Emphysema. Atherosclerosis. Heart size within normal limits. The lungs appear  otherwise clear. No pleural effusion.  IMPRESSION: 1. No acute findings. 2. Emphysema. 3. Atherosclerosis.   Electronically Signed   By: Herbie Baltimore M.D.   On: 07/14/2013 01:13    Assessment/Plan  79 yo female with palpitations, and left arm burning sensation possibly from pletal side effect  Principal Problem:   Burning sensation of left arm-- palpitations reported as significant side effect of pletal with afib??.  ekg now sinus tachycardia mild bigemeny.  obs for romi.  Will need to call cardiology for their input about what to do now about her taking pletal.  Will stop for now.  CT head also pending, obtain neuro cks overnight.  Another concern is possible TIA, but sounds more c/w side effect from pletal.  Active Problems:   DYSLIPIDEMIA   HYPERTENSION   CAROTID STENOSIS   Murmur   Heart palpitations    DAVID,RACHAL A 07/14/2013, 3:37 AM

## 2013-07-15 ENCOUNTER — Other Ambulatory Visit (HOSPITAL_COMMUNITY): Payer: Medicare Other

## 2013-07-15 ENCOUNTER — Observation Stay (HOSPITAL_COMMUNITY): Payer: Medicare Other

## 2013-07-15 DIAGNOSIS — R079 Chest pain, unspecified: Secondary | ICD-10-CM

## 2013-07-15 LAB — BASIC METABOLIC PANEL
BUN: 15 mg/dL (ref 6–23)
Calcium: 8.4 mg/dL (ref 8.4–10.5)
Chloride: 108 mEq/L (ref 96–112)
Creatinine, Ser: 0.98 mg/dL (ref 0.50–1.10)
GFR calc Af Amer: 61 mL/min — ABNORMAL LOW (ref >90)
GFR calc non Af Amer: 53 mL/min — ABNORMAL LOW (ref >90)
Sodium: 138 mEq/L (ref 135–145)

## 2013-07-15 LAB — CBC
HCT: 24.7 % — ABNORMAL LOW (ref 36.0–46.0)
MCHC: 34 g/dL (ref 30.0–36.0)
Platelets: 216 10*3/uL (ref 150–400)
RBC: 2.8 MIL/uL — ABNORMAL LOW (ref 3.87–5.11)
RDW: 14.4 % (ref 11.5–15.5)
WBC: 4.9 10*3/uL (ref 4.0–10.5)

## 2013-07-15 MED ORDER — REGADENOSON 0.4 MG/5ML IV SOLN
INTRAVENOUS | Status: AC
  Filled 2013-07-15: qty 5

## 2013-07-15 MED ORDER — TECHNETIUM TC 99M SESTAMIBI - CARDIOLITE
10.0000 | Freq: Once | INTRAVENOUS | Status: AC | PRN
  Administered 2013-07-15: 10 via INTRAVENOUS

## 2013-07-15 MED ORDER — TECHNETIUM TC 99M SESTAMIBI - CARDIOLITE
30.0000 | Freq: Once | INTRAVENOUS | Status: AC | PRN
  Administered 2013-07-15: 30 via INTRAVENOUS

## 2013-07-15 MED ORDER — CARVEDILOL 3.125 MG PO TABS: 3.1250 mg | ORAL_TABLET | Freq: Two times a day (BID) | ORAL | Status: DC

## 2013-07-15 MED ORDER — REGADENOSON 0.4 MG/5ML IV SOLN
0.4000 mg | Freq: Once | INTRAVENOUS | Status: AC
  Administered 2013-07-15: 0.4 mg via INTRAVENOUS

## 2013-07-15 NOTE — Evaluation (Signed)
Occupational Therapy Evaluation Patient Details Name: BETHAN ADAMEK MRN: [id number] DOB: 1931-06-18 Today's Date: 07/15/2013 Time: 9147-8295 OT Time Calculation (min): 23 min  OT Assessment / Plan / Recommendation History of present illness Mrs. Damman indicated that yesterday she started a new heart medication and after taking the first dose developed " a pounding sensation in my chest and neck, then the left arm started burning, pain, and he left hand got weak". The symptoms lasted for few hours and then went away. She denies associated HA, vertigo, double vision, imbalance, slurred speech, language or vision impairment. CT head negative for acute abnormality   Clinical Impression   Pt moving well. OT provided education to pt and her daughter. No further OT needs at this time.     OT Assessment  Patient does not need any further OT services    Follow Up Recommendations  No OT follow up;Supervision - Intermittent    Barriers to Discharge      Equipment Recommendations  Other (comment);Tub/shower seat (family is going to get a seat)    Recommendations for Other Services    Frequency       Precautions / Restrictions Precautions Precautions: Fall Restrictions Weight Bearing Restrictions: No   Pertinent Vitals/Pain No pain reported.     ADL  Lower Body Dressing: Supervision/safety Where Assessed - Lower Body Dressing: Unsupported sit to stand Toilet Transfer: Supervision/safety Toilet Transfer Method: Sit to stand Toilet Transfer Equipment: Regular height toilet;Grab bars Tub/Shower Transfer: Moderate assistance;Maximal assistance Tub/Shower Transfer Method: Science writer: Other (comment) (practiced stepping over and getting down in tub) Equipment Used: Gait belt Transfers/Ambulation Related to ADLs: Supervision for ambulation. Supervision/Mod I for sit <> stand transfers and Mod/Max A for tub transfer. ADL Comments: Pt reports her balance  feels off a little more than normal, so OT educated to sit to get LB clothing on and to stand in front of chair/bed when pulling up LB clothing. Pt practiced simulated tub transfer and did well stepping over but required Mod/Max A to get up from floor as she likes to get all the way down in the tub. Recommended sitting on shower chair to bathe.  Educated to keep vision checked at eye doctor (diffiiculty tracking with vision test).    OT Diagnosis:    OT Problem List:   OT Treatment Interventions:     OT Goals(Current goals can be found in the care plan section)    Visit Information  Last OT Received On: 07/15/13 Assistance Needed: +1 History of Present Illness: Mrs. Shams indicated that yesterday she started a new heart medication and after taking the first dose developed " a pounding sensation in my chest and neck, then the left arm started burning, pain, and he left hand got weak". The symptoms lasted for few hours and then went away. She denies associated HA, vertigo, double vision, imbalance, slurred speech, language or vision impairment. CT head negative for acute abnormality       Prior Functioning     Home Living Family/patient expects to be discharged to:: Private residence Living Arrangements: Alone;Children Available Help at Discharge: Family Type of Home: House Home Layout: Two level;Bed/bath upstairs Alternate Level Stairs-Number of Steps: 13 Alternate Level Stairs-Rails: Left Home Equipment: Walker - 2 wheels;Grab bars - toilet;Grab bars - tub/shower Additional Comments: stays with daughter some and at her apartment some Prior Function Level of Independence: Independent Comments: used walker for a short period of time after d/c a couple weeks ago Communication Communication:  No difficulties Dominant Hand: Right         Vision/Perception Vision - History Visual History: Macular degeneration (pt has double/multiple vision sometimes) Patient Visual Report: No  change from baseline Vision - Assessment Vision Assessment: Vision tested Tracking/Visual Pursuits: Other (comment) (difficulty with tracking on both sides) Visual Fields: No apparent deficits   Cognition  Cognition Arousal/Alertness: Awake/alert Behavior During Therapy: WFL for tasks assessed/performed Overall Cognitive Status: Within Functional Limits for tasks assessed    Extremity/Trunk Assessment Upper Extremity Assessment Upper Extremity Assessment: Overall WFL for tasks assessed Lower Extremity Assessment Lower Extremity Assessment: Defer to PT evaluation     Mobility Bed Mobility Bed Mobility: Not assessed Transfers Transfers: Sit to Stand;Stand to Sit Sit to Stand: 5: Supervision;From bed;From toilet Stand to Sit: 5: Supervision;6: Modified independent (Device/Increase time);To bed;To toilet Details for Transfer Assistance: supervision for safety. Mod I for stand to sit to bed.     Exercise     Balance     End of Session OT - End of Session Equipment Utilized During Treatment: Gait belt Activity Tolerance: Patient tolerated treatment well Patient left: in bed;with call bell/phone within reach;with family/visitor present  GO Functional Assessment Tool Used: clinical judgment Functional Limitation: Self care Self Care Current Status (Z6109): At least 1 percent but less than 20 percent impaired, limited or restricted Self Care Goal Status (U0454): At least 1 percent but less than 20 percent impaired, limited or restricted Self Care Discharge Status [phone]): At least 1 percent but less than 20 percent impaired, limited or restricted   Earlie Raveling OTR/L [phone] 07/15/2013, 5:30 PM

## 2013-07-15 NOTE — Progress Notes (Signed)
OT Cancellation Note  Patient Details Name: JANNELLE NOTARO MRN: [id number] DOB: 12-03-1930   Cancelled Treatment:    Reason Eval/Treat Not Completed: Other (comment) (Spoke with nurse and pt about to go for test)  Earlie Raveling OTR/L [phone]  07/15/2013, 8:48 AM

## 2013-07-15 NOTE — Progress Notes (Signed)
ANTICOAGULATION CONSULT NOTE - Follow Up Consult  Pharmacy Consult for Lovenox  Indication: VTE prophylaxis  Allergies  Allergen Reactions  . Penicillins Rash    Oral swelling  . Clonidine Derivatives     Slow HR/fatigue   . Maxzide [Hydrochlorothiazide W-Triamterene]     hyponatremia    Patient Measurements: Height: 5\' 7"  (170.2 cm) Weight: 127 lb 6.4 oz (57.788 kg) IBW/kg (Calculated) : 61.6   Vital Signs: Temp: 97.9 F (36.6 C) (11/22 0504) Temp src: Oral (11/22 0504) BP: 153/60 mmHg (11/22 0504) Pulse Rate: 63 (11/22 0504)  Labs:  Recent Labs  07/14/13 0031 07/14/13 0545 07/14/13 1120 07/14/13 1735 07/15/13 0623  HGB 8.5*  --   --   --  8.4*  HCT 24.9*  --   --   --  24.7*  PLT 226  --   --   --  216  CREATININE 1.15*  --   --   --  0.98  TROPONINI <0.30 <0.30 <0.30 <0.30  --     Estimated Creatinine Clearance: 41.8 ml/min (by C-G formula based on Cr of 0.98).   Medications:  Scheduled:  . aspirin  81 mg Oral Daily  . atorvastatin  40 mg Oral q1800  . carvedilol  3.125 mg Oral BID WC  . cholecalciferol  1,000 Units Oral Daily  . clopidogrel  75 mg Oral Q breakfast  . enoxaparin (LOVENOX) injection  40 mg Subcutaneous Q24H  . hydrALAZINE  37.5 mg Oral BID  . lisinopril  40 mg Oral Daily  . sodium chloride  3 mL Intravenous Q12H    Assessment: Pharmacy consulted to dose lovenox for DVT prophylaxis due to low CrCl. Initial CrCl 35 ml/min on 11/21  11/22 this AM, SCr 0.98, CrCl 41.8. I will sign off on this patient. Pharmacy will be made alert of any acute changes through decision support.   Plan:  Continue Lovenox 40mg  SQ Q24H  Ahaan Zobrist B. Artelia Laroche, PharmD Clinical Pharmacist - Resident Pager: [phone] Phone: [phone] 07/15/2013 9:49 AM

## 2013-07-15 NOTE — Discharge Summary (Signed)
[facility]                                                                                   [name], is a 79 y.o. female  DOB 12/08/1930  MRN [id number].  Admission date:  07/13/2013  Admitting Physician  Haydee Monica, MD  Discharge Date:  07/16/2013   Primary MD  Bennie Pierini, FNP  Recommendations for primary care physician for things to follow:   Follow clinically   Admission Diagnosis  Other and unspecified hyperlipidemia [272.4] Unspecified essential hypertension [401.9] Murmur [785.2] Heart palpitations [785.1] Arm pain, anterior, left [729.5]  Discharge Diagnosis   Palpitations likely from Pletal  Principal Problem:   Burning sensation of left arm Active Problems:   DYSLIPIDEMIA   HYPERTENSION   CAROTID STENOSIS   Murmur   Heart palpitations   Arm pain      Past Medical History  Diagnosis Date  . Hypertension     a. Intolerant to Maxzide due to hyponatremia. b. intolerant to Clonidine due to slow HR/fatigue. c. Discrepancy in BP felt due to innominant stenosis.  . Hyperlipidemia   . Carotid artery occlusion     RICA 80-99%, LICA 40-59% by dopplers 06/2013. Also with cerebrovascular disease otherwise on MRA 06/2013.  . Macular degeneration     In both eyes - no longer drives.  . Arthritis   . Hiatal hernia   . Chronic kidney disease   . DVT (deep venous thrombosis)     a. Around age 36, details unclear (SVT vs DVT?), s/p vein stripping without recurrence.  . Anemia     a. "On/off my whole life" - etiology unclear. Has never required blood transfusion.  . Multiple thyroid nodules     a. By [place] 06/2013. Bx recommended.  Marland Kitchen PFO (patent foramen ovale)     a. Per echo in 12/2010.  Marland Kitchen PVD (peripheral vascular disease)     a. R innominant stenosis. b. 06/2013 ABI: 0.80 R, 0.50 L.  . Dizziness     a. Adm 06/2013: felt due to posterior circulation deficits from vertebrobasilar insufficiency and possibly vertigo.  Marland Kitchen Heart murmur     a.  Longstanding. mild MR on prior ECG, also may be in setting of known vasc dz.    Past Surgical History  Procedure Laterality Date  . Cataract extraction    . Lumbar disc surgery    . Incontinence surgery    . Abdominal hysterectomy  1971  . Neck muscle surgery    . Varicose vein surgery      left leg  . Eye surgery    . Cholecystectomy  2004    Gall Bladder sling     Discharge Condition: stable       Follow-up Information   Follow up with Bennie Pierini, FNP. Schedule an appointment as soon as possible for a visit in 2 days.   Specialty:  Nurse Practitioner   Contact information:   [address] [place] [address] [phone]       Follow up with Basilio Cairo, Lala Lund, MD. Schedule an appointment as soon as possible for a visit in 1 week.  Specialty:  Vascular Surgery   Contact information:   [address] [place] [address] [phone]       Follow up with Gates Rigg, MD. Schedule an appointment as soon as possible for a visit in 1 week.   Specialties:  Neurology, Radiology   Contact information:   [address] [place] [address] [phone]       Follow up with Willa Rough, MD. Schedule an appointment as soon as possible for a visit in 1 week.   Specialty:  Cardiology   Contact information:   [address] [place] [address] [phone]         Consults obtained - Cards, Neuro   Discharge Medications      Medication List    STOP taking these medications       amLODipine 10 MG tablet  Commonly known as:  NORVASC     cilostazol 100 MG tablet  Commonly known as:  PLETAL      TAKE these medications       aspirin 81 MG tablet  Take 81 mg by mouth daily.     atorvastatin 10 MG tablet  Commonly known as:  LIPITOR  Take 1 tablet (10 mg total) by mouth daily.     CALCITRATE PO  Take 1 tablet by mouth daily.     carvedilol 3.125 MG tablet  Commonly known as:  COREG  Take  1 tablet (3.125 mg total) by mouth 2 (two) times daily with a meal.     cholecalciferol 1000 UNITS tablet  Commonly known as:  VITAMIN D  Take 1,000 Units by mouth daily.     clopidogrel 75 MG tablet  Commonly known as:  PLAVIX  Take 1 tablet (75 mg total) by mouth daily with breakfast.     hydrALAZINE 25 MG tablet  Commonly known as:  APRESOLINE  Take 1.5 tablets (37.5 mg total) by mouth 2 (two) times daily.     ibuprofen 200 MG tablet  Commonly known as:  ADVIL,MOTRIN  Take 200 mg by mouth every 6 (six) hours as needed for mild pain.     lisinopril 40 MG tablet  Commonly known as:  PRINIVIL,ZESTRIL  Take 1 tablet (40 mg total) by mouth daily.     meclizine 12.5 MG tablet  Commonly known as:  ANTIVERT  Take 1 tablet (12.5 mg total) by mouth 2 (two) times daily as needed for dizziness (also available over the counter).     PRESERVISION AREDS Caps  Take 1 capsule by mouth daily.     senna-docusate 8.6-50 MG per tablet  Commonly known as:  Senokot-S  Take 1-2 tablets by mouth at bedtime as needed for mild constipation.         Diet and Activity recommendation: See Discharge Instructions below   Discharge Instructions     Follow with Primary MD Bennie Pierini, FNP in 2 days   Get CBC, CMP, checked 2 days by Primary MD and again as instructed by your Primary MD.   Get Medicines reviewed and adjusted.  Please request your Prim.MD to go over all Hospital Tests and Procedure/Radiological results at the follow up, please get all Hospital records sent to your Prim MD by signing hospital release before you go home.  Activity: As tolerated with Full fall precautions use walker/cane & assistance as needed   Diet:  Heart healthy   For Heart failure patients - Check your Weight same time everyday, if you  gain over 2 pounds, or you develop in leg swelling, experience more shortness of breath or chest pain, call your Primary MD immediately. Follow Cardiac Low Salt Diet  and 1.8 lit/day fluid restriction.  Disposition Home   If you experience worsening of your admission symptoms, develop shortness of breath, life threatening emergency, suicidal or homicidal thoughts you must seek medical attention immediately by calling 911 or calling your MD immediately  if symptoms less severe.  You Must read complete instructions/literature along with all the possible adverse reactions/side effects for all the Medicines you take and that have been prescribed to you. Take any new Medicines after you have completely understood and accpet all the possible adverse reactions/side effects.   Do not drive and provide baby sitting services if your were admitted for syncope or siezures until you have seen by Primary MD or a Neurologist and advised to do so again.  Do not drive when taking Pain medications.    Do not take more than prescribed Pain, Sleep and Anxiety Medications  Special Instructions: If you have smoked or chewed Tobacco  in the last 2 yrs please stop smoking, stop any regular Alcohol  and or any Recreational drug use.  Wear Seat belts while driving.   Please note  You were cared for by a hospitalist during your hospital stay. If you have any questions about your discharge medications or the care you received while you were in the hospital after you are discharged, you can call the unit and asked to speak with the hospitalist on call if the hospitalist that took care of you is not available. Once you are discharged, your primary care physician will handle any further medical issues. Please note that NO REFILLS for any discharge medications will be authorized once you are discharged, as it is imperative that you return to your primary care physician (or establish a relationship with a primary care physician if you do not have one) for your aftercare needs so that they can reassess your need for medications and monitor your lab values.    Major procedures and  Radiology Reports - PLEASE review detailed and final reports for all details, in brief -       Ct Angio Head W/cm &/or Wo Cm  06/30/2013   CLINICAL DATA:  Right neck pain with dizziness and vertigo, no headache.  EXAM: CT ANGIOGRAPHY HEAD AND NECK  TECHNIQUE: Multidetector CT imaging of the head and neck was performed using the standard protocol during bolus administration of intravenous contrast. Multiplanar CT image reconstructions including MIPs were obtained to evaluate the vascular anatomy. Carotid stenosis measurements (when applicable) are obtained utilizing NASCET criteria, using the distal internal carotid diameter as the denominator.  CONTRAST:  50mL OMNIPAQUE IOHEXOL 350 MG/ML SOLN  COMPARISON:  MRI and am are day of the head June 30, 2013 at 8:01 a.m., CT of the head June 29, 2013, carotid ultrasound April 05, 2007  FINDINGS: CTA HEAD FINDINGS  Non contrast imaging demonstrates normal ventricles and sulci for age. Multiple punctate calcifications at the gray-white matter junction. , without corresponding FLAIR/T2 abnormality, may reflect remote infectious process, less likely treated metastasis. No intraparenchymal hemorrhage, mass effect or midline shift. Patchy white matter hypodensities suggest sequelae of chronic small vessel ischemic disease, better characterized on MRI of the brain from same day, reported separately. No midline shift or mass effect. No acute large vascular territory infarct.  Basal cisterns are patent. Dense calcific atherosclerosis of the carotid siphons.  Anterior circulation:  Bilateral features, cavernous carotid arteries are patent , there appears to be at least mild to moderate narrowing of the bilateral cavernous carotid arteries due to calcific atherosclerosis. Less than 50% narrowing of the right supra clinoid internal carotid artery. Left A1 segment is dominant, with normal appearance the anterior middle cerebral artery; the mild narrowing of the right  anterior cerebral artery. Suggests on prior examination is not apparent on this study. Bilateral middle cerebral arteries are patent, including the more distal segments.  Posterior circulation: Right vertebral artery is dominant. Moderate calcific atherosclerosis of the left extra foraminal, extradural vertebral artery without apparent hemodynamically significant stenosis. At the at the vertebrobasilar junction. A 1 mm calcification is present, which appears to narrow the lumen by approximately 50%, corresponding to MRA abnormality. The remainder the basilar artery is normal in course and caliber, no dissection. Right greater than left posterior communicating arteries are present. Normal appearance of posterior cerebral arteries.  No aneurysm, large vessel occlusion or is suspicious luminal irregularity.  Review of the MIP images confirms the above findings.  CTA NECK FINDINGS  Severe calcific atherosclerosis of the aortic arch, though mild motion degrades evaluation of the main branch vessels. However, there appears to be severe narrowing of the brachiocephalic artery, with the origin of the right common carotid artery is patent though, narrowed by least 50-75%, coronal 114/184. The origin of left common carotid artery appears severely narrowed, greater than 75%, coronal 116/184. Severe narrowing of the distal left common carotid artery, the lumen is narrowed to approximately 2 mm, from 7 mm.  Marked calcific atherosclerosis of the right greater than left carotid bifurcations, with severe narrowing, the origin of the right internal carotid artery is narrowed to approximately 1 mm, and measures 5 mm distally (approximately 80% stenosis). The origin of the left internal carotid artery is narrowed to 1.8 mm, the more distal left internal carotid artery is 5.3 mm (approximately 66% stenosis).  The distal cervical internal carotid artery segments are widely patent.  Severe narrowing of the origin of the left, moderate  to severe in narrowing of the origin the right vertebral arteries. Enhancement of left vertebral artery, to the level the proximal intra foraminal segment where the left vertebral artery is no longer visualized. Within the more distal left vertebral artery foraminal segment, image 143/342 is occlusive calcification, above this level there is reconstitution left vertebral artery, by apparent muscular branches. Subsequent severe narrowing of the left vertebral artery, from the transition intraforaminal to extra foraminal segment.  The right vertebral artery appears widely patent throughout all segments.  No pseudoaneurysm nor dissection.  Complex cystic and solid appearing 19 x 20 mm left thyroid nodule. Punctate calcifications of the right thyroid. Included view of the lung apices demonstrate severe centrilobular emphysema.  Aerodigestive tract is nonsuspicious. Normal appearance of major salivary glands. Degenerative changes cervical spine with grade 1 C4-5 anterolisthesis, on spondylotic basis. Moderate to severe right C3-4 neural foraminal narrowing, severe right C5-6 neural foraminal narrowing.  Review of the MIP images confirms the above findings.  IMPRESSION: CTA head: Focal calcification though vertebrobasilar junction narrows the lumen by approximately 50%, no evidence of dissection. Generalized intracranial atherosclerosis, resulting in apparent less than 50% narrowing of the right supra clinoid internal carotid artery.  CTA neck: Severe stenosis of the right internal carotid artery by NASCET criteria (estimated at 50- 69% on carotid ultrasound April 05, 2007) and moderate stenosis of the left internal carotid artery origin by NASCET criteria.  Severe calcific atherosclerosis of  the aortic arch, resulting in appearance severe stenosis of the origin of the brachiocephalic artery and left common carotid artery. In addition, severely narrowed mid left common carotid artery due to calcific atherosclerosis.   Severe narrowing of the origin left vertebral artery, with exclusion of the proximal intraforaminal left vertebral artery, due to occlusive calcification, with reconstitution distally due to apparent muscular branches. Subsequent severe narrowing of the left vertebral artery from the transition of the intra foraminal extra foraminal segment.  19 x 20 mm complex cystic and solid left thyroid nodule would be better characterized on dedicated thyroid sonogram as clinically indicated.   Electronically Signed   By: Awilda Metro   On: 06/30/2013 17:42   Ct Head Wo Contrast  07/14/2013   CLINICAL DATA:  Arm pain  EXAM: CT HEAD WITHOUT CONTRAST  TECHNIQUE: Contiguous axial images were obtained from the base of the skull through the vertex without intravenous contrast.  COMPARISON:  06/30/2013  FINDINGS: Skull and Sinuses:No significant abnormality.  Orbits: No acute abnormality.  Brain: No evidence of acute abnormality, such as acute infarction, hemorrhage, hydrocephalus, or mass lesion/mass effect. Unchanged pattern of patchy bilateral cerebral white matter low density, consistent with chronic small vessel ischemia. No change in multi focal brain surface calcification, likely from previous granulomatous infection.  IMPRESSION: 1. No evidence of acute intracranial disease. 2. Senescent changes including chronic small vessel ischemia.   Electronically Signed   By: Tiburcio Pea M.D.   On: 07/14/2013 03:26   Ct Head Wo Contrast  06/29/2013   CLINICAL DATA:  Dizziness, seeing black spots  EXAM: CT HEAD WITHOUT CONTRAST  TECHNIQUE: Contiguous axial images were obtained from the base of the skull through the vertex without intravenous contrast.  COMPARISON:  None.  FINDINGS: No skull fracture is noted. No intracranial hemorrhage, mass effect or midline shift. Paranasal sinuses and mastoid air cells are unremarkable. Bilateral parenchymal punctate calcification may represent sequela from prior infection. Mild  cerebral atrophy. No acute cortical infarction. No mass lesion is noted on this unenhanced scan.  IMPRESSION: No acute intracranial abnormality. Mild cerebral atrophy.   Electronically Signed   By: Natasha Mead M.D.   On: 06/29/2013 08:33   Ct Angio Neck W/cm &/or Wo/cm  06/30/2013   CLINICAL DATA:  Right neck pain with dizziness and vertigo, no headache.  EXAM: CT ANGIOGRAPHY HEAD AND NECK  TECHNIQUE: Multidetector CT imaging of the head and neck was performed using the standard protocol during bolus administration of intravenous contrast. Multiplanar CT image reconstructions including MIPs were obtained to evaluate the vascular anatomy. Carotid stenosis measurements (when applicable) are obtained utilizing NASCET criteria, using the distal internal carotid diameter as the denominator.  CONTRAST:  50mL OMNIPAQUE IOHEXOL 350 MG/ML SOLN  COMPARISON:  MRI and am are day of the head June 30, 2013 at 8:01 a.m., CT of the head June 29, 2013, carotid ultrasound April 05, 2007  FINDINGS: CTA HEAD FINDINGS  Non contrast imaging demonstrates normal ventricles and sulci for age. Multiple punctate calcifications at the gray-white matter junction. , without corresponding FLAIR/T2 abnormality, may reflect remote infectious process, less likely treated metastasis. No intraparenchymal hemorrhage, mass effect or midline shift. Patchy white matter hypodensities suggest sequelae of chronic small vessel ischemic disease, better characterized on MRI of the brain from same day, reported separately. No midline shift or mass effect. No acute large vascular territory infarct.  Basal cisterns are patent. Dense calcific atherosclerosis of the carotid siphons.  Anterior circulation: Bilateral features, cavernous carotid  arteries are patent , there appears to be at least mild to moderate narrowing of the bilateral cavernous carotid arteries due to calcific atherosclerosis. Less than 50% narrowing of the right supra clinoid internal  carotid artery. Left A1 segment is dominant, with normal appearance the anterior middle cerebral artery; the mild narrowing of the right anterior cerebral artery. Suggests on prior examination is not apparent on this study. Bilateral middle cerebral arteries are patent, including the more distal segments.  Posterior circulation: Right vertebral artery is dominant. Moderate calcific atherosclerosis of the left extra foraminal, extradural vertebral artery without apparent hemodynamically significant stenosis. At the at the vertebrobasilar junction. A 1 mm calcification is present, which appears to narrow the lumen by approximately 50%, corresponding to MRA abnormality. The remainder the basilar artery is normal in course and caliber, no dissection. Right greater than left posterior communicating arteries are present. Normal appearance of posterior cerebral arteries.  No aneurysm, large vessel occlusion or is suspicious luminal irregularity.  Review of the MIP images confirms the above findings.  CTA NECK FINDINGS  Severe calcific atherosclerosis of the aortic arch, though mild motion degrades evaluation of the main branch vessels. However, there appears to be severe narrowing of the brachiocephalic artery, with the origin of the right common carotid artery is patent though, narrowed by least 50-75%, coronal 114/184. The origin of left common carotid artery appears severely narrowed, greater than 75%, coronal 116/184. Severe narrowing of the distal left common carotid artery, the lumen is narrowed to approximately 2 mm, from 7 mm.  Marked calcific atherosclerosis of the right greater than left carotid bifurcations, with severe narrowing, the origin of the right internal carotid artery is narrowed to approximately 1 mm, and measures 5 mm distally (approximately 80% stenosis). The origin of the left internal carotid artery is narrowed to 1.8 mm, the more distal left internal carotid artery is 5.3 mm (approximately 66%  stenosis).  The distal cervical internal carotid artery segments are widely patent.  Severe narrowing of the origin of the left, moderate to severe in narrowing of the origin the right vertebral arteries. Enhancement of left vertebral artery, to the level the proximal intra foraminal segment where the left vertebral artery is no longer visualized. Within the more distal left vertebral artery foraminal segment, image 143/342 is occlusive calcification, above this level there is reconstitution left vertebral artery, by apparent muscular branches. Subsequent severe narrowing of the left vertebral artery, from the transition intraforaminal to extra foraminal segment.  The right vertebral artery appears widely patent throughout all segments.  No pseudoaneurysm nor dissection.  Complex cystic and solid appearing 19 x 20 mm left thyroid nodule. Punctate calcifications of the right thyroid. Included view of the lung apices demonstrate severe centrilobular emphysema.  Aerodigestive tract is nonsuspicious. Normal appearance of major salivary glands. Degenerative changes cervical spine with grade 1 C4-5 anterolisthesis, on spondylotic basis. Moderate to severe right C3-4 neural foraminal narrowing, severe right C5-6 neural foraminal narrowing.  Review of the MIP images confirms the above findings.  IMPRESSION: CTA head: Focal calcification though vertebrobasilar junction narrows the lumen by approximately 50%, no evidence of dissection. Generalized intracranial atherosclerosis, resulting in apparent less than 50% narrowing of the right supra clinoid internal carotid artery.  CTA neck: Severe stenosis of the right internal carotid artery by NASCET criteria (estimated at 50- 69% on carotid ultrasound April 05, 2007) and moderate stenosis of the left internal carotid artery origin by NASCET criteria.  Severe calcific atherosclerosis of the aortic arch, resulting  in appearance severe stenosis of the origin of the brachiocephalic  artery and left common carotid artery. In addition, severely narrowed mid left common carotid artery due to calcific atherosclerosis.  Severe narrowing of the origin left vertebral artery, with exclusion of the proximal intraforaminal left vertebral artery, due to occlusive calcification, with reconstitution distally due to apparent muscular branches. Subsequent severe narrowing of the left vertebral artery from the transition of the intra foraminal extra foraminal segment.  19 x 20 mm complex cystic and solid left thyroid nodule would be better characterized on dedicated thyroid sonogram as clinically indicated.   Electronically Signed   By: Awilda Metro   On: 06/30/2013 17:42   Ct Cervical Spine Wo Contrast  06/30/2013   CLINICAL DATA:  Right-sided neck pain and dizziness.  EXAM: CT CERVICAL SPINE WITHOUT CONTRAST  TECHNIQUE: Multidetector CT imaging of the cervical spine was performed without intravenous contrast. Multiplanar CT image reconstructions were also generated.  COMPARISON:  None.  FINDINGS: The sagittal reformatted images from the CT angiogram of the head neck demonstrates moderate degenerative cervical spondylosis with multilevel disc disease and facet disease. There are fused facets and partial interbody fusion C3-4. No acute fractures identified. No destructive bony changes or worrisome bone lesion. The skullbase C1 and C1-2 articulations are maintained. There are moderate degenerative changes at C1-2. The dens is intact.  No large disc protrusions or significant spinal stenosis. The spinal canal is generous. There is mild right foraminal stenosis at C5-6 due to uncinate spurring. Mild bilateral foraminal stenosis at C6-7 due to uncinate spurring.  The lung apices are clear. Emphysematous changes are noted. The thyroid gland is enlarged and there is a mixed solid and cystic lesion in the left thyroid lobe. Recommend ultrasound correlation and potential biopsy.  IMPRESSION: Degenerative  cervical spondylosis with multilevel disc disease and facet disease.  No acute bony findings.  Mixed cystic and solid left thyroid lesion. Recommend ultrasound correlation and possible biopsy.   Electronically Signed   By: Loralie Champagne M.D.   On: 06/30/2013 18:15   Mr Maxine Glenn Head Wo Contrast  06/30/2013   CLINICAL DATA:  Dizziness. History of carotid artery stenosis. Stroke risk factors include hypertension, and hyperlipidemia.  EXAM: MRI HEAD WITHOUT CONTRAST  MRA HEAD WITHOUT CONTRAST  TECHNIQUE: Multiplanar, multiecho pulse sequences of the brain and surrounding structures were obtained without intravenous contrast. Angiographic images of the head were obtained using MRA technique without contrast.  COMPARISON:  CT head 06/29/2013 is unremarkable. Carotid Dopplers from 04/05/2007 revealed bilateral carotid artery stenosis not clearly flow reducing.  FINDINGS: MRI HEAD FINDINGS  No evidence for acute infarction, hemorrhage, mass lesion, hydrocephalus, or extra-axial fluid. Mild to moderate age-related cerebral and cerebellar atrophy. Moderate small vessel type changes throughout the periventricular and subcortical white matter. No large vessel or significant lacunar infarct.  Flow voids are maintained throughout the carotid, basilar, and vertebral arteries. There are no areas of chronic hemorrhage. Possible subcentimeter T2 bright lesion right pituitary gland likely incidental cyst. No elevation of the pituitary contour or, cavernous sinus, or suprasellar mass lesion. Normal cerebellar tonsils and upper cervical region  Visualized calvarium, skull base, and upper cervical osseous structures unremarkable. Scalp and extracranial soft tissues, orbits, sinuses, and mastoids show no acute process.  MRA HEAD FINDINGS  Mild non stenotic irregularity both internal carotid arteries. Slight medial outpouching left cavernous ICA could be atheromatous aneurysmal dilatation. No visible intracranial berry aneurysm.  Moderate  narrowing of the proximal anterior cerebral artery on the  right unlikely to be clinically significant with a normal and dominant left A1 ACA. No proximal MCA stenosis. Intact circle of Willis as with bilateral patent posterior communicating arteries.  Mild to moderate irregularity of the proximal basilar artery. Question focal stenosis versus asymmetric sensation versus localized dissection. Right vertebral is the dominant contributor with left vertebral smaller but patent. No PCA stenosis. No cerebellar branch occlusion.  IMPRESSION: No acute intracranial abnormality. Mild to moderate age-related atrophy and chronic microvascular ischemic change.  Possible subcentimeter lesion within the right pituitary gland, likely incidental cyst.  Mild to moderate irregularity of the proximal basilar artery; question focal stenosis versus asymmetric fenestration versus localized dissection. Consider CTA head neck for further evaluation.   Electronically Signed   By: Davonna Belling M.D.   On: 06/30/2013 09:55   Mr Brain Wo Contrast  07/16/2013   CLINICAL DATA:  Left arm/hand burning and weakness  EXAM: MRI HEAD WITHOUT CONTRAST  TECHNIQUE: Multiplanar, multiecho pulse sequences of the brain and surrounding structures were obtained without intravenous contrast.  COMPARISON:  Prior MRI from 06/30/2013  FINDINGS: Again seen are scattered foci of T2/FLAIR hyperintensity within the periventricular and deep white matter of both cerebral hemispheres, most compatible with chronic microvascular ischemic changes. Mild diffuse age-related atrophy is unchanged. No other parenchymal signal abnormality identified. There is no mass lesion or midline shift. No extra-axial fluid collection. Ventricles are normal in size without evidence of hydrocephalus.  No diffusion-weighted signal abnormality to suggest acute intracranial infarct identified. There is no intracranial hemorrhage. Normal intravascular flow voids are seen within the  intracranial arterial circulation.  Craniocervical junction is within normal limits. Subcentimeter cystic lesion within the right pituitary gland again noted, unchanged. The optic nerves and globes are within normal limits. Craniocervical junction is normal.  The calvarium demonstrates a normal appearance with normal signal intensity. Paranasal sinuses and mastoid air cells are clear.  IMPRESSION: 1. No acute intracranial infarct or other process identified. 2. Stable appearance of mild atrophy and chronic microvascular ischemic changes.   Electronically Signed   By: Rise Mu M.D.   On: 07/16/2013 00:35   Mr Brain Wo Contrast  06/30/2013   CLINICAL DATA:  Dizziness. History of carotid artery stenosis. Stroke risk factors include hypertension, and hyperlipidemia.  EXAM: MRI HEAD WITHOUT CONTRAST  MRA HEAD WITHOUT CONTRAST  TECHNIQUE: Multiplanar, multiecho pulse sequences of the brain and surrounding structures were obtained without intravenous contrast. Angiographic images of the head were obtained using MRA technique without contrast.  COMPARISON:  CT head 06/29/2013 is unremarkable. Carotid Dopplers from 04/05/2007 revealed bilateral carotid artery stenosis not clearly flow reducing.  FINDINGS: MRI HEAD FINDINGS  No evidence for acute infarction, hemorrhage, mass lesion, hydrocephalus, or extra-axial fluid. Mild to moderate age-related cerebral and cerebellar atrophy. Moderate small vessel type changes throughout the periventricular and subcortical white matter. No large vessel or significant lacunar infarct.  Flow voids are maintained throughout the carotid, basilar, and vertebral arteries. There are no areas of chronic hemorrhage. Possible subcentimeter T2 bright lesion right pituitary gland likely incidental cyst. No elevation of the pituitary contour or, cavernous sinus, or suprasellar mass lesion. Normal cerebellar tonsils and upper cervical region  Visualized calvarium, skull base, and upper  cervical osseous structures unremarkable. Scalp and extracranial soft tissues, orbits, sinuses, and mastoids show no acute process.  MRA HEAD FINDINGS  Mild non stenotic irregularity both internal carotid arteries. Slight medial outpouching left cavernous ICA could be atheromatous aneurysmal dilatation. No visible intracranial berry aneurysm.  Moderate  narrowing of the proximal anterior cerebral artery on the right unlikely to be clinically significant with a normal and dominant left A1 ACA. No proximal MCA stenosis. Intact circle of Willis as with bilateral patent posterior communicating arteries.  Mild to moderate irregularity of the proximal basilar artery. Question focal stenosis versus asymmetric sensation versus localized dissection. Right vertebral is the dominant contributor with left vertebral smaller but patent. No PCA stenosis. No cerebellar branch occlusion.  IMPRESSION: No acute intracranial abnormality. Mild to moderate age-related atrophy and chronic microvascular ischemic change.  Possible subcentimeter lesion within the right pituitary gland, likely incidental cyst.  Mild to moderate irregularity of the proximal basilar artery; question focal stenosis versus asymmetric fenestration versus localized dissection. Consider CTA head neck for further evaluation.   Electronically Signed   By: Davonna Belling M.D.   On: 06/30/2013 09:55   US Soft Tissue Head/neck  07/01/2013   CLINICAL DATA:  Right thyroid lesion  EXAM: THYROID ULTRASOUND  TECHNIQUE: Ultrasound examination of the thyroid gland and adjacent soft tissues was performed.  COMPARISON:  None.  FINDINGS: Right thyroid lobe  Measurements: 4.9 x 2.4 x 1.4 cm. Multiple small thyroid nodules including a 6 mm solid nodule in the right lower gland. 4 mm calcification in the right mid gland.  Left thyroid lobe  Measurements: 4.8 x 2.0 x 2.8 cm. Dominant nodules include a 13 x 9 x 14 mm solid nodule in the left mid gland, a hypervascular 2.9 x 1.7 x 1.9  cm mixed cystic/solid nodule in the left mid gland, and a 12 x 10 x 10 mm solid nodule in the left lower gland. Additional smaller thyroid nodules.  Isthmus  Thickness: 7 mm in thickness.  No nodules visualized.  Lymphadenopathy  None visualized.  IMPRESSION: Multiple bilateral thyroid nodules, including a dominant 2.9 cm mixed cystic/nodule in the right mid gland.  Ultrasound-guided biopsy should be considered on outpatient basis.  This recommendation is as per the consensus statement: Management of Thyroid Nodules Detected at [place]: Society of Radiologists in Ultrasound Consensus Conference Statement. Radiology 2005; [id number].   Electronically Signed   By: Charline Bills M.D.   On: 07/01/2013 11:44   Nm Myocar Multi W/spect W/wall Motion / Ef  07/15/2013   CLINICAL DATA:  Chest pain  EXAM: MYOCARDIAL IMAGING WITH SPECT (REST AND PHARMACOLOGIC-STRESS)  GATED LEFT VENTRICULAR WALL MOTION STUDY  LEFT VENTRICULAR EJECTION FRACTION  TECHNIQUE: Standard myocardial SPECT imaging was performed after resting intravenous injection of 10 mCi Tc-68m sestamibi. Subsequently, intravenous infusion of Lexiscan was performed under the supervision of the Cardiology staff. At peak effect of the drug, 30 mCi Tc-56m sestamibi was injected intravenously and standard myocardial SPECT imaging was performed. Quantitative gated imaging was also performed to evaluate left ventricular wall motion, and estimate left ventricular ejection fraction.  COMPARISON:  None.  FINDINGS: SPECT imaging demonstrates no reversible or irreversible defects to suggest ischemia or infarction.  Quantitative gated analysis shows subjectively decreased wall motion and thickening, but quantitatively, the ejection fraction is within normal limits.  The resting left ventricular ejection fraction is 52% with end-diastolic volume of 114 ml and end-systolic volume of 55 ml.  IMPRESSION: No evidence of ischemia or infarction.  Ejection fraction 52%.    Electronically Signed   By: Charlett Nose M.D.   On: 07/15/2013 14:19   Dg Chest Port 1 View  07/14/2013   CLINICAL DATA:  Left chest pain.  EXAM: PORTABLE CHEST - 1 VIEW  COMPARISON:  Multiple exams, including  10/11/2012  FINDINGS: Emphysema. Atherosclerosis. Heart size within normal limits. The lungs appear otherwise clear. No pleural effusion.  IMPRESSION: 1. No acute findings. 2. Emphysema. 3. Atherosclerosis.   Electronically Signed   By: Herbie Baltimore M.D.   On: 07/14/2013 01:13    Micro Results      No results found for this or any previous visit (from the past 240 hour(s)).   History of present illness and  Hospital Course:     Kindly see H&P for history of present illness and admission details, please review complete Labs, Consult reports and Test reports for all details in brief [name], is a 79 y.o. female, patient with history of  peripheral arterial disease, artery stenosis, dyslipidemia, hypertension was admitted with chief complaints of palpitations and some left arm tingling and numbness all likely due to side effect of the new antiplatelet medication Pletal which was started recently. She had recently undergone a full workup for CVA during previous admission which included an echogram, carotid duplex and MRI, this admission she was seen again by neurology and cardiology and she underwent another MRI which was unremarkable, she ruled out for MI with serial negative troponins, she remained stable on telemetry, she was cleared for home discharge by cardiology and neurology.  Discussed her case in detail with her primary vascular surgeon Dr. Myra Gianotti agrees with discontinuing of Pletal, she will continue on her previous medications for her chronic medical problems which include carotid artery stenosis, dyslipidemia and hypertension. She clinically stable and completely symptom-free now. Will be discharged with close outpatient followup with primary care physician, vascular  surgery and cardiology.       Today   Subjective:   Caera Enwright today has no headache,no chest abdominal pain,no new weakness tingling or numbness, feels much better wants to go home today.    Objective:   Blood pressure 127/51, pulse 58, temperature 98.3 F (36.8 C), temperature source Oral, resp. rate 18, height 5\' 7"  (1.702 m), weight 58.196 kg (128 lb 4.8 oz), SpO2 96.00%.   Intake/Output Summary (Last 24 hours) at 07/16/13 0818 Last data filed at 07/15/13 1755  Gross per 24 hour  Intake    440 ml  Output      0 ml  Net    440 ml    Exam Awake Alert, Oriented *3, No new F.N deficits, Normal affect [place].AT,PERRAL Supple Neck,No JVD, No cervical lymphadenopathy appriciated.  Symmetrical Chest wall movement, Good air movement bilaterally, CTAB RRR,No Gallops,Rubs or new Murmurs, No Parasternal Heave +ve B.Sounds, Abd Soft, Non tender, No organomegaly appriciated, No rebound -guarding or rigidity. No Cyanosis, Clubbing or edema, No new Rash or bruise  Data Review   CBC w Diff: Lab Results  Component Value Date   WBC 4.9 07/15/2013   HGB 8.4* 07/15/2013   HCT 24.7* 07/15/2013   PLT 216 07/15/2013   LYMPHOPCT 12 07/14/2013   MONOPCT 10 07/14/2013   EOSPCT 1 07/14/2013   BASOPCT 1 07/14/2013    CMP: Lab Results  Component Value Date   NA 138 07/15/2013   K 4.0 07/15/2013   CL 108 07/15/2013   CO2 23 07/15/2013   BUN 15 07/15/2013   CREATININE 0.98 07/15/2013   CREATININE 0.94 02/17/2013   PROT 5.9* 07/14/2013   ALBUMIN 3.1* 07/14/2013   BILITOT 0.5 07/14/2013   ALKPHOS 63 07/14/2013   AST 16 07/14/2013   ALT 10 07/14/2013  .   Total Time in preparing paper work, data evaluation and  todays exam - 35 minutes  Leroy Sea M.D on 07/16/2013 at 8:18 AM  [facility] Office  [phone]

## 2013-07-17 ENCOUNTER — Other Ambulatory Visit: Payer: Self-pay | Admitting: Neurology

## 2013-07-17 NOTE — Progress Notes (Signed)
07/14/13 1500  PT G-Codes **NOT FOR INPATIENT CLASS**  Functional Assessment Tool Used assist levels  Functional Limitation Mobility: Walking and moving around  Mobility: Walking and Moving Around Current Status [phone]) CI  Mobility: Walking and Moving Around Goal Status (W0981) CI  Mobility: Walking and Moving Around Discharge Status [phone]) CI  G-codes entered late for eval completed on 07/14/13.  Rollene Rotunda Javeria Briski, PT, DPT [phone]

## 2013-07-18 ENCOUNTER — Telehealth: Payer: Self-pay | Admitting: Surgery

## 2013-07-18 ENCOUNTER — Telehealth: Payer: Self-pay | Admitting: *Deleted

## 2013-07-18 NOTE — Telephone Encounter (Addendum)
Message copied by Fredrich Birks on Tue Jul 18, 2013  9:14 AM ------      Message from: Nada Libman      Created: Mon Jul 17, 2013  5:11 PM      Regarding: RE: Discontinued Med       Tell her to keep appt in Jan and to monitor her symptoms until then      ----- Message -----         From: Fredrich Birks         Sent: 07/17/2013   1:21 PM           To: Nada Libman, MD, Fredrich Birks      Subject: Discontinued Med                                         Dr Myra Gianotti,            Ms Trotter daughter called today to inform us that Ms Buhl had been hospitalized, at [facility], Thursday through Sunday for rapid heart rate and "heart throbbing". The hospitalist discontinued the cilostazol and told the patient to follow up with you since it was prescribed at her last office visit on 07/10/2013.            Do you want to see the patient in the office to discuss other options? If it is something that can be discussed over the phone, it is best to speak with Ms Hakim daughter [name] at [phone]. Please let me know if an office visit needs to be scheduled.            Thank you,            Annabelle Harman       ------  07/18/13: spoke with pts daughter, [name], to inform of VWBs instructions. She agrees with the plan and will call if they needs [place] before Jan appt, dpm

## 2013-07-18 NOTE — Telephone Encounter (Signed)
Daughter calling about appt for pt.  Pt seen at [facility].  I donot see Dr. Pearlean Brownie as provider.  Made New Patient  appt 07-31-13 at 1300, for carotid stenosis, TIA?   [phone].

## 2013-07-28 ENCOUNTER — Encounter (INDEPENDENT_AMBULATORY_CARE_PROVIDER_SITE_OTHER): Payer: Medicare Other | Admitting: Ophthalmology

## 2013-07-28 DIAGNOSIS — H35039 Hypertensive retinopathy, unspecified eye: Secondary | ICD-10-CM

## 2013-07-28 DIAGNOSIS — I10 Essential (primary) hypertension: Secondary | ICD-10-CM

## 2013-07-28 DIAGNOSIS — H43819 Vitreous degeneration, unspecified eye: Secondary | ICD-10-CM

## 2013-07-28 DIAGNOSIS — H3530 Unspecified macular degeneration: Secondary | ICD-10-CM

## 2013-07-28 DIAGNOSIS — H353290 Exudative age-related macular degeneration, unspecified eye, stage unspecified: Secondary | ICD-10-CM

## 2013-08-01 ENCOUNTER — Encounter (INDEPENDENT_AMBULATORY_CARE_PROVIDER_SITE_OTHER): Payer: Self-pay

## 2013-08-01 ENCOUNTER — Ambulatory Visit (INDEPENDENT_AMBULATORY_CARE_PROVIDER_SITE_OTHER): Payer: Self-pay

## 2013-08-01 ENCOUNTER — Ambulatory Visit (INDEPENDENT_AMBULATORY_CARE_PROVIDER_SITE_OTHER): Payer: Medicare Other

## 2013-08-01 DIAGNOSIS — Z0289 Encounter for other administrative examinations: Secondary | ICD-10-CM

## 2013-08-01 DIAGNOSIS — I6529 Occlusion and stenosis of unspecified carotid artery: Secondary | ICD-10-CM

## 2013-08-07 ENCOUNTER — Ambulatory Visit: Payer: Self-pay | Admitting: Neurology

## 2013-08-10 ENCOUNTER — Ambulatory Visit: Payer: Medicare Other | Admitting: Nurse Practitioner

## 2013-08-15 ENCOUNTER — Ambulatory Visit: Payer: Self-pay | Admitting: Neurology

## 2013-08-18 ENCOUNTER — Other Ambulatory Visit: Payer: Self-pay | Admitting: Nurse Practitioner

## 2013-09-11 ENCOUNTER — Ambulatory Visit: Payer: Medicare Other | Admitting: Neurology

## 2013-09-12 ENCOUNTER — Ambulatory Visit (INDEPENDENT_AMBULATORY_CARE_PROVIDER_SITE_OTHER): Payer: Medicare Other | Admitting: Neurology

## 2013-09-12 ENCOUNTER — Encounter: Payer: Self-pay | Admitting: Neurology

## 2013-09-12 VITALS — BP 167/61 | HR 54 | Ht 67.0 in | Wt 133.0 lb

## 2013-09-12 DIAGNOSIS — I6529 Occlusion and stenosis of unspecified carotid artery: Secondary | ICD-10-CM

## 2013-09-12 NOTE — Patient Instructions (Signed)
I had a long discussion with the patient and daughter regarding her vascular imaging findings and answered questions. Recommend continue aspirin and Plavix for stroke prevention given severe extra and intracranial multiple  vascular stenosis. Maintain strict control of hypertension but blood pressure goal below 130/90. Increase carvedilol to 3.125 mg twice daily. Strict control of hyperlipidemia with LDL cholesterol goal below 100 mg percent. Continue follow up with vascular surgeon for asymptomatic right ICA stenosis. Return for followup in 3 months or call earlier if necessary.

## 2013-09-12 NOTE — Progress Notes (Signed)
[facility] [address] [place]. [place] [address] [phone]       OFFICE FOLLOW-UP NOTE  Ms. [name] Date of Birth:  02-15-1931 Medical Record Number:  [id number]   HPI: 23 year Caucasian lady seen for first office follow visit for in-hospital consultation for dizziness. She was admitted to [facility] twice in early and late November. The first admission was for dizziness. She was consultation Dr. Cyril Mourning for dizziness an MRI scan of the brain 06/30/13 showed no acute abnormality only mild changes of chronic microvascular ischemia and generalized atrophy. She was subsequently readmitted 2 weeks later when she came in with chest pain and repeat MR scan again showed no acute infarct. She had   history of asymptomatic right extracranial right carotid stenosis for which she was being followed conservatively by vascular surgeon Dr. Myra Gianotti. CT angiogram of the head and neck dated 06/30/13 showed severe narrowing of the brachiocephalic artery and with narrowing of the origin of the right common carotid artery to 50-75% he had origin of left common carotid artery showed 75% narrowing. There was marked calcification and stenosis of the right internal carotid artery she 80% stenosis in left ICA showed 66% stenosis. CT angiogram of the brain showed 50% stenosis of the vertebrobasilar junction and 50% narrowing right supraclinoid internal carotid artery. The patient had previously been followed by Dr. Myra Gianotti and the last carotid ultrasound in August 2008 had 50-69% right ICA stenosis. Right carotid stenosis was found with progressive the patient was seen a vascular surgeon and she she does have and followup appointment with Dr. Caryl Bis the next few weeks. The patient has subsequently had outpatient to Transcranial emboli monitoring in our office on 08/01/13 which showed no spontaneous emboli during 30 minutes of monitoring. Transcanal Doppler studies showed mild stenosis of the left  middle cerebral artery and M1 segment, left anterior cerebral artery and A1 segment and left cavernous carotid and right vertebral artery and proximal basilar arteries. ROS:   14 system review of systems is positive for  blurred vision, loss of vision, easy bruising, he easy bleeding, dizziness and all other systems negative PMH:  Past Medical History  Diagnosis Date  . Hypertension     a. Intolerant to Maxzide due to hyponatremia. b. intolerant to Clonidine due to slow HR/fatigue. c. Discrepancy in BP felt due to innominant stenosis.  . Hyperlipidemia   . Carotid artery occlusion     RICA 80-99%, LICA 40-59% by dopplers 06/2013. Also with cerebrovascular disease otherwise on MRA 06/2013.  . Macular degeneration     In both eyes - no longer drives.  . Arthritis   . Hiatal hernia   . Chronic kidney disease   . DVT (deep venous thrombosis)     a. Around age 63, details unclear (SVT vs DVT?), s/p vein stripping without recurrence.  . Anemia     a. "On/off my whole life" - etiology unclear. Has never required blood transfusion.  . Multiple thyroid nodules     a. By [place] 06/2013. Bx recommended.  Marland Kitchen PFO (patent foramen ovale)     a. Per echo in 12/2010.  Marland Kitchen PVD (peripheral vascular disease)     a. R innominant stenosis. b. 06/2013 ABI: 0.80 R, 0.50 L.  . Dizziness     a. Adm 06/2013: felt due to posterior circulation deficits from vertebrobasilar insufficiency and possibly vertigo.  Marland Kitchen Heart murmur     a. Longstanding. mild MR on prior ECG, also may be in  setting of known vasc dz.    Social History:  History   Social History  . Marital Status: Widowed    Spouse Name: N/A    Number of Children: 6  . Years of Education: 8th   Occupational History  . retired    Social History Main Topics  . Smoking status: Former Smoker    Quit date: 08/24/2009  . Smokeless tobacco: Never Used  . Alcohol Use: No  . Drug Use: No  . Sexual Activity: No   Other Topics Concern  . Not on file    Social History Narrative  . No narrative on file    Medications:   Current Outpatient Prescriptions on File Prior to Visit  Medication Sig Dispense Refill  . aspirin 81 MG tablet Take 81 mg by mouth daily.        Marland Kitchen atorvastatin (LIPITOR) 10 MG tablet TAKE ONE TABLET BY MOUTH ONE TIME DAILY  30 tablet  0  . Calcium Citrate (CALCITRATE PO) Take 1 tablet by mouth daily.       . carvedilol (COREG) 3.125 MG tablet Take 1 tablet (3.125 mg total) by mouth 2 (two) times daily with a meal.  60 tablet  1  . cholecalciferol (VITAMIN D) 1000 UNITS tablet Take 1,000 Units by mouth daily.      . clopidogrel (PLAVIX) 75 MG tablet Take 1 tablet (75 mg total) by mouth daily with breakfast.  30 tablet  5  . hydrALAZINE (APRESOLINE) 25 MG tablet Take 1.5 tablets (37.5 mg total) by mouth 2 (two) times daily.  135 tablet  3  . ibuprofen (ADVIL,MOTRIN) 200 MG tablet Take 200 mg by mouth every 6 (six) hours as needed for mild pain.       Marland Kitchen lisinopril (PRINIVIL,ZESTRIL) 40 MG tablet Take 1 tablet (40 mg total) by mouth daily.  30 tablet  5  . meclizine (ANTIVERT) 12.5 MG tablet Take 1 tablet (12.5 mg total) by mouth 2 (two) times daily as needed for dizziness (also available over the counter).  60 tablet  3  . Multiple Vitamins-Minerals (PRESERVISION AREDS) CAPS Take 1 capsule by mouth daily.       Marland Kitchen senna-docusate (SENOKOT-S) 8.6-50 MG per tablet Take 1-2 tablets by mouth at bedtime as needed for mild constipation.  60 tablet  3   No current facility-administered medications on file prior to visit.    Allergies:   Allergies  Allergen Reactions  . Penicillins Rash    Oral swelling  . Clonidine Derivatives     Slow HR/fatigue   . Maxzide [Hydrochlorothiazide W-Triamterene]     hyponatremia    Physical Exam General: Frail elderly Caucasian lady seated, in no evident distress Head: head normocephalic and atraumatic. Orohparynx benign Neck: supple with harsh bilateral carotid   bruits Cardiovascular:  regular rate and rhythm, no murmurs Musculoskeletal: no deformity Skin:  no rash/petichiae Vascular:  Normal pulses all extremities Filed Vitals:   09/12/13 1241  BP: 167/61  Pulse: 54.both radial pulses strongly felt. No significant blood pressure differences in both upper extremities. Adson test is negative    Neurologic Exam Mental Status: Awake and fully alert. Oriented to place and time. Recent and remote memory intact. Attention span, concentration and fund of knowledge appropriate. Mood and affect appropriate.  Cranial Nerves: Fundoscopic exam reveals sharp disc margins. Pupils equal, briskly reactive to light. Extraocular movements full without nystagmus. Visual fields full to confrontation. Hearing intact. Facial sensation intact. Face, tongue, palate moves normally and symmetrically.  Motor: Normal bulk and tone. Normal strength in all tested extremity muscles. Sensory.: intact to touch and pinprick and vibratory sensation.  Coordination: Rapid alternating movements normal in all extremities. Finger-to-nose and heel-to-shin performed accurately bilaterally. Gait and Station: Arises from chair without difficulty. Stance is normal. Gait demonstrates normal stride length and balance . Not able to heel, toe and tandem walk without difficulty.  Reflexes: 1+ and symmetric. Toes downgoing.     ASSESSMENT: [age] year pleasant Caucasian Lady with asymptomatic extra and intracranial cerebrovascular disease with recent admission for dizziness likely due to peripheral vestibular dysfunction. Vascular risk factors of hypertension, hyperlipidemia, occlusive cerebrovascular disease    PLAN: I had a long discussion with the patient and daughter regarding her vascular imaging findings and answered questions. Recommend continue aspirin and Plavix for stroke prevention given severe extra and intracranial multiple  vascular stenosis. Maintain strict control of hypertension but blood pressure goal below  130/90. Increase carvedilol to 3.125 mg twice daily. Strict control of hyperlipidemia with LDL cholesterol goal below 100 mg percent. Continue follow up with vascular surgeon for asymptomatic right ICA stenosis. Return for followup in 3 months or call earlier if necessary.    Note: This document was prepared with digital dictation and possible smart phrase technology. Any transcriptional errors that result from this process are unintentional

## 2013-09-17 ENCOUNTER — Other Ambulatory Visit: Payer: Self-pay | Admitting: Nurse Practitioner

## 2013-09-19 ENCOUNTER — Ambulatory Visit (INDEPENDENT_AMBULATORY_CARE_PROVIDER_SITE_OTHER): Payer: Medicare Other | Admitting: Nurse Practitioner

## 2013-09-19 ENCOUNTER — Encounter: Payer: Self-pay | Admitting: Nurse Practitioner

## 2013-09-19 VITALS — BP 195/70 | HR 60 | Temp 97.4°F | Ht 67.0 in | Wt 130.0 lb

## 2013-09-19 DIAGNOSIS — I10 Essential (primary) hypertension: Secondary | ICD-10-CM

## 2013-09-19 DIAGNOSIS — E785 Hyperlipidemia, unspecified: Secondary | ICD-10-CM

## 2013-09-19 DIAGNOSIS — Z13820 Encounter for screening for osteoporosis: Secondary | ICD-10-CM

## 2013-09-19 DIAGNOSIS — Z23 Encounter for immunization: Secondary | ICD-10-CM

## 2013-09-19 DIAGNOSIS — I6529 Occlusion and stenosis of unspecified carotid artery: Secondary | ICD-10-CM

## 2013-09-19 DIAGNOSIS — R011 Cardiac murmur, unspecified: Secondary | ICD-10-CM

## 2013-09-19 MED ORDER — CLOPIDOGREL BISULFATE 75 MG PO TABS: 75.0000 mg | ORAL_TABLET | Freq: Every day | ORAL | Status: DC

## 2013-09-19 MED ORDER — CARVEDILOL 3.125 MG PO TABS: 3.1250 mg | ORAL_TABLET | Freq: Two times a day (BID) | ORAL | Status: DC

## 2013-09-19 MED ORDER — ATORVASTATIN CALCIUM 10 MG PO TABS: 10.0000 mg | ORAL_TABLET | Freq: Every day | ORAL | Status: DC

## 2013-09-19 MED ORDER — LISINOPRIL 40 MG PO TABS: 40.0000 mg | ORAL_TABLET | Freq: Every day | ORAL | Status: DC

## 2013-09-19 NOTE — Progress Notes (Signed)
Subjective:    Patient ID: [name], female    DOB: [birth year], 80 y.o.   MRN: [id number]   Patient in today for follow up- SHe has been in the hospital for stroke like symptoms which actually ended up being Vertigo- SHe was given meclizine which she cannot tolerate. No OTC meds.  Hypertension This is a chronic problem. The current episode started more than 1 year ago. The problem has been resolved since onset. The problem is controlled. Pertinent negatives include no anxiety or shortness of breath. Risk factors for coronary artery disease include dyslipidemia and post-menopausal state. Past treatments include calcium channel blockers and ACE inhibitors. The current treatment provides mild improvement. There is no history of a thyroid problem.  Hyperlipidemia This is a chronic problem. The current episode started more than 1 year ago. The problem is controlled. Recent lipid tests were reviewed and are normal. She has no history of diabetes. Pertinent negatives include no shortness of breath. Current antihyperlipidemic treatment includes statins. The current treatment provides moderate improvement of lipids. Risk factors for coronary artery disease include dyslipidemia and post-menopausal.  Carotid stenosis Had them checked in the hospital- having carotid U/s on February 20 Dr. Trula Slade   Review of Systems  Respiratory: Negative for shortness of breath.   Musculoskeletal: Positive for back pain.  All other systems reviewed and are negative.       Objective:   Physical Exam  Constitutional: She is oriented to person, place, and time. She appears well-developed and well-nourished.  HENT:  Head: Normocephalic.  Eyes: Pupils are equal, round, and reactive to light.  Neck: Normal range of motion. Neck supple. No thyromegaly present.  Cardiovascular: Normal rate.   Murmur heard. Carodid Bruits noted bil   Pulmonary/Chest: Effort normal.  Diminished breath sounds   Abdominal: Soft.  Bowel sounds are normal. There is no tenderness.  Musculoskeletal: Normal range of motion. She exhibits no edema.  Neurological: She is alert and oriented to person, place, and time.  Skin: Skin is warm and dry.  Psychiatric: She has a normal mood and affect. Her behavior is normal. Judgment and thought content normal.      BP 195/70  Pulse 60  Temp(Src) 97.2 F (36.2 C) (Oral)  Ht 5' 7" (1.702 m)  Wt 130 lb (58.968 kg)  BMI 20.36 kg/m2     Assessment & Plan:   1. Murmur   2. DYSLIPIDEMIA   3. CAROTID STENOSIS   4. Hypertension    Orders Placed This Encounter  Procedures  . CMP14+EGFR  . NMR, lipoprofile  . Thyroid Panel With TSH   Meds ordered this encounter  Medications  . carvedilol (COREG) 3.125 MG tablet    Sig: Take 1 tablet (3.125 mg total) by mouth 2 (two) times daily with a meal.    Dispense:  60 tablet    Refill:  5    Order Specific Question:  Supervising Provider    Answer:  Chipper Herb [[id number]]  . lisinopril (PRINIVIL,ZESTRIL) 40 MG tablet    Sig: Take 1 tablet (40 mg total) by mouth daily.    Dispense:  30 tablet    Refill:  5    Order Specific Question:  Supervising Provider    Answer:  Chipper Herb [[id number]]  . clopidogrel (PLAVIX) 75 MG tablet    Sig: Take 1 tablet (75 mg total) by mouth daily with breakfast.    Dispense:  30 tablet    Refill:  5  Order Specific Question:  Supervising Provider    Answer:  Chipper Herb [[id number]]  . atorvastatin (LIPITOR) 10 MG tablet    Sig: Take 1 tablet (10 mg total) by mouth daily at 6 PM.    Dispense:  30 tablet    Refill:  5    Order Specific Question:  Supervising Provider    Answer:  Chipper Herb [[id number]]  pneumonia vaccine today Schedule bone density test Labs pending Health maintenance reviewed Diet and exercise encouraged Continue all meds Follow up  In 3 months   Terra Bella, FNP

## 2013-09-19 NOTE — Patient Instructions (Signed)

## 2013-09-20 LAB — CMP14+EGFR
A/G RATIO: 2.1 (ref 1.1–2.5)
ALT: 13 IU/L (ref 0–32)
AST: 19 IU/L (ref 0–40)
Albumin: 4.1 g/dL (ref 3.5–4.7)
Alkaline Phosphatase: 77 IU/L (ref 39–117)
BUN/Creatinine Ratio: 14 (ref 11–26)
BUN: 14 mg/dL (ref 8–27)
CALCIUM: 9.1 mg/dL (ref 8.7–10.3)
CO2: 21 mmol/L (ref 18–29)
CREATININE: 0.99 mg/dL (ref 0.57–1.00)
Chloride: 102 mmol/L (ref 97–108)
GFR, EST AFRICAN AMERICAN: 62 mL/min/{1.73_m2} (ref >59)
GFR, EST NON AFRICAN AMERICAN: 54 mL/min/{1.73_m2} — AB (ref >59)
GLOBULIN, TOTAL: 2 g/dL (ref 1.5–4.5)
Glucose: 106 mg/dL — ABNORMAL HIGH (ref 65–99)
Potassium: 4.7 mmol/L (ref 3.5–5.2)
SODIUM: 138 mmol/L (ref 134–144)
Total Bilirubin: 0.5 mg/dL (ref 0.0–1.2)
Total Protein: 6.1 g/dL (ref 6.0–8.5)

## 2013-09-20 LAB — NMR, LIPOPROFILE
Cholesterol: 140 mg/dL (ref <200)
HDL CHOLESTEROL BY NMR: 56 mg/dL (ref >=40)
HDL Particle Number: 34.1 umol/L (ref >=30.5)
LDL Particle Number: 1066 nmol/L — ABNORMAL HIGH (ref <1000)
LDL Size: 20.7 nm (ref >20.5)
LDLC SERPL CALC-MCNC: 71 mg/dL (ref <100)
LP-IR Score: 31 (ref <=45)
SMALL LDL PARTICLE NUMBER: 395 nmol/L (ref <=527)
TRIGLYCERIDES BY NMR: 64 mg/dL (ref <150)

## 2013-09-20 LAB — THYROID PANEL WITH TSH
Free Thyroxine Index: 2.6 (ref 1.2–4.9)
T3 Uptake Ratio: 33 % (ref 24–39)
T4, Total: 7.8 ug/dL (ref 4.5–12.0)
TSH: 1.62 u[IU]/mL (ref 0.450–4.500)

## 2013-09-25 ENCOUNTER — Other Ambulatory Visit (HOSPITAL_COMMUNITY): Payer: Medicare Other

## 2013-09-25 ENCOUNTER — Ambulatory Visit: Payer: Medicare Other | Admitting: Surgery

## 2013-10-25 ENCOUNTER — Other Ambulatory Visit: Payer: Medicare Other

## 2013-10-25 ENCOUNTER — Ambulatory Visit: Payer: Medicare Other

## 2013-10-28 ENCOUNTER — Other Ambulatory Visit (HOSPITAL_COMMUNITY): Payer: Self-pay | Admitting: Internal Medicine

## 2013-10-30 ENCOUNTER — Other Ambulatory Visit (HOSPITAL_COMMUNITY): Payer: Medicare Other

## 2013-10-30 ENCOUNTER — Ambulatory Visit: Payer: Medicare Other | Admitting: Surgery

## 2013-11-06 ENCOUNTER — Ambulatory Visit: Payer: Medicare Other | Admitting: Family

## 2013-11-06 ENCOUNTER — Other Ambulatory Visit (HOSPITAL_COMMUNITY): Payer: Medicare Other

## 2013-11-10 ENCOUNTER — Encounter: Payer: Self-pay | Admitting: Internal Medicine

## 2013-11-10 ENCOUNTER — Ambulatory Visit (INDEPENDENT_AMBULATORY_CARE_PROVIDER_SITE_OTHER): Payer: Medicare Other | Admitting: Internal Medicine

## 2013-11-10 VITALS — BP 200/68 | HR 53 | Ht 67.0 in | Wt 140.0 lb

## 2013-11-10 DIAGNOSIS — R42 Dizziness and giddiness: Secondary | ICD-10-CM

## 2013-11-10 DIAGNOSIS — R002 Palpitations: Secondary | ICD-10-CM

## 2013-11-10 DIAGNOSIS — R011 Cardiac murmur, unspecified: Secondary | ICD-10-CM

## 2013-11-10 MED ORDER — AMLODIPINE BESYLATE 10 MG PO TABS: 10.0000 mg | ORAL_TABLET | Freq: Every day | ORAL | Status: DC

## 2013-11-10 MED ORDER — CARVEDILOL 3.125 MG PO TABS: 3.1250 mg | ORAL_TABLET | Freq: Every day | ORAL | Status: DC

## 2013-11-10 NOTE — Progress Notes (Signed)
History of Present Illness: Patient is a 80 yo with a history of HTN, CV disease, HL, macular degeneration.  She is also followed by Dr Myra GianottiBrabham  I saw her in clinic back in the fall SInce seen she denies CP  Breathing is OK  Continues to have back problems.    Current Outpatient Prescriptions  Medication Sig Dispense Refill  . aspirin 81 MG tablet Take 81 mg by mouth daily.        Marland Kitchen. atorvastatin (LIPITOR) 10 MG tablet Take 1 tablet (10 mg total) by mouth daily at 6 PM.  30 tablet  5  . Calcium Citrate (CALCITRATE PO) Take 1 tablet by mouth daily.       . carvedilol (COREG) 3.125 MG tablet Take 1 tablet (3.125 mg total) by mouth 2 (two) times daily with a meal.  60 tablet  5  . cholecalciferol (VITAMIN D) 1000 UNITS tablet Take 1,000 Units by mouth daily.      . clopidogrel (PLAVIX) 75 MG tablet Take 1 tablet (75 mg total) by mouth daily with breakfast.  30 tablet  5  . hydrALAZINE (APRESOLINE) 25 MG tablet Take 1.5 tablets (37.5 mg total) by mouth 2 (two) times daily.  135 tablet  3  . ibuprofen (ADVIL,MOTRIN) 200 MG tablet Take 200 mg by mouth every 6 (six) hours as needed for mild pain.       Marland Kitchen. lisinopril (PRINIVIL,ZESTRIL) 40 MG tablet Take 1 tablet (40 mg total) by mouth daily.  30 tablet  5  . Multiple Vitamins-Minerals (PRESERVISION AREDS) CAPS Take 1 capsule by mouth daily.        No current facility-administered medications for this visit.    Allergies  Allergen Reactions  . Penicillins Rash    Oral swelling  . Clonidine Derivatives     Slow HR/fatigue   . Maxzide [Hydrochlorothiazide W-Triamterene]     hyponatremia    Past Medical History  Diagnosis Date  . Hypertension     a. Intolerant to Maxzide due to hyponatremia. b. intolerant to Clonidine due to slow HR/fatigue. c. Discrepancy in BP felt due to innominant stenosis.  . Hyperlipidemia   . Carotid artery occlusion     RICA 80-99%, LICA 40-59% by dopplers 06/2013. Also with cerebrovascular disease otherwise on MRA  06/2013.  . Macular degeneration     In both eyes - no longer drives.  . Arthritis   . Hiatal hernia   . Chronic kidney disease   . DVT (deep venous thrombosis)     a. Around age 80, details unclear (SVT vs DVT?), s/p vein stripping without recurrence.  . Anemia     a. "On/off my whole life" - etiology unclear. Has never required blood transfusion.  . Multiple thyroid nodules     a. By US 06/2013. Bx recommended.  Marland Kitchen. PFO (patent foramen ovale)     a. Per echo in 12/2010.  Marland Kitchen. PVD (peripheral vascular disease)     a. R innominant stenosis. b. 06/2013 ABI: 0.80 R, 0.50 L.  . Dizziness     a. Adm 06/2013: felt due to posterior circulation deficits from vertebrobasilar insufficiency and possibly vertigo.  Marland Kitchen. Heart murmur     a. Longstanding. mild MR on prior ECG, also may be in setting of known vasc dz.    Past Surgical History  Procedure Laterality Date  . Cataract extraction    . Lumbar disc surgery    . Incontinence surgery    . Abdominal hysterectomy  1971  . Neck muscle surgery    . Varicose vein surgery      left leg  . Eye surgery    . Cholecystectomy  2004    Gall Bladder sling    History  Smoking status  . Former Smoker  . Quit date: 08/24/2009  Smokeless tobacco  . Never Used    History  Alcohol Use No    Family History  Problem Relation Age of Onset  . Cancer Mother     [name]  . Diabetes Sister   . Cancer Sister   . Hyperlipidemia Sister   . Heart attack Sister   . Diabetes Son   . Hyperlipidemia Daughter     Review of Systems: The review of systems is per the HPI.  All other systems were reviewed and are negative.  Physical Exam: BP 200/68  P 53  Wt 140    Patient is very pleasant and in no acute distress.  l.  HEENT is unremarkable. Normocephalic/atraumatic. PERRL. Sclera are nonicteric.  Neck  No JVD.  Lungs are clear.  Cardiac exam shows a regular rate and rhythm.  II/VI systolic murmur . Abdomen is soft.  Extremities are without edema.  1+ PT Neuro. No gross neurologic deficits noted.  LABORATORY DATA:  Lab Results  Component Value Date   WBC 4.9 07/15/2013   HGB 8.4* 07/15/2013   HCT 24.7* 07/15/2013   PLT 216 07/15/2013   GLUCOSE 106* 09/19/2013   CHOL 140 09/19/2013   TRIG 65 02/17/2013   LDLCALC 62 02/17/2013   ALT 13 09/19/2013   AST 19 09/19/2013   NA 138 09/19/2013   K 4.7 09/19/2013   CL 102 09/19/2013   CREATININE 0.99 09/19/2013   BUN 14 09/19/2013   CO2 21 09/19/2013   TSH 1.620 09/19/2013   INR 0.9 04/21/2007   CT CHEST ANGIO IMPRESSION FROM October 2012:  1. Severe atherosclerosis involving the thoracic and upper abdominal aorta without aneurysm or dissection. 2. Calcified plaque at the origin of the innominate artery causing moderate to high-grade stenosis. 3. Calcified plaque at the origin of the left common carotid artery causing mild stenosis. 4. Calcified plaque at the origin of the left subclavian artery causing mild to moderate stenosis. 5. Calcified plaque at the origin of the right vertebral artery causing mild to moderate stenosis. 6. Widely patent left vertebral artery origin. 7. Mild to moderate stenoses involving both main renal artery due to calcified plaque. 8. Calcified plaque at the origins of the celiac and SMA without significant stenosis. 9. Cardiomegaly with left adrenal hypertrophy. Extensive three- vessel coronary calcification. 10. COPD/emphysema. Calcified granuloma in the left lung apex. No acute cardiopulmonary disease. 11. Goiter. Approximate 1.9 cm nodule in the lower pole of the left lobe of the thyroid gland.  Original Report Authenticated By: Arnell Sieving, M.D.     EKG  SB 53 bpm. LVH with repol abnorm    Assessment / Plan: 1. HTN -  BP is very high  WIll switch Coreg qd and increase Amlodipine 5 bid   F/u 1 month  2. HLD - on statin therapy  3. PVD - followed by Dr. Myra Gianotti for CV disease. Will set up for LE dopplers.

## 2013-11-10 NOTE — Patient Instructions (Signed)
Your physician has recommended you make the following change in your medication:   DECREASE COREG 3.125MG  TO ONCE DAILY START AMLODIPINE 10MG  TABLETS---TAKE 5 MG BY MOUTH TWICE A DAY.  Your physician recommends that you schedule a follow-up appointment in: 4 WEEKS WITH DR ROSS.

## 2013-11-16 ENCOUNTER — Encounter (INDEPENDENT_AMBULATORY_CARE_PROVIDER_SITE_OTHER): Payer: Medicare Other | Admitting: Ophthalmology

## 2013-11-16 DIAGNOSIS — H35059 Retinal neovascularization, unspecified, unspecified eye: Secondary | ICD-10-CM

## 2013-11-16 DIAGNOSIS — H43819 Vitreous degeneration, unspecified eye: Secondary | ICD-10-CM

## 2013-11-16 DIAGNOSIS — I10 Essential (primary) hypertension: Secondary | ICD-10-CM

## 2013-11-16 DIAGNOSIS — H35039 Hypertensive retinopathy, unspecified eye: Secondary | ICD-10-CM

## 2013-11-16 DIAGNOSIS — H3530 Unspecified macular degeneration: Secondary | ICD-10-CM

## 2013-11-22 ENCOUNTER — Ambulatory Visit (INDEPENDENT_AMBULATORY_CARE_PROVIDER_SITE_OTHER): Payer: Medicare Other

## 2013-11-22 ENCOUNTER — Encounter: Payer: Self-pay | Admitting: Pharmacist

## 2013-11-22 ENCOUNTER — Ambulatory Visit (INDEPENDENT_AMBULATORY_CARE_PROVIDER_SITE_OTHER): Payer: Medicare Other | Admitting: Pharmacist

## 2013-11-22 VITALS — BP 150/70 | HR 64 | Ht 67.0 in | Wt 139.0 lb

## 2013-11-22 DIAGNOSIS — M8580 Other specified disorders of bone density and structure, unspecified site: Secondary | ICD-10-CM | POA: Insufficient documentation

## 2013-11-22 DIAGNOSIS — Z13820 Encounter for screening for osteoporosis: Secondary | ICD-10-CM

## 2013-11-22 DIAGNOSIS — Z0000 Encounter for general adult medical examination without abnormal findings: Secondary | ICD-10-CM

## 2013-11-22 DIAGNOSIS — M899 Disorder of bone, unspecified: Secondary | ICD-10-CM

## 2013-11-22 DIAGNOSIS — M949 Disorder of cartilage, unspecified: Secondary | ICD-10-CM

## 2013-11-22 MED ORDER — ALENDRONATE SODIUM 70 MG PO TBEF: EFFERVESCENT_TABLET | ORAL | Status: DC

## 2013-11-22 NOTE — Patient Instructions (Addendum)
Get [facility] eye multivitamin  Health Maintenance Summary    INFLUENZA VACCINE Next Due 03/24/2014  Last done 05/29/2013    COLONOSCOPY Next Due 04/24/2014  Last done 04/24/2004   Pneumonia Vaccine completed  Last done 09/19/2013   Shingles / Zostavax Completed  Last done 02/22/2012   DEXA Next due 11/23/2015  Last done 11/22/2013    TETANUS/TDAP Next Due 05/24/2021  Last done 05/25/2011        Fall Prevention and Home Safety Falls cause injuries and can affect all age groups. It is possible to use preventive measures to significantly decrease the likelihood of falls. There are many simple measures which can make your home safer and prevent falls. OUTDOORS  Repair cracks and edges of walkways and driveways.  Remove high doorway thresholds.  Trim shrubbery on the main path into your home.  Have good outside lighting.  Clear walkways of tools, rocks, debris, and clutter.  Check that handrails are not broken and are securely fastened. Both sides of steps should have handrails.  Have leaves, snow, and ice cleared regularly.  Use sand or salt on walkways during winter months.  In the garage, clean up grease or oil spills. BATHROOM  Install night lights.  Install grab bars by the toilet and in the tub and shower.  Use non-skid mats or decals in the tub or shower.  Place a plastic non-slip stool in the shower to sit on, if needed.  Keep floors dry and clean up all water on the floor immediately.  Remove soap buildup in the tub or shower on a regular basis.  Secure bath mats with non-slip, double-sided rug tape.  Remove throw rugs and tripping hazards from the floors. BEDROOMS  Install night lights.  Make sure a bedside light is easy to reach.  Do not use oversized bedding.  Keep a telephone by your bedside.  Have a firm chair with side arms to use for getting dressed.  Remove throw rugs and tripping hazards from the floor. KITCHEN  Keep handles on pots and pans  turned toward the center of the stove. Use back burners when possible.  Clean up spills quickly and allow time for drying.  Avoid walking on wet floors.  Avoid hot utensils and knives.  Position shelves so they are not too high or low.  Place commonly used objects within easy reach.  If necessary, use a sturdy step stool with a grab bar when reaching.  Keep electrical cables out of the way.  Do not use floor polish or wax that makes floors slippery. If you must use wax, use non-skid floor wax.  Remove throw rugs and tripping hazards from the floor. STAIRWAYS  Never leave objects on stairs.  Place handrails on both sides of stairways and use them. Fix any loose handrails. Make sure handrails on both sides of the stairways are as long as the stairs.  Check carpeting to make sure it is firmly attached along stairs. Make repairs to worn or loose carpet promptly.  Avoid placing throw rugs at the top or bottom of stairways, or properly secure the rug with carpet tape to prevent slippage. Get rid of throw rugs, if possible.  Have an electrician put in a light switch at the top and bottom of the stairs. OTHER FALL PREVENTION TIPS  Wear low-heel or rubber-soled shoes that are supportive and fit well. Wear closed toe shoes.  When using a stepladder, make sure it is fully opened and both spreaders are firmly locked.  Do not climb a closed stepladder.  Add color or contrast paint or tape to grab bars and handrails in your home. Place contrasting color strips on first and last steps.  Learn and use mobility aids as needed. Install an electrical emergency response system.  Turn on lights to avoid dark areas. Replace light bulbs that burn out immediately. Get light switches that glow.  Arrange furniture to create clear pathways. Keep furniture in the same place.  Firmly attach carpet with non-skid or double-sided tape.  Eliminate uneven floor surfaces.  Select a carpet pattern that  does not visually hide the edge of steps.  Be aware of all pets. OTHER HOME SAFETY TIPS  Set the water temperature for 120 F (48.8 C).  Keep emergency numbers on or near the telephone.  Keep smoke detectors on every level of the home and near sleeping areas. Document Released: 07/31/2002 Document Revised: 02/09/2012 Document Reviewed: 10/30/2011 [facility] Patient Information 2014 [place].   Preventive Care for Adults, Female A healthy lifestyle and preventive care can promote health and wellness. Preventive health guidelines for women include the following key practices.  A routine yearly physical is a good way to check with your health care provider about your health and preventive screening. It is a chance to share any concerns and updates on your health and to receive a thorough exam.  Visit your dentist for a routine exam and preventive care every 6 months. Brush your teeth twice a day and floss once a day. Good oral hygiene prevents tooth decay and gum disease.  The frequency of eye exams is based on your age, health, family medical history, use of contact lenses, and other factors. Follow your health care provider's recommendations for frequency of eye exams.  Eat a healthy diet. Foods like vegetables, fruits, whole grains, low-fat dairy products, and lean protein foods contain the nutrients you need without too many calories. Decrease your intake of foods high in solid fats, added sugars, and salt. Eat the right amount of calories for you.Get information about a proper diet from your health care provider, if necessary.  Regular physical exercise is one of the most important things you can do for your health. Most adults should get at least 150 minutes of moderate-intensity exercise (any activity that increases your heart rate and causes you to sweat) each week. In addition, most adults need muscle-strengthening exercises on 2 or more days a week.  Maintain a healthy  weight. The body mass index (BMI) is a screening tool to identify possible weight problems. It provides an estimate of body fat based on height and weight. Your health care provider can find your BMI, and can help you achieve or maintain a healthy weight.For adults 20 years and older:  A BMI below 18.5 is considered underweight.  A BMI of 18.5 to 24.9 is normal.  A BMI of 25 to 29.9 is considered overweight.  A BMI of 30 and above is considered obese.  Maintain normal blood lipids and cholesterol levels by exercising and minimizing your intake of saturated fat. Eat a balanced diet with plenty of fruit and vegetables. Blood tests for lipids and cholesterol should begin at age 52 and be repeated every 5 years. If your lipid or cholesterol levels are high, you are over 50, or you are at high risk for heart disease, you may need your cholesterol levels checked more frequently.Ongoing high lipid and cholesterol levels should be treated with medicines if diet and exercise are not working.  If you smoke, find out from your health care provider how to quit. If you do not use tobacco, do not start.  Lung cancer screening is recommended for adults aged 54 80 years who are at high risk for developing lung cancer because of a history of smoking. A yearly low-dose CT scan of the lungs is recommended for people who have at least a 30-pack-year history of smoking and are a current smoker or have quit within the past 15 years. A pack year of smoking is smoking an average of 1 pack of cigarettes a day for 1 year (for example: 1 pack a day for 30 years or 2 packs a day for 15 years). Yearly screening should continue until the smoker has stopped smoking for at least 15 years. Yearly screening should be stopped for people who develop a health problem that would prevent them from having lung cancer treatment.  If you are pregnant, do not drink alcohol. If you are breastfeeding, be very cautious about drinking alcohol.  If you are not pregnant and choose to drink alcohol, do not have more than 1 drink per day. One drink is considered to be 12 ounces (355 mL) of beer, 5 ounces (148 mL) of wine, or 1.5 ounces (44 mL) of liquor.  Avoid use of street drugs. Do not share needles with anyone. Ask for help if you need support or instructions about stopping the use of drugs.  High blood pressure causes heart disease and increases the risk of stroke. Your blood pressure should be checked at least every 1 to 2 years. Ongoing high blood pressure should be treated with medicines if weight loss and exercise do not work.  If you are 56 78 years old, ask your health care provider if you should take aspirin to prevent strokes.  Diabetes screening involves taking a blood sample to check your fasting blood sugar level. This should be done once every 3 years, after age 72, if you are within normal weight and without risk factors for diabetes. Testing should be considered at a younger age or be carried out more frequently if you are overweight and have at least 1 risk factor for diabetes.  Breast cancer screening is essential preventive care for women. You should practice "breast self-awareness." This means understanding the normal appearance and feel of your breasts and may include breast self-examination. Any changes detected, no matter how small, should be reported to a health care provider. Women in their 58s and 30s should have a clinical breast exam (CBE) by a health care provider as part of a regular health exam every 1 to 3 years. After age 60, women should have a CBE every year. Starting at age 49, women should consider having a mammogram (breast X-ray test) every year. Women who have a family history of breast cancer should talk to their health care provider about genetic screening. Women at a high risk of breast cancer should talk to their health care providers about having an MRI and a mammogram every year.  Breast cancer gene  (BRCA)-related cancer risk assessment is recommended for women who have family members with BRCA-related cancers. BRCA-related cancers include breast, ovarian, tubal, and peritoneal cancers. Having family members with these cancers may be associated with an increased risk for harmful changes (mutations) in the breast cancer genes BRCA1 and BRCA2. Results of the assessment will determine the need for genetic counseling and BRCA1 and BRCA2 testing.  The Pap test is a screening test for cervical cancer.  A Pap test can show cell changes on the cervix that might become cervical cancer if left untreated. A Pap test is a procedure in which cells are obtained and examined from the lower end of the uterus (cervix).  Women should have a Pap test starting at age 12.  Between ages 8 and 58, Pap tests should be repeated every 2 years.  Beginning at age 91, you should have a Pap test every 3 years as long as the past 3 Pap tests have been normal.  Some women have medical problems that increase the chance of getting cervical cancer. Talk to your health care provider about these problems. It is especially important to talk to your health care provider if a new problem develops soon after your last Pap test. In these cases, your health care provider may recommend more frequent screening and Pap tests.  The above recommendations are the same for women who have or have not gotten the vaccine for human papillomavirus (HPV).  If you had a hysterectomy for a problem that was not cancer or a condition that could lead to cancer, then you no longer need Pap tests. Even if you no longer need a Pap test, a regular exam is a good idea to make sure no other problems are starting.  If you are between ages 27 and 64 years, and you have had normal Pap tests going back 10 years, you no longer need Pap tests. Even if you no longer need a Pap test, a regular exam is a good idea to make sure no other problems are starting.  If you  have had past treatment for cervical cancer or a condition that could lead to cancer, you need Pap tests and screening for cancer for at least 20 years after your treatment.  If Pap tests have been discontinued, risk factors (such as a new sexual partner) need to be reassessed to determine if screening should be resumed.  The HPV test is an additional test that may be used for cervical cancer screening. The HPV test looks for the virus that can cause the cell changes on the cervix. The cells collected during the Pap test can be tested for HPV. The HPV test could be used to screen women aged 32 years and older, and should be used in women of any age who have unclear Pap test results. After the age of 66, women should have HPV testing at the same frequency as a Pap test.  Colorectal cancer can be detected and often prevented. Most routine colorectal cancer screening begins at the age of 88 years and continues through age 66 years. However, your health care provider may recommend screening at an earlier age if you have risk factors for colon cancer. On a yearly basis, your health care provider may provide home test kits to check for hidden blood in the stool. Use of a small camera at the end of a tube, to directly examine the colon (sigmoidoscopy or colonoscopy), can detect the earliest forms of colorectal cancer. Talk to your health care provider about this at age 69, when routine screening begins. Direct exam of the colon should be repeated every 5 10 years through age 102 years, unless early forms of pre-cancerous polyps or small growths are found.  People who are at an increased risk for hepatitis B should be screened for this virus. You are considered at high risk for hepatitis B if:  You were born in a country where hepatitis B occurs  often. Talk with your health care provider about which countries are considered high risk.  Your parents were born in a high-risk country and you have not received a  shot to protect against hepatitis B (hepatitis B vaccine).  You have HIV or AIDS.  You use needles to inject street drugs.  You live with, or have sex with, someone who has Hepatitis B.  You get hemodialysis treatment.  You take certain medicines for conditions like cancer, organ transplantation, and autoimmune conditions.  Hepatitis C blood testing is recommended for all people born from [birth year] through 1963 and any individual with known risks for hepatitis C.  Practice safe sex. Use condoms and avoid high-risk sexual practices to reduce the spread of sexually transmitted infections (STIs). STIs include gonorrhea, chlamydia, syphilis, trichomonas, herpes, HPV, and human immunodeficiency virus (HIV). Herpes, HIV, and HPV are viral illnesses that have no cure. They can result in disability, cancer, and death. Sexually active women aged 71 years and younger should be checked for chlamydia. Older women with new or multiple partners should also be tested for chlamydia. Testing for other STIs is recommended if you are sexually active and at increased risk.  Osteoporosis is a disease in which the bones lose minerals and strength with aging. This can result in serious bone fractures or breaks. The risk of osteoporosis can be identified using a bone density scan. Women ages 70 years and over and women at risk for fractures or osteoporosis should discuss screening with their health care providers. Ask your health care provider whether you should take a calcium supplement or vitamin D to reduce the rate of osteoporosis.  Menopause can be associated with physical symptoms and risks. Hormone replacement therapy is available to decrease symptoms and risks. You should talk to your health care provider about whether hormone replacement therapy is right for you.  Use sunscreen. Apply sunscreen liberally and repeatedly throughout the day. You should seek shade when your shadow is shorter than you. Protect yourself  by wearing long sleeves, pants, a wide-brimmed hat, and sunglasses year round, whenever you are outdoors.  Once a month, do a whole body skin exam, using a mirror to look at the skin on your back. Tell your health care provider of new moles, moles that have irregular borders, moles that are larger than a pencil eraser, or moles that have changed in shape or color.  Stay current with required vaccines (immunizations).  Influenza vaccine. All adults should be immunized every year.  Tetanus, diphtheria, and acellular pertussis (Td, Tdap) vaccine. Pregnant women should receive 1 dose of Tdap vaccine during each pregnancy. The dose should be obtained regardless of the length of time since the last dose. Immunization is preferred during the 27th 36th week of gestation. An adult who has not previously received Tdap or who does not know her vaccine status should receive 1 dose of Tdap. This initial dose should be followed by tetanus and diphtheria toxoids (Td) booster doses every 10 years. Adults with an unknown or incomplete history of completing a 3-dose immunization series with Td-containing vaccines should begin or complete a primary immunization series including a Tdap dose. Adults should receive a Td booster every 10 years.  Varicella vaccine. An adult without evidence of immunity to varicella should receive 2 doses or a second dose if she has previously received 1 dose. Pregnant females who do not have evidence of immunity should receive the first dose after pregnancy. This first dose should be obtained before leaving the  health care facility. The second dose should be obtained 4 8 weeks after the first dose.  Human papillomavirus (HPV) vaccine. Females aged 61 26 years who have not received the vaccine previously should obtain the 3-dose series. The vaccine is not recommended for use in pregnant females. However, pregnancy testing is not needed before receiving a dose. If a female is found to be pregnant  after receiving a dose, no treatment is needed. In that case, the remaining doses should be delayed until after the pregnancy. Immunization is recommended for any person with an immunocompromised condition through the age of 63 years if she did not get any or all doses earlier. During the 3-dose series, the second dose should be obtained 4 8 weeks after the first dose. The third dose should be obtained 24 weeks after the first dose and 16 weeks after the second dose.  Zoster vaccine. One dose is recommended for adults aged 17 years or older unless certain conditions are present.  Measles, mumps, and rubella (MMR) vaccine. Adults born before [birth year] generally are considered immune to measles and mumps. Adults born in [birth year] or later should have 1 or more doses of MMR vaccine unless there is a contraindication to the vaccine or there is laboratory evidence of immunity to each of the three diseases. A routine second dose of MMR vaccine should be obtained at least 28 days after the first dose for students attending postsecondary schools, health care workers, or international travelers. People who received inactivated measles vaccine or an unknown type of measles vaccine during 1963 1967 should receive 2 doses of MMR vaccine. People who received inactivated mumps vaccine or an unknown type of mumps vaccine before 1979 and are at high risk for mumps infection should consider immunization with 2 doses of MMR vaccine. For females of childbearing age, rubella immunity should be determined. If there is no evidence of immunity, females who are not pregnant should be vaccinated. If there is no evidence of immunity, females who are pregnant should delay immunization until after pregnancy. Unvaccinated health care workers born before [birth year] who lack laboratory evidence of measles, mumps, or rubella immunity or laboratory confirmation of disease should consider measles and mumps immunization with 2 doses of MMR vaccine or rubella  immunization with 1 dose of MMR vaccine.  Pneumococcal 13-valent conjugate (PCV13) vaccine. When indicated, a person who is uncertain of her immunization history and has no record of immunization should receive the PCV13 vaccine. An adult aged 2 years or older who has certain medical conditions and has not been previously immunized should receive 1 dose of PCV13 vaccine. This PCV13 should be followed with a dose of pneumococcal polysaccharide (PPSV23) vaccine. The PPSV23 vaccine dose should be obtained at least 8 weeks after the dose of PCV13 vaccine. An adult aged 53 years or older who has certain medical conditions and previously received 1 or more doses of PPSV23 vaccine should receive 1 dose of PCV13. The PCV13 vaccine dose should be obtained 1 or more years after the last PPSV23 vaccine dose.  Pneumococcal polysaccharide (PPSV23) vaccine. When PCV13 is also indicated, PCV13 should be obtained first. All adults aged 61 years and older should be immunized. An adult younger than age 66 years who has certain medical conditions should be immunized. Any person who resides in a nursing home or long-term care facility should be immunized. An adult smoker should be immunized. People with an immunocompromised condition and certain other conditions should receive both PCV13 and PPSV23 vaccines. People  with human immunodeficiency virus (HIV) infection should be immunized as soon as possible after diagnosis. Immunization during chemotherapy or radiation therapy should be avoided. Routine use of PPSV23 vaccine is not recommended for American Indians, [place] Natives, or people younger than 65 years unless there are medical conditions that require PPSV23 vaccine. When indicated, people who have unknown immunization and have no record of immunization should receive PPSV23 vaccine. One-time revaccination 5 years after the first dose of PPSV23 is recommended for people aged 19 64 years who have chronic kidney failure,  nephrotic syndrome, asplenia, or immunocompromised conditions. People who received 1 2 doses of PPSV23 before age 22 years should receive another dose of PPSV23 vaccine at age 63 years or later if at least 5 years have passed since the previous dose. Doses of PPSV23 are not needed for people immunized with PPSV23 at or after age 69 years.  Meningococcal vaccine. Adults with asplenia or persistent complement component deficiencies should receive 2 doses of quadrivalent meningococcal conjugate (MenACWY-D) vaccine. The doses should be obtained at least 2 months apart. Microbiologists working with certain meningococcal bacteria, [address] recruits, people at risk during an outbreak, and people who travel to or live in countries with a high rate of meningitis should be immunized. A first-year college student up through age 34 years who is living in a residence hall should receive a dose if she did not receive a dose on or after her 16th birthday. Adults who have certain high-risk conditions should receive one or more doses of vaccine.  Hepatitis A vaccine. Adults who wish to be protected from this disease, have certain high-risk conditions, work with hepatitis A-infected animals, work in hepatitis A research labs, or travel to or work in countries with a high rate of hepatitis A should be immunized. Adults who were previously unvaccinated and who anticipate close contact with an international adoptee during the first 60 days after arrival in the [place] from a country with a high rate of hepatitis A should be immunized.  Hepatitis B vaccine. Adults who wish to be protected from this disease, have certain high-risk conditions, may be exposed to blood or other infectious body fluids, are household contacts or sex partners of hepatitis B positive people, are clients or workers in certain care facilities, or travel to or work in countries with a high rate of hepatitis B should be immunized.  Haemophilus  influenzae type b (Hib) vaccine. A previously unvaccinated person with asplenia or sickle cell disease or having a scheduled splenectomy should receive 1 dose of Hib vaccine. Regardless of previous immunization, a recipient of a hematopoietic stem cell transplant should receive a 3-dose series 6 12 months after her successful transplant. Hib vaccine is not recommended for adults with HIV infection.

## 2013-11-22 NOTE — Progress Notes (Signed)
Patient ID: [name], female   DOB: [birth year], 80 y.o.   MRN: [id number] Subjective:    [name] is a 80 y.o. female who presents for Medicare Initial preventive examination.  Preventive Screening-Counseling & Management  Tobacco History  Smoking status  . Former Smoker  . Quit date: 08/24/2009  Smokeless tobacco  . Never Used      Current Problems (verified) Patient Active Problem List   Diagnosis Date Noted  . Osteopenia, senile 11/22/2013  . Heart palpitations 07/14/2013  . Burning sensation of left arm 07/14/2013  . Arm pain 07/14/2013  . Dizziness 09/12/2012  . Murmur 12/16/2010  . DYSLIPIDEMIA 07/17/2009  . HYPERTENSION 07/17/2009  . CAROTID STENOSIS 06/21/2009    Medications Prior to Visit Current Outpatient Prescriptions on File Prior to Visit  Medication Sig Dispense Refill  . amLODipine (NORVASC) 10 MG tablet Take 1 tablet (10 mg total) by mouth daily. Take 1/2 tablet (5mg ) twice a day  180 tablet  3  . aspirin 81 MG tablet Take 81 mg by mouth daily.        Marland Kitchen atorvastatin (LIPITOR) 10 MG tablet Take 1 tablet (10 mg total) by mouth daily at 6 PM.  30 tablet  5  . Calcium Citrate (CALCITRATE PO) Take 1 tablet by mouth daily.       . carvedilol (COREG) 3.125 MG tablet Take 1 tablet (3.125 mg total) by mouth daily.  90 tablet  3  . clopidogrel (PLAVIX) 75 MG tablet Take 1 tablet (75 mg total) by mouth daily with breakfast.  30 tablet  5  . hydrALAZINE (APRESOLINE) 25 MG tablet Take 1.5 tablets (37.5 mg total) by mouth 2 (two) times daily.  135 tablet  3  . ibuprofen (ADVIL,MOTRIN) 200 MG tablet Take 200 mg by mouth every 6 (six) hours as needed for mild pain.       Marland Kitchen lisinopril (PRINIVIL,ZESTRIL) 40 MG tablet Take 1 tablet (40 mg total) by mouth daily.  30 tablet  5  . Multiple Vitamins-Minerals (PRESERVISION AREDS) CAPS Take 1 capsule by mouth daily.        No current facility-administered medications on file prior to visit.    Current Medications  (verified) Current Outpatient Prescriptions  Medication Sig Dispense Refill  . amLODipine (NORVASC) 10 MG tablet Take 1 tablet (10 mg total) by mouth daily. Take 1/2 tablet (5mg ) twice a day  180 tablet  3  . aspirin 81 MG tablet Take 81 mg by mouth daily.        Marland Kitchen atorvastatin (LIPITOR) 10 MG tablet Take 1 tablet (10 mg total) by mouth daily at 6 PM.  30 tablet  5  . Calcium Citrate (CALCITRATE PO) Take 1 tablet by mouth daily.       . carvedilol (COREG) 3.125 MG tablet Take 1 tablet (3.125 mg total) by mouth daily.  90 tablet  3  . clopidogrel (PLAVIX) 75 MG tablet Take 1 tablet (75 mg total) by mouth daily with breakfast.  30 tablet  5  . hydrALAZINE (APRESOLINE) 25 MG tablet Take 1.5 tablets (37.5 mg total) by mouth 2 (two) times daily.  135 tablet  3  . ibuprofen (ADVIL,MOTRIN) 200 MG tablet Take 200 mg by mouth every 6 (six) hours as needed for mild pain.       Marland Kitchen lisinopril (PRINIVIL,ZESTRIL) 40 MG tablet Take 1 tablet (40 mg total) by mouth daily.  30 tablet  5  . Multiple Vitamins-Minerals (PRESERVISION AREDS) CAPS Take 1  capsule by mouth daily.        No current facility-administered medications for this visit.     Allergies (verified) Penicillins; Clonidine derivatives; and Maxzide   PAST HISTORY  Family History Family History  Problem Relation Age of Onset  . Cancer Mother     Raj JanusUterian  . Diabetes Sister   . Cancer Sister   . Hyperlipidemia Sister   . Heart attack Sister   . Diabetes Son   . Hyperlipidemia Daughter     Social History History  Substance Use Topics  . Smoking status: Former Smoker    Quit date: 08/24/2009  . Smokeless tobacco: Never Used  . Alcohol Use: No     Are there smokers in your home (other than you)? No - patient quite about 1 year ago  Risk Factors Current exercise habits: The patient does not participate in regular exercise at present.  Dietary issues discussed: none   Cardiac risk factors: advanced age (older than 3655 for men, 4265  for women), dyslipidemia, hypertension and sedentary lifestyle.  Depression Screen (Note: if answer to either of the following is "Yes", a more complete depression screening is indicated)   Over the past 2 weeks, have you felt down, depressed or hopeless? No  Over the past 2 weeks, have you felt little interest or pleasure in doing things? No  Have you lost interest or pleasure in daily life? No  Do you often feel hopeless? No  Do you cry easily over simple problems? No  Activities of Daily Living In your present state of health, do you have any difficulty performing the following activities?:  Driving? Yes - does not drive  Managing money?  No Feeding yourself? No Getting from bed to chair? No  Climbing a flight of stairs? No Preparing food and eating?: Yes - due to memory changes she does not cook for herself. Bathing or showering? No Getting dressed: No Getting to the toilet? No Using the toilet:No Moving around from place to place: No In the past year have you fallen or had a near fall?:No   Are you sexually active?  No  Do you have more than one partner?  No  Hearing Difficulties: Yes Do you often ask people to speak up or repeat themselves? Yes Do you experience ringing or noises in your ears? No Do you have difficulty understanding soft or whispered voices? Yes   Do you feel that you have a problem with memory? Yes  Do you often misplace items? No  Do you feel safe at home?  Yes  Cognitive Testing  Alert? Yes  Normal Appearance?Yes  Oriented to person? Yes  Place? Yes   Time? Yes  Recall of three objects?  Yes  Can perform simple calculations? Yes  Displays appropriate judgment?Yes  Can read the correct time from a watch face?Yes   Advanced Directives have been discussed with the patient? Yes  List the Names of Other Physician/Practitioners you currently use: 1.  Dietrich Patesoss, Paula - cardiologist 2.  Coral ElseBrabham, Vance - Vascular surgeon 3.  Barnett AbuElsner, Henry or Delia HeadySethi,  Pramod- Neurosurgeon 4.  Alan MulderMatthews, John - Opthamologist  Indicate any recent Medical Services you may have received from other than [facility] providers in the past year (date may be approximate).  Immunization History  Administered Date(s) Administered  . Influenza-Unspecified 05/29/2013  . Pneumococcal Conjugate-13 09/19/2013    Screening Tests Health Maintenance  Topic Date Due  . Influenza Vaccine  03/24/2014  . Colonoscopy  04/24/2014  .  Tetanus/tdap  05/24/2021  . Pneumococcal Polysaccharide Vaccine Age 52 And Over  Completed  . Zostavax  Completed    All answers were reviewed with the patient and necessary referrals were made:  Henrene Pastor, [facility]   11/22/2013   History reviewed: allergies, current medications, past family history, past medical history, past social history, past surgical history and problem list  Objective:       Body mass index is 21.77 kg/(m^2). BP 150/70  Pulse 64  Ht 5\' 7"  (1.702 m)  Wt 139 lb (63.05 kg)  BMI 21.77 kg/m2  DEXA Results Date of Test T-Score for AP Spine L1-L4 T-Score for Total Left Hip T-Score for Total Right Hip  11/22/2013 0.7 -1.7 -1.3  12/19/2008 0.3 -1.1 -0.7  07/05/2006 0.0 -0.9 -0.6        FRAX 10 year estimate: Total FX risk:  14%  (consider medication if >/= 20%) Hip FX risk:  5.6%  (consider medication if >/= 3%)      Assessment:     Annual Wellness Visit Hard of Hearing Osteoporosis     Plan:     During the course of the visit the patient was educated and counseled about appropriate screening and preventive services including:    Pneumococcal vaccine   Influenza vaccine  Hepatitis B vaccine  Td vaccine  Screening mammography  Screening Pap smear and pelvic exam   Bone densitometry screening  Colorectal cancer screening  Diabetes screening  Glaucoma screening  Nutrition counseling   Advanced directives: discussed with patient and her daughter - information from [facility]  given  _ Start Binosto - dissolve 1 tablet in at least 4 ounces of plain water and drink once weekly for bones (gave #4 samples to try) Recommend calcium 1200mg  daily through supplementation or diet.  Recommend weight bearing exercise - 30 minutes at least 4 days per week.   Counseled and educated about fall risk and prevention. Requested record from Dr Alan Mulder to document last dilated eye exam and glaucoma screening Referral for audiologist  Recheck DEXA:  2 years  Time spent counseling patient:  45 minutes   Patient Instructions (the written plan) was given to the patient.  Medicare Attestation I have personally reviewed: The patient's medical and social history Their use of alcohol, tobacco or illicit drugs Their current medications and supplements The patient's functional ability including ADLs,fall risks, home safety risks, cognitive, and hearing and visual impairment Diet and physical activities Evidence for depression or mood disorders  The patient's weight, height, BMI, and BP/HR have been recorded in the chart.  I have made referrals, counseling, and provided education to the patient based on review of the above and I have provided the patient with a written personalized care plan for preventive services.     Henrene Pastor, [facility]   11/22/2013

## 2013-11-23 ENCOUNTER — Other Ambulatory Visit: Payer: Self-pay | Admitting: Pharmacist

## 2013-11-23 MED ORDER — ALENDRONATE SODIUM 70 MG PO TBEF: EFFERVESCENT_TABLET | ORAL | Status: DC

## 2013-11-29 ENCOUNTER — Encounter: Payer: Self-pay | Admitting: Family

## 2013-11-30 ENCOUNTER — Ambulatory Visit (HOSPITAL_COMMUNITY)
Admission: RE | Admit: 2013-11-30 | Discharge: 2013-11-30 | Disposition: A | Discharge status: Home / Self Care | Payer: Medicare Other | Source: Ambulatory Visit | Attending: Family | Admitting: Family

## 2013-11-30 ENCOUNTER — Encounter: Payer: Self-pay | Admitting: Family

## 2013-11-30 ENCOUNTER — Ambulatory Visit (INDEPENDENT_AMBULATORY_CARE_PROVIDER_SITE_OTHER): Payer: Medicare Other | Admitting: Family

## 2013-11-30 ENCOUNTER — Telehealth: Payer: Self-pay | Admitting: Internal Medicine

## 2013-11-30 VITALS — BP 154/56 | HR 55 | Resp 14 | Ht 67.0 in | Wt 138.0 lb

## 2013-11-30 DIAGNOSIS — I6529 Occlusion and stenosis of unspecified carotid artery: Secondary | ICD-10-CM

## 2013-11-30 DIAGNOSIS — I658 Occlusion and stenosis of other precerebral arteries: Secondary | ICD-10-CM | POA: Insufficient documentation

## 2013-11-30 DIAGNOSIS — I739 Peripheral vascular disease, unspecified: Secondary | ICD-10-CM

## 2013-11-30 DIAGNOSIS — M542 Cervicalgia: Secondary | ICD-10-CM | POA: Insufficient documentation

## 2013-11-30 NOTE — Progress Notes (Signed)
[facility]  HISTORY AND PHYSICAL   MRN : [id number]  History of Present Illness:   [name] is a 80 y.o. female patient of Dr. Myra Gianotti  was in the hospital in 2014 with symptoms of dizziness. General and a thorough workup was done, daughter states that it was determined that she did not have a stroke. It was felt that this was not secondary to her right innominate stenosis and right carotid stenosis, that she had vertigo.  She has been getting better at home. She had recently been complaining of pain in her legs with activity, particularly when walking up an incline. She describes a burning pain in the back of her legs. Since she has returned from the hospital, she has not been very active and therefore it has not experienced any of the symptoms in several weeks. She denies a history of nonhealing ulcers. She denies rest pain. She was started on cilostizol at her November, 2014 visit. She returns today for carotid cerebrovascular Duplex evaluation. She was hospitalized at [facility] in November for palpitations, cardiac and other work up was negative, cilostizol was stopped (she had taken only 2 doses) and her symptoms resolved. She denies any history of stroke. Specifically she denies denies amaurosis fugax or monocular blindness.  The patient  denies facial drooping.  Pt. denies hemiplegia.  The patient denies receptive or expressive aphasia. She denies tingling, numbness, aching or pain in either arms/hands. When she reaches over her head "everything goes black".  Dr. Pearlean Brownie evaluated pt January, 2015, his note and daughter indicate that her dizziness is caused by vertigo, and not carotid, subclavian, or innominate artery disease.  She complains of burning/pain from buttocks to feet after walking about 300 feet, relieved by rest, unable to determine if this is radiculopathy type pain or claudication type symptoms, denies non healing wounds, denies rest pain. She and  her daughter report that her blood pressure has been elevated for years and Dr. Tenny Craw, her cardiologist, is adjusting her blood pressure medications currently. No renal artery evaluation by imaging on file, daughter does not recall this being done as part of evaluation for chronic severe hypertension; she takes 3 antihypertensive medications.   Pt Diabetic: No Pt smoker: former smoker, quit about 2010, did not smoke much before then. She takes Plavix, ASA, and atorvastatin.  Current Outpatient Prescriptions  Medication Sig Dispense Refill  . Alendronate Sodium 70 MG TBEF Once weekly dissolve 1 tablet in at least 4 ounces of plain water.  Allow to dissolve completely and then drink.  Take before any food or medications in the morning.  4 tablet  5  . amLODipine (NORVASC) 10 MG tablet Take 1 tablet (10 mg total) by mouth daily. Take 1/2 tablet (5mg ) twice a day  180 tablet  3  . aspirin 81 MG tablet Take 81 mg by mouth daily.        Marland Kitchen atorvastatin (LIPITOR) 10 MG tablet Take 1 tablet (10 mg total) by mouth daily at 6 PM.  30 tablet  5  . Calcium Citrate (CALCITRATE PO) Take 1 tablet by mouth daily.       . carvedilol (COREG) 3.125 MG tablet Take 1 tablet (3.125 mg total) by mouth daily.  90 tablet  3  . clopidogrel (PLAVIX) 75 MG tablet Take 1 tablet (75 mg total) by mouth daily with breakfast.  30 tablet  5  . hydrALAZINE (APRESOLINE) 25 MG tablet Take 1.5 tablets (37.5 mg total) by mouth  2 (two) times daily.  135 tablet  3  . ibuprofen (ADVIL,MOTRIN) 200 MG tablet Take 200 mg by mouth every 6 (six) hours as needed for mild pain.       Marland Kitchen lisinopril (PRINIVIL,ZESTRIL) 40 MG tablet Take 1 tablet (40 mg total) by mouth daily.  30 tablet  5  . Multiple Vitamins-Minerals (PRESERVISION AREDS) CAPS Take 1 capsule by mouth daily.        No current facility-administered medications for this visit.    Other anticoagulants/antiplatelets: no  Past Medical History  Diagnosis Date  . Hypertension     a.  Intolerant to Maxzide due to hyponatremia. b. intolerant to Clonidine due to slow HR/fatigue. c. Discrepancy in BP felt due to innominant stenosis.  . Hyperlipidemia   . Carotid artery occlusion     RICA 80-99%, LICA 40-59% by dopplers 06/2013. Also with cerebrovascular disease otherwise on MRA 06/2013.  . Macular degeneration     In both eyes - no longer drives.  . Arthritis   . Hiatal hernia   . Chronic kidney disease   . DVT (deep venous thrombosis)     a. Around age 57, details unclear (SVT vs DVT?), s/p vein stripping without recurrence.  . Anemia     a. "On/off my whole life" - etiology unclear. Has never required blood transfusion.  . Multiple thyroid nodules     a. By [place] 06/2013. Bx recommended.  Marland Kitchen PFO (patent foramen ovale)     a. Per echo in 12/2010.  Marland Kitchen PVD (peripheral vascular disease)     a. R innominant stenosis. b. 06/2013 ABI: 0.80 R, 0.50 L.  . Dizziness     a. Adm 06/2013: felt due to posterior circulation deficits from vertebrobasilar insufficiency and possibly vertigo.  Marland Kitchen Heart murmur     a. Longstanding. mild MR on prior ECG, also may be in setting of known vasc dz.  . Carotid artery stenosis     Past Surgical History  Procedure Laterality Date  . Cataract extraction    . Lumbar disc surgery    . Incontinence surgery    . Abdominal hysterectomy  1971  . Neck muscle surgery    . Varicose vein surgery      left leg  . Eye surgery    . Bladder surgery  2004    bald[facility] sling    Social History History  Substance Use Topics  . Smoking status: Former Smoker    Quit date: 08/24/2009  . Smokeless tobacco: Never Used  . Alcohol Use: No    Family History Family History  Problem Relation Age of Onset  . Cancer Mother     [name]  . Diabetes Sister   . Cancer Sister   . Hyperlipidemia Sister   . Heart attack Sister   . Diabetes Son   . Hyperlipidemia Daughter     Allergies  Allergen Reactions  . Penicillins Rash    Oral swelling  . Clonidine  Derivatives     Slow HR/fatigue   . Maxzide [Hydrochlorothiazide W-Triamterene]     hyponatremia     REVIEW OF SYSTEMS: See HPI for pertinent positives and negatives.  Physical Examination Filed Vitals:   11/30/13 1022  BP: 154/56  Pulse: 55  Resp: 14   Filed Weights   11/30/13 1022  Weight: 138 lb (62.596 kg)   Body mass index is 21.61 kg/(m^2).  General:  WDWN in NAD Gait: Normal HENT: WNL Eyes: Pupils equal Pulmonary: normal non-labored breathing ,  without Rales, rhonchi,  wheezing Cardiac: RRR,  murmur detected  Abdomen: soft, NT, no masses Skin: no rashes, ulcers noted;  no Gangrene , no cellulitis; no open wounds;   VASCULAR EXAM  Carotid Bruits Left Right   Positive Positive                             VASCULAR EXAM: Extremities without ischemic changes without Gangrene; without open wounds.                                                                                                          LE Pulses LEFT RIGHT       FEMORAL   palpable   palpable        POPLITEAL  not palpable   not palpable       POSTERIOR TIBIAL  not palpable   not palpable        DORSALIS PEDIS      ANTERIOR TIBIAL not palpable  Faintly  palpable      Musculoskeletal: no muscle wasting or atrophy; no edema, toes of right foot have mild deformities Neurologic: A&O X 3; Appropriate Affect ;  SENSATION: normal; MOTOR FUNCTION: 5/5 Symmetric, CN 2-12 intact Speech is fluent/normal   Non-Invasive Vascular Imaging (11/30/2013): CEREBROVASCULAR DUPLEX EVALUATION    INDICATION: Carotid disease    PREVIOUS INTERVENTION(S):     DUPLEX EXAM:     RIGHT  LEFT  Peak Systolic Velocities (cm/s) End Diastolic Velocities (cm/s) Plaque LOCATION Peak Systolic Velocities (cm/s) End Diastolic Velocities (cm/s) Plaque  65 10 HT CCA PROXIMAL 103 20 HT  30 10 HT CCA MID 105 25 CP  30 8 CP CCA DISTAL 117 18 CP  24 0 CP ECA 517 33 CP  398 79 CP ICA PROXIMAL 220 42 CP  116 25  ICA  MID 156 27   78 19  ICA DISTAL 124 31     13.2 ICA / CCA Ratio (PSV) 1.88  Antegrade Vertebral Flow Antegrade/Resistive  178 Brachial Systolic Pressure (mmHg) 202  Multiphasic (subclavian artery) Brachial Artery Waveforms Multiphasic (subclavian artery)    Plaque Morphology:  HM = Homogeneous, HT = Heterogeneous, CP = Calcific Plaque, SP = Smooth Plaque, IP = Irregular Plaque     ADDITIONAL FINDINGS:   No significant stenosis of the bilateral common carotid arteries.   The right external carotid artery appears to possibly be occluded.   Left external carotid artery stenosis noted.   Greater than 10mm/Mg difference on the bilateral brachial pressures.   Velocities in the bilateral subclavian regions are: Right subclavian artery=303cm/s; Innominate artery=290; Left subclavian artery=339cm/s   Limited visualization of the bilateral carotid bifurcation and proximal subclavian artery regions due to calcific plaque shadowing and anatomic location, respectively.    IMPRESSION: 1. Doppler velocities suggest a high-end 60-79% stenosis of the right proximal internal carotid artery and a high-end 40-59% stenosis of the left proximal internal carotid artery, based on limited visualization, as described above. 2. Stenotic flow noted in the  innominate and bilateral subclavian arteries, as described above.     Compared to the previous exam:  No significant change noted when compared to the previous exam on 03/20/13.        ASSESSMENT:  [name] is a 80 y.o. female who is being followed for right innominate stenosis and right carotid stenosis. She was evaluated by Dr. Pearlean BrownieSethi, neurologist, who concluded that her dizziness  was not secondary to her right innominate stenosis and right carotid stenosis, that she had vertigo. Doppler velocities suggest a high-end 60-79% stenosis of the right proximal internal carotid artery and a high-end 40-59% stenosis of the left proximal internal carotid artery,  based on limited visualization, as described above. Stenotic flow noted in the innominate and bilateral subclavian arteries, as described above. No significant change noted when compared to the previous exam on 03/20/13.  Dr. Excell Seltzerooper checked her ABI's in Sept., 2014: right : 0.82, Left: 0.53. She complains of burning/pain from buttocks to feet after walking about 300 feet, relieved by rest, unable to determine if this is radiculopathy type pain or claudication type symptoms, denies non healing wounds, denies rest pain. She and her daughter report that her blood pressure has been elevated for years and Dr. Tenny Crawoss, her cardiologist, is adjusting her blood pressure medications currently. No renal artery evaluation by imaging on file, daughter does not recall this being done as part of evaluation for chronic severe hypertension; she takes 3 antihypertensive medications.   PLAN:   Based on today's exam and non-invasive vascular lab results, and after discussing with Dr. Darrick PennaFields, the patient will follow up in 6 months with the following tests ABI's, bilateral renal artery Duplex, and carotid Duplex. I discussed in depth with the patient the nature of atherosclerosis, and emphasized the importance of maximal medical management including strict control of blood pressure, blood glucose, and lipid levels, obtaining regular exercise, and continued cessation of smoking.  The patient is aware that without maximal medical management the underlying atherosclerotic disease process will progress, limiting the benefit of any interventions. The patient was given information about stroke prevention and what symptoms should prompt the patient to seek immediate medical care. . The patient was given information about PAD including signs, symptoms, treatment, what symptoms should prompt the patient to seek immediate medical care, and risk reduction measures to take. Thank you for allowing us to participate in this patient's  care.  Charisse MarchSuzanne Alphia Behanna, RN, MSN, FNP-C [facility] Office: [phone]  [facility] 11/30/2013 9:34 AM

## 2013-11-30 NOTE — Patient Instructions (Signed)
Stroke Prevention Some medical conditions and behaviors are associated with an increased chance of having a stroke. You may prevent a stroke by making healthy choices and managing medical conditions. HOW CAN I REDUCE MY RISK OF HAVING A STROKE?   Stay physically active. Get at least 30 minutes of activity on most or all days.  Do not smoke. It may also be helpful to avoid exposure to secondhand smoke.  Limit alcohol use. Moderate alcohol use is considered to be:  No more than 2 drinks per day for men.  No more than 1 drink per day for nonpregnant women.  Eat healthy foods. This involves  Eating 5 or more servings of fruits and vegetables a day.  Following a diet that addresses high blood pressure (hypertension), high cholesterol, diabetes, or obesity.  Manage your cholesterol levels.  A diet low in saturated fat, trans fat, and cholesterol and high in fiber may control cholesterol levels.  Take any prescribed medicines to control cholesterol as directed by your health care provider.  Manage your diabetes.  A controlled-carbohydrate, controlled-sugar diet is recommended to manage diabetes.  Take any prescribed medicines to control diabetes as directed by your health care provider.  Control your hypertension.  A low-salt (sodium), low-saturated fat, low-trans fat, and low-cholesterol diet is recommended to manage hypertension.  Take any prescribed medicines to control hypertension as directed by your health care provider.  Maintain a healthy weight.  A reduced-calorie, low-sodium, low-saturated fat, low-trans fat, low-cholesterol diet is recommended to manage weight.  Stop drug abuse.  Avoid taking birth control pills.  Talk to your health care provider about the risks of taking birth control pills if you are over 35 years old, smoke, get migraines, or have ever had a blood clot.  Get evaluated for sleep disorders (sleep apnea).  Talk to your health care provider about  getting a sleep evaluation if you snore a lot or have excessive sleepiness.  Take medicines as directed by your health care provider.  For some people, aspirin or blood thinners (anticoagulants) are helpful in reducing the risk of forming abnormal blood clots that can lead to stroke. If you have the irregular heart rhythm of atrial fibrillation, you should be on a blood thinner unless there is a good reason you cannot take them.  Understand all your medicine instructions.  Make sure that other other conditions (such as anemia or atherosclerosis) are addressed. SEEK IMMEDIATE MEDICAL CARE IF:   You have sudden weakness or numbness of the face, arm, or leg, especially on one side of the body.  Your face or eyelid droops to one side.  You have sudden confusion.  You have trouble speaking (aphasia) or understanding.  You have sudden trouble seeing in one or both eyes.  You have sudden trouble walking.  You have dizziness.  You have a loss of balance or coordination.  You have a sudden, severe headache with no known cause.  You have new chest pain or an irregular heartbeat. Any of these symptoms may represent a serious problem that is an emergency. Do not wait to see if the symptoms will go away. Get medical help at once. Call your local emergency services  (911 in [place] Do not drive yourself to the hospital. Document Released: 09/17/2004 Document Revised: 05/31/2013 Document Reviewed: 02/10/2013 Exit[employer] Patient Information 2014 [employer].   Peripheral Vascular Disease Peripheral Vascular Disease (PVD), also called Peripheral Arterial Disease (PAD), is a circulation problem caused by cholesterol (atherosclerotic plaque) deposits in the   arteries. PVD commonly occurs in the lower extremities (legs) but it can occur in other areas of the body, such as your arms. The cholesterol buildup in the arteries reduces blood flow which can cause pain and other serious problems. The presence  of PVD can place a person at risk for Coronary Artery Disease (CAD).  CAUSES  Causes of PVD can be many. It is usually associated with more than one risk factor such as:   High Cholesterol.  Smoking.  Diabetes.  Lack of exercise or inactivity.  High blood pressure (hypertension).  Obesity.  Family history. SYMPTOMS   When the lower extremities are affected, patients with PVD may experience:  Leg pain with exertion or physical activity. This is called INTERMITTENT CLAUDICATION. This may present as cramping or numbness with physical activity. The location of the pain is associated with the level of blockage. For example, blockage at the abdominal level (distal abdominal aorta) may result in buttock or hip pain. Lower leg arterial blockage may result in calf pain.  As PVD becomes more severe, pain can develop with less physical activity.  In people with severe PVD, leg pain may occur at rest.  Other PVD signs and symptoms:  Leg numbness or weakness.  Coldness in the affected leg or foot, especially when compared to the other leg.  A change in leg color.  Patients with significant PVD are more prone to ulcers or sores on toes, feet or legs. These may take longer to heal or may reoccur. The ulcers or sores can become infected.  If signs and symptoms of PVD are ignored, gangrene may occur. This can result in the loss of toes or loss of an entire limb.  Not all leg pain is related to PVD. Other medical conditions can cause leg pain such as:  Blood clots (embolism) or Deep Vein Thrombosis.  Inflammation of the blood vessels (vasculitis).  Spinal stenosis. DIAGNOSIS  Diagnosis of PVD can involve several different types of tests. These can include:  Pulse Volume Recording Method (PVR). This test is simple, painless and does not involve the use of X-rays. PVR involves measuring and comparing the blood pressure in the arms and legs. An ABI (Ankle-Brachial Index) is calculated.  The normal ratio of blood pressures is 1. As this number becomes smaller, it indicates more severe disease.  < 0.95  indicates significant narrowing in one or more leg vessels.  <0.8 there will usually be pain in the foot, leg or buttock with exercise.  <0.4 will usually have pain in the legs at rest.  <0.25  usually indicates limb threatening PVD.  Doppler detection of pulses in the legs. This test is painless and checks to see if you have a pulses in your legs/feet.  A dye or contrast material (a substance that highlights the blood vessels so they show up on x-ray) may be given to help your caregiver better see the arteries for the following tests. The dye is eliminated from your body by the kidney's. Your caregiver may order blood work to check your kidney function and other laboratory values before the following tests are performed:  Magnetic Resonance Angiography (MRA). An MRA is a picture study of the blood vessels and arteries. The MRA machine uses a large magnet to produce images of the blood vessels.  Computed Tomography Angiography (CTA). A CTA is a specialized x-ray that looks at how the blood flows in your blood vessels. An IV may be inserted into your arm so contrast dye   can be injected.  Angiogram. Is a procedure that uses x-rays to look at your blood vessels. This procedure is minimally invasive, meaning a small incision (cut) is made in your groin. A small tube (catheter) is then inserted into the artery of your groin. The catheter is guided to the blood vessel or artery your caregiver wants to examine. Contrast dye is injected into the catheter. X-rays are then taken of the blood vessel or artery. After the images are obtained, the catheter is taken out. TREATMENT  Treatment of PVD involves many interventions which may include:  Lifestyle changes:  Quitting smoking.  Exercise.  Following a low fat, low cholesterol diet.  Control of diabetes.  Foot care is very  important to the PVD patient. Good foot care can help prevent infection.  Medication:  Cholesterol-lowering medicine.  Blood pressure medicine.  Anti-platelet drugs.  Certain medicines may reduce symptoms of Intermittent Claudication.  Interventional/Surgical options:  Angioplasty. An Angioplasty is a procedure that inflates a balloon in the blocked artery. This opens the blocked artery to improve blood flow.  Stent Implant. A wire mesh tube (stent) is placed in the artery. The stent expands and stays in place, allowing the artery to remain open.  Peripheral Bypass Surgery. This is a surgical procedure that reroutes the blood around a blocked artery to help improve blood flow. This type of procedure may be performed if Angioplasty or stent implants are not an option. SEEK IMMEDIATE MEDICAL CARE IF:   You develop pain or numbness in your arms or legs.  Your arm or leg turns cold, becomes blue in color.  You develop redness, warmth, swelling and pain in your arms or legs. MAKE SURE YOU:   Understand these instructions.  Will watch your condition.  Will get help right away if you are not doing well or get worse. Document Released: 09/17/2004 Document Revised: 11/02/2011 Document Reviewed: 08/14/2008 Exit[employer] Patient Information 2014 [employer].  

## 2013-11-30 NOTE — Telephone Encounter (Signed)
New message     Have questions about mother's medications.

## 2013-11-30 NOTE — Telephone Encounter (Signed)
Returned call to patient's daughter Trudie BucklerHeidi she stated she wanted to verify correct dose of amlodipine.Daughter stated she thought she was told take amlodipine 10 mg 1/2 tablet daily, but on her instructions it says to take 10 mg 1/2 tablet twice a day.Also would like 5 mg tablet 10 mg very hard to cut in 1/2.Message sent to Dr.Ross for advice.

## 2013-12-01 NOTE — Addendum Note (Signed)
Addended by: Sharee PimpleMCCHESNEY, MARILYN K on: 12/01/2013 09:14 AM   Modules accepted: Orders

## 2013-12-02 NOTE — Telephone Encounter (Signed)
Take 10 mg 1x per day.

## 2013-12-04 NOTE — Telephone Encounter (Signed)
Left detailed message on dtr voicemail.

## 2013-12-06 ENCOUNTER — Encounter (INDEPENDENT_AMBULATORY_CARE_PROVIDER_SITE_OTHER): Payer: Medicare Other | Admitting: Ophthalmology

## 2013-12-06 ENCOUNTER — Telehealth: Payer: Self-pay | Admitting: Internal Medicine

## 2013-12-06 DIAGNOSIS — H35059 Retinal neovascularization, unspecified, unspecified eye: Secondary | ICD-10-CM

## 2013-12-06 MED ORDER — AMLODIPINE BESYLATE 5 MG PO TABS: 5.0000 mg | ORAL_TABLET | Freq: Two times a day (BID) | ORAL | Status: DC

## 2013-12-06 NOTE — Telephone Encounter (Signed)
Spoke with pt dtr, questions regarding recent med changes answered. New amlodipine script sent to pharm

## 2013-12-06 NOTE — Telephone Encounter (Signed)
New message     Talk to [name] regarding her moms medication.  She want to make sure she understands the directions and dosage.

## 2013-12-11 ENCOUNTER — Telehealth: Payer: Self-pay | Admitting: Internal Medicine

## 2013-12-15 ENCOUNTER — Ambulatory Visit: Payer: Medicare Other | Admitting: Internal Medicine

## 2013-12-20 ENCOUNTER — Ambulatory Visit: Payer: Medicare Other | Admitting: Nurse Practitioner

## 2013-12-30 ENCOUNTER — Other Ambulatory Visit: Payer: Self-pay | Admitting: Family Medicine

## 2014-01-02 NOTE — Telephone Encounter (Signed)
Last seen 08/19/14 MMM This med not on EPIC list

## 2014-01-08 ENCOUNTER — Encounter: Payer: Self-pay | Admitting: Internal Medicine

## 2014-01-08 ENCOUNTER — Ambulatory Visit (INDEPENDENT_AMBULATORY_CARE_PROVIDER_SITE_OTHER): Payer: Medicare Other | Admitting: Internal Medicine

## 2014-01-08 VITALS — BP 192/56 | HR 68 | Ht 67.0 in | Wt 141.0 lb

## 2014-01-08 DIAGNOSIS — I10 Essential (primary) hypertension: Secondary | ICD-10-CM

## 2014-01-08 MED ORDER — HYDRALAZINE HCL 25 MG PO TABS: 37.5000 mg | ORAL_TABLET | Freq: Three times a day (TID) | ORAL | Status: DC

## 2014-01-08 NOTE — Patient Instructions (Signed)
Your physician has recommended you make the following change in your medication:   1. Increase Hydralazine to 25 mg 1 and 1/2 tablet three times a day.  Your physician has requested that you regularly monitor and record your blood pressure readings at home. Please use the same machine at the same time of day to check your readings and record them. Call in 3 weeks with BP Readings.   Your physician recommends that you schedule a follow-up appointment in: 4 months with Dr. Tenny Crawoss.

## 2014-01-08 NOTE — Progress Notes (Signed)
History of Present Illness: Patient is a 80 yo with a history of HTN, CV disease, HL, macular degeneration.  She is also followed by Dr Myra GianottiBrabham  I saw her in clinic back in march  At that time I increased amlodipine  Since seen    Current Outpatient Prescriptions  Medication Sig Dispense Refill  . amLODipine (NORVASC) 5 MG tablet Take 1 tablet (5 mg total) by mouth 2 (two) times daily. Take 1/2 tablet (5mg ) twice a day  180 tablet  3  . aspirin 81 MG tablet Take 81 mg by mouth daily.        Marland Kitchen. atorvastatin (LIPITOR) 10 MG tablet Take 1 tablet (10 mg total) by mouth daily at 6 PM.  30 tablet  5  . BINOSTO 70 MG TBEF DISSOLVE 1 TABLET IN 4 OUNCES OF WATER AND DISSOLVE ONCE EACH WEEK FIRST THING IN THE MORNING  4 tablet  0  . Calcium Citrate (CALCITRATE PO) Take 1 tablet by mouth daily.       . carvedilol (COREG) 3.125 MG tablet Take 1 tablet (3.125 mg total) by mouth daily.  90 tablet  3  . clopidogrel (PLAVIX) 75 MG tablet Take 1 tablet (75 mg total) by mouth daily with breakfast.  30 tablet  5  . hydrALAZINE (APRESOLINE) 25 MG tablet Take 1.5 tablets (37.5 mg total) by mouth 2 (two) times daily.  135 tablet  3  . ibuprofen (ADVIL,MOTRIN) 200 MG tablet Take 200 mg by mouth every 6 (six) hours as needed for mild pain.       Marland Kitchen. lisinopril (PRINIVIL,ZESTRIL) 40 MG tablet Take 1 tablet (40 mg total) by mouth daily.  30 tablet  5  . Multiple Vitamins-Minerals (PRESERVISION AREDS) CAPS Take 1 capsule by mouth daily.        No current facility-administered medications for this visit.    Allergies  Allergen Reactions  . Penicillins Rash    Oral swelling  . Clonidine Derivatives     Slow HR/fatigue   . Maxzide [Hydrochlorothiazide W-Triamterene]     hyponatremia    Past Medical History  Diagnosis Date  . Hypertension     a. Intolerant to Maxzide due to hyponatremia. b. intolerant to Clonidine due to slow HR/fatigue. c. Discrepancy in BP felt due to innominant stenosis.  . Hyperlipidemia    . Carotid artery occlusion     RICA 80-99%, LICA 40-59% by dopplers 06/2013. Also with cerebrovascular disease otherwise on MRA 06/2013.  . Macular degeneration     In both eyes - no longer drives.  . Arthritis   . Hiatal hernia   . Chronic kidney disease   . DVT (deep venous thrombosis)     a. Around age 80, details unclear (SVT vs DVT?), s/p vein stripping without recurrence.  . Anemia     a. "On/off my whole life" - etiology unclear. Has never required blood transfusion.  . Multiple thyroid nodules     a. By US 06/2013. Bx recommended.  Marland Kitchen. PFO (patent foramen ovale)     a. Per echo in 12/2010.  Marland Kitchen. PVD (peripheral vascular disease)     a. R innominant stenosis. b. 06/2013 ABI: 0.80 R, 0.50 L.  . Dizziness     a. Adm 06/2013: felt due to posterior circulation deficits from vertebrobasilar insufficiency and possibly vertigo.  Marland Kitchen. Heart murmur     a. Longstanding. mild MR on prior ECG, also may be in setting of known vasc dz.  . Carotid artery  stenosis   . Varicose veins     Past Surgical History  Procedure Laterality Date  . Cataract extraction    . Lumbar disc surgery    . Incontinence surgery    . Abdominal hysterectomy  1971  . Neck muscle surgery    . Varicose vein surgery      left leg  . Eye surgery    . Bladder surgery  2004    baldder sling  . Spine surgery      History  Smoking status  . Former Smoker  . Quit date: 08/24/2009  Smokeless tobacco  . Never Used    History  Alcohol Use No    Family History  Problem Relation Age of Onset  . Cancer Mother     [name]  . Diabetes Sister   . Cancer Sister   . Hyperlipidemia Sister   . Heart attack Sister   . Diabetes Son   . Hyperlipidemia Daughter     Review of Systems: The review of systems is per the HPI.  All other systems were reviewed and are negative.  Physical Exam: BP 192/56  P 68  Wt 141 lbs  Patient is very pleasant and in no acute distress.  l.  HEENT is unremarkable.  Normocephalic/atraumatic. PERRL. Sclera are nonicteric.  Neck  No JVD.  Lungs are clear.  Cardiac exam shows a regular rate and rhythm.  II/VI systolic murmur . Abdomen is soft.  Extremities are without edema. 1+ PT Neuro. No gross neurologic deficits noted.  LABORATORY DATA:  Lab Results  Component Value Date   WBC 4.9 07/15/2013   HGB 8.4* 07/15/2013   HCT 24.7* 07/15/2013   PLT 216 07/15/2013   GLUCOSE 106* 09/19/2013   CHOL 140 09/19/2013   TRIG 64 09/19/2013   HDL 56 09/19/2013   LDLCALC 71 09/19/2013   ALT 13 09/19/2013   AST 19 09/19/2013   NA 138 09/19/2013   K 4.7 09/19/2013   CL 102 09/19/2013   CREATININE 0.99 09/19/2013   BUN 14 09/19/2013   CO2 21 09/19/2013   TSH 1.620 09/19/2013   INR 0.9 04/21/2007   CT CHEST ANGIO IMPRESSION FROM October 2012:  1. Severe atherosclerosis involving the thoracic and upper abdominal aorta without aneurysm or dissection. 2. Calcified plaque at the origin of the innominate artery causing moderate to high-grade stenosis. 3. Calcified plaque at the origin of the left common carotid artery causing mild stenosis. 4. Calcified plaque at the origin of the left subclavian artery causing mild to moderate stenosis. 5. Calcified plaque at the origin of the right vertebral artery causing mild to moderate stenosis. 6. Widely patent left vertebral artery origin. 7. Mild to moderate stenoses involving both main renal artery due to calcified plaque. 8. Calcified plaque at the origins of the celiac and SMA without significant stenosis. 9. Cardiomegaly with left adrenal hypertrophy. Extensive three- vessel coronary calcification. 10. COPD/emphysema. Calcified granuloma in the left lung apex. No acute cardiopulmonary disease. 11. Goiter. Approximate 1.9 cm nodule in the lower pole of the left lobe of the thyroid gland.  Original Report Authenticated By: Arnell Sieving, M.D.     EKG  SB 53 bpm. LVH with repol abnorm    Assessment / Plan: 1.  HTN -  BP is still high  I would increase hydralazine to 37.5 tid  Continue other meds  Call in 3 wks with BP   On coreg qd now  WOuld keep  May increase.  2. HLD - on statin th erapy  3. PVD - followed by Dr. Myra GianottiBrabham for CV disease.

## 2014-01-09 ENCOUNTER — Telehealth: Payer: Self-pay

## 2014-01-09 DIAGNOSIS — I83893 Varicose veins of bilateral lower extremities with other complications: Secondary | ICD-10-CM

## 2014-01-09 NOTE — Telephone Encounter (Signed)
Pt's daughter called.  Reported her mother has bulging varicose veins, above and below the knee, in the left lower extremity, and complains they are painful. Stated she knows her mother was evaluated recently for her carotid disease, and at that time had c/o burning pain from buttocks to feet after walking a distance.  Daughter stated the pain in her left leg varicosities is different, and in addition to the burning pain in her legs that she talked to the nurse practitioner about.  Daughter stated she would like to schedule an appt. for VV evaluation.

## 2014-01-10 ENCOUNTER — Encounter (INDEPENDENT_AMBULATORY_CARE_PROVIDER_SITE_OTHER): Payer: Medicare Other | Admitting: Ophthalmology

## 2014-01-10 DIAGNOSIS — H35059 Retinal neovascularization, unspecified, unspecified eye: Secondary | ICD-10-CM

## 2014-01-10 NOTE — Telephone Encounter (Signed)
Left detailed voicemail for pts daughter Trudie BucklerHeidi, dpm

## 2014-01-11 ENCOUNTER — Other Ambulatory Visit: Payer: Self-pay

## 2014-01-11 DIAGNOSIS — I83893 Varicose veins of bilateral lower extremities with other complications: Secondary | ICD-10-CM

## 2014-01-21 ENCOUNTER — Telehealth: Payer: Self-pay | Admitting: Physician Assistant

## 2014-01-21 NOTE — Telephone Encounter (Signed)
    I spoke with [name] daughter, [name], who explained that she took all her Sunday AM meds on Sat night by accident. She took two Lisinopril and two Plavix yesterday. I advised her hold those medications today and resume normal regimen tomorrow.    Thereasa Parkin PA-C  [facility]

## 2014-01-22 ENCOUNTER — Ambulatory Visit: Payer: Medicare Other | Admitting: Nurse Practitioner

## 2014-02-15 ENCOUNTER — Encounter: Payer: Self-pay | Admitting: Vascular Surgery

## 2014-02-16 ENCOUNTER — Encounter: Payer: Self-pay | Admitting: Vascular Surgery

## 2014-02-16 ENCOUNTER — Ambulatory Visit (HOSPITAL_COMMUNITY)
Admission: RE | Admit: 2014-02-16 | Discharge: 2014-02-16 | Disposition: A | Discharge status: Home / Self Care | Payer: Medicare Other | Source: Ambulatory Visit | Attending: Vascular Surgery | Admitting: Vascular Surgery

## 2014-02-16 ENCOUNTER — Ambulatory Visit (INDEPENDENT_AMBULATORY_CARE_PROVIDER_SITE_OTHER): Payer: Medicare Other | Admitting: Vascular Surgery

## 2014-02-16 VITALS — BP 173/51 | HR 61 | Ht 67.0 in | Wt 136.2 lb

## 2014-02-16 DIAGNOSIS — I83893 Varicose veins of bilateral lower extremities with other complications: Secondary | ICD-10-CM | POA: Insufficient documentation

## 2014-02-16 NOTE — Progress Notes (Signed)
    Established Venous Insufficiency  History of Present Illness  [name] is a 80 y.o. (1931-01-01) female who presents with chief complaint: bilateral leg cramping and stinging. The patient has had chronic varicose veins bilaterally and underwent left saphenous vein stripping 40 years ago. The patient has had ongoing chronic leg cramping that have progressively worsened. She had a previous history of deep vein thrombosis of the left leg. She denies a family history of varicose veins and deep venous thrombosis. She was seen by her primary care Chukwudi Ewen who recommended exercise with concurrent rest when her legs tired. She has not tried compression stockings in the past.   The patient's PMH, PSH, SH, FamHx, Med, and Allergies are unchanged from 11/30/13.   On ROS today: she denies any rest pain or intermittent claudication. All other systems negative.   Physical Examination  Filed Vitals:   02/16/14 1508  BP: 173/51  Pulse: 61  Height: 5\' 7"  (1.702 m)  Weight: 136 lb 3.2 oz (61.78 kg)  SpO2: 100%   Body mass index is 21.33 kg/(m^2).  General: A&O x 3, WDWN elderly female in NAD  Pulm: CTAB, sym exp, good air movt  Cardiac: RRR, nl s1, S2, no M/G/T  Vascular: Vessel Right Left  Radial Palpable Palpable  Brachial Palpable Palpable  PT Palpable Palpable  DP Palpable Palpable   GI:  Soft, NTND, -G/R, -CVAT  Musculoskeletal: M/S 5/5 throughout. Extremities without ischemic changes , B varicose veins (L>>R), large nest of varicosities origination from mid-L thigh extending down medial L leg, spider veins bilaterally  Neurologic: Pain and light touch intact in extremities   Extremities: bilateral tortuous varicose veins and spider varicosities   Non-Invasive Vascular Imaging  LLE Venous Insufficiency Duplex (Date: 02/16/2014):   no DVT and SVT, proximal GSV is absent, large perforator drains varicosities from calf into femoral vein  Medical Decision Making  Jessica Moralesancy E  Burns is a 80 y.o. female who presents with: left leg chronic venous insufficiency   Based on the patient's vascular studies and examination, I have offered the patient: symptomatic management. Her symptoms are from venous insufficiency rather than arterial.   I discussed with the patient the use of her 20-30 mm thigh high compression stockings.     Had had previous left saphenous vein stripping. Discussed that there are no surgical options for deep vein reflux.   Thank you for allowing us to participate in this patient's care.                                                            Maris BergerKimberly Trinh, PA-C [facility] Office: [phone] Pager: [phone]  02/16/2014, 3:47 PM  Addendum  I have independently interviewed and examined the patient, and I agree with the physician assistant's findings.  I recommend a trial of compressive therapy if she continues to have problems, stab phlebectomy could be entertained in 3 months.  Leonides SakeBrian Chen, MD [facility] Office: [phone] Pager: [phone]  02/16/2014, 8:12 PM

## 2014-02-19 ENCOUNTER — Encounter: Payer: Self-pay | Admitting: Nurse Practitioner

## 2014-02-19 ENCOUNTER — Other Ambulatory Visit: Payer: Self-pay | Admitting: Nurse Practitioner

## 2014-02-19 ENCOUNTER — Ambulatory Visit (INDEPENDENT_AMBULATORY_CARE_PROVIDER_SITE_OTHER): Payer: Medicare Other | Admitting: Nurse Practitioner

## 2014-02-19 VITALS — BP 172/58 | HR 58 | Temp 97.6°F | Ht 67.0 in | Wt 138.0 lb

## 2014-02-19 DIAGNOSIS — I6529 Occlusion and stenosis of unspecified carotid artery: Secondary | ICD-10-CM

## 2014-02-19 DIAGNOSIS — I10 Essential (primary) hypertension: Secondary | ICD-10-CM

## 2014-02-19 DIAGNOSIS — M899 Disorder of bone, unspecified: Secondary | ICD-10-CM

## 2014-02-19 DIAGNOSIS — M949 Disorder of cartilage, unspecified: Secondary | ICD-10-CM

## 2014-02-19 DIAGNOSIS — M8580 Other specified disorders of bone density and structure, unspecified site: Secondary | ICD-10-CM

## 2014-02-19 DIAGNOSIS — R002 Palpitations: Secondary | ICD-10-CM

## 2014-02-19 DIAGNOSIS — R011 Cardiac murmur, unspecified: Secondary | ICD-10-CM

## 2014-02-19 NOTE — Patient Instructions (Signed)

## 2014-02-19 NOTE — Progress Notes (Signed)
  Subjective:    Patient ID: [name], female    DOB: [birth year], 80 y.o.   MRN: [id number]   Patient in today for follow up- No complaints today  Hypertension This is a chronic problem. The current episode started more than 1 year ago. The problem has been resolved since onset. The problem is controlled. Pertinent negatives include no anxiety or shortness of breath. Risk factors for coronary artery disease include dyslipidemia and post-menopausal state. Past treatments include calcium channel blockers and ACE inhibitors. The current treatment provides mild improvement. There is no history of a thyroid problem.  Hyperlipidemia This is a chronic problem. The current episode started more than 1 year ago. The problem is controlled. Recent lipid tests were reviewed and are normal. She has no history of diabetes. Pertinent negatives include no shortness of breath. Current antihyperlipidemic treatment includes statins. The current treatment provides moderate improvement of lipids. Risk factors for coronary artery disease include dyslipidemia and post-menopausal.  Carotid stenosis Had them checked in the hospital- having carotid U/s on February 20 Dr. Trula Slade   Review of Systems  Respiratory: Negative for shortness of breath.   Musculoskeletal: Positive for back pain.  All other systems reviewed and are negative.      Objective:   Physical Exam  Constitutional: She is oriented to person, place, and time. She appears well-developed and well-nourished.  HENT:  Head: Normocephalic.  Eyes: Pupils are equal, round, and reactive to light.  Neck: Normal range of motion. Neck supple. No thyromegaly present.  Cardiovascular: Normal rate.   Murmur heard. Carodid Bruits noted bil   Pulmonary/Chest: Effort normal.  Diminished breath sounds   Abdominal: Soft. Bowel sounds are normal. There is no tenderness.  Musculoskeletal: Normal range of motion. She exhibits no edema.  Neurological: She is  alert and oriented to person, place, and time.  Skin: Skin is warm and dry.  Psychiatric: She has a normal mood and affect. Her behavior is normal. Judgment and thought content normal.      BP 172/58  Pulse 58  Temp(Src) 97.6 F (36.4 C) (Oral)  Ht $R'5\' 7"'ua$  (1.702 m)  Wt 138 lb (62.596 kg)  BMI 21.61 kg/m2     Assessment & Plan:    1. Osteopenia, senile   2. Murmur   3. HYPERTENSION   4. Heart palpitations   5. CAROTID STENOSIS    Orders Placed This Encounter  Procedures  . CMP14+EGFR  . NMR, lipoprofile    Labs pending Health maintenance reviewed Diet and exercise encouraged Continue all meds Follow up  In 3 months   [place]ms, FNP

## 2014-02-20 LAB — SPECIMEN STATUS REPORT

## 2014-02-20 LAB — CMP14+EGFR
A/G RATIO: 1.9 (ref 1.1–2.5)
ALK PHOS: 82 IU/L (ref 39–117)
ALT: 11 IU/L (ref 0–32)
AST: 18 IU/L (ref 0–40)
Albumin: 3.6 g/dL (ref 3.5–4.7)
BILIRUBIN TOTAL: 0.3 mg/dL (ref 0.0–1.2)
BUN / CREAT RATIO: 13 (ref 11–26)
BUN: 14 mg/dL (ref 8–27)
CO2: 21 mmol/L (ref 18–29)
Calcium: 8.4 mg/dL — ABNORMAL LOW (ref 8.7–10.3)
Chloride: 100 mmol/L (ref 97–108)
Creatinine, Ser: 1.07 mg/dL — ABNORMAL HIGH (ref 0.57–1.00)
GFR, EST AFRICAN AMERICAN: 57 mL/min/{1.73_m2} — AB (ref >59)
GFR, EST NON AFRICAN AMERICAN: 49 mL/min/{1.73_m2} — AB (ref >59)
Globulin, Total: 1.9 g/dL (ref 1.5–4.5)
Glucose: 100 mg/dL — ABNORMAL HIGH (ref 65–99)
POTASSIUM: 4.9 mmol/L (ref 3.5–5.2)
SODIUM: 134 mmol/L (ref 134–144)
Total Protein: 5.5 g/dL — ABNORMAL LOW (ref 6.0–8.5)

## 2014-02-20 LAB — LIPID PANEL
Chol/HDL Ratio: 3.5 ratio units (ref 0.0–4.4)
Cholesterol, Total: 169 mg/dL (ref 100–199)
HDL: 48 mg/dL (ref >39)
LDL CALC: 97 mg/dL (ref 0–99)
TRIGLYCERIDES: 120 mg/dL (ref 0–149)
VLDL Cholesterol Cal: 24 mg/dL (ref 5–40)

## 2014-02-20 LAB — NMR, LIPOPROFILE

## 2014-02-22 LAB — NMR, LIPOPROFILE
Cholesterol: 169 mg/dL (ref 100–199)
HDL Cholesterol by NMR: 49 mg/dL (ref >39)
HDL PARTICLE NUMBER: 32.1 umol/L (ref >=30.5)
LDL Particle Number: 1057 nmol/L — ABNORMAL HIGH (ref <1000)
LDL Size: 20.9 nm (ref >20.5)
LDLC SERPL CALC-MCNC: 95 mg/dL (ref 0–99)
LP-IR SCORE: 44 (ref <=45)
Small LDL Particle Number: 356 nmol/L (ref <=527)
TRIGLYCERIDES BY NMR: 123 mg/dL (ref 0–149)

## 2014-02-27 ENCOUNTER — Telehealth: Payer: Self-pay | Admitting: Family Medicine

## 2014-02-27 NOTE — Telephone Encounter (Signed)
Message copied by Azalee CourseFULP, Ollie Esty on Tue Feb 27, 2014 11:51 AM ------      Message from: Bennie PieriniMARTIN, MARY-MARGARET      Created: Mon Feb 26, 2014  7:43 AM       Cholesterol looks great      Continue current meds- low fat diet and exercise and recheck in 3 months       ------

## 2014-03-21 ENCOUNTER — Other Ambulatory Visit: Payer: Self-pay | Admitting: Nurse Practitioner

## 2014-03-26 ENCOUNTER — Other Ambulatory Visit: Payer: Self-pay | Admitting: Nurse Practitioner

## 2014-04-04 ENCOUNTER — Encounter (INDEPENDENT_AMBULATORY_CARE_PROVIDER_SITE_OTHER): Payer: Medicare Other | Admitting: Ophthalmology

## 2014-04-04 DIAGNOSIS — H43819 Vitreous degeneration, unspecified eye: Secondary | ICD-10-CM

## 2014-04-04 DIAGNOSIS — H35039 Hypertensive retinopathy, unspecified eye: Secondary | ICD-10-CM

## 2014-04-04 DIAGNOSIS — H3530 Unspecified macular degeneration: Secondary | ICD-10-CM

## 2014-04-04 DIAGNOSIS — I10 Essential (primary) hypertension: Secondary | ICD-10-CM | POA: Diagnosis not present

## 2014-05-10 NOTE — Progress Notes (Signed)
History of Present Illness: Patient is a 80 yo with a history of HTN, CV disease, HL, macular degeneration.  She is also followed by Dr Myra Gianotti  I saw her in clinic back in march  At that time I increased amlodipine  Patient was last seen in May. She denies CP  Breahting is OK No dizziness    Current Outpatient Prescriptions  Medication Sig Dispense Refill  . amLODipine (NORVASC) 5 MG tablet Take 5 mg by mouth 2 (two) times daily.      Marland Kitchen aspirin 81 MG tablet Take 81 mg by mouth daily.        Marland Kitchen atorvastatin (LIPITOR) 10 MG tablet TAKE 1 TABLET (10 MG TOTAL) BY MOUTH DAILY AT 6 PM.  30 tablet  4  . Calcium Citrate (CALCITRATE PO) Take 1 tablet by mouth daily.       . carvedilol (COREG) 3.125 MG tablet Take 1 tablet (3.125 mg total) by mouth daily.  90 tablet  3  . clopidogrel (PLAVIX) 75 MG tablet TAKE 1 TABLET BY MOUTH DAILY WITH BREAKFAST  30 tablet  4  . hydrALAZINE (APRESOLINE) 25 MG tablet Take 1.5 tablets (37.5 mg total) by mouth 3 (three) times daily.  135 tablet  3  . ibuprofen (ADVIL,MOTRIN) 200 MG tablet Take 200 mg by mouth every 6 (six) hours as needed for mild pain.       Marland Kitchen lisinopril (PRINIVIL,ZESTRIL) 40 MG tablet TAKE ONE TABLET BY MOUTH ONE TIME DAILY  30 tablet  4  . Multiple Vitamins-Minerals (PRESERVISION AREDS) CAPS Take 1 capsule by mouth daily.        No current facility-administered medications for this visit.    Allergies  Allergen Reactions  . Penicillins Rash    Oral swelling  . Clonidine Derivatives     Slow HR/fatigue   . Maxzide [Hydrochlorothiazide W-Triamterene]     hyponatremia    Past Medical History  Diagnosis Date  . Hypertension     a. Intolerant to Maxzide due to hyponatremia. b. intolerant to Clonidine due to slow HR/fatigue. c. Discrepancy in BP felt due to innominant stenosis.  . Hyperlipidemia   . Carotid artery occlusion     RICA 80-99%, LICA 40-59% by dopplers 06/2013. Also with cerebrovascular disease otherwise on MRA 06/2013.  .  Macular degeneration     In both eyes - no longer drives.  . Arthritis   . Hiatal hernia   . Chronic kidney disease   . DVT (deep venous thrombosis)     a. Around age 37, details unclear (SVT vs DVT?), s/p vein stripping without recurrence.  . Anemia     a. "On/off my whole life" - etiology unclear. Has never required blood transfusion.  . Multiple thyroid nodules     a. By [place] 06/2013. Bx recommended.  Marland Kitchen PFO (patent foramen ovale)     a. Per echo in 12/2010.  Marland Kitchen PVD (peripheral vascular disease)     a. R innominant stenosis. b. 06/2013 ABI: 0.80 R, 0.50 L.  . Dizziness     a. Adm 06/2013: felt due to posterior circulation deficits from vertebrobasilar insufficiency and possibly vertigo.  Marland Kitchen Heart murmur     a. Longstanding. mild MR on prior ECG, also may be in setting of known vasc dz.  . Carotid artery stenosis   . Varicose veins     Past Surgical History  Procedure Laterality Date  . Cataract extraction    . Lumbar disc surgery    .  Incontinence surgery    . Abdominal hysterectomy  1971  . Neck muscle surgery    . Varicose vein surgery      left leg  . Eye surgery    . Bladder surgery  2004    baldder sling  . Spine surgery      History  Smoking status  . Former Smoker  . Quit date: 08/24/2009  Smokeless tobacco  . Never Used    History  Alcohol Use No    Family History  Problem Relation Age of Onset  . Cancer Mother     [name]  . Diabetes Sister   . Cancer Sister   . Hyperlipidemia Sister   . Heart attack Sister   . Diabetes Son   . Hyperlipidemia Daughter     Review of Systems: The review of systems is per the HPI.  All other systems were reviewed and are negative.  Physical Exam: BP 158/48 P 54  Wt 141 lbs  Patient is very pleasant and in no acute distress.  l.  HEENT is unremarkable. Normocephalic/atraumatic. PERRL. Sclera are nonicteric.  Neck  No JVD. Bruit L Lungs are clear.  Cardiac exam shows a regular rate and rhythm.  I/VI systolic  murmur . Abdomen is soft.  Extremities are without edema. 1+ PT Neuro. No gross neurologic deficits noted.  LABORATORY DATA:  Lab Results  Component Value Date   WBC 4.9 07/15/2013   HGB 8.4* 07/15/2013   HCT 24.7* 07/15/2013   PLT 216 07/15/2013   GLUCOSE 100* 02/19/2014   CHOL CANCELED 02/19/2014   TRIG CANCELED 02/19/2014   TRIG 120 02/19/2014   HDL CANCELED 02/19/2014   HDL 48 02/19/2014   LDLCALC 97 02/19/2014   ALT 11 02/19/2014   AST 18 02/19/2014   NA 134 02/19/2014   K 4.9 02/19/2014   CL 100 02/19/2014   CREATININE 1.07* 02/19/2014   BUN 14 02/19/2014   CO2 21 02/19/2014   TSH 1.620 09/19/2013   INR 0.9 04/21/2007   CT CHEST ANGIO IMPRESSION FROM October 2012:  1. Severe atherosclerosis involving the thoracic and upper abdominal aorta without aneurysm or dissection. 2. Calcified plaque at the origin of the innominate artery causing moderate to high-grade stenosis. 3. Calcified plaque at the origin of the left common carotid artery causing mild stenosis. 4. Calcified plaque at the origin of the left subclavian artery causing mild to moderate stenosis. 5. Calcified plaque at the origin of the right vertebral artery causing mild to moderate stenosis. 6. Widely patent left vertebral artery origin. 7. Mild to moderate stenoses involving both main renal artery due to calcified plaque. 8. Calcified plaque at the origins of the celiac and SMA without significant stenosis. 9. Cardiomegaly with left adrenal hypertrophy. Extensive three- vessel coronary calcification. 10. COPD/emphysema. Calcified granuloma in the left lung apex. No acute cardiopulmonary disease. 11. Goiter. Approximate 1.9 cm nodule in the lower pole of the left lobe of the thyroid gland.  Original Report Authenticated By: Arnell Sieving, M.D.     EKG  SB 54 bpm. LVH with repol abnorm    Assessment / Plan: 1. HTN -  BP is better  Keep on current regimen   2. HLD - on statin th erapy  INcerase to 20 qd  as LDL was 97 3  Anemia  Repeat with ferr 4.  CAD  I am not convinced of active symptoms.   3. PVD - followed by Dr. Myra Gianotti for CV disease.

## 2014-05-11 ENCOUNTER — Encounter: Payer: Self-pay | Admitting: Internal Medicine

## 2014-05-11 ENCOUNTER — Ambulatory Visit (INDEPENDENT_AMBULATORY_CARE_PROVIDER_SITE_OTHER): Payer: Medicare Other | Admitting: Internal Medicine

## 2014-05-11 VITALS — BP 158/48 | HR 54 | Ht 67.0 in | Wt 142.0 lb

## 2014-05-11 DIAGNOSIS — D649 Anemia, unspecified: Secondary | ICD-10-CM

## 2014-05-11 DIAGNOSIS — R42 Dizziness and giddiness: Secondary | ICD-10-CM

## 2014-05-11 DIAGNOSIS — R002 Palpitations: Secondary | ICD-10-CM

## 2014-05-11 LAB — CBC
HCT: 29.1 % — ABNORMAL LOW (ref 36.0–46.0)
Hemoglobin: 9.9 g/dL — ABNORMAL LOW (ref 12.0–15.0)
MCHC: 33.8 g/dL (ref 30.0–36.0)
MCV: 87.7 fl (ref 78.0–100.0)
Platelets: 259 10*3/uL (ref 150.0–400.0)
RBC: 3.32 Mil/uL — ABNORMAL LOW (ref 3.87–5.11)
RDW: 14 % (ref 11.5–15.5)
WBC: 6.4 10*3/uL (ref 4.0–10.5)

## 2014-05-11 LAB — BASIC METABOLIC PANEL
BUN: 13 mg/dL (ref 6–23)
CHLORIDE: 103 meq/L (ref 96–112)
CO2: 24 mEq/L (ref 19–32)
CREATININE: 1.1 mg/dL (ref 0.4–1.2)
Calcium: 8.9 mg/dL (ref 8.4–10.5)
GFR: 50.12 mL/min — ABNORMAL LOW (ref >60.00)
Glucose, Bld: 111 mg/dL — ABNORMAL HIGH (ref 70–99)
POTASSIUM: 4.4 meq/L (ref 3.5–5.1)
Sodium: 134 mEq/L — ABNORMAL LOW (ref 135–145)

## 2014-05-11 LAB — FERRITIN: FERRITIN: 48.8 ng/mL (ref 10.0–291.0)

## 2014-05-11 NOTE — Patient Instructions (Signed)
Your physician recommends that you continue on your current medications as directed. Please refer to the Current Medication list given to you today. Your physician recommends that you return for lab work TODAY (CBC, BMET, FERRITIN, RETICULOCYTE COUNT) Your physician wants you to follow-up in: 6 MONTHS WITH DR ROSS.  You will receive a reminder letter in the mail two months in advance. If you don't receive a letter, please call our office to schedule the follow-up appointment.

## 2014-05-12 LAB — RETICULOCYTES
ABS RETIC: 44.6 10*3/uL (ref 19.0–186.0)
RBC.: 3.43 MIL/uL — ABNORMAL LOW (ref 3.87–5.11)
RETIC CT PCT: 1.3 % (ref 0.4–2.3)

## 2014-05-14 ENCOUNTER — Telehealth: Payer: Self-pay | Admitting: Internal Medicine

## 2014-05-14 NOTE — Telephone Encounter (Signed)
New message     Need to look over her medication direction / dosage . An update lipitor was suppose to be called in which was never done.

## 2014-05-17 MED ORDER — ATORVASTATIN CALCIUM 20 MG PO TABS: 20.0000 mg | ORAL_TABLET | Freq: Every day | ORAL | Status: DC

## 2014-05-17 NOTE — Telephone Encounter (Signed)
Spoke with patient's daughter. Per OV 9/18--pt is to increase lipitor to 20 mg daily. Pt and daughter were already aware of this and running low on the 10 mg lipitor tablets. New script for [facility]itor 20 sent to [facility] per daughter's request.

## 2014-05-25 ENCOUNTER — Other Ambulatory Visit: Payer: Self-pay | Admitting: *Deleted

## 2014-05-25 MED ORDER — HYDRALAZINE HCL 25 MG PO TABS
37.5000 mg | ORAL_TABLET | Freq: Three times a day (TID) | ORAL | Status: DC
Start: 2014-05-25 — End: 2014-09-18

## 2014-06-04 ENCOUNTER — Encounter (INDEPENDENT_AMBULATORY_CARE_PROVIDER_SITE_OTHER): Payer: Medicare Other | Admitting: Ophthalmology

## 2014-06-04 DIAGNOSIS — I10 Essential (primary) hypertension: Secondary | ICD-10-CM | POA: Diagnosis not present

## 2014-06-04 DIAGNOSIS — H35033 Hypertensive retinopathy, bilateral: Secondary | ICD-10-CM

## 2014-06-04 DIAGNOSIS — H43813 Vitreous degeneration, bilateral: Secondary | ICD-10-CM | POA: Diagnosis not present

## 2014-06-04 DIAGNOSIS — H3531 Nonexudative age-related macular degeneration: Secondary | ICD-10-CM

## 2014-06-05 ENCOUNTER — Telehealth: Payer: Self-pay | Admitting: Internal Medicine

## 2014-06-05 NOTE — Telephone Encounter (Signed)
Daughter wanted to clarify instruction for iron supplementation. Reviewed recent labs and instructions from Dr. Tenny Crawoss. Daughter verbalizes understanding and appreciation.

## 2014-06-05 NOTE — Telephone Encounter (Signed)
New message     Talk to a nurse.  She want to make sure she understands what the nurse told her mother last week

## 2014-06-08 ENCOUNTER — Encounter: Payer: Self-pay | Admitting: Family

## 2014-06-11 ENCOUNTER — Other Ambulatory Visit: Payer: Self-pay | Admitting: Family

## 2014-06-11 ENCOUNTER — Ambulatory Visit (INDEPENDENT_AMBULATORY_CARE_PROVIDER_SITE_OTHER)
Admission: RE | Admit: 2014-06-11 | Discharge: 2014-06-11 | Disposition: A | Discharge status: Home / Self Care | Payer: Medicare Other | Source: Ambulatory Visit | Attending: Family | Admitting: Family

## 2014-06-11 ENCOUNTER — Ambulatory Visit (HOSPITAL_COMMUNITY)
Admission: RE | Admit: 2014-06-11 | Discharge: 2014-06-11 | Disposition: A | Discharge status: Home / Self Care | Payer: Medicare Other | Source: Ambulatory Visit | Attending: Family | Admitting: Family

## 2014-06-11 ENCOUNTER — Ambulatory Visit (INDEPENDENT_AMBULATORY_CARE_PROVIDER_SITE_OTHER): Payer: Medicare Other | Admitting: Family

## 2014-06-11 ENCOUNTER — Encounter: Payer: Self-pay | Admitting: Family

## 2014-06-11 ENCOUNTER — Telehealth: Payer: Self-pay | Admitting: Surgery

## 2014-06-11 VITALS — BP 134/64 | HR 63 | Resp 16 | Ht 67.0 in | Wt 142.0 lb

## 2014-06-11 DIAGNOSIS — I10 Essential (primary) hypertension: Secondary | ICD-10-CM

## 2014-06-11 DIAGNOSIS — I739 Peripheral vascular disease, unspecified: Secondary | ICD-10-CM

## 2014-06-11 DIAGNOSIS — I6529 Occlusion and stenosis of unspecified carotid artery: Secondary | ICD-10-CM

## 2014-06-11 DIAGNOSIS — I6523 Occlusion and stenosis of bilateral carotid arteries: Secondary | ICD-10-CM

## 2014-06-11 DIAGNOSIS — I771 Stricture of artery: Secondary | ICD-10-CM

## 2014-06-11 NOTE — Progress Notes (Signed)
[facility] [place]ISTORY [facility] PHYSICAL   MRN : [id number]  History of Present Illness:   [name] is a 80 y.o. female patient of Dr. Myra Gianotti was in the hospital in 2014 with symptoms of dizziness. General and a thorough workup was done, daughter states that it was determined that she did not have a stroke. It was felt that this was not secondary to her right innominate stenosis and right carotid stenosis, that she had vertigo.  She has been getting better at home. She had recently been complaining of pain in her legs with activity, particularly when walking up an incline. She describes a burning pain in the back of her legs. Since she has returned from the hospital, she has not been very active and therefore it has not experienced any of the symptoms in several weeks. She denies a history of nonhealing ulcers. She denies rest pain.  She was started on cilostizol at her November, 2014 visit.  She returns today for carotid cerebrovascular Duplex evaluation, ABI's, and renal artery Duplex.  She was hospitalized at [facility] in November 2014 for palpitations, cardiac and other work up was negative, cilostizol was stopped (she had taken only 2 doses) and her symptoms resolved.  She denies any history of stroke. Specifically she denies denies amaurosis fugax or monocular blindness. The patient denies facial drooping.  Pt. denies hemiplegia. The patient denies receptive or expressive aphasia.  She denies tingling, numbness, aching or pain in either arms/hands.  When she reaches over her head "everything goes black".  Dr. Pearlean Brownie evaluated pt January, 2015, his note and daughter indicate that her dizziness is caused by vertigo, and not carotid, subclavian, or innominate artery disease.  She complains of burning/pain from buttocks to feet after walking about 300 feet, relieved by rest, she also has a history of lumbar spine surgery, denies non healing wounds, denies rest pain.  She  and her daughter report that her blood pressure has been elevated for years and Dr. Tenny Craw, her cardiologist, is adjusting her blood pressure medications.  She takes 3 antihypertensive medications.   Pt Diabetic: No  Pt smoker: former smoker, quit about 2010, did not smoke much before then.  She takes Plavix, ASA, and atorvastatin.   Current Outpatient Prescriptions  Medication Sig Dispense Refill  . amLODipine (NORVASC) 5 MG tablet Take 5 mg by mouth 2 (two) times daily.      Marland Kitchen aspirin 81 MG tablet Take 81 mg by mouth daily.        Marland Kitchen atorvastatin (LIPITOR) 20 MG tablet Take 1 tablet (20 mg total) by mouth daily.  90 tablet  3  . Calcium Citrate (CALCITRATE PO) Take 1 tablet by mouth daily.       . carvedilol (COREG) 3.125 MG tablet Take 1 tablet (3.125 mg total) by mouth daily.  90 tablet  3  . clopidogrel (PLAVIX) 75 MG tablet TAKE 1 TABLET BY MOUTH DAILY WITH BREAKFAST  30 tablet  4  . hydrALAZINE (APRESOLINE) 25 MG tablet Take 1.5 tablets (37.5 mg total) by mouth 3 (three) times daily.  135 tablet  3  . ibuprofen (ADVIL,MOTRIN) 200 MG tablet Take 200 mg by mouth every 6 (six) hours as needed for mild pain.       Marland Kitchen lisinopril (PRINIVIL,ZESTRIL) 40 MG tablet TAKE ONE TABLET BY MOUTH ONE TIME DAILY  30 tablet  4  . Multiple Vitamins-Minerals (PRESERVISION AREDS) CAPS Take 1 capsule by mouth daily.  No current facility-administered medications for this visit.    Past Medical History  Diagnosis Date  . Hypertension     a. Intolerant to Maxzide due to hyponatremia. b. intolerant to Clonidine due to slow HR/fatigue. c. Discrepancy in BP felt due to innominant stenosis.  . Hyperlipidemia   . Carotid artery occlusion     RICA 80-99%, LICA 40-59% by dopplers 06/2013. Also with cerebrovascular disease otherwise on MRA 06/2013.  . Macular degeneration     In both eyes - no longer drives.  . Arthritis   . Hiatal hernia   . Chronic kidney disease   . DVT (deep venous thrombosis)     a.  Around age 82, details unclear (SVT vs DVT?), s/p vein stripping without recurrence.  . Anemia     a. "On/off my whole life" - etiology unclear. Has never required blood transfusion.  . Multiple thyroid nodules     a. By [place] 06/2013. Bx recommended.  Marland Kitchen PFO (patent foramen ovale)     a. Per echo in 12/2010.  Marland Kitchen PVD (peripheral vascular disease)     a. R innominant stenosis. b. 06/2013 ABI: 0.80 R, 0.50 L.  . Dizziness     a. Adm 06/2013: felt due to posterior circulation deficits from vertebrobasilar insufficiency and possibly vertigo.  Marland Kitchen Heart murmur     a. Longstanding. mild MR on prior ECG, also may be in setting of known vasc dz.  . Carotid artery stenosis   . Varicose veins     Social History History  Substance Use Topics  . Smoking status: Former Smoker    Quit date: 08/24/2009  . Smokeless tobacco: Never Used  . Alcohol Use: No    Family History Family History  Problem Relation Age of Onset  . Cancer Mother     [name]  . Diabetes Sister   . Cancer Sister   . Hyperlipidemia Sister   . Heart attack Sister   . Diabetes Son   . Hyperlipidemia Daughter     Surgical History Past Surgical History  Procedure Laterality Date  . Cataract extraction    . Lumbar disc surgery    . Incontinence surgery    . Abdominal hysterectomy  1971  . Neck muscle surgery    . Varicose vein surgery      left leg  . Eye surgery    . Bladder surgery  2004    baldder sling  . Spine surgery      Allergies  Allergen Reactions  . Penicillins Rash    Oral swelling  . Clonidine Derivatives     Slow HR/fatigue   . Maxzide [Hydrochlorothiazide W-Triamterene]     hyponatremia    Current Outpatient Prescriptions  Medication Sig Dispense Refill  . amLODipine (NORVASC) 5 MG tablet Take 5 mg by mouth 2 (two) times daily.      Marland Kitchen aspirin 81 MG tablet Take 81 mg by mouth daily.        Marland Kitchen atorvastatin (LIPITOR) 20 MG tablet Take 1 tablet (20 mg total) by mouth daily.  90 tablet  3  .  Calcium Citrate (CALCITRATE PO) Take 1 tablet by mouth daily.       . carvedilol (COREG) 3.125 MG tablet Take 1 tablet (3.125 mg total) by mouth daily.  90 tablet  3  . clopidogrel (PLAVIX) 75 MG tablet TAKE 1 TABLET BY MOUTH DAILY WITH BREAKFAST  30 tablet  4  . hydrALAZINE (APRESOLINE) 25 MG tablet Take 1.5 tablets (37.5  mg total) by mouth 3 (three) times daily.  135 tablet  3  . ibuprofen (ADVIL,MOTRIN) 200 MG tablet Take 200 mg by mouth every 6 (six) hours as needed for mild pain.       Marland Kitchen. lisinopril (PRINIVIL,ZESTRIL) 40 MG tablet TAKE ONE TABLET BY MOUTH ONE TIME DAILY  30 tablet  4  . Multiple Vitamins-Minerals (PRESERVISION AREDS) CAPS Take 1 capsule by mouth daily.        No current facility-administered medications for this visit.     REVIEW OF SYSTEMS: See HPI for pertinent positives and negatives.  Physical Examination  Filed Vitals:   06/11/14 1059 06/11/14 1101  BP: 183/68 134/64  Pulse: 66 63  Resp:  16  Height:  5\' 7"  (1.702 m)  Weight:  142 lb (64.411 kg)  SpO2:  99%   Body mass index is 22.24 kg/(m^2).  General: WDWN in NAD  Gait: Normal  HENT: WNL  Eyes: Pupils equal  Pulmonary: normal non-labored breathing , without Rales, rhonchi, wheezing  Cardiac: RRR, murmur detected  Abdomen: soft, NT, no masses palpated.  Skin: no rashes, ulcers noted; no Gangrene , no cellulitis; no open wounds.  VASCULAR EXAM  Carotid Bruits  Left  Right    Positive  Positive    Extremities without ischemic changes  without Gangrene; without open wounds.   LE Pulses  LEFT  RIGHT   FEMORAL  Not palpable  1+palpable   POPLITEAL  not palpable  not palpable   POSTERIOR TIBIAL  not palpable  not palpable   DORSALIS PEDIS  ANTERIOR TIBIAL  not palpable  not palpable    Musculoskeletal: no muscle wasting or atrophy; no peripheral edema, toes of right foot have mild deformities  Neurologic: A&O X 3; Appropriate Affect ;  SENSATION: normal;  MOTOR FUNCTION: 5/5 Symmetric, CN 2-12  intact except for some hearing loss,  Speech is fluent/normal    Non-Invasive Vascular Imaging (06/11/2014):   CEREBROVASCULAR DUPLEX EVALUATION    INDICATION: Carotid disease    PREVIOUS INTERVENTION(S):     DUPLEX EXAM:     RIGHT  LEFT  Peak Systolic Velocities (cm/s) End Diastolic Velocities (cm/s) Plaque LOCATION Peak Systolic Velocities (cm/s) End Diastolic Velocities (cm/s) Plaque  169 26 HT CCA PROXIMAL 117 15 HT  26 9 HT CCA MID 101 19 CP  35 12 CP CCA DISTAL 126 22 CP  Not Visualized   CP ECA 581 42 CP  407 120 CP ICA PROXIMAL 232 48 CP  66 24  ICA MID 191 44   81 26  ICA DISTAL 135 34     Not Calculated ICA / CCA Ratio (PSV) 1.84  Antegrade Vertebral Flow Antegrade  173 Brachial Systolic Pressure (mmHg) 208  Multiphasic (subclavian artery) Brachial Artery Waveforms Multiphasic (subclavian artery)    Plaque Morphology:  HM = Homogeneous, HT = Heterogeneous, CP = Calcific Plaque, SP = Smooth Plaque, IP = Irregular Plaque     ADDITIONAL FINDINGS:   No significant stenosis of the bilateral common carotid arteries.   The right external carotid artery origin appears to possibly be occluded.   Left external carotid artery stenosis noted.   Greater than 6930mm/Mg difference on the bilateral brachial pressures.   Dampened waveforms noted in the right common carotid artery which suggest a proximal stenosis.   Velocities in the bilateral subclavian regions are: Right subclavian artery=303cm/s; Innominate artery=291cm/s; Left subclavian artery=293cm/s   Limited visualization of the bilateral carotid bifurcation and proximal subclavian artery regions due  to calcific plaque shadowing and anatomic location, respectively.   IMPRESSION: 1. Doppler velocities suggest an 80-99% stenosis of the right proximal internal carotid artery and a high-end 40-59% stenosis of the left proximal internal carotid artery, based on limited visualization, as described above. 2. Stenotic flow noted in  the innominate and bilateral subclavian arteries, as described above. 3. Comparison to the previous exam is noted on the second page of this report.      Compared to the previous exam:  Significant progression of disease noted in the right proximal internal carotid artery when compared to the previous exam on 11/30/13 with the remainder of the exam remaining relatively unchanged.      RENAL ARTERY DUPLEX EVALUATION    INDICATION: Severe uncontrolled hypertension    PREVIOUS INTERVENTION(S):     DUPLEX EXAM:     AORTA Peak Systolic Velocity (cm/s): 98    RIGHT  LEFT   Peak Systolic Velocities (cm/s) Comments  Peak Systolic Velocities (cm/s) Comments  185  Renal Artery Origin/Proximal 120   204  Renal Artery Mid 90   89  Renal Artery Distal 93     Accessory Renal Artery (when present)    2.1  Renal / Aortic Ratio (RAR) 1.2  10.5 Kidney Size (cm) 10.6  Patent  Renal Vein Patent    ADDITIONAL FINDINGS:   Decreased visualization of the abdominal vasculature due to overlying bowel gas.   Resistive waveforms noted in the bilateral renal arteries.   The bilateral kidney length measurements are within normal limits.    IMPRESSION: Doppler velocities and renal-aortic ratios suggest no greater than 60% stenosis of the bilateral renal arteries, based on limited visualization, as described above.    Compared to the previous exam:  No previous duplex exam available for comparison.      ABI (Date: 06/11/2014)  R: 0.62 (0.82, 05/12/13),  monophasic, TBI: 0.42  L: 0.52 (0.53), monophasic, TBI: 0.32   ASSESSMENT:  [name] is a 80 y.o. female who is being followed for right innominate artery stenosis and right carotid artery stenosis.  She was evaluated by Dr. Pearlean BrownieSethi, neurologist, who concluded that her dizziness was not secondary to her right innominate stenosis and right carotid stenosis, that she had vertigo. The patient and her daughter deny any recent stroke or TIA symptoms.   Today's carotid Duplex indicates  an 80-99% stenosis of the right proximal internal carotid artery and a high-end 40-59% stenosis of the left proximal internal carotid artery, based on limited visualization, as described above. Stenotic flow noted in the innominate and bilateral subclavian arteries, as described above. Significant progression of disease noted in the right proximal internal carotid artery when compared to the previous exam on 11/30/13 with the remainder of the exam remaining relatively unchanged.   Renal artery Duplex today:  Doppler velocities and renal-aortic ratios suggest no greater than 60% stenosis of the bilateral renal arteries, based on limited visualization, as described above. The bilateral kidney length measurements are within normal limits. No previous duplex exam available for comparison.   ABI's today indicate moderate arterial occlusive disease in the right LE, worsened from mild six months prior, and severe arterial occlusive disease in the left LE, stable from six months prior. Claudication symptoms are unchanged, burning in both hips with walking, relieved by rest, no tissue loss. Left femoral artery is not palpable suggestive of left iliac stenosis. She also has a history of lumbar spine surgery.   PLAN:   Based on today's exam  and non-invasive vascular lab results, and after discussing with Dr. Myra Gianotti, the patient will be scheduled for CTA of neck and chest ASAP and follow up with Dr. Myra Gianotti. I discussed in depth with the patient the nature of atherosclerosis, and emphasized the importance of maximal medical management including strict control of blood pressure, blood glucose, and lipid levels, obtaining regular exercise, and cessation of smoking.  The patient is aware that without maximal medical management the underlying atherosclerotic disease process will progress, limiting the benefit of any interventions.  The patient was given information about stroke  prevention and what symptoms should prompt the patient to seek immediate medical care.  The patient was given information about PAD including signs, symptoms, treatment, what symptoms should prompt the patient to seek immediate medical care, and risk reduction measures to take. Thank you for allowing [place] to participate in this patient's care.  Charisse March, RN, MSN, FNP-C [facility] Office: [phone]  Clinic MD: Myra Gianotti 06/11/2014 10:57 AM

## 2014-06-11 NOTE — Telephone Encounter (Signed)
Spoke with Heidi - gave following info[facility] 11[facility]45 am Dr. Myra GianottiBrabham 06/18/14 3:15 pm - Verbalized understanding.

## 2014-06-11 NOTE — Addendum Note (Signed)
Addended by: Sharee PimpleMCCHESNEY, Renia Mikelson K on: 06/11/2014 12:51 PM   Modules accepted: Orders

## 2014-06-11 NOTE — Patient Instructions (Signed)

## 2014-06-15 ENCOUNTER — Encounter: Payer: Self-pay | Admitting: Surgery

## 2014-06-15 ENCOUNTER — Ambulatory Visit
Admission: RE | Admit: 2014-06-15 | Discharge: 2014-06-15 | Disposition: A | Discharge status: Home / Self Care | Payer: Medicare Other | Source: Ambulatory Visit | Attending: Family | Admitting: Family

## 2014-06-15 ENCOUNTER — Other Ambulatory Visit: Payer: Self-pay | Admitting: Family

## 2014-06-15 DIAGNOSIS — I771 Stricture of artery: Secondary | ICD-10-CM

## 2014-06-15 DIAGNOSIS — I6523 Occlusion and stenosis of bilateral carotid arteries: Secondary | ICD-10-CM

## 2014-06-15 MED ORDER — IOHEXOL 350 MG/ML SOLN
160.0000 mL | Freq: Once | INTRAVENOUS | Status: AC | PRN
  Administered 2014-06-15: 160 mL via INTRAVENOUS

## 2014-06-18 ENCOUNTER — Ambulatory Visit (INDEPENDENT_AMBULATORY_CARE_PROVIDER_SITE_OTHER): Payer: Medicare Other | Admitting: Surgery

## 2014-06-18 ENCOUNTER — Encounter: Payer: Self-pay | Admitting: Surgery

## 2014-06-18 VITALS — BP 182/40 | HR 68 | Temp 97.6°F | Resp 16 | Ht 67.0 in | Wt 142.8 lb

## 2014-06-18 DIAGNOSIS — I6523 Occlusion and stenosis of bilateral carotid arteries: Secondary | ICD-10-CM

## 2014-06-18 NOTE — Progress Notes (Signed)
Patient name: [name] MRN: [id number] DOB: 1930/11/28 Sex: female     Chief Complaint  Patient presents with  . Carotid    follow up after CTA     HISTORY OF PRESENT ILLNESS: The patient comes in today for follow-up of her right carotid and innominate stenosis.  Her most recent ultrasound suggested progression of disease to greater than 80%.  I sent her for a CT scan to better evaluate her carotid as well as her innominate artery stenosis.  The patient reports no symptoms.  She denies numbness or weakness in either extremity.  She denies third speech.  She denies amaurosis fugax.  Past Medical History  Diagnosis Date  . Hypertension     a. Intolerant to Maxzide due to hyponatremia. b. intolerant to Clonidine due to slow HR/fatigue. c. Discrepancy in BP felt due to innominant stenosis.  . Hyperlipidemia   . Carotid artery occlusion     RICA 80-99%, LICA 40-59% by dopplers 06/2013. Also with cerebrovascular disease otherwise on MRA 06/2013.  . Macular degeneration     In both eyes - no longer drives.  . Arthritis   . Hiatal hernia   . Chronic kidney disease   . DVT (deep venous thrombosis)     a. Around age 80, details unclear (SVT vs DVT?), s/p vein stripping without recurrence.  . Anemia     a. "On/off my whole life" - etiology unclear. Has never required blood transfusion.  . Multiple thyroid nodules     a. By US 06/2013. Bx recommended.  Marland Kitchen. PFO (patent foramen ovale)     a. Per echo in 12/2010.  Marland Kitchen. PVD (peripheral vascular disease)     a. R innominant stenosis. b. 06/2013 ABI: 0.80 R, 0.50 L.  . Dizziness     a. Adm 06/2013: felt due to posterior circulation deficits from vertebrobasilar insufficiency and possibly vertigo.  Marland Kitchen. Heart murmur     a. Longstanding. mild MR on prior ECG, also may be in setting of known vasc dz.  . Carotid artery stenosis   . Varicose veins     Past Surgical History  Procedure Laterality Date  . Cataract extraction    . Lumbar disc  surgery    . Incontinence surgery    . Abdominal hysterectomy  1971  . Neck muscle surgery    . Varicose vein surgery      left leg  . Eye surgery    . Bladder surgery  2004    baldder sling  . Spine surgery      History   Social History  . Marital Status: Widowed    Spouse Name: N/A    Number of Children: 6  . Years of Education: 8th   Occupational History  . retired    Social History Main Topics  . Smoking status: Former Smoker    Quit date: 08/24/2009  . Smokeless tobacco: Never Used  . Alcohol Use: No  . Drug Use: No  . Sexual Activity: No   Other Topics Concern  . Not on file   Social History Narrative  . No narrative on file    Family History  Problem Relation Age of Onset  . Cancer Mother     Raj JanusUterian  . Diabetes Sister   . Cancer Sister   . Hyperlipidemia Sister   . Heart attack Sister   . Hypertension Sister   . Diabetes Son   . Hyperlipidemia Daughter   . Cancer Sister  Breast  . Hyperlipidemia Sister     Allergies as of 06/18/2014 - Review Complete 06/18/2014  Allergen Reaction Noted  . Penicillins Rash 08/02/2009  . Clonidine derivatives  07/14/2013  . Maxzide [hydrochlorothiazide w-triamterene]  07/14/2013    Current Outpatient Prescriptions on File Prior to Visit  Medication Sig Dispense Refill  . amLODipine (NORVASC) 5 MG tablet Take 5 mg by mouth 2 (two) times daily.      Marland Kitchen. aspirin 81 MG tablet Take 81 mg by mouth daily.        Marland Kitchen. atorvastatin (LIPITOR) 20 MG tablet Take 1 tablet (20 mg total) by mouth daily.  90 tablet  3  . Calcium Citrate (CALCITRATE PO) Take 1 tablet by mouth daily.       . carvedilol (COREG) 3.125 MG tablet Take 1 tablet (3.125 mg total) by mouth daily.  90 tablet  3  . clopidogrel (PLAVIX) 75 MG tablet TAKE 1 TABLET BY MOUTH DAILY WITH BREAKFAST  30 tablet  4  . hydrALAZINE (APRESOLINE) 25 MG tablet Take 1.5 tablets (37.5 mg total) by mouth 3 (three) times daily.  135 tablet  3  . ibuprofen (ADVIL,MOTRIN)  200 MG tablet Take 200 mg by mouth every 6 (six) hours as needed for mild pain.       Marland Kitchen. lisinopril (PRINIVIL,ZESTRIL) 40 MG tablet TAKE ONE TABLET BY MOUTH ONE TIME DAILY  30 tablet  4  . Multiple Vitamins-Minerals (PRESERVISION AREDS) CAPS Take 1 capsule by mouth daily.        No current facility-administered medications on file prior to visit.     REVIEW OF SYSTEMS: No changes were visit 1 week ago  PHYSICAL EXAMINATION:   Vital signs are BP 182/40  Pulse 68  Temp(Src) 97.6 F (36.4 C) (Oral)  Resp 16  Ht 5\' 7"  (1.702 m)  Wt 142 lb 12.8 oz (64.774 kg)  BMI 22.36 kg/m2  SpO2 99% General: The patient appears their stated age. HEENT:  No gross abnormalities Pulmonary:  Non labored breathing Musculoskeletal: There are no major deformities. Neurologic: No focal weakness or paresthesias are detected, Skin: There are no ulcer or rashes noted. Psychiatric: The patient has normal affect. Cardiovascular: There is a regular rate and rhythm without significant murmur appreciated.   Diagnostic Studies   I have reviewed her CT angiogram of the chest and neck.  This shows 80% right carotid system.  There is also severe calcification around the innominate artery.  Definitive management of the stenosis could not be done because of the calcification.    Assessment: Asymptomatic right carotid stenosis Innominate artery stenosis Plan: I discussed with the patient and her daughter that I feel she is remaining asymptomatic and there has not been significant change by CT scan over the course of the past 3 or 4 years.  However recommend continued observation until she shows significant increase in the degree of stenosis or until she developed symptoms.  She is in agreement with this plan.  If the patient requires surgery, she will need better evaluate for innominate artery stenosis, likely with catheterization and aortic arch angiography.  I would have to consider right carotid endarterectomy with  retrograde stenting of her innominate artery stenosis  V. Charlena CrossWells Ezrael Sam IV, M.D. [facility] [place] Office: [phone] Pager:  [phone]

## 2014-07-05 ENCOUNTER — Other Ambulatory Visit: Payer: Self-pay | Admitting: *Deleted

## 2014-07-05 ENCOUNTER — Telehealth: Payer: Self-pay | Admitting: Internal Medicine

## 2014-07-05 MED ORDER — CARVEDILOL 3.125 MG PO TABS: 3.1250 mg | ORAL_TABLET | Freq: Every day | ORAL | Status: DC

## 2014-07-05 NOTE — Telephone Encounter (Signed)
Patient's daughter would like all her mother's medications to be ordered by Dr. Tenny Crawoss only. Advised that Dr. Tenny Crawoss will only prescribe cardiac related medications. States pt will be finding a new PCP soon due to moving in with her daughter.  Pt's daughter did request refill of Coreg, pt taking once daily as per office visit notes. Refilled to [facility] as per her request.

## 2014-07-05 NOTE — Telephone Encounter (Signed)
New message      Talk to a nurse regarding her mother's medications.

## 2014-08-31 ENCOUNTER — Other Ambulatory Visit: Payer: Self-pay | Admitting: Nurse Practitioner

## 2014-08-31 ENCOUNTER — Other Ambulatory Visit: Payer: Self-pay | Admitting: *Deleted

## 2014-08-31 MED ORDER — LISINOPRIL 40 MG PO TABS: 40.0000 mg | ORAL_TABLET | Freq: Every day | ORAL | Status: DC

## 2014-08-31 MED ORDER — CLOPIDOGREL BISULFATE 75 MG PO TABS: 75.0000 mg | ORAL_TABLET | Freq: Every day | ORAL | Status: DC

## 2014-09-08 ENCOUNTER — Other Ambulatory Visit: Payer: Self-pay | Admitting: Internal Medicine

## 2014-09-11 ENCOUNTER — Other Ambulatory Visit: Payer: Self-pay

## 2014-09-11 MED ORDER — AMLODIPINE BESYLATE 5 MG PO TABS: 5.0000 mg | ORAL_TABLET | Freq: Two times a day (BID) | ORAL | Status: DC

## 2014-09-18 ENCOUNTER — Other Ambulatory Visit: Payer: Self-pay | Admitting: Internal Medicine

## 2014-10-01 ENCOUNTER — Ambulatory Visit (INDEPENDENT_AMBULATORY_CARE_PROVIDER_SITE_OTHER): Payer: Medicaid Other | Admitting: Ophthalmology

## 2014-10-16 ENCOUNTER — Telehealth: Payer: Self-pay | Admitting: *Deleted

## 2014-10-16 DIAGNOSIS — Z862 Personal history of diseases of the blood and blood-forming organs and certain disorders involving the immune mechanism: Secondary | ICD-10-CM

## 2014-10-16 NOTE — Telephone Encounter (Signed)
Notes Recorded by Judy PimpleLinda M Reiland, LPN on 7/84/69629/29/2015 at 11:58 AM Patient advised of labs. She is agreeable to starting iron supplement as recommended. States daughter will call back to schedule her labwork for end of January. Notes Recorded by Pricilla RifflePaula Ross V, MD on 05/17/2014 at 10:11 AM Patient with anemia that has been chronic. I would try iron supplementation. 324 mg 2x per day. Take daily if can't tolerate BID F/U CBC In 4 month. Forward to primary.   Left message for patient to call back to make appointment for lab work.

## 2014-10-17 NOTE — Telephone Encounter (Signed)
Called back x2 Was disconnected x1 and then got voicemail. Will try back.

## 2014-10-17 NOTE — Telephone Encounter (Signed)
Follow up   ° ° ° °Daughter calling back to speak with nurse  °

## 2014-10-17 NOTE — Telephone Encounter (Signed)
Patient coming 3/11 for cbc f/u since adding iron. Per daughter she has not been taking as ordered because it causes her GI upset.  Has appointment with Dr. Tenny Crawoss 3/17. Patient has moved with daughter and hasn't established with new PCP yet.

## 2014-10-25 ENCOUNTER — Other Ambulatory Visit: Payer: Self-pay | Admitting: *Deleted

## 2014-10-25 DIAGNOSIS — I10 Essential (primary) hypertension: Secondary | ICD-10-CM

## 2014-10-25 DIAGNOSIS — E785 Hyperlipidemia, unspecified: Secondary | ICD-10-CM

## 2014-10-25 DIAGNOSIS — R002 Palpitations: Secondary | ICD-10-CM

## 2014-10-29 ENCOUNTER — Ambulatory Visit (INDEPENDENT_AMBULATORY_CARE_PROVIDER_SITE_OTHER): Payer: Medicare Other | Admitting: Ophthalmology

## 2014-10-29 DIAGNOSIS — I10 Essential (primary) hypertension: Secondary | ICD-10-CM | POA: Diagnosis not present

## 2014-10-29 DIAGNOSIS — H43813 Vitreous degeneration, bilateral: Secondary | ICD-10-CM

## 2014-10-29 DIAGNOSIS — H35033 Hypertensive retinopathy, bilateral: Secondary | ICD-10-CM | POA: Diagnosis not present

## 2014-10-29 DIAGNOSIS — H3531 Nonexudative age-related macular degeneration: Secondary | ICD-10-CM

## 2014-11-02 ENCOUNTER — Other Ambulatory Visit (INDEPENDENT_AMBULATORY_CARE_PROVIDER_SITE_OTHER): Payer: Medicare Other | Admitting: *Deleted

## 2014-11-02 DIAGNOSIS — I10 Essential (primary) hypertension: Secondary | ICD-10-CM

## 2014-11-02 DIAGNOSIS — R002 Palpitations: Secondary | ICD-10-CM

## 2014-11-02 DIAGNOSIS — Z862 Personal history of diseases of the blood and blood-forming organs and certain disorders involving the immune mechanism: Secondary | ICD-10-CM

## 2014-11-02 DIAGNOSIS — E785 Hyperlipidemia, unspecified: Secondary | ICD-10-CM

## 2014-11-02 LAB — BASIC METABOLIC PANEL
BUN: 20 mg/dL (ref 6–23)
CO2: 25 mEq/L (ref 19–32)
Calcium: 8.6 mg/dL (ref 8.4–10.5)
Chloride: 109 mEq/L (ref 96–112)
Creatinine, Ser: 1.41 mg/dL — ABNORMAL HIGH (ref 0.40–1.20)
GFR: 37.98 mL/min — AB (ref >60.00)
Glucose, Bld: 110 mg/dL — ABNORMAL HIGH (ref 70–99)
POTASSIUM: 5.1 meq/L (ref 3.5–5.1)
Sodium: 138 mEq/L (ref 135–145)

## 2014-11-02 LAB — LIPID PANEL
CHOLESTEROL: 127 mg/dL (ref 0–200)
HDL: 37.1 mg/dL — ABNORMAL LOW (ref >39.00)
LDL Cholesterol: 72 mg/dL (ref 0–99)
NonHDL: 89.9
Total CHOL/HDL Ratio: 3
Triglycerides: 91 mg/dL (ref 0.0–149.0)
VLDL: 18.2 mg/dL (ref 0.0–40.0)

## 2014-11-02 LAB — CBC
HCT: 26.7 % — ABNORMAL LOW (ref 36.0–46.0)
Hemoglobin: 8.9 g/dL — ABNORMAL LOW (ref 12.0–15.0)
MCHC: 33.3 g/dL (ref 30.0–36.0)
MCV: 86.1 fl (ref 78.0–100.0)
Platelets: 252 10*3/uL (ref 150.0–400.0)
RBC: 3.1 Mil/uL — AB (ref 3.87–5.11)
RDW: 14 % (ref 11.5–15.5)
WBC: 7.8 10*3/uL (ref 4.0–10.5)

## 2014-11-02 LAB — AST: AST: 16 U/L (ref 0–37)

## 2014-11-02 NOTE — Addendum Note (Signed)
Addended by: Tonita PhoenixBOWDEN, ROBIN K on: 11/02/2014 09:15 AM   Modules accepted: Orders

## 2014-11-08 ENCOUNTER — Ambulatory Visit (INDEPENDENT_AMBULATORY_CARE_PROVIDER_SITE_OTHER): Payer: Medicare Other | Admitting: Internal Medicine

## 2014-11-08 ENCOUNTER — Encounter: Payer: Self-pay | Admitting: Internal Medicine

## 2014-11-08 VITALS — BP 152/47 | HR 59 | Ht 67.0 in | Wt 137.0 lb

## 2014-11-08 DIAGNOSIS — R011 Cardiac murmur, unspecified: Secondary | ICD-10-CM

## 2014-11-08 MED ORDER — FERROUS SULFATE DRIED 200 (65 FE) MG PO TABS: 1.0000 | ORAL_TABLET | Freq: Every day | ORAL | Status: DC

## 2014-11-08 NOTE — Patient Instructions (Signed)
Your physician has requested that you have an echocardiogram. Echocardiography is a painless test that uses sound waves to create images of your heart. It provides your doctor with information about the size and shape of your heart and how well your heart's chambers and valves are working. This procedure takes approximately one hour. There are no restrictions for this procedure.  Your physician has recommended you make the following change in your medication:  1.) start Feosol 200 mg daily  Your physician wants you to follow-up in: 4 months with Dr. Tenny Crawoss.  You will receive a reminder letter in the mail two months in advance. If you don't receive a letter, please call our office to schedule the follow-up appointment.

## 2014-11-08 NOTE — Progress Notes (Signed)
Cardiology Office Note   Date:  11/08/2014   ID:  [name], DOB [birth year], MRN [id number]  PCP:   Duane Lopeoss, Alan, MD  Cardiologist:   Dietrich PatesPaula Amir Fick, MD   Chief Complaint  Patient presents with  . 6 MO F/U VISIT      History of Present Illness: Jessica Moralesancy E Sholtz is a 81 y.o. female with a history of Patient is a 81 yo with a history of HTN, CV disease, HL, macular degeneration. Since I saw her in the fall she has been doing failry good  No Cp  Breathing is good  Follows in vascular clinic Daughter says bp is 180 at times   Did not tolerate iron rx  Due to constipation     Current Outpatient Prescriptions  Medication Sig Dispense Refill  . amLODipine (NORVASC) 5 MG tablet Take 1 tablet (5 mg total) by mouth 2 (two) times daily. 180 tablet 0  . aspirin 81 MG tablet Take 81 mg by mouth daily.      Marland Kitchen. atorvastatin (LIPITOR) 20 MG tablet Take 1 tablet (20 mg total) by mouth daily. 90 tablet 3  . Calcium Citrate (CALCITRATE PO) Take 1 tablet by mouth daily.     . carvedilol (COREG) 3.125 MG tablet Take 1 tablet (3.125 mg total) by mouth daily. 90 tablet 2  . clopidogrel (PLAVIX) 75 MG tablet Take 1 tablet (75 mg total) by mouth daily with breakfast. 90 tablet 1  . hydrALAZINE (APRESOLINE) 25 MG tablet TAKE ONE AND ONE-HALF TABLETS BY MOUTH THREE TIMES DAILY  135 tablet 1  . lisinopril (PRINIVIL,ZESTRIL) 40 MG tablet Take 1 tablet (40 mg total) by mouth daily. 90 tablet 1  . Multiple Vitamins-Minerals (PRESERVISION AREDS) CAPS Take 1 capsule by mouth daily.     . Ferrous Sulfate Dried (FEOSOL) 200 (65 FE) MG TABS Take 1 tablet by mouth daily. 30 tablet 11  . ibuprofen (ADVIL,MOTRIN) 200 MG tablet Take 200 mg by mouth every 6 (six) hours as needed for mild pain.      No current facility-administered medications for this visit.    Allergies:   Penicillins; Clonidine derivatives; and Maxzide   Past Medical History  Diagnosis Date  . Hypertension     a. Intolerant to Maxzide due  to hyponatremia. b. intolerant to Clonidine due to slow HR/fatigue. c. Discrepancy in BP felt due to innominant stenosis.  . Hyperlipidemia   . Carotid artery occlusion     RICA 80-99%, LICA 40-59% by dopplers 06/2013. Also with cerebrovascular disease otherwise on MRA 06/2013.  . Macular degeneration     In both eyes - no longer drives.  . Arthritis   . Hiatal hernia   . Chronic kidney disease   . DVT (deep venous thrombosis)     a. Around age 81, details unclear (SVT vs DVT?), s/p vein stripping without recurrence.  . Anemia     a. "On/off my whole life" - etiology unclear. Has never required blood transfusion.  . Multiple thyroid nodules     a. By US 06/2013. Bx recommended.  Marland Kitchen. PFO (patent foramen ovale)     a. Per echo in 12/2010.  Marland Kitchen. PVD (peripheral vascular disease)     a. R innominant stenosis. b. 06/2013 ABI: 0.80 R, 0.50 L.  . Dizziness     a. Adm 06/2013: felt due to posterior circulation deficits from vertebrobasilar insufficiency and possibly vertigo.  Marland Kitchen. Heart murmur     a. Longstanding. mild MR on prior  ECG, also may be in setting of known vasc dz.  . Carotid artery stenosis   . Varicose veins     Past Surgical History  Procedure Laterality Date  . Cataract extraction    . Lumbar disc surgery    . Incontinence surgery    . Abdominal hysterectomy  1971  . Neck muscle surgery    . Varicose vein surgery      left leg  . Eye surgery    . Bladder surgery  2004    baldder sling  . Spine surgery       Social History:  The patient  reports that she quit smoking about 5 years ago. She has never used smokeless tobacco. She reports that she does not drink alcohol or use illicit drugs.   Family History:  The patient's family history includes Cancer in her mother, sister, and sister; Diabetes in her sister and son; Heart attack in her sister; Hyperlipidemia in her daughter, sister, and sister; Hypertension in her sister.    ROS:  Please see the history of present  illness. All other systems are reviewed and  Negative to the above problem except as noted.    PHYSICAL EXAM: VS:  BP 152/47 mmHg  Pulse 59  Ht  (1.702 m)  Wt 137 lb (62.143 kg)  BMI 21.45 kg/m2  GEN: Well nourished, well developed, in no acute distress HEENT: normal Neck: no JVD, carotid bruits, or masses Cardiac: RRR; Gr III/Vi sysotlic murmur LUSB  rubs, or gallops,no edema  Respiratory:  clear to auscultation bilaterally, normal work of breathing GI: soft, nontender, nondistended, + BS  No hepatomegaly  MS: no deformity Moving all extremities   Skin: warm and dry, no rash Neuro:  Strength and sensation are intact Psych: euthymic mood, full affect   EKG:  EKG is not ordered today.   Lipid Panel    Component Value Date/Time   CHOL 127 11/02/2014 0915   CHOL 169 02/19/2014 1255   CHOL 133 02/17/2013 0947   TRIG 91.0 11/02/2014 0915   TRIG CANCELED 02/19/2014 1255   TRIG 65 02/17/2013 0947   HDL 37.10* 11/02/2014 0915   HDL CANCELED 02/19/2014 1255   HDL 48 02/19/2014 1255   HDL 58 02/17/2013 0947   CHOLHDL 3 11/02/2014 0915   CHOLHDL 3.5 02/19/2014 1255   VLDL 18.2 11/02/2014 0915   LDLCALC 72 11/02/2014 0915   LDLCALC 97 02/19/2014 1255   LDLCALC 95 02/19/2014 0000   LDLCALC 62 02/17/2013 0947      Wt Readings from Last 3 Encounters:  11/08/14 137 lb (62.143 kg)  06/18/14 142 lb 12.8 oz (64.774 kg)  06/11/14 142 lb (64.411 kg)      ASSESSMENT AND PLAN: 1.   Murmur  AV sclerotic on echo in 2014  Rechekc  2.  HTn  BP still high  Will review after echo  3  HL  Good control of LDL  4  Anemia  Hgb is rel stable  Will try Fe fumarate preparatioin    5/  CV disease      Current medicines are reviewed at length with the patient today.  The patient does not have concerns regarding medicines.  The following changes have been made: Trial of Fe fumarate    Labs/ tests ordered today include:  Orders Placed This Encounter  Procedures  . 2D  Echocardiogram without contrast     Disposition:   FU with me in 4 months    Signed,  Dietrich Pates, MD  11/08/2014 9:38 AM    [facility] [address] [place], [place], [place]  [address] Phone: [phone]; Fax: [phone]

## 2014-11-22 ENCOUNTER — Other Ambulatory Visit: Payer: Self-pay | Admitting: Internal Medicine

## 2014-12-03 ENCOUNTER — Other Ambulatory Visit: Payer: Self-pay | Admitting: Internal Medicine

## 2014-12-04 ENCOUNTER — Ambulatory Visit (HOSPITAL_COMMUNITY): Payer: Medicare Other | Attending: Cardiology

## 2014-12-04 DIAGNOSIS — R011 Cardiac murmur, unspecified: Secondary | ICD-10-CM | POA: Diagnosis not present

## 2014-12-04 NOTE — Progress Notes (Signed)
2D Echo completed. 12/04/2014 

## 2014-12-07 ENCOUNTER — Other Ambulatory Visit: Payer: Self-pay | Admitting: Internal Medicine

## 2014-12-17 ENCOUNTER — Other Ambulatory Visit (HOSPITAL_COMMUNITY): Payer: Medicare Other

## 2014-12-17 ENCOUNTER — Ambulatory Visit: Payer: Medicare Other | Admitting: Surgery

## 2014-12-28 ENCOUNTER — Encounter: Payer: Self-pay | Admitting: Surgery

## 2014-12-31 ENCOUNTER — Encounter: Payer: Self-pay | Admitting: Surgery

## 2014-12-31 ENCOUNTER — Ambulatory Visit (HOSPITAL_COMMUNITY)
Admission: RE | Admit: 2014-12-31 | Discharge: 2014-12-31 | Disposition: A | Discharge status: Home / Self Care | Payer: Medicare Other | Source: Ambulatory Visit | Attending: Surgery | Admitting: Surgery

## 2014-12-31 ENCOUNTER — Ambulatory Visit (INDEPENDENT_AMBULATORY_CARE_PROVIDER_SITE_OTHER): Payer: Medicare Other | Admitting: Surgery

## 2014-12-31 VITALS — BP 189/52 | HR 59 | Ht 67.0 in | Wt 136.9 lb

## 2014-12-31 DIAGNOSIS — I6523 Occlusion and stenosis of bilateral carotid arteries: Secondary | ICD-10-CM

## 2014-12-31 NOTE — Progress Notes (Signed)
History of Present Illness:  Patient is a 81 y.o. year old female who presents for evaluation of carotid stenosis.  The patient denies symptoms of TIA, amaurosis, or stroke.  The patient is currently on Plavix and 81 mg Aspirin antiplatelet therapy.   Past medical history includes hyperlipidemia, hypertension,, and macular degeneration.  She takes a daily statin and beta blocker.    Past Medical History  Diagnosis Date  . Hypertension     a. Intolerant to Maxzide due to hyponatremia. b. intolerant to Clonidine due to slow HR/fatigue. c. Discrepancy in BP felt due to innominant stenosis.  . Hyperlipidemia   . Carotid artery occlusion     RICA 80-99%, LICA 40-59% by dopplers 06/2013. Also with cerebrovascular disease otherwise on MRA 06/2013.  . Macular degeneration     In both eyes - no longer drives.  . Arthritis   . Hiatal hernia   . Chronic kidney disease   . DVT (deep venous thrombosis)     a. Around age 81, details unclear (SVT vs DVT?), s/p vein stripping without recurrence.  . Anemia     a. "On/off my whole life" - etiology unclear. Has never required blood transfusion.  . Multiple thyroid nodules     a. By US 06/2013. Bx recommended.  Marland Kitchen. PFO (patent foramen ovale)     a. Per echo in 12/2010.  Marland Kitchen. PVD (peripheral vascular disease)     a. R innominant stenosis. b. 06/2013 ABI: 0.80 R, 0.50 L.  . Dizziness     a. Adm 06/2013: felt due to posterior circulation deficits from vertebrobasilar insufficiency and possibly vertigo.  Marland Kitchen. Heart murmur     a. Longstanding. mild MR on prior ECG, also may be in setting of known vasc dz.  . Carotid artery stenosis   . Varicose veins   . CAD (coronary artery disease)     Past Surgical History  Procedure Laterality Date  . Cataract extraction    . Lumbar disc surgery    . Incontinence surgery    . Abdominal hysterectomy  1971  . Neck muscle surgery    . Varicose vein surgery      left leg  . Eye surgery    . Bladder surgery  2004   baldder sling  . Spine surgery       Social History History  Substance Use Topics  . Smoking status: Former Smoker    Quit date: 08/24/2009  . Smokeless tobacco: Never Used  . Alcohol Use: No    Family History Family History  Problem Relation Age of Onset  . Cancer Mother     Raj JanusUterian  . Diabetes Sister   . Cancer Sister   . Hyperlipidemia Sister   . Heart attack Sister   . Hypertension Sister   . Diabetes Son   . Hyperlipidemia Daughter   . Hypertension Daughter   . Cancer Sister     Breast  . Hyperlipidemia Sister     Allergies  Allergies  Allergen Reactions  . Penicillins Rash    Oral swelling  . Clonidine Derivatives     Slow HR/fatigue   . Maxzide [Hydrochlorothiazide W-Triamterene]     hyponatremia     Current Outpatient Prescriptions  Medication Sig Dispense Refill  . amLODipine (NORVASC) 5 MG tablet TAKE ONE TABLET BY MOUTH TWICE DAILY 180 tablet 3  . aspirin 81 MG tablet Take 81 mg by mouth daily.      Marland Kitchen. atorvastatin (LIPITOR) 20 MG tablet Take  1 tablet (20 mg total) by mouth daily. 90 tablet 3  . Calcium Citrate (CALCITRATE PO) Take 1 tablet by mouth daily.     . carvedilol (COREG) 3.125 MG tablet Take 1 tablet (3.125 mg total) by mouth daily. 90 tablet 2  . cholecalciferol (VITAMIN D) 1000 UNITS tablet Take 1,000 Units by mouth daily.    . clopidogrel (PLAVIX) 75 MG tablet Take 1 tablet (75 mg total) by mouth daily with breakfast. 90 tablet 1  . Ferrous Sulfate Dried (FEOSOL) 200 (65 FE) MG TABS Take 1 tablet by mouth daily. 30 tablet 11  . hydrALAZINE (APRESOLINE) 25 MG tablet TAKE ONE AND A HALF TABLETS BY MOUTH THREE TIMES DAILY  135 tablet 4  . ibuprofen (ADVIL,MOTRIN) 200 MG tablet Take 200 mg by mouth every 6 (six) hours as needed for mild pain.     Marland Kitchen lisinopril (PRINIVIL,ZESTRIL) 40 MG tablet Take 1 tablet (40 mg total) by mouth daily. 90 tablet 1  . Multiple Vitamins-Minerals (PRESERVISION AREDS) CAPS Take 1 capsule by mouth daily.       No current facility-administered medications for this visit.    ROS:   General:  No weight loss, Fever, chills  HEENT: No recent headaches, no nasal bleeding, no visual changes, no sore throat  Neurologic: No dizziness, blackouts, seizures. No recent symptoms of stroke or mini- stroke. No recent episodes of slurred speech, or temporary blindness.  Partial vision loss   Cardiac: No recent episodes of chest pain/pressure, no shortness of breath at rest.  No shortness of breath with exertion.  Denies history of atrial fibrillation or irregular heartbeat  Vascular: No history of rest pain in feet.  No history of claudication.  No history of non-healing ulcer, No history of DVT   Pulmonary: No home oxygen, no productive cough, no hemoptysis,  No asthma or wheezing  Musculoskeletal:   Arthritis,  Low back pain,   Joint pain  Hematologic:No history of hypercoagulable state.  No history of easy bleeding.  No history of anemia  Gastrointestinal: No hematochezia or melena,  No gastroesophageal reflux, no trouble swallowing  Urinary:  chronic Kidney disease,  on HD -  MWF or  TTHS,  Burning with urination,  Frequent urination,  Difficulty urinating;   Skin: No rashes  Psychological: No history of anxiety,  No history of depression   Physical Examination  Filed Vitals:   12/31/14 1358 12/31/14 1401  BP: 158/46 189/52  Pulse: 58 59  Height:  (1.702 m)   Weight: 136 lb 14.4 oz (62.097 kg)   SpO2: 100%     Body mass index is 21.44 kg/(m^2).  General:  Alert and oriented, no acute distress HEENT: Normal other than partial vision loss Neck: positive bilateral bruit  Pulmonary: Clear to auscultation bilaterally Cardiac: Regular Rate and Rhythm with holistic  murmur Gastrointestinal: Soft, non-tender, non-distended, no mass, no scars Skin: No rash Extremity Pulses:  2+ radial, brachial, femoral, dorsalis pedis, posterior tibial pulses  bilaterally Musculoskeletal: No deformity or edema  Neurologic: Upper and lower extremity motor 5/5 and symmetric  DATA:  Carotid duplex Right ICA 70% stenosis Left 50-9% stenosis   ASSESSMENT:  Bilateral carotid stenosis asymptomatic and stable Right innominate stenosis  PLAN: At this time she is stable and asymptomatic.  She now lives with her daughter.   We will perform a repeat carotid duplex in 6 months.  If she has symptoms of stroke or TIA  she will call and go to the ED. Symptoms were reviewed today.   Thomasena EdisOLLINS, EMMA [facility] PA-C [facility] [place] Office: [phone]  She was seen in conjunction with Dr. Myra GianottiBrabham today    I agree with the above.  I have seen and evaluated the patient.  She remains asymptomatic.  She continues to have innominate artery as well as right carotid artery stenosis which is asymptomatic. Her vision secondary to her macular degeneration and has gotten worse. She was here today with her daughter.  She is now living with her. Given that there has been no significant interval change both and her ultrasound studies as well as her clinically continued surveillance is recommended with the next study in 6 months.  Durene CalWells Brabham

## 2015-01-01 ENCOUNTER — Other Ambulatory Visit: Payer: Self-pay | Admitting: *Deleted

## 2015-01-01 DIAGNOSIS — I6523 Occlusion and stenosis of bilateral carotid arteries: Secondary | ICD-10-CM

## 2015-01-25 ENCOUNTER — Telehealth: Payer: Self-pay

## 2015-01-25 NOTE — Telephone Encounter (Signed)
Per Dr Tenny Crawoss:  Echo shows normal LV pumping function.    Valves are without signif abnormality     Murmur is from blood flowing through heart No significant problem         Keep on same meds         I spoke with the pt's daughter and made her aware of 12/04/14 Echo results.

## 2015-02-18 ENCOUNTER — Other Ambulatory Visit: Payer: Self-pay | Admitting: Internal Medicine

## 2015-02-21 ENCOUNTER — Other Ambulatory Visit: Payer: Self-pay | Admitting: Internal Medicine

## 2015-02-28 ENCOUNTER — Ambulatory Visit (INDEPENDENT_AMBULATORY_CARE_PROVIDER_SITE_OTHER): Payer: Medicare Other | Admitting: Ophthalmology

## 2015-02-28 DIAGNOSIS — H43813 Vitreous degeneration, bilateral: Secondary | ICD-10-CM

## 2015-02-28 DIAGNOSIS — H35033 Hypertensive retinopathy, bilateral: Secondary | ICD-10-CM

## 2015-02-28 DIAGNOSIS — H3531 Nonexudative age-related macular degeneration: Secondary | ICD-10-CM

## 2015-03-12 ENCOUNTER — Other Ambulatory Visit: Payer: Self-pay | Admitting: Internal Medicine

## 2015-03-13 ENCOUNTER — Other Ambulatory Visit: Payer: Self-pay

## 2015-03-13 MED ORDER — HYDRALAZINE HCL 25 MG PO TABS: ORAL_TABLET | ORAL | Status: DC

## 2015-03-13 MED ORDER — ATORVASTATIN CALCIUM 20 MG PO TABS: 20.0000 mg | ORAL_TABLET | Freq: Every day | ORAL | Status: DC

## 2015-03-13 MED ORDER — CARVEDILOL 3.125 MG PO TABS: 3.1250 mg | ORAL_TABLET | Freq: Every day | ORAL | Status: DC

## 2015-03-14 ENCOUNTER — Other Ambulatory Visit: Payer: Self-pay | Admitting: Internal Medicine

## 2015-05-08 ENCOUNTER — Telehealth: Payer: Self-pay | Admitting: Internal Medicine

## 2015-05-08 NOTE — Telephone Encounter (Signed)
New Message        Pt's daughter calling stating that pt needs to have labs done tomorrow with appt but there are no orders in Epic. Please call back and advise.

## 2015-05-08 NOTE — Telephone Encounter (Signed)
Advised patient's daughter there are no orders for lab work. Once she sees Dr. Tenny Craw on Friday, if there are labs ordered she can get them drawn after the visit.

## 2015-05-08 NOTE — Progress Notes (Signed)
Cardiology Office Note   Date:  05/10/2015   ID:  [name], DOB August 18, 1931, MRN [id number]  PCP:   Duane Lope, MD  Cardiologist:   Dietrich Pates, MD    Pt presents for f/u of HTN    History of Present Illness: [name] is a 81 y.o. female with a history of HTN, CV disease, HL, macular degeneration. Since I saw her in the fall she has been doing failry good No Cp Breathing is good Follows in vascular clinic Daughter says bp is 180 at times  Did not tolerate iron rx Due to constipation  Tried fumarate I saw her in March  Set up for repeat echo to reeval aortic valve   Daughter says waking up sweating  Having trouble sleeping due to murmur in neck  No numbness/ weakness  No dizziness  Breathing is steady    Current Outpatient Prescriptions  Medication Sig Dispense Refill  . amLODipine (NORVASC) 5 MG tablet TAKE ONE TABLET BY MOUTH TWICE DAILY 180 tablet 3  . aspirin 81 MG tablet Take 81 mg by mouth daily.      Marland Kitchen atorvastatin (LIPITOR) 20 MG tablet Take 1 tablet (20 mg total) by mouth daily. 90 tablet 3  . Calcium Citrate (CALCITRATE PO) Take 1 tablet by mouth daily.     . carvedilol (COREG) 3.125 MG tablet Take 1 tablet (3.125 mg total) by mouth daily. 90 tablet 2  . cholecalciferol (VITAMIN D) 1000 UNITS tablet Take 1,000 Units by mouth daily.    . clopidogrel (PLAVIX) 75 MG tablet TAKE ONE TABLET BY MOUTH DAILY WITH BREAKFAST 90 tablet 1  . Ferrous Sulfate Dried (FEOSOL) 200 (65 FE) MG TABS Take 1 tablet by mouth daily. 30 tablet 11  . hydrALAZINE (APRESOLINE) 25 MG tablet TAKE ONE AND A HALF TABLETS BY MOUTH THREE TIMES DAILY 135 tablet 4  . ibuprofen (ADVIL,MOTRIN) 200 MG tablet Take 200 mg by mouth every 6 (six) hours as needed for mild pain.     Marland Kitchen lisinopril (PRINIVIL,ZESTRIL) 40 MG tablet TAKE ONE TABLET BY MOUTH DAILY 90 tablet 1  . Multiple Vitamins-Minerals (PRESERVISION AREDS) CAPS Take 1 capsule by mouth daily.      No current facility-administered  medications for this visit.    Allergies:   Penicillins; Clonidine derivatives; and Maxzide   Past Medical History  Diagnosis Date  . Hypertension     a. Intolerant to Maxzide due to hyponatremia. b. intolerant to Clonidine due to slow HR/fatigue. c. Discrepancy in BP felt due to innominant stenosis.  . Hyperlipidemia   . Carotid artery occlusion     RICA 80-99%, LICA 40-59% by dopplers 06/2013. Also with cerebrovascular disease otherwise on MRA 06/2013.  . Macular degeneration     In both eyes - no longer drives.  . Arthritis   . Hiatal hernia   . Chronic kidney disease   . DVT (deep venous thrombosis)     a. Around age 35, details unclear (SVT vs DVT?), s/p vein stripping without recurrence.  . Anemia     a. "On/off my whole life" - etiology unclear. Has never required blood transfusion.  . Multiple thyroid nodules     a. By [place] 06/2013. Bx recommended.  Marland Kitchen PFO (patent foramen ovale)     a. Per echo in 12/2010.  Marland Kitchen PVD (peripheral vascular disease)     a. R innominant stenosis. b. 06/2013 ABI: 0.80 R, 0.50 L.  . Dizziness     a.  Adm 06/2013: felt due to posterior circulation deficits from vertebrobasilar insufficiency and possibly vertigo.  Marland Kitchen Heart murmur     a. Longstanding. mild MR on prior ECG, also may be in setting of known vasc dz.  . Carotid artery stenosis   . Varicose veins   . CAD (coronary artery disease)     Past Surgical History  Procedure Laterality Date  . Cataract extraction    . Lumbar disc surgery    . Incontinence surgery    . Abdominal hysterectomy  1971  . Neck muscle surgery    . Varicose vein surgery      left leg  . Eye surgery    . Bladder surgery  2004    baldder sling  . Spine surgery       Social History:  The patient  reports that she quit smoking about 5 years ago. She has never used smokeless tobacco. She reports that she does not drink alcohol or use illicit drugs.   Family History:  The patient's family history includes Cancer in  her mother, sister, and sister; Diabetes in her sister and son; Heart attack in her sister; Hyperlipidemia in her daughter, sister, and sister; Hypertension in her daughter and sister.    ROS:  Please see the history of present illness. All other systems are reviewed and  Negative to the above problem except as noted.    PHYSICAL EXAM: VS:  BP 186/52 mmHg  Pulse 61  Ht 5\' 7"  (1.702 m)  Wt 142 lb 6.4 oz (64.592 kg)  BMI 22.30 kg/m2  GEN: Well nourished, well developed, in no acute distress HEENT: normal Neck: no JVD, carotid bruits, or masses Cardiac: RRR; no murmurs, rubs, or gallops,no edema  Respiratory:  clear to auscultation bilaterally, normal work of breathing GI: soft, nontender, nondistended, + BS  No hepatomegaly  MS: no deformity Moving all extremities   Skin: warm and dry, no rash Neuro:  Strength and sensation are intact Psych: euthymic mood, full affect   EKG:  EKG is ordered today.  SR 61 Rsr'; V1;    T wave inversion V3 -V6  I, II, AVF  Unchagned  Prominent voltage     Lipid Panel    Component Value Date/Time   CHOL 127 11/02/2014 0915   CHOL 169 02/19/2014 1255   CHOL 133 02/17/2013 0947   TRIG 91.0 11/02/2014 0915   TRIG CANCELED 02/19/2014 1255   TRIG 65 02/17/2013 0947   HDL 37.10* 11/02/2014 0915   HDL CANCELED 02/19/2014 1255   HDL 48 02/19/2014 1255   HDL 58 02/17/2013 0947   CHOLHDL 3 11/02/2014 0915   CHOLHDL 3.5 02/19/2014 1255   VLDL 18.2 11/02/2014 0915   LDLCALC 72 11/02/2014 0915   LDLCALC 97 02/19/2014 1255   LDLCALC 95 02/19/2014 0000   LDLCALC 62 02/17/2013 0947      Wt Readings from Last 3 Encounters:  05/10/15 142 lb 6.4 oz (64.592 kg)  12/31/14 136 lb 14.4 oz (62.097 kg)  11/08/14 137 lb (62.143 kg)      ASSESSMENT AND PLAN:  1  HTN  Increase hydralazine to 50 tid  2. PVOD  Has appt with Dr Myra Gianotti in November with carotid USN  3.  ANemia  Repeat CBC  4.  HL  Continue statin  5  Murmur  No AS on echo  Small PFO   Turbulent flow through LVOT       Signed, Dietrich Pates, MD  05/10/2015 12:04 PM  [facility] [address], [place], [place]  [address] Phone: [phone]; Fax: [phone]

## 2015-05-10 ENCOUNTER — Ambulatory Visit (INDEPENDENT_AMBULATORY_CARE_PROVIDER_SITE_OTHER): Payer: Medicare Other | Admitting: Internal Medicine

## 2015-05-10 ENCOUNTER — Encounter: Payer: Self-pay | Admitting: Internal Medicine

## 2015-05-10 VITALS — BP 186/52 | HR 61 | Ht 67.0 in | Wt 142.4 lb

## 2015-05-10 DIAGNOSIS — I10 Essential (primary) hypertension: Secondary | ICD-10-CM

## 2015-05-10 DIAGNOSIS — I6529 Occlusion and stenosis of unspecified carotid artery: Secondary | ICD-10-CM

## 2015-05-10 LAB — BASIC METABOLIC PANEL
BUN: 15 mg/dL (ref 6–23)
CHLORIDE: 105 meq/L (ref 96–112)
CO2: 24 mEq/L (ref 19–32)
Calcium: 8.5 mg/dL (ref 8.4–10.5)
Creatinine, Ser: 1.07 mg/dL (ref 0.40–1.20)
GFR: 52.16 mL/min — ABNORMAL LOW (ref >60.00)
Glucose, Bld: 100 mg/dL — ABNORMAL HIGH (ref 70–99)
POTASSIUM: 4.3 meq/L (ref 3.5–5.1)
Sodium: 136 mEq/L (ref 135–145)

## 2015-05-10 LAB — CBC
HCT: 25 % — ABNORMAL LOW (ref 36.0–46.0)
Hemoglobin: 8.3 g/dL — ABNORMAL LOW (ref 12.0–15.0)
MCHC: 33.2 g/dL (ref 30.0–36.0)
MCV: 90.8 fl (ref 78.0–100.0)
PLATELETS: 273 10*3/uL (ref 150.0–400.0)
RBC: 2.75 Mil/uL — ABNORMAL LOW (ref 3.87–5.11)
RDW: 15 % (ref 11.5–15.5)
WBC: 7.1 10*3/uL (ref 4.0–10.5)

## 2015-05-10 LAB — TSH: TSH: 2.87 u[IU]/mL (ref 0.35–4.50)

## 2015-05-10 MED ORDER — HYDRALAZINE HCL 25 MG PO TABS: 50.0000 mg | ORAL_TABLET | Freq: Three times a day (TID) | ORAL | Status: DC

## 2015-05-10 NOTE — Patient Instructions (Signed)
Your physician has recommended you make the following change in your medication:  1.) INCREASE HYDRALAZINE TO 50 MG THREE TIMES DAILY  Your physician recommends that you return for lab work TODAY (CBC, BMET, TSH)  Your physician wants you to follow-up in: 6 MONTHS WITH DR ROSS.  You will receive a reminder letter in the mail two months in advance. If you don't receive a letter, please call our office to schedule the follow-up appointment.

## 2015-05-13 ENCOUNTER — Other Ambulatory Visit: Payer: Self-pay | Admitting: Internal Medicine

## 2015-05-15 ENCOUNTER — Telehealth: Payer: Self-pay | Admitting: Internal Medicine

## 2015-05-15 NOTE — Telephone Encounter (Signed)
New message ° ° ° ° ° °Returning [name]'s call from yesterday °

## 2015-05-16 NOTE — Telephone Encounter (Signed)
Called regarding lab results.  Left detailed message on self identified voice mail of Mady Gemma-- [phone]. OK'd per [facility] 10/2014.

## 2015-05-27 ENCOUNTER — Telehealth: Payer: Self-pay | Admitting: Internal Medicine

## 2015-05-27 NOTE — Telephone Encounter (Signed)
New message      Question about dosage of amlodipine.  Pharmacist says t.i.d and pt thought Dr Tenny Craw said b.i.d.  Which is correct?

## 2015-05-28 NOTE — Telephone Encounter (Signed)
Spoke with Heidi. The bottle of amlodipine does say twice daily. She wrote on the bottle with a marker. After a second look she sees that the directions are correct.  Amlodipine 5 mg twice daily. She is appreciative for the call back.

## 2015-05-30 ENCOUNTER — Encounter (INDEPENDENT_AMBULATORY_CARE_PROVIDER_SITE_OTHER): Payer: Medicare Other | Admitting: Ophthalmology

## 2015-05-30 DIAGNOSIS — H353231 Exudative age-related macular degeneration, bilateral, with active choroidal neovascularization: Secondary | ICD-10-CM

## 2015-05-30 DIAGNOSIS — H43813 Vitreous degeneration, bilateral: Secondary | ICD-10-CM | POA: Diagnosis not present

## 2015-05-30 DIAGNOSIS — I10 Essential (primary) hypertension: Secondary | ICD-10-CM | POA: Diagnosis not present

## 2015-05-30 DIAGNOSIS — H35033 Hypertensive retinopathy, bilateral: Secondary | ICD-10-CM

## 2015-07-08 ENCOUNTER — Ambulatory Visit: Payer: Self-pay | Admitting: Surgery

## 2015-07-08 ENCOUNTER — Ambulatory Visit (HOSPITAL_COMMUNITY)
Admission: RE | Admit: 2015-07-08 | Discharge: 2015-07-08 | Disposition: A | Discharge status: Home / Self Care | Payer: Medicare Other | Source: Ambulatory Visit | Attending: Surgery | Admitting: Surgery

## 2015-07-08 DIAGNOSIS — I129 Hypertensive chronic kidney disease with stage 1 through stage 4 chronic kidney disease, or unspecified chronic kidney disease: Secondary | ICD-10-CM | POA: Diagnosis not present

## 2015-07-08 DIAGNOSIS — I708 Atherosclerosis of other arteries: Secondary | ICD-10-CM | POA: Diagnosis not present

## 2015-07-08 DIAGNOSIS — I6523 Occlusion and stenosis of bilateral carotid arteries: Secondary | ICD-10-CM

## 2015-07-08 DIAGNOSIS — N189 Chronic kidney disease, unspecified: Secondary | ICD-10-CM | POA: Insufficient documentation

## 2015-07-24 ENCOUNTER — Encounter: Payer: Self-pay | Admitting: Surgery

## 2015-07-29 ENCOUNTER — Encounter: Payer: Self-pay | Admitting: Surgery

## 2015-07-29 ENCOUNTER — Ambulatory Visit (INDEPENDENT_AMBULATORY_CARE_PROVIDER_SITE_OTHER): Payer: Medicare Other | Admitting: Surgery

## 2015-07-29 VITALS — BP 140/54 | HR 63 | Temp 98.0°F | Resp 16 | Ht 67.0 in | Wt 136.0 lb

## 2015-07-29 DIAGNOSIS — I771 Stricture of artery: Secondary | ICD-10-CM | POA: Diagnosis not present

## 2015-07-29 NOTE — Progress Notes (Signed)
[facility]  Patient name: [name] MRN: [id number] DOB: [birth year] Sex: female  REASON FOR VISIT: follow-up  HPI: [name] is a 81 y.o. female who here for follow-up of her bilateral carotid artery stenosis and known innominate stenosis. Her most recent duplex revealed right ICA stenosis of 70% and 50-69% left ICA stenosis. The patient denies any TIA or stroke symptoms. She denies any sudden weakness or numbness of her extremities. She has macular degeneration but denies any amaurosis fugax. She denies receptive or expressive aphasia.   She mainly complains of back pain and pain in her legs.  Past Medical History  Diagnosis Date  . Hypertension     a. Intolerant to Maxzide due to hyponatremia. b. intolerant to Clonidine due to slow HR/fatigue. c. Discrepancy in BP felt due to innominant stenosis.  . Hyperlipidemia   . Carotid artery occlusion     RICA 80-99%, LICA 40-59% by dopplers 06/2013. Also with cerebrovascular disease otherwise on MRA 06/2013.  . Macular degeneration     In both eyes - no longer drives.  . Arthritis   . Hiatal hernia   . Chronic kidney disease   . DVT (deep venous thrombosis) (HCC)     a. Around age 69, details unclear (SVT vs DVT?), s/p vein stripping without recurrence.  . Anemia     a. "On/off my whole life" - etiology unclear. Has never required blood transfusion.  . Multiple thyroid nodules     a. By [place] 06/2013. Bx recommended.  Marland Kitchen PFO (patent foramen ovale)     a. Per echo in 12/2010.  Marland Kitchen PVD (peripheral vascular disease) (HCC)     a. R innominant stenosis. b. 06/2013 ABI: 0.80 R, 0.50 L.  . Dizziness     a. Adm 06/2013: felt due to posterior circulation deficits from vertebrobasilar insufficiency and possibly vertigo.  Marland Kitchen Heart murmur     a. Longstanding. mild MR on prior ECG, also may be in setting of known vasc dz.  . Carotid artery stenosis   . Varicose veins   . CAD (coronary artery disease)      Family History  Problem Relation Age of Onset  . Cancer Mother     [name]  . Diabetes Sister   . Cancer Sister   . Hyperlipidemia Sister   . Heart attack Sister   . Hypertension Sister   . Diabetes Son   . Hyperlipidemia Daughter   . Hypertension Daughter   . Cancer Sister     Breast  . Hyperlipidemia Sister     SOCIAL HISTORY: Social History  Substance Use Topics  . Smoking status: Former Smoker    Quit date: 08/24/2009  . Smokeless tobacco: Never Used  . Alcohol Use: No    Allergies  Allergen Reactions  . Penicillins Rash    Oral swelling  . Clonidine Derivatives     Slow HR/fatigue   . Maxzide [Hydrochlorothiazide W-Triamterene]     hyponatremia    Current Outpatient Prescriptions  Medication Sig Dispense Refill  . amLODipine (NORVASC) 5 MG tablet TAKE ONE TABLET BY MOUTH TWICE DAILY 180 tablet 3  . aspirin 81 MG tablet Take 81 mg by mouth daily.      Marland Kitchen atorvastatin (LIPITOR) 20 MG tablet Take 1 tablet (20 mg total) by mouth daily. 90 tablet 3  . Calcium Citrate (CALCITRATE PO) Take 1 tablet by mouth daily.     . carvedilol (COREG) 3.125 MG tablet Take 1  tablet (3.125 mg total) by mouth daily. 90 tablet 2  . cholecalciferol (VITAMIN D) 1000 UNITS tablet Take 1,000 Units by mouth daily.    . clopidogrel (PLAVIX) 75 MG tablet TAKE ONE TABLET BY MOUTH DAILY WITH BREAKFAST 90 tablet 1  . Ferrous Sulfate Dried (FEOSOL) 200 (65 FE) MG TABS Take 1 tablet by mouth daily. 30 tablet 11  . hydrALAZINE (APRESOLINE) 25 MG tablet Take 2 tablets (50 mg total) by mouth 3 (three) times daily. 180 tablet 6  . ibuprofen (ADVIL,MOTRIN) 200 MG tablet Take 200 mg by mouth every 6 (six) hours as needed for mild pain.     Marland Kitchen. lisinopril (PRINIVIL,ZESTRIL) 40 MG tablet TAKE ONE TABLET BY MOUTH DAILY 90 tablet 1  . Multiple Vitamins-Minerals (PRESERVISION AREDS) CAPS Take 1 capsule by mouth daily.      No current facility-administered medications for this visit.    REVIEW OF  SYSTEMS:  [X]  denotes positive finding, [ ]  denotes negative finding Cardiac  Comments:  Chest pain or chest pressure:    Shortness of breath upon exertion:    Short of breath when lying flat:    Irregular heart rhythm:        Vascular    Pain in calf, thigh, or hip brought on by ambulation: x   Pain in feet at night that wakes you up from your sleep:     Blood clot in your veins:    Leg swelling:  x       Pulmonary    Oxygen at home:    Productive cough:     Wheezing:         Neurologic    Sudden weakness in arms or legs:     Sudden numbness in arms or legs:     Sudden onset of difficulty speaking or slurred speech:    Temporary loss of vision in one eye:     Problems with dizziness:  x       Gastrointestinal    Blood in stool:     Vomited blood:         Genitourinary    Burning when urinating:     Blood in urine:        Psychiatric    Major depression:         Hematologic    Bleeding problems:    Problems with blood clotting too easily:        Skin    Rashes or ulcers:        Constitutional    Fever or chills:      PHYSICAL EXAM: Filed Vitals:   07/29/15 1426 07/29/15 1430 07/29/15 1434 07/29/15 1436  BP: 153/52 179/55 177/51 140/54  Pulse: 66 66 63 63  Temp:  98 F (36.7 C)    TempSrc:  Oral    Resp:  16    Height:  5\' 7"  (1.702 m)    Weight:  136 lb (61.689 kg)    SpO2:  100%      GENERAL: The patient is a well-nourished female, in no acute distress. The vital signs are documented above. CARDIAC: There is a regular rate and rhythm.  VASCULAR: Bilateral carotid bruits, 2+ radial pulses bilaterally  PULMONARY: There is good air exchange bilaterally without wheezing or rales. MUSCULOSKELETAL: There are no major deformities or cyanosis. NEUROLOGIC: No focal weakness or paresthesias are detected. 5/5 strength upper and lower extremities. CN II-XII grossly intact.  SKIN: There are no ulcers or rashes noted. PSYCHIATRIC:  The patient has a normal  affect.  DATA:  Carotid duplex (07/08/2015)  Right: possible occlusion of right common carotid artery. The internal carotid artery stenosis could not be accurately evaluated. Vertebral artery is antegrade. Subclavian artery stenosis observed.   Left: 60-79% internal carotid artery stenosis with end diastolic velocity of 102 cm/s. Vertebral artery is stenotic. Subclavian artery stenosis is observed.   MEDICAL ISSUES: Bilateral carotid artery stenosis Innominate artery stenosis  The patient has remained asymptomatic without evidence of TIA or stroke symptoms. There is progression of her right carotid stenosis since her last duplex six months ago. It is unclear whether her right common carotid artery is now occluded or she has  Had progression of her innominate stenosis. She will need a CTA chest and neck for further evaluation. If the right carotid artery is occluded, no intervention is necessary. If the right carotid artery is still open, may consider endarterectomy. She will follow-up after her CTA. She is on maximal medical management with ASA, a statin and plavix.   Raymond Gurney [facility]    I agree with the above.  I seen and evaluated the patient.  I have been following her for innominate stenosis as well as right carotid artery stenosis.  She appears to have had progression of her carotid lesion and possibly her innominate lesion, suggesting possible occlusion.  Fortunately, she has remained completely asymptomatic.  In order to determine if there is a complete occlusion or near total occlusion, I am initially ordering a CT scan.  The patient will follow up after that.  Durene Cal

## 2015-07-29 NOTE — Progress Notes (Signed)
Filed Vitals:   07/29/15 1426 07/29/15 1430 07/29/15 1434 07/29/15 1436  BP: 153/52 179/55 177/51 140/54  Pulse: 66 66 63 63  Temp:  98 F (36.7 C)    TempSrc:  Oral    Resp:  16    Height:  5\' 7"  (1.702 m)    Weight:  136 lb (61.689 kg)    SpO2:  100%

## 2015-07-29 NOTE — Addendum Note (Signed)
Addended by: Adria DillELDRIDGE-LEWIS, Jasiel Apachito L on: 07/29/2015 04:58 PM   Modules accepted: Orders

## 2015-08-08 ENCOUNTER — Other Ambulatory Visit: Payer: Self-pay | Admitting: Surgery

## 2015-08-08 LAB — CREATININE, SERUM: CREATININE: 1.1 mg/dL — AB (ref 0.60–0.88)

## 2015-08-13 ENCOUNTER — Ambulatory Visit
Admission: RE | Admit: 2015-08-13 | Discharge: 2015-08-13 | Disposition: A | Discharge status: Home / Self Care | Payer: Medicare Other | Source: Ambulatory Visit | Attending: Surgery | Admitting: Surgery

## 2015-08-13 ENCOUNTER — Other Ambulatory Visit: Payer: Self-pay | Admitting: Internal Medicine

## 2015-08-13 DIAGNOSIS — I771 Stricture of artery: Secondary | ICD-10-CM

## 2015-08-13 MED ORDER — IOPAMIDOL (ISOVUE-370) INJECTION 76%
100.0000 mL | Freq: Once | INTRAVENOUS | Status: AC | PRN
  Administered 2015-08-13: 100 mL via INTRAVENOUS

## 2015-08-22 ENCOUNTER — Encounter: Payer: Self-pay | Admitting: Surgery

## 2015-08-23 ENCOUNTER — Other Ambulatory Visit: Payer: Self-pay | Admitting: Internal Medicine

## 2015-09-02 ENCOUNTER — Ambulatory Visit: Payer: Self-pay | Admitting: Surgery

## 2015-09-27 ENCOUNTER — Encounter (INDEPENDENT_AMBULATORY_CARE_PROVIDER_SITE_OTHER): Payer: Medicare Other | Admitting: Ophthalmology

## 2015-09-27 DIAGNOSIS — H353231 Exudative age-related macular degeneration, bilateral, with active choroidal neovascularization: Secondary | ICD-10-CM | POA: Diagnosis not present

## 2015-09-27 DIAGNOSIS — I10 Essential (primary) hypertension: Secondary | ICD-10-CM | POA: Diagnosis not present

## 2015-09-27 DIAGNOSIS — H43813 Vitreous degeneration, bilateral: Secondary | ICD-10-CM | POA: Diagnosis not present

## 2015-09-27 DIAGNOSIS — H35033 Hypertensive retinopathy, bilateral: Secondary | ICD-10-CM

## 2015-09-30 ENCOUNTER — Ambulatory Visit: Payer: Self-pay | Admitting: Surgery

## 2015-10-02 ENCOUNTER — Encounter: Payer: Self-pay | Admitting: Surgery

## 2015-10-04 ENCOUNTER — Encounter: Payer: Self-pay | Admitting: Surgery

## 2015-10-04 ENCOUNTER — Ambulatory Visit (INDEPENDENT_AMBULATORY_CARE_PROVIDER_SITE_OTHER): Payer: Medicare Other | Admitting: Surgery

## 2015-10-04 VITALS — BP 128/59 | HR 62 | Temp 96.9°F | Resp 16 | Ht 67.0 in | Wt 131.0 lb

## 2015-10-04 DIAGNOSIS — I6523 Occlusion and stenosis of bilateral carotid arteries: Secondary | ICD-10-CM | POA: Diagnosis not present

## 2015-10-04 NOTE — Progress Notes (Signed)
Patient name: [name] MRN: [id number] DOB: 03-20-1931 Sex: female     Chief Complaint  Patient presents with  . Carotid    f/u    HISTORY OF PRESENT ILLNESS: [name] is a 82 y.o. female who here for follow-up of her bilateral carotid artery stenosis and known innominate stenosis. Her most recent duplex revealed right ICA stenosis of 70% and 50-69% left ICA stenosis. The patient denies any TIA or stroke symptoms. She denies any sudden weakness or numbness of her extremities. She has macular degeneration but denies any amaurosis fugax. She denies receptive or expressive aphasia. She is back today to discuss her CT scan findings.  There have been no interval changes.  Past Medical History  Diagnosis Date  . Hypertension     a. Intolerant to Maxzide due to hyponatremia. b. intolerant to Clonidine due to slow HR/fatigue. c. Discrepancy in BP felt due to innominant stenosis.  . Hyperlipidemia   . Carotid artery occlusion     RICA 80-99%, LICA 40-59% by dopplers 06/2013. Also with cerebrovascular disease otherwise on MRA 06/2013.  . Macular degeneration     In both eyes - no longer drives.  . Arthritis   . Hiatal hernia   . Chronic kidney disease   . DVT (deep venous thrombosis) (HCC)     a. Around age 82, details unclear (SVT vs DVT?), s/p vein stripping without recurrence.  . Anemia     a. "On/off my whole life" - etiology unclear. Has never required blood transfusion.  . Multiple thyroid nodules     a. By US 06/2013. Bx recommended.  Marland Kitchen. PFO (patent foramen ovale)     a. Per echo in 12/2010.  Marland Kitchen. PVD (peripheral vascular disease) (HCC)     a. R innominant stenosis. b. 06/2013 ABI: 0.80 R, 0.50 L.  . Dizziness     a. Adm 06/2013: felt due to posterior circulation deficits from vertebrobasilar insufficiency and possibly vertigo.  Marland Kitchen. Heart murmur     a. Longstanding. mild MR on prior ECG, also may be in setting of known vasc dz.  . Carotid artery stenosis   . Varicose  veins   . CAD (coronary artery disease)     Past Surgical History  Procedure Laterality Date  . Cataract extraction    . Lumbar disc surgery    . Incontinence surgery    . Abdominal hysterectomy  1971  . Neck muscle surgery    . Varicose vein surgery      left leg  . Eye surgery    . Bladder surgery  2004    baldder sling  . Spine surgery      Social History   Social History  . Marital Status: Widowed    Spouse Name: N/A  . Number of Children: 6  . Years of Education: 8th   Occupational History  . retired    Social History Main Topics  . Smoking status: Former Smoker    Quit date: 08/24/2009  . Smokeless tobacco: Never Used  . Alcohol Use: No  . Drug Use: No  . Sexual Activity: No   Other Topics Concern  . Not on file   Social History Narrative    Family History  Problem Relation Age of Onset  . Cancer Mother     Raj JanusUterian  . Diabetes Sister   . Cancer Sister   . Hyperlipidemia Sister   . Heart attack Sister   . Hypertension Sister   .  Diabetes Son   . Hyperlipidemia Daughter   . Hypertension Daughter   . Cancer Sister     Breast  . Hyperlipidemia Sister     Allergies as of 10/04/2015 - Review Complete 10/04/2015  Allergen Reaction Noted  . Penicillins Rash 08/02/2009  . Clonidine derivatives  07/14/2013  . Maxzide [hydrochlorothiazide w-triamterene]  07/14/2013    Current Outpatient Prescriptions on File Prior to Visit  Medication Sig Dispense Refill  . amLODipine (NORVASC) 5 MG tablet TAKE ONE TABLET BY MOUTH TWICE DAILY 180 tablet 3  . aspirin 81 MG tablet Take 81 mg by mouth daily.      Marland Kitchen atorvastatin (LIPITOR) 20 MG tablet Take 1 tablet (20 mg total) by mouth daily. 90 tablet 3  . Calcium Citrate (CALCITRATE PO) Take 1 tablet by mouth daily.     . carvedilol (COREG) 3.125 MG tablet Take 1 tablet (3.125 mg total) by mouth daily. 90 tablet 2  . cholecalciferol (VITAMIN D) 1000 UNITS tablet Take 1,000 Units by mouth daily.    . clopidogrel  (PLAVIX) 75 MG tablet TAKE ONE TABLET BY MOUTH DAILY WITH BREAKFAST 90 tablet 1  . ferrous sulfate 325 (65 FE) MG tablet TAKE ONE TABLET BY MOUTH ONE TIME DAILY 30 tablet 7  . Ferrous Sulfate Dried (FEOSOL) 200 (65 FE) MG TABS Take 1 tablet by mouth daily. 30 tablet 11  . hydrALAZINE (APRESOLINE) 25 MG tablet Take 2 tablets (50 mg total) by mouth 3 (three) times daily. 180 tablet 6  . ibuprofen (ADVIL,MOTRIN) 200 MG tablet Take 200 mg by mouth every 6 (six) hours as needed for mild pain.     Marland Kitchen lisinopril (PRINIVIL,ZESTRIL) 40 MG tablet TAKE ONE TABLET BY MOUTH DAILY 90 tablet 1  . Multiple Vitamins-Minerals (PRESERVISION AREDS) CAPS Take 1 capsule by mouth daily.      No current facility-administered medications on file prior to visit.     REVIEW OF SYSTEMS:  denotes positive finding,  denotes negative finding Cardiac  Comments:  Chest pain or chest pressure:    Shortness of breath upon exertion:    Short of breath when lying flat:    Irregular heart rhythm:        Vascular    Pain in calf, thigh, or hip brought on by ambulation: x   Pain in feet at night that wakes you up from your sleep:     Blood clot in your veins:    Leg swelling:  x       Pulmonary    Oxygen at home:    Productive cough:     Wheezing:         Neurologic    Sudden weakness in arms or legs:     Sudden numbness in arms or legs:     Sudden onset of difficulty speaking or slurred speech:    Temporary loss of vision in one eye:     Problems with dizziness:  x       Gastrointestinal    Blood in stool:     Vomited blood:         Genitourinary    Burning when urinating:     Blood in urine:        Psychiatric    Major depression:         Hematologic    Bleeding problems:    Problems with blood clotting too easily:        Skin    Rashes or ulcers:  Constitutional     Fever or chills:             PHYSICAL EXAMINATION:   Vital signs are  Filed Vitals:   10/04/15 1412 10/04/15 1418  BP: 161/65 128/59  Pulse: 62 62  Temp: 96.9 F (36.1 C)   Resp: 16   Height:  (1.702 m)   Weight: 131 lb (59.421 kg)   SpO2: 99%    Body mass index is 20.51 kg/(m^2). General: The patient appears their stated age. HEENT:  No gross abnormalities Pulmonary:  Non labored breathing Musculoskeletal: There are no major deformities. Neurologic: No focal weakness or paresthesias are detected, Skin: There are no ulcer or rashes noted. Psychiatric: The patient has normal affect. Cardiovascular: There is a regular rate and rhythm without significant murmur appreciated.   Diagnostic Studies  I have reviewed her CT scan with the following findings Very severe heavily calcified plaque affecting the aortic arch, proximal brachiocephalic vessels and vessels of the neck. In comparing to previous studies, the appearance is similar, with the exception that there is further decrease in size of the right cervical ICA compared to the previous study secondary to inflow reduction.  50% stenosis of the arch itself because of exuberant calcified plaque.  Very severe stenoses of the proximal innominate artery, left common carotid artery and left subclavian artery. Difficult to measure precisely because of the exuberant nature of the plaque. I suspect that the stenoses are 75% or greater in all these regions.  Extensive calcified plaque at the carotid bifurcation on the right. Very severe stenosis of the proximal ICA with luminal diameter probably 1 mm or less. Flow reduction results in a very small size of the right cervical internal carotid artery, reduced since previous study. Antegrade flow does persist however.  Extensive calcified plaque at the left carotid bifurcation. 80% stenosis of the proximal ICA on the left with a normal caliber of the more  distal cervical ICA on the left.  Severe stenoses at both vertebral artery origins. Dominant right vertebral artery shows 70% stenosis at the origin and is widely patent beyond that to the basilar. 50% stenosis of the proximal basilar artery. Non dominant left vertebral artery shows 70% stenosis at its origin with serial stenoses beyond that including a severe stenosis at the C4 level, 80% or greater. This vessel does continue to show antegrade flow however.  Assessment:  asymptomatic right carotid stenosis Plan:  after reviewing her CT scan and ultrasound, she has had progression of the stenosis on the right extracranial carotid artery. Because of the progression, I have recommended proceeding with carotid endarterectomy. We discussed that I do not think she is a good candidate for treatment of her innominate stenosis at this time.  Rather focus on a extracranial carotid endarterectomy. Velna Hatchet to discuss this further with her children but will most likely call to schedule this in the immediate future. We discussed the risks and benefits of the operation including the risk of stroke and nerve injury.  I discussed the details of the procedure. She will need to be off Plavix for 5 days.  Jorge Ny, M.D. [facility] Office: [phone] Pager:  [phone]

## 2015-10-04 NOTE — Progress Notes (Signed)
Filed Vitals:   10/04/15 1412 10/04/15 1418  BP: 161/65 128/59  Pulse: 62 62  Temp: 96.9 F (36.1 C)   Resp: 16   Height:  (1.702 m)   Weight: 131 lb (59.421 kg)   SpO2: 99%

## 2015-10-07 ENCOUNTER — Other Ambulatory Visit: Payer: Self-pay | Admitting: Internal Medicine

## 2015-10-07 MED ORDER — HYDRALAZINE HCL 25 MG PO TABS: 50.0000 mg | ORAL_TABLET | Freq: Three times a day (TID) | ORAL | Status: DC

## 2015-10-10 ENCOUNTER — Other Ambulatory Visit: Payer: Self-pay

## 2015-10-10 ENCOUNTER — Telehealth: Payer: Self-pay | Admitting: Internal Medicine

## 2015-10-10 NOTE — Telephone Encounter (Signed)
Placed in nurse fax bin in medical records to be faxed. 

## 2015-10-10 NOTE — Telephone Encounter (Signed)
Pt was seen in clinic 04/2015 by Dr. Tenny Craw. Forwarding to MD for approval to stop Plavix.

## 2015-10-10 NOTE — Telephone Encounter (Signed)
OK to stop plavix prior to surgery  She is on it for neuro reasons.

## 2015-10-10 NOTE — Telephone Encounter (Signed)
Needs permission to stop Plavix for 5 days prior to surgery on 10-31-15. Please fax to-[phone] [email or web address]

## 2015-10-24 ENCOUNTER — Encounter (HOSPITAL_COMMUNITY): Payer: Self-pay

## 2015-10-24 ENCOUNTER — Encounter (HOSPITAL_COMMUNITY)
Admission: RE | Admit: 2015-10-24 | Discharge: 2015-10-24 | Disposition: A | Discharge status: Home / Self Care | Payer: Medicare Other | Source: Ambulatory Visit | Attending: Surgery | Admitting: Surgery

## 2015-10-24 ENCOUNTER — Other Ambulatory Visit (HOSPITAL_COMMUNITY): Payer: Self-pay | Admitting: *Deleted

## 2015-10-24 DIAGNOSIS — Z01812 Encounter for preprocedural laboratory examination: Secondary | ICD-10-CM | POA: Diagnosis not present

## 2015-10-24 DIAGNOSIS — I6521 Occlusion and stenosis of right carotid artery: Secondary | ICD-10-CM | POA: Insufficient documentation

## 2015-10-24 DIAGNOSIS — Z0183 Encounter for blood typing: Secondary | ICD-10-CM | POA: Insufficient documentation

## 2015-10-24 HISTORY — DX: Major depressive disorder, single episode, unspecified: F32.9

## 2015-10-24 HISTORY — DX: Depression, unspecified: F32.A

## 2015-10-24 LAB — TYPE AND SCREEN
ABO/RH(D): A POS
Antibody Screen: NEGATIVE

## 2015-10-24 LAB — CBC
HCT: 28.8 % — ABNORMAL LOW (ref 36.0–46.0)
Hemoglobin: 9.3 g/dL — ABNORMAL LOW (ref 12.0–15.0)
MCH: 29.8 pg (ref 26.0–34.0)
MCHC: 32.3 g/dL (ref 30.0–36.0)
MCV: 92.3 fL (ref 78.0–100.0)
PLATELETS: 265 10*3/uL (ref 150–400)
RBC: 3.12 MIL/uL — AB (ref 3.87–5.11)
RDW: 13.8 % (ref 11.5–15.5)
WBC: 7.6 10*3/uL (ref 4.0–10.5)

## 2015-10-24 LAB — URINALYSIS, ROUTINE W REFLEX MICROSCOPIC
Bilirubin Urine: NEGATIVE
GLUCOSE, UA: 100 mg/dL — AB
HGB URINE DIPSTICK: NEGATIVE
Ketones, ur: NEGATIVE mg/dL
Nitrite: POSITIVE — AB
PH: 5.5 (ref 5.0–8.0)
Protein, ur: >300 mg/dL — AB
Specific Gravity, Urine: 1.017 (ref 1.005–1.030)

## 2015-10-24 LAB — COMPREHENSIVE METABOLIC PANEL
ALK PHOS: 91 U/L (ref 38–126)
ALT: 15 U/L (ref 14–54)
ANION GAP: 8 (ref 5–15)
AST: 24 U/L (ref 15–41)
Albumin: 2.7 g/dL — ABNORMAL LOW (ref 3.5–5.0)
BUN: 14 mg/dL (ref 6–20)
CALCIUM: 8.5 mg/dL — AB (ref 8.9–10.3)
CHLORIDE: 113 mmol/L — AB (ref 101–111)
CO2: 22 mmol/L (ref 22–32)
CREATININE: 1.27 mg/dL — AB (ref 0.44–1.00)
GFR, EST AFRICAN AMERICAN: 44 mL/min — AB (ref >60)
GFR, EST NON AFRICAN AMERICAN: 38 mL/min — AB (ref >60)
Glucose, Bld: 116 mg/dL — ABNORMAL HIGH (ref 65–99)
Potassium: 4.2 mmol/L (ref 3.5–5.1)
SODIUM: 143 mmol/L (ref 135–145)
Total Bilirubin: 0.7 mg/dL (ref 0.3–1.2)
Total Protein: 5.5 g/dL — ABNORMAL LOW (ref 6.5–8.1)

## 2015-10-24 LAB — APTT: aPTT: 33 seconds (ref 24–37)

## 2015-10-24 LAB — PROTIME-INR
INR: 0.97 (ref 0.00–1.49)
PROTHROMBIN TIME: 13.1 s (ref 11.6–15.2)

## 2015-10-24 LAB — URINE MICROSCOPIC-ADD ON

## 2015-10-24 LAB — SURGICAL PCR SCREEN
MRSA, PCR: NEGATIVE
Staphylococcus aureus: NEGATIVE

## 2015-10-24 LAB — ABO/RH: ABO/RH(D): A POS

## 2015-10-24 NOTE — Pre-Procedure Instructions (Signed)
[name]  10/24/2015      [id number] [place], [place] - [address] [place]  [address] Phone: [phone] Fax: [phone]  [facility] [address] [place], [place] - [id number] [place]  [address] Phone: [phone] Fax: [phone]    Your procedure is scheduled on 10/31/2015  Report to [facility] Admitting at 5:30 A.M.  Call this number if you have problems the morning of surgery:  [phone]   Remember:  Do not eat food or drink liquids after midnight.   On Wednesday   Take these medicines the morning of surgery with A SIP OF WATER : NORVASC, HYDRALAZINE   Do not wear jewelry, make-up or nail polish.   Do not wear lotions, powders, or perfumes.  You may wear deodorant.   Do not shave 48 hours prior to surgery.    Do not bring valuables to the hospital.   [facility] is not responsible for any belongings or valuables.  Contacts, dentures or bridgework may not be worn into surgery.  Leave your suitcase in the car.  After surgery it may be brought to your room.  For patients admitted to the hospital, discharge time will be determined by your treatment team.  Patients discharged the day of surgery will not be allowed to drive home.   Name and phone number of your driver:   With family  Special instructions:  Special Instructions: [facility] - Preparing for Surgery  Before surgery, you can play an important role.  Because skin is not sterile, your skin needs to be as free of germs as possible.  You can reduce the number of germs on you skin by washing with CHG (chlorahexidine gluconate) soap before surgery.  CHG is an antiseptic cleaner which kills germs and bonds with the skin to continue killing germs even after washing.  Please DO NOT use if you have an allergy to CHG or antibacterial soaps.  If your skin becomes reddened/irritated stop using the CHG and inform your nurse when  you arrive at Short Stay.  Do not shave (including legs and underarms) for at least 48 hours prior to the first CHG shower.  You may shave your face.  Please follow these instructions carefully:   1.  Shower with CHG Soap the night before surgery and the  morning of Surgery.  2.  If you choose to wash your hair, wash your hair first as usual with your  normal shampoo.  3.  After you shampoo, rinse your hair and body thoroughly to remove the  Shampoo.  4.  Use CHG as you would any other liquid soap.  You can apply chg directly to the skin and wash gently with scrungie or a clean washcloth.  5.  Apply the CHG Soap to your body ONLY FROM THE NECK DOWN.    Do not use on open wounds or open sores.  Avoid contact with your eyes, ears, mouth and genitals (private parts).  Wash genitals (private parts)   with your normal soap.  6.  Wash thoroughly, paying special attention to the area where your surgery will be performed.  7.  Thoroughly rinse your body with warm water from the neck down.  8.  DO NOT shower/wash with your normal soap after using and rinsing off   the CHG Soap.  9.  Pat yourself dry with a clean towel.  10.  Wear clean pajamas.            11.  Place clean sheets on your bed the night of your first shower and do not sleep with pets.  Day of Surgery  Do not apply any lotions/deodorants the morning of surgery.  Please wear clean clothes to the hospital/surgery center.  Please read over the following fact sheets that you were given.  Pain Booklet, Coughing and Deep Breathing, Blood Transfusion Information, MRSA Information and Surgical Site Infection Prevention

## 2015-10-24 NOTE — Progress Notes (Signed)
Pt. Aware of need to hold Plavix. Pt. Followed by P. Ross, for cardiac, pt. Will soon be establishing care with PCP at [facility] grp, C. Freda Jackson.

## 2015-10-30 MED ORDER — SODIUM CHLORIDE 0.9 % IV SOLN: INTRAVENOUS | Status: DC

## 2015-10-30 MED ORDER — VANCOMYCIN HCL IN DEXTROSE 1-5 GM/200ML-% IV SOLN
1000.0000 mg | INTRAVENOUS | Status: AC
  Administered 2015-10-31: 1000 mg via INTRAVENOUS
  Filled 2015-10-30: qty 200

## 2015-10-31 ENCOUNTER — Inpatient Hospital Stay (HOSPITAL_COMMUNITY): Payer: Medicare Other | Admitting: Anesthesiology

## 2015-10-31 ENCOUNTER — Inpatient Hospital Stay (HOSPITAL_COMMUNITY)
Admission: RE | Admit: 2015-10-31 | Discharge: 2015-11-01 | DRG: 038 | Disposition: A | Discharge status: Home / Self Care | Payer: Medicare Other | Source: Ambulatory Visit | Attending: Surgery | Admitting: Surgery

## 2015-10-31 ENCOUNTER — Encounter (HOSPITAL_COMMUNITY)
Admission: RE | Disposition: A | Discharge status: Home / Self Care | Payer: Self-pay | Source: Ambulatory Visit | Attending: Surgery

## 2015-10-31 ENCOUNTER — Encounter (HOSPITAL_COMMUNITY): Payer: Self-pay | Admitting: *Deleted

## 2015-10-31 DIAGNOSIS — D62 Acute posthemorrhagic anemia: Secondary | ICD-10-CM | POA: Diagnosis not present

## 2015-10-31 DIAGNOSIS — Z7902 Long term (current) use of antithrombotics/antiplatelets: Secondary | ICD-10-CM

## 2015-10-31 DIAGNOSIS — E785 Hyperlipidemia, unspecified: Secondary | ICD-10-CM | POA: Diagnosis present

## 2015-10-31 DIAGNOSIS — N189 Chronic kidney disease, unspecified: Secondary | ICD-10-CM | POA: Diagnosis present

## 2015-10-31 DIAGNOSIS — M1990 Unspecified osteoarthritis, unspecified site: Secondary | ICD-10-CM | POA: Diagnosis present

## 2015-10-31 DIAGNOSIS — Z7982 Long term (current) use of aspirin: Secondary | ICD-10-CM | POA: Diagnosis not present

## 2015-10-31 DIAGNOSIS — Z87891 Personal history of nicotine dependence: Secondary | ICD-10-CM | POA: Diagnosis not present

## 2015-10-31 DIAGNOSIS — I6523 Occlusion and stenosis of bilateral carotid arteries: Secondary | ICD-10-CM | POA: Diagnosis present

## 2015-10-31 DIAGNOSIS — I771 Stricture of artery: Secondary | ICD-10-CM | POA: Diagnosis present

## 2015-10-31 DIAGNOSIS — I739 Peripheral vascular disease, unspecified: Secondary | ICD-10-CM | POA: Diagnosis present

## 2015-10-31 DIAGNOSIS — F329 Major depressive disorder, single episode, unspecified: Secondary | ICD-10-CM | POA: Diagnosis present

## 2015-10-31 DIAGNOSIS — Z9849 Cataract extraction status, unspecified eye: Secondary | ICD-10-CM | POA: Diagnosis not present

## 2015-10-31 DIAGNOSIS — Q211 Atrial septal defect: Secondary | ICD-10-CM | POA: Diagnosis not present

## 2015-10-31 DIAGNOSIS — I6529 Occlusion and stenosis of unspecified carotid artery: Secondary | ICD-10-CM | POA: Diagnosis present

## 2015-10-31 DIAGNOSIS — I129 Hypertensive chronic kidney disease with stage 1 through stage 4 chronic kidney disease, or unspecified chronic kidney disease: Secondary | ICD-10-CM | POA: Diagnosis present

## 2015-10-31 DIAGNOSIS — Z79899 Other long term (current) drug therapy: Secondary | ICD-10-CM

## 2015-10-31 DIAGNOSIS — H3530 Unspecified macular degeneration: Secondary | ICD-10-CM | POA: Diagnosis present

## 2015-10-31 DIAGNOSIS — I2510 Atherosclerotic heart disease of native coronary artery without angina pectoris: Secondary | ICD-10-CM | POA: Diagnosis present

## 2015-10-31 DIAGNOSIS — I6521 Occlusion and stenosis of right carotid artery: Secondary | ICD-10-CM

## 2015-10-31 DIAGNOSIS — Z86718 Personal history of other venous thrombosis and embolism: Secondary | ICD-10-CM | POA: Diagnosis not present

## 2015-10-31 HISTORY — PX: ENDARTERECTOMY: SHX5162

## 2015-10-31 SURGERY — ENDARTERECTOMY, CAROTID
Anesthesia: General | Site: Neck | Laterality: Right

## 2015-10-31 MED ORDER — HEPARIN SODIUM (PORCINE) 1000 UNIT/ML IJ SOLN
INTRAMUSCULAR | Status: DC | PRN
  Administered 2015-10-31: 6000 [IU] via INTRAVENOUS
  Administered 2015-10-31: 1000 [IU] via INTRAVENOUS

## 2015-10-31 MED ORDER — TRAMADOL HCL 50 MG PO TABS: 50.0000 mg | ORAL_TABLET | Freq: Three times a day (TID) | ORAL | Status: DC | PRN

## 2015-10-31 MED ORDER — ACETAMINOPHEN 325 MG PO TABS: 325.0000 mg | ORAL_TABLET | ORAL | Status: DC | PRN

## 2015-10-31 MED ORDER — LACTATED RINGERS IV SOLN
INTRAVENOUS | Status: DC | PRN
  Administered 2015-10-31: 07:00:00 via INTRAVENOUS

## 2015-10-31 MED ORDER — HYDRALAZINE HCL 20 MG/ML IJ SOLN
5.0000 mg | INTRAMUSCULAR | Status: DC | PRN
  Administered 2015-10-31: 5 mg via INTRAVENOUS

## 2015-10-31 MED ORDER — 0.9 % SODIUM CHLORIDE (POUR BTL) OPTIME
TOPICAL | Status: DC | PRN
  Administered 2015-10-31: 2500 mL

## 2015-10-31 MED ORDER — FENTANYL CITRATE (PF) 250 MCG/5ML IJ SOLN
INTRAMUSCULAR | Status: AC
  Filled 2015-10-31: qty 5

## 2015-10-31 MED ORDER — FENTANYL CITRATE (PF) 100 MCG/2ML IJ SOLN
INTRAMUSCULAR | Status: DC | PRN
  Administered 2015-10-31: 75 ug via INTRAVENOUS

## 2015-10-31 MED ORDER — OXYCODONE HCL 5 MG PO TABS
ORAL_TABLET | ORAL | Status: AC
  Administered 2015-10-31: 5 mg via ORAL
  Filled 2015-10-31: qty 1

## 2015-10-31 MED ORDER — ATORVASTATIN CALCIUM 20 MG PO TABS
20.0000 mg | ORAL_TABLET | Freq: Every day | ORAL | Status: DC
  Administered 2015-10-31: 20 mg via ORAL
  Filled 2015-10-31: qty 1

## 2015-10-31 MED ORDER — CHLORHEXIDINE GLUCONATE CLOTH 2 % EX PADS: 6.0000 | MEDICATED_PAD | Freq: Once | CUTANEOUS | Status: DC

## 2015-10-31 MED ORDER — HYDRALAZINE HCL 20 MG/ML IJ SOLN
INTRAMUSCULAR | Status: AC
  Filled 2015-10-31: qty 1

## 2015-10-31 MED ORDER — OCUVITE-LUTEIN PO CAPS
1.0000 | ORAL_CAPSULE | Freq: Every day | ORAL | Status: DC
  Filled 2015-10-31: qty 1

## 2015-10-31 MED ORDER — TRAMADOL HCL 50 MG PO TABS
ORAL_TABLET | ORAL | Status: AC
  Filled 2015-10-31: qty 1

## 2015-10-31 MED ORDER — SODIUM CHLORIDE 0.9 % IV SOLN
INTRAVENOUS | Status: DC
  Administered 2015-10-31: 16:00:00 via INTRAVENOUS

## 2015-10-31 MED ORDER — MORPHINE SULFATE (PF) 2 MG/ML IV SOLN: 1.0000 mg | INTRAVENOUS | Status: DC | PRN

## 2015-10-31 MED ORDER — OXYCODONE HCL 5 MG PO TABS
5.0000 mg | ORAL_TABLET | Freq: Once | ORAL | Status: AC | PRN
  Administered 2015-10-31: 5 mg via ORAL

## 2015-10-31 MED ORDER — CARVEDILOL 3.125 MG PO TABS
3.1250 mg | ORAL_TABLET | Freq: Every day | ORAL | Status: DC
  Administered 2015-10-31: 3.125 mg via ORAL
  Filled 2015-10-31: qty 1

## 2015-10-31 MED ORDER — HEPARIN SODIUM (PORCINE) 1000 UNIT/ML IJ SOLN
INTRAMUSCULAR | Status: AC
  Filled 2015-10-31: qty 1

## 2015-10-31 MED ORDER — ONDANSETRON HCL 4 MG/2ML IJ SOLN
4.0000 mg | Freq: Four times a day (QID) | INTRAMUSCULAR | Status: DC | PRN
  Administered 2015-11-01: 4 mg via INTRAVENOUS
  Filled 2015-10-31: qty 2

## 2015-10-31 MED ORDER — DEXTROSE 5 % IV SOLN
10.0000 mg | INTRAVENOUS | Status: DC | PRN
  Administered 2015-10-31: 15 ug/min via INTRAVENOUS

## 2015-10-31 MED ORDER — PROTAMINE SULFATE 10 MG/ML IV SOLN
INTRAVENOUS | Status: DC | PRN
  Administered 2015-10-31: 10 mg via INTRAVENOUS
  Administered 2015-10-31: 20 mg via INTRAVENOUS
  Administered 2015-10-31 (×2): 10 mg via INTRAVENOUS

## 2015-10-31 MED ORDER — SODIUM CHLORIDE 0.9 % IV SOLN
0.0125 ug/kg/min | INTRAVENOUS | Status: AC
  Administered 2015-10-31: .2 ug/kg/min via INTRAVENOUS
  Filled 2015-10-31: qty 2000

## 2015-10-31 MED ORDER — FENTANYL CITRATE (PF) 100 MCG/2ML IJ SOLN
25.0000 ug | INTRAMUSCULAR | Status: DC | PRN
  Administered 2015-10-31 (×3): 25 ug via INTRAVENOUS
  Administered 2015-10-31: 50 ug via INTRAVENOUS
  Administered 2015-10-31: 25 ug via INTRAVENOUS

## 2015-10-31 MED ORDER — ASPIRIN EC 81 MG PO TBEC
81.0000 mg | DELAYED_RELEASE_TABLET | Freq: Every day | ORAL | Status: DC
  Administered 2015-11-01: 81 mg via ORAL
  Filled 2015-10-31 (×2): qty 1

## 2015-10-31 MED ORDER — SODIUM CHLORIDE 0.9 % IV SOLN
INTRAVENOUS | Status: DC | PRN
  Administered 2015-10-31: 500 mL

## 2015-10-31 MED ORDER — VANCOMYCIN HCL IN DEXTROSE 1-5 GM/200ML-% IV SOLN
1000.0000 mg | Freq: Once | INTRAVENOUS | Status: AC
  Administered 2015-11-01: 1000 mg via INTRAVENOUS
  Filled 2015-10-31: qty 200

## 2015-10-31 MED ORDER — MAGNESIUM HYDROXIDE 400 MG/5ML PO SUSP: 30.0000 mL | Freq: Every day | ORAL | Status: DC | PRN

## 2015-10-31 MED ORDER — VITAMIN D 1000 UNITS PO TABS
1000.0000 [IU] | ORAL_TABLET | Freq: Every day | ORAL | Status: DC
  Administered 2015-10-31 – 2015-11-01 (×2): 1000 [IU] via ORAL
  Filled 2015-10-31 (×3): qty 1

## 2015-10-31 MED ORDER — ROCURONIUM BROMIDE 100 MG/10ML IV SOLN
INTRAVENOUS | Status: DC | PRN
  Administered 2015-10-31: 40 mg via INTRAVENOUS

## 2015-10-31 MED ORDER — BISACODYL 10 MG RE SUPP: 10.0000 mg | Freq: Every day | RECTAL | Status: DC | PRN

## 2015-10-31 MED ORDER — SODIUM CHLORIDE 0.9 % IV SOLN: 500.0000 mL | Freq: Once | INTRAVENOUS | Status: DC | PRN

## 2015-10-31 MED ORDER — ONDANSETRON HCL 4 MG/2ML IJ SOLN
INTRAMUSCULAR | Status: DC | PRN
  Administered 2015-10-31: 4 mg via INTRAVENOUS

## 2015-10-31 MED ORDER — LISINOPRIL 40 MG PO TABS
40.0000 mg | ORAL_TABLET | Freq: Every day | ORAL | Status: DC
  Administered 2015-10-31 – 2015-11-01 (×2): 40 mg via ORAL
  Filled 2015-10-31 (×2): qty 1

## 2015-10-31 MED ORDER — OXYCODONE HCL 5 MG/5ML PO SOLN: 5.0000 mg | Freq: Once | ORAL | Status: AC | PRN

## 2015-10-31 MED ORDER — FENTANYL CITRATE (PF) 100 MCG/2ML IJ SOLN
INTRAMUSCULAR | Status: AC
  Administered 2015-10-31: 25 ug via INTRAVENOUS
  Filled 2015-10-31: qty 2

## 2015-10-31 MED ORDER — NEOSTIGMINE METHYLSULFATE 10 MG/10ML IV SOLN
INTRAVENOUS | Status: DC | PRN
  Administered 2015-10-31: 3 mg via INTRAVENOUS

## 2015-10-31 MED ORDER — PHENOL 1.4 % MT LIQD
1.0000 | OROMUCOSAL | Status: DC | PRN
Start: 2015-10-31 — End: 2015-11-01

## 2015-10-31 MED ORDER — DIPHENHYDRAMINE HCL 50 MG/ML IJ SOLN
INTRAMUSCULAR | Status: DC | PRN
  Administered 2015-10-31: 25 mg via INTRAVENOUS

## 2015-10-31 MED ORDER — CLOPIDOGREL BISULFATE 75 MG PO TABS
75.0000 mg | ORAL_TABLET | Freq: Every day | ORAL | Status: DC
  Administered 2015-11-01: 75 mg via ORAL
  Filled 2015-10-31: qty 1

## 2015-10-31 MED ORDER — VANCOMYCIN HCL IN DEXTROSE 1-5 GM/200ML-% IV SOLN
1000.0000 mg | Freq: Two times a day (BID) | INTRAVENOUS | Status: DC
  Filled 2015-10-31 (×2): qty 200

## 2015-10-31 MED ORDER — PRESERVISION AREDS PO CAPS: 1.0000 | ORAL_CAPSULE | Freq: Every day | ORAL | Status: DC

## 2015-10-31 MED ORDER — AMLODIPINE BESYLATE 5 MG PO TABS
5.0000 mg | ORAL_TABLET | Freq: Two times a day (BID) | ORAL | Status: DC
  Administered 2015-10-31 – 2015-11-01 (×2): 5 mg via ORAL
  Filled 2015-10-31 (×2): qty 1

## 2015-10-31 MED ORDER — PHENYLEPHRINE HCL 10 MG/ML IJ SOLN
INTRAMUSCULAR | Status: DC | PRN
  Administered 2015-10-31: 80 ug via INTRAVENOUS
  Administered 2015-10-31 (×4): 40 ug via INTRAVENOUS
  Administered 2015-10-31: 80 ug via INTRAVENOUS

## 2015-10-31 MED ORDER — GLYCOPYRROLATE 0.2 MG/ML IJ SOLN
INTRAMUSCULAR | Status: DC | PRN
  Administered 2015-10-31: 0.2 mg via INTRAVENOUS
  Administered 2015-10-31: .4 mg via INTRAVENOUS

## 2015-10-31 MED ORDER — LABETALOL HCL 5 MG/ML IV SOLN: 10.0000 mg | INTRAVENOUS | Status: DC | PRN

## 2015-10-31 MED ORDER — PROPOFOL 10 MG/ML IV BOLUS
INTRAVENOUS | Status: DC | PRN
  Administered 2015-10-31: 10 mg via INTRAVENOUS
  Administered 2015-10-31: 20 mg via INTRAVENOUS
  Administered 2015-10-31: 40 mg via INTRAVENOUS

## 2015-10-31 MED ORDER — METOPROLOL TARTRATE 1 MG/ML IV SOLN: 2.0000 mg | INTRAVENOUS | Status: DC | PRN

## 2015-10-31 MED ORDER — GUAIFENESIN-DM 100-10 MG/5ML PO SYRP: 15.0000 mL | ORAL_SOLUTION | ORAL | Status: DC | PRN

## 2015-10-31 MED ORDER — TRAMADOL HCL 50 MG PO TABS
50.0000 mg | ORAL_TABLET | Freq: Four times a day (QID) | ORAL | Status: DC | PRN
  Administered 2015-10-31 (×2): 50 mg via ORAL
  Filled 2015-10-31: qty 1

## 2015-10-31 MED ORDER — FENTANYL CITRATE (PF) 100 MCG/2ML IJ SOLN
INTRAMUSCULAR | Status: AC
  Filled 2015-10-31: qty 2

## 2015-10-31 MED ORDER — HEMOSTATIC AGENTS (NO CHARGE) OPTIME
TOPICAL | Status: DC | PRN
  Administered 2015-10-31 (×2): 1 via TOPICAL

## 2015-10-31 MED ORDER — FERROUS SULFATE 325 (65 FE) MG PO TABS
325.0000 mg | ORAL_TABLET | Freq: Every day | ORAL | Status: DC
  Administered 2015-10-31 – 2015-11-01 (×2): 325 mg via ORAL
  Filled 2015-10-31 (×2): qty 1

## 2015-10-31 MED ORDER — DIPHENHYDRAMINE HCL 50 MG/ML IJ SOLN
INTRAMUSCULAR | Status: AC
  Filled 2015-10-31: qty 1

## 2015-10-31 MED ORDER — PROPOFOL 10 MG/ML IV BOLUS
INTRAVENOUS | Status: AC
  Filled 2015-10-31: qty 20

## 2015-10-31 MED ORDER — ARTIFICIAL TEARS OP OINT
TOPICAL_OINTMENT | OPHTHALMIC | Status: DC | PRN
  Administered 2015-10-31: 1 via OPHTHALMIC

## 2015-10-31 MED ORDER — ACETAMINOPHEN 650 MG RE SUPP: 325.0000 mg | RECTAL | Status: DC | PRN

## 2015-10-31 MED ORDER — LIDOCAINE HCL (PF) 1 % IJ SOLN
INTRAMUSCULAR | Status: AC
  Filled 2015-10-31: qty 30

## 2015-10-31 MED ORDER — PANTOPRAZOLE SODIUM 40 MG PO TBEC
40.0000 mg | DELAYED_RELEASE_TABLET | Freq: Every day | ORAL | Status: DC
  Administered 2015-10-31 – 2015-11-01 (×2): 40 mg via ORAL
  Filled 2015-10-31 (×2): qty 1

## 2015-10-31 MED ORDER — HYDRALAZINE HCL 50 MG PO TABS
50.0000 mg | ORAL_TABLET | Freq: Three times a day (TID) | ORAL | Status: DC
  Administered 2015-10-31 – 2015-11-01 (×4): 50 mg via ORAL
  Filled 2015-10-31 (×4): qty 1

## 2015-10-31 MED ORDER — ALUM & MAG HYDROXIDE-SIMETH 200-200-20 MG/5ML PO SUSP: 15.0000 mL | ORAL | Status: DC | PRN

## 2015-10-31 MED ORDER — POTASSIUM CHLORIDE CRYS ER 20 MEQ PO TBCR: 20.0000 meq | EXTENDED_RELEASE_TABLET | Freq: Every day | ORAL | Status: DC | PRN

## 2015-10-31 MED ORDER — DOCUSATE SODIUM 100 MG PO CAPS
100.0000 mg | ORAL_CAPSULE | Freq: Every day | ORAL | Status: DC
  Administered 2015-11-01: 100 mg via ORAL
  Filled 2015-10-31: qty 1

## 2015-10-31 MED ORDER — ACETAMINOPHEN 160 MG/5ML PO SOLN
325.0000 mg | ORAL | Status: DC | PRN
  Filled 2015-10-31: qty 20.3

## 2015-10-31 MED ORDER — LIDOCAINE HCL (CARDIAC) 20 MG/ML IV SOLN
INTRAVENOUS | Status: DC | PRN
  Administered 2015-10-31: 40 mg via INTRAVENOUS

## 2015-10-31 SURGICAL SUPPLY — 52 items
CANISTER SUCTION 2500CC (MISCELLANEOUS) ×3 IMPLANT
CATH ROBINSON RED A/P 18FR (CATHETERS) ×3 IMPLANT
CATH SUCT 10FR WHISTLE TIP (CATHETERS) ×3 IMPLANT
CLIP TI MEDIUM 6 (CLIP) ×3 IMPLANT
CLIP TI WIDE RED SMALL 6 (CLIP) ×3 IMPLANT
CRADLE DONUT ADULT HEAD (MISCELLANEOUS) ×3 IMPLANT
DRAIN CHANNEL 15F RND FF W/TCR (WOUND CARE) IMPLANT
ELECT REM PT RETURN 9FT ADLT (ELECTROSURGICAL) ×3
ELECTRODE REM PT RTRN 9FT ADLT (ELECTROSURGICAL) ×1 IMPLANT
EVACUATOR SILICONE 100CC (DRAIN) IMPLANT
GAUZE SPONGE 4X4 12PLY STRL (GAUZE/BANDAGES/DRESSINGS) ×3 IMPLANT
GLOVE BIO SURGEON STRL SZ 6.5 (GLOVE) ×2 IMPLANT
GLOVE BIO SURGEONS STRL SZ 6.5 (GLOVE) ×2
GLOVE BIOGEL PI IND STRL 6.5 (GLOVE) IMPLANT
GLOVE BIOGEL PI IND STRL 7.0 (GLOVE) IMPLANT
GLOVE BIOGEL PI IND STRL 7.5 (GLOVE) ×1 IMPLANT
GLOVE BIOGEL PI INDICATOR 6.5 (GLOVE) ×2
GLOVE BIOGEL PI INDICATOR 7.0 (GLOVE) ×2
GLOVE BIOGEL PI INDICATOR 7.5 (GLOVE) ×2
GLOVE SURG SS PI 7.0 STRL IVOR (GLOVE) ×2 IMPLANT
GLOVE SURG SS PI 7.5 STRL IVOR (GLOVE) ×3 IMPLANT
GOWN STRL REUS W/ TWL LRG LVL3 (GOWN DISPOSABLE) ×2 IMPLANT
GOWN STRL REUS W/ TWL XL LVL3 (GOWN DISPOSABLE) ×1 IMPLANT
GOWN STRL REUS W/TWL LRG LVL3 (GOWN DISPOSABLE) ×6
GOWN STRL REUS W/TWL XL LVL3 (GOWN DISPOSABLE) ×3
HEMOSTAT SNOW SURGICEL 2X4 (HEMOSTASIS) ×4 IMPLANT
INSERT FOGARTY SM (MISCELLANEOUS) IMPLANT
KIT BASIN OR (CUSTOM PROCEDURE TRAY) ×3 IMPLANT
KIT ROOM TURNOVER OR (KITS) ×3 IMPLANT
LIQUID BAND (GAUZE/BANDAGES/DRESSINGS) ×3 IMPLANT
NDL HYPO 25GX1X1/2 BEV (NEEDLE) IMPLANT
NEEDLE HYPO 25GX1X1/2 BEV (NEEDLE) IMPLANT
NS IRRIG 1000ML POUR BTL (IV SOLUTION) ×9 IMPLANT
PACK CAROTID (CUSTOM PROCEDURE TRAY) ×3 IMPLANT
PAD ARMBOARD 7.5X6 YLW CONV (MISCELLANEOUS) ×6 IMPLANT
PATCH VASC XENOSURE 1CMX6CM (Vascular Products) ×3 IMPLANT
PATCH VASC XENOSURE 1X6 (Vascular Products) IMPLANT
SHUNT CAROTID BYPASS 10 (VASCULAR PRODUCTS) IMPLANT
SHUNT CAROTID BYPASS 12FRX15.5 (VASCULAR PRODUCTS) IMPLANT
SPONGE INTESTINAL PEANUT (DISPOSABLE) ×3 IMPLANT
SUT ETHILON 3 0 PS 1 (SUTURE) IMPLANT
SUT PROLENE 5 0 C 1 24 (SUTURE) ×2 IMPLANT
SUT PROLENE 6 0 BV (SUTURE) ×9 IMPLANT
SUT PROLENE 7 0 BV 1 (SUTURE) IMPLANT
SUT PROLENE 7 0 BV1 MDA (SUTURE) ×2 IMPLANT
SUT SILK 3 0 TIES 17X18 (SUTURE)
SUT SILK 3-0 18XBRD TIE BLK (SUTURE) IMPLANT
SUT VIC AB 3-0 SH 27 (SUTURE) ×6
SUT VIC AB 3-0 SH 27X BRD (SUTURE) ×2 IMPLANT
SUT VICRYL 4-0 PS2 18IN ABS (SUTURE) ×3 IMPLANT
SYR CONTROL 10ML LL (SYRINGE) IMPLANT
WATER STERILE IRR 1000ML POUR (IV SOLUTION) ×3 IMPLANT

## 2015-10-31 NOTE — Transfer of Care (Signed)
Immediate Anesthesia Transfer of Care Note  Patient: [name]  Procedure(s) Performed: Procedure(s): RIGHT CAROTID ENDARTERECTOMY (Right)  Patient Location: PACU  Anesthesia Type:General  Level of Consciousness: awake, alert  and oriented  Airway & Oxygen Therapy: Patient Spontanous Breathing and Patient connected to nasal cannula oxygen  Post-op Assessment: Report given to RN and Post -op Vital signs reviewed and stable  Post vital signs: Reviewed and stable  Last Vitals:  Filed Vitals:   10/31/15 0555 10/31/15 0602  BP: 148/52   Pulse: 77   Temp:  36.6 C  Resp: 18     Complications: No apparent anesthesia complications

## 2015-10-31 NOTE — Progress Notes (Signed)
Consulting civil engineerCharge RN called 3 south. [place] given to [place], [place]. Pt. Transported by RN & tech. Family has been updated.

## 2015-10-31 NOTE — Op Note (Signed)
Patient name: [name] MRN: [id number] DOB: [birth year] Sex: female  10/31/2015 Pre-operative Diagnosis: asymptomatic   right carotid stenosis Post-operative diagnosis:  Same Surgeon:  Durene Cal Assistants:  S. Rhyne Procedure:    right carotid Endarterectomy with bovine pericardial  patch angioplasty Anesthesia:  General Blood Loss:  See anesthesia record Specimens:  Carotid Plaque to pathology  Findings:  90 %stenosis; Thrombus:  none  Indications:  I have been following the patient for right carotid stenosis and innominate artery stenosis.  She has recently seen a progressive increase in the stenosis in her right carotid artery as documented by ultrasound as well as CT scan.  We have decided to proceed with carotid endarterectomy.  Procedure:  The patient was identified in the holding area and taken to [facility] OR ROOM 11  The patient was then placed supine on the table.   General endotrachial anesthesia was administered.  The patient was prepped and draped in the usual sterile fashion.  A time out was called and antibiotics were administered.  The incision was made along the anterior border of the right sternocleidomastoid muscle.  Cautery was used to dissect through the subcutaneous tissue.  The platysma muscle was divided with cautery.  The internal jugular vein was exposed along its anterior medial border.  The common facial vein was exposed and then divided between 2-0 silk ties and metal clips.  The common carotid artery was then circumferentially exposed and encircled with an umbilical tape.  The vagus nerve was identified and protected.  Next sharp dissection was used to expose the external carotid artery and the superior thyroid artery.  The were encircled with a blue vessel loop and a 2-0 silk tie respectively.  Finally, the internal carotid was carefully dissected free.  An umbilical tape was placed around the internal carotid artery distal to the diseased segment.  The  hypoglossal nerve was visualized throughout and protected.  The patient was given systemic heparinization.  A bovine carotid patch was selected and prepared on the back table.  A 10 french shunt was also prepared.  After blood pressure readings were appropriate and the heparin had been given time to circulate, the internal carotid artery was occluded with a baby Gregory clamp.  The external and common carotid arteries were then occluded with vascular clamps and the 2-0 tie tightened on the superior thyroid artery.  A #11 blade was used to make an arteriotomy in the common carotid artery.  This was extended with Potts scissors along the anterior and lateral border of the common and internal carotid artery.  Approximately 90% stenosis was identified.  There was no thrombus identified.  The 10 french shunt was not placed because there was good backbleeding, and I did not want to disturb the plaque within the innominate artery..  A kleiner kuntz elevator was used to perform endarterectomy.  An eversion endarterectomy was performed in the external carotid artery.  A good distal endpoint was obtained in the internal carotid artery.  The specimen was removed and sent to pathology.  Heparinized saline was used to irrigate the endarterectomized field.  All potential embolic debris was removed.  Bovine pericardial patch angioplasty was then performed using a running 6-0 Prolene. The common internal and external carotid arteries were all appropriately flushed. The artery was again irrigated with heparin saline.  The anastomosis was then secured. The clamp was first released on the external carotid artery followed by the common carotid artery approximately 30 seconds later, bloodflow was  reestablish through the internal carotid artery.  Next, a hand-held  Doppler was used to evaluate the signals in the common, external, and internal  carotid arteries, all of which had appropriate signals. I then administered  50 mg  protamine. The wound was then irrigated.  After hemostasis was achieved, the carotid sheath was reapproximated with 3-0 Vicryl. The  platysma muscle was reapproximated with running 3-0 Vicryl. The skin  was closed with 4-0 Vicryl. Dermabond was placed on the skin. The  patient was then successfully extubated. Her neurologic exam was  similar to his preprocedural exam. The patient was then taken to recovery room  in stable condition. There were no complications.     Disposition:  To PACU in stable condition.  Relevant Operative Details:  Normal anatomy.  90% calcified stenosis.  I did not use a shunt because I did not want to disturb the plaque within the innominate artery, and the patient had adequate backbleeding.  I performed endarterectomy about 4 cm into the common carotid artery.  I closed the common carotid artery primarily with running 5-0 Prolene and then used a bovine pericardial patch on the internal carotid artery and distal common carotid artery.   [name] [name], M.D. [facility] [place] Office: [phone] Pager:  [phone]

## 2015-10-31 NOTE — Progress Notes (Signed)
Pharmacy: Rx to adjust antibiotics for renal function  OBJECTIVE:  SCr 1.27 on 3/2 Estimated CrCl~30-35 ml/min Wt: 58.1 kg  ASSESSMENT:  [age] YOF admitted 3/9 for a planned R-carotid endarterectomy. Pharmacy consulted to adjust antibiotics for renal function. Post-op Vancomycin dosing needs adjustment for renal function.  PLAN:  1. Adjust Vancomycin to 1g IV every 24 hours 2. Pharmacy will monitor for any other needed adjustments  Georgina PillionElizabeth Ader Fritze, PharmD, BCPS Clinical Pharmacist Pager: [phone] 10/31/2015 3:57 PM

## 2015-10-31 NOTE — Anesthesia Preprocedure Evaluation (Signed)
Anesthesia Evaluation  Patient identified by MRN, date of birth, ID band Patient awake    Reviewed: Allergy & Precautions, NPO status , Patient's Chart, lab work & pertinent test results, reviewed documented beta blocker date and time   History of Anesthesia Complications Negative for: history of anesthetic complications  Airway Mallampati: I  TM Distance: >3 FB Neck ROM: Full    Dental  (+) Upper Dentures, Lower Dentures   Pulmonary neg shortness of breath, neg sleep apnea, neg COPD, neg recent URI, former smoker,    breath sounds clear to auscultation       Cardiovascular hypertension, Pt. on medications and Pt. on home beta blockers (-) angina+ CAD and + Peripheral Vascular Disease  (-) Past MI and (-) CHF  Rhythm:Regular + Systolic murmurs    Neuro/Psych PSYCHIATRIC DISORDERS Depression  Neuromuscular disease    GI/Hepatic   Endo/Other  negative endocrine ROS  Renal/GU Renal InsufficiencyRenal disease     Musculoskeletal  (+) Arthritis ,   Abdominal   Peds  Hematology  (+) anemia ,   Anesthesia Other Findings   Reproductive/Obstetrics                             Anesthesia Physical Anesthesia Plan  ASA: III  Anesthesia Plan: General   Post-op Pain Management:    Induction: Intravenous  Airway Management Planned: Oral ETT  Additional Equipment: Arterial line  Intra-op Plan:   Post-operative Plan: Extubation in OR  Informed Consent: I have reviewed the patients History and Physical, chart, labs and discussed the procedure including the risks, benefits and alternatives for the proposed anesthesia with the patient or authorized representative who has indicated his/her understanding and acceptance.   Dental advisory given  Plan Discussed with: CRNA and Surgeon  Anesthesia Plan Comments:         Anesthesia Quick Evaluation

## 2015-10-31 NOTE — Interval H&P Note (Signed)
History and Physical Interval Note:  10/31/2015 7:26 AM  Jessica MoralesNancy E Luevano  has presented today for surgery, with the diagnosis of Right carotid artery stenosis I65.21  The various methods of treatment have been discussed with the patient and family. After consideration of risks, benefits and other options for treatment, the patient has consented to  Procedure(s): ENDARTERECTOMY CAROTID (Right) as a surgical intervention .  The patient's history has been reviewed, patient examined, no change in status, stable for surgery.  I have reviewed the patient's chart and labs.  Questions were answered to the patient's satisfaction.     Durene CalBrabham, [place]

## 2015-10-31 NOTE — H&P (View-Only) (Signed)
Patient name: Jessica Moralesancy E Rials MRN: [id number] DOB: 03-20-1931 Sex: female     Chief Complaint  Patient presents with  . Carotid    f/u    HISTORY OF PRESENT ILLNESS: [name] is a 82 y.o. female who here for follow-up of her bilateral carotid artery stenosis and known innominate stenosis. Her most recent duplex revealed right ICA stenosis of 70% and 50-69% left ICA stenosis. The patient denies any TIA or stroke symptoms. She denies any sudden weakness or numbness of her extremities. She has macular degeneration but denies any amaurosis fugax. She denies receptive or expressive aphasia. She is back today to discuss her CT scan findings.  There have been no interval changes.  Past Medical History  Diagnosis Date  . Hypertension     a. Intolerant to Maxzide due to hyponatremia. b. intolerant to Clonidine due to slow HR/fatigue. c. Discrepancy in BP felt due to innominant stenosis.  . Hyperlipidemia   . Carotid artery occlusion     RICA 80-99%, LICA 40-59% by dopplers 06/2013. Also with cerebrovascular disease otherwise on MRA 06/2013.  . Macular degeneration     In both eyes - no longer drives.  . Arthritis   . Hiatal hernia   . Chronic kidney disease   . DVT (deep venous thrombosis) (HCC)     a. Around age 82, details unclear (SVT vs DVT?), s/p vein stripping without recurrence.  . Anemia     a. "On/off my whole life" - etiology unclear. Has never required blood transfusion.  . Multiple thyroid nodules     a. By US 06/2013. Bx recommended.  Marland Kitchen. PFO (patent foramen ovale)     a. Per echo in 12/2010.  Marland Kitchen. PVD (peripheral vascular disease) (HCC)     a. R innominant stenosis. b. 06/2013 ABI: 0.80 R, 0.50 L.  . Dizziness     a. Adm 06/2013: felt due to posterior circulation deficits from vertebrobasilar insufficiency and possibly vertigo.  Marland Kitchen. Heart murmur     a. Longstanding. mild MR on prior ECG, also may be in setting of known vasc dz.  . Carotid artery stenosis   . Varicose  veins   . CAD (coronary artery disease)     Past Surgical History  Procedure Laterality Date  . Cataract extraction    . Lumbar disc surgery    . Incontinence surgery    . Abdominal hysterectomy  1971  . Neck muscle surgery    . Varicose vein surgery      left leg  . Eye surgery    . Bladder surgery  2004    baldder sling  . Spine surgery      Social History   Social History  . Marital Status: Widowed    Spouse Name: N/A  . Number of Children: 6  . Years of Education: 8th   Occupational History  . retired    Social History Main Topics  . Smoking status: Former Smoker    Quit date: 08/24/2009  . Smokeless tobacco: Never Used  . Alcohol Use: No  . Drug Use: No  . Sexual Activity: No   Other Topics Concern  . Not on file   Social History Narrative    Family History  Problem Relation Age of Onset  . Cancer Mother     Raj JanusUterian  . Diabetes Sister   . Cancer Sister   . Hyperlipidemia Sister   . Heart attack Sister   . Hypertension Sister   .  Diabetes Son   . Hyperlipidemia Daughter   . Hypertension Daughter   . Cancer Sister     Breast  . Hyperlipidemia Sister     Allergies as of 10/04/2015 - Review Complete 10/04/2015  Allergen Reaction Noted  . Penicillins Rash 08/02/2009  . Clonidine derivatives  07/14/2013  . Maxzide [hydrochlorothiazide w-triamterene]  07/14/2013    Current Outpatient Prescriptions on File Prior to Visit  Medication Sig Dispense Refill  . amLODipine (NORVASC) 5 MG tablet TAKE ONE TABLET BY MOUTH TWICE DAILY 180 tablet 3  . aspirin 81 MG tablet Take 81 mg by mouth daily.      . atorvastatin (LIPITOR) 20 MG tablet Take 1 tablet (20 mg total) by mouth daily. 90 tablet 3  . Calcium Citrate (CALCITRATE PO) Take 1 tablet by mouth daily.     . carvedilol (COREG) 3.125 MG tablet Take 1 tablet (3.125 mg total) by mouth daily. 90 tablet 2  . cholecalciferol (VITAMIN D) 1000 UNITS tablet Take 1,000 Units by mouth daily.    . clopidogrel  (PLAVIX) 75 MG tablet TAKE ONE TABLET BY MOUTH DAILY WITH BREAKFAST 90 tablet 1  . ferrous sulfate 325 (65 FE) MG tablet TAKE ONE TABLET BY MOUTH ONE TIME DAILY 30 tablet 7  . Ferrous Sulfate Dried (FEOSOL) 200 (65 FE) MG TABS Take 1 tablet by mouth daily. 30 tablet 11  . hydrALAZINE (APRESOLINE) 25 MG tablet Take 2 tablets (50 mg total) by mouth 3 (three) times daily. 180 tablet 6  . ibuprofen (ADVIL,MOTRIN) 200 MG tablet Take 200 mg by mouth every 6 (six) hours as needed for mild pain.     . lisinopril (PRINIVIL,ZESTRIL) 40 MG tablet TAKE ONE TABLET BY MOUTH DAILY 90 tablet 1  . Multiple Vitamins-Minerals (PRESERVISION AREDS) CAPS Take 1 capsule by mouth daily.      No current facility-administered medications on file prior to visit.     REVIEW OF SYSTEMS: [X] denotes positive finding, [ ] denotes negative finding Cardiac  Comments:  Chest pain or chest pressure:    Shortness of breath upon exertion:    Short of breath when lying flat:    Irregular heart rhythm:        Vascular    Pain in calf, thigh, or hip brought on by ambulation: x   Pain in feet at night that wakes you up from your sleep:     Blood clot in your veins:    Leg swelling:  x       Pulmonary    Oxygen at home:    Productive cough:     Wheezing:         Neurologic    Sudden weakness in arms or legs:     Sudden numbness in arms or legs:     Sudden onset of difficulty speaking or slurred speech:    Temporary loss of vision in one eye:     Problems with dizziness:  x       Gastrointestinal    Blood in stool:     Vomited blood:         Genitourinary    Burning when urinating:     Blood in urine:        Psychiatric    Major depression:         Hematologic    Bleeding problems:    Problems with blood clotting too easily:        Skin    Rashes or ulcers:          Constitutional     Fever or chills:             PHYSICAL EXAMINATION:   Vital signs are  Filed Vitals:   10/04/15 1412 10/04/15 1418  BP: 161/65 128/59  Pulse: 62 62  Temp: 96.9 F (36.1 C)   Resp: 16   Height:  (1.702 m)   Weight: 131 lb (59.421 kg)   SpO2: 99%    Body mass index is 20.51 kg/(m^2). General: The patient appears their stated age. HEENT:  No gross abnormalities Pulmonary:  Non labored breathing Musculoskeletal: There are no major deformities. Neurologic: No focal weakness or paresthesias are detected, Skin: There are no ulcer or rashes noted. Psychiatric: The patient has normal affect. Cardiovascular: There is a regular rate and rhythm without significant murmur appreciated.   Diagnostic Studies  I have reviewed her CT scan with the following findings Very severe heavily calcified plaque affecting the aortic arch, proximal brachiocephalic vessels and vessels of the neck. In comparing to previous studies, the appearance is similar, with the exception that there is further decrease in size of the right cervical ICA compared to the previous study secondary to inflow reduction.  50% stenosis of the arch itself because of exuberant calcified plaque.  Very severe stenoses of the proximal innominate artery, left common carotid artery and left subclavian artery. Difficult to measure precisely because of the exuberant nature of the plaque. I suspect that the stenoses are 75% or greater in all these regions.  Extensive calcified plaque at the carotid bifurcation on the right. Very severe stenosis of the proximal ICA with luminal diameter probably 1 mm or less. Flow reduction results in a very small size of the right cervical internal carotid artery, reduced since previous study. Antegrade flow does persist however.  Extensive calcified plaque at the left carotid bifurcation. 80% stenosis of the proximal ICA on the left with a normal caliber of the more  distal cervical ICA on the left.  Severe stenoses at both vertebral artery origins. Dominant right vertebral artery shows 70% stenosis at the origin and is widely patent beyond that to the basilar. 50% stenosis of the proximal basilar artery. Non dominant left vertebral artery shows 70% stenosis at its origin with serial stenoses beyond that including a severe stenosis at the C4 level, 80% or greater. This vessel does continue to show antegrade flow however.  Assessment:  asymptomatic right carotid stenosis Plan:  after reviewing her CT scan and ultrasound, she has had progression of the stenosis on the right extracranial carotid artery. Because of the progression, I have recommended proceeding with carotid endarterectomy. We discussed that I do not think she is a good candidate for treatment of her innominate stenosis at this time.  Rather focus on a extracranial carotid endarterectomy. Velna Hatchet to discuss this further with her children but will most likely call to schedule this in the immediate future. We discussed the risks and benefits of the operation including the risk of stroke and nerve injury.  I discussed the details of the procedure. She will need to be off Plavix for 5 days.  Jorge Ny, M.D. [facility] Office: [phone] Pager:  [phone]

## 2015-10-31 NOTE — Progress Notes (Signed)
Called [address] to give report. RN unavailable @ this time. Will call back

## 2015-10-31 NOTE — Progress Notes (Signed)
  Day of Surgery Note    Subjective:  C/o pain at corner of lip  Filed Vitals:   10/31/15 1130 10/31/15 1145  BP:    Pulse: 61 62  Temp:    Resp: 14 13    Incisions:   Clean and dry without hematoma Extremities:  Moving all extremities equally Cardiac:  regular Lungs:  Non labored neuro:  In tact; tongue is midline; slight draw of the corner of the right side of the mouth  Assessment/Plan:  This is a 82 y.o. female who is s/p right carotid endarterectomy  -pt neurologically in tact. Hypertensive in the pacu with systolic 170's-will give IV hydralazine and restart home meds -A-line is reading 40-50 points higher than the cuff-will go by A-line -to 3 south when bed available.   Doreatha MassedSamantha Liliyana Thobe, PA-C 10/31/2015 12:23 PM

## 2015-10-31 NOTE — Anesthesia Procedure Notes (Signed)
Procedure Name: Intubation Date/Time: 10/31/2015 7:50 AM Performed by: Marena ChancyBECKNER, Suhaylah Wampole S Pre-anesthesia Checklist: Emergency Drugs available, Timeout performed, Patient identified, Patient being monitored and Suction available Patient Re-evaluated:Patient Re-evaluated prior to inductionOxygen Delivery Method: Circle system utilized Preoxygenation: Pre-oxygenation with 100% oxygen Intubation Type: IV induction Ventilation: Mask ventilation without difficulty Laryngoscope Size: Miller and 2 Grade View: Grade I Tube type: Oral Tube size: 7.0 mm Number of attempts: 1 Placement Confirmation: ETT inserted through vocal cords under direct vision,  breath sounds checked- equal and bilateral and positive ETCO2 Tube secured with: Tape Dental Injury: Teeth and Oropharynx as per pre-operative assessment

## 2015-11-01 ENCOUNTER — Encounter (HOSPITAL_COMMUNITY): Payer: Self-pay | Admitting: Surgery

## 2015-11-01 LAB — BASIC METABOLIC PANEL
ANION GAP: 6 (ref 5–15)
BUN: 15 mg/dL (ref 6–20)
CALCIUM: 8 mg/dL — AB (ref 8.9–10.3)
CO2: 19 mmol/L — AB (ref 22–32)
CREATININE: 1.2 mg/dL — AB (ref 0.44–1.00)
Chloride: 109 mmol/L (ref 101–111)
GFR calc Af Amer: 47 mL/min — ABNORMAL LOW (ref >60)
GFR, EST NON AFRICAN AMERICAN: 41 mL/min — AB (ref >60)
GLUCOSE: 86 mg/dL (ref 65–99)
Potassium: 3.9 mmol/L (ref 3.5–5.1)
Sodium: 134 mmol/L — ABNORMAL LOW (ref 135–145)

## 2015-11-01 LAB — CBC
HEMATOCRIT: 23.1 % — AB (ref 36.0–46.0)
HEMOGLOBIN: 7.5 g/dL — AB (ref 12.0–15.0)
MCH: 29.9 pg (ref 26.0–34.0)
MCHC: 32.5 g/dL (ref 30.0–36.0)
MCV: 92 fL (ref 78.0–100.0)
PLATELETS: 216 10*3/uL (ref 150–400)
RBC: 2.51 MIL/uL — AB (ref 3.87–5.11)
RDW: 13.8 % (ref 11.5–15.5)
WBC: 10.1 10*3/uL (ref 4.0–10.5)

## 2015-11-01 MED ORDER — PROSIGHT PO TABS
1.0000 | ORAL_TABLET | Freq: Every day | ORAL | Status: DC
  Administered 2015-11-01: 1 via ORAL
  Filled 2015-11-01: qty 1

## 2015-11-01 NOTE — Anesthesia Postprocedure Evaluation (Signed)
Anesthesia Post Note  Patient: [name]  Procedure(s) Performed: Procedure(s) (LRB): RIGHT CAROTID ENDARTERECTOMY (Right)  Patient location during evaluation: PACU Anesthesia Type: General Level of consciousness: awake Pain management: pain level controlled Vital Signs Assessment: post-procedure vital signs reviewed and stable Respiratory status: spontaneous breathing Cardiovascular status: stable Postop Assessment: no signs of nausea or vomiting Anesthetic complications: no    Last Vitals:  Filed Vitals:   11/01/15 0740 11/01/15 0805  BP:  117/56  Pulse:  70  Temp: 37.1 C   Resp:  17    Last Pain:  Filed Vitals:   11/01/15 1228  PainSc: 0-No pain                 Breawna Montenegro

## 2015-11-01 NOTE — Progress Notes (Signed)
DC instructions given to patient. All belongings sent home with patient. VSS. Daughter at bedside during DC education. Educated importance of S/S of CVA and infection, lift and driving restrictions. R neck incision CDI, no edema noted. PIV DC, hemostasis achieved. eICU and CCMT notified of DC. Pt escorted by RN via wheelchair to private vehicle driven by daughter.

## 2015-11-01 NOTE — Care Management Note (Signed)
Case Management Note  Patient Details  Name: [name] MRN: [id number] Date of Birth: 08/26/1930  Subjective/Objective:    Date: 11/01/15 Spoke with patient at the bedside along with daughter.  Introduced self as Sports coachcase manager and explained role in discharge planning and how to be reached.  Verified patient lives in town, with daughter [name]. Has a cane and a rolling walker. Expressed potential need for no  DME.  Verified patient anticipates to go home with family, at time of discharge and will have full-time supervision by family at this time to best of their knowledge. Patient denied needing help with their medication.  Patient is driven by daughter to MD appointments.  Verified patient has PCP Gildardo Crankerharles Ross.   Plan: CM will continue to follow [facility] planning [facility].                 Action/Plan:   Expected Discharge Date:                  Expected Discharge Plan:  Home/Self Care  In-House Referral:     Discharge planning Services  CM Consult  Post Acute Care Choice:    Choice offered to:     DME Arranged:    DME Agency:     HH Arranged:    HH Agency:     Status of Service:  Completed, signed off  Medicare Important Message Given:    Date Medicare IM Given:    Medicare IM give by:    Date Additional Medicare IM Given:    Additional Medicare Important Message give by:     If discussed at Long Length of Stay Meetings, dates discussed:    Additional Comments:  Leone Havenaylor, Kentrell Hallahan Clinton, RN 11/01/2015, 4:44 PM

## 2015-11-01 NOTE — Progress Notes (Addendum)
  Progress Note    11/01/2015 7:24 AM 1 Day Post-Op  Subjective:  No complaints; denies any trouble swallowing or breathing.  afebrile HR 70's NSR 110's-130's systolic 95% 3LO2NC now 96% on RA  Filed Vitals:   11/01/15 0400 11/01/15 0430  BP:  130/62  Pulse:  71  Temp: 98.5 F (36.9 C)   Resp:  17     Physical Exam: Neuro:  In tact; tongue midline; moving all extremities equally Lungs:  Non labored Incision:  Clean and dry with mild fullness  CBC    Component Value Date/Time   WBC 10.1 11/01/2015 0400   RBC 2.51* 11/01/2015 0400   RBC 3.43* 05/11/2014 1019   HGB 7.5* 11/01/2015 0400   HCT 23.1* 11/01/2015 0400   PLT 216 11/01/2015 0400   MCV 92.0 11/01/2015 0400   MCH 29.9 11/01/2015 0400   MCHC 32.5 11/01/2015 0400   RDW 13.8 11/01/2015 0400   LYMPHSABS 0.9 07/14/2013 0031   MONOABS 0.7 07/14/2013 0031   EOSABS 0.1 07/14/2013 0031   BASOSABS 0.1 07/14/2013 0031    BMET    Component Value Date/Time   NA 134* 11/01/2015 0400   NA 134 02/19/2014 1255   K 3.9 11/01/2015 0400   CL 109 11/01/2015 0400   CO2 19* 11/01/2015 0400   GLUCOSE 86 11/01/2015 0400   GLUCOSE 100* 02/19/2014 1255   BUN 15 11/01/2015 0400   BUN 14 02/19/2014 1255   CREATININE 1.20* 11/01/2015 0400   CREATININE 1.10* 08/08/2015 1056   CALCIUM 8.0* 11/01/2015 0400   GFRNONAA 41* 11/01/2015 0400   GFRNONAA 58* 02/17/2013 0947   GFRAA 47* 11/01/2015 0400   GFRAA 67 02/17/2013 0947     Intake/Output Summary (Last 24 hours) at 11/01/15 0724 Last data filed at 11/01/15 0500  Gross per 24 hour  Intake   2200 ml  Output     75 ml  Net   2125 ml     Assessment/Plan:  This is a 82 y.o. female who is s/p right CEA 1 Day Post-Op  -pt is doing well this am. -acute blood loss anemia-Hgb from 9.3 to 7.5 - pt is asymptomatic and blood pressure is tolerating.   -pt neuro exam is in tact -pt has ambulated -pt has voided -f/u with Dr. Myra GianottiBrabham in 2 weeks.   Doreatha MassedSamantha Rhyne,  PA-C [facility] [phone] Agree with above assessment Patient alert and oriented with good color Incision healing nicely Neurologic exam normal  Patient is ambulating without any dizziness or syncope and tolerating decreased hemoglobin from surgery  Discussed this with her and her daughter and she is already taking iron on a daily basis We'll discharge after lunch today if patient tolerates further ambulation and return in 2 weeks to see Dr. Myra GianottiBrabham

## 2015-11-01 NOTE — Discharge Summary (Signed)
Discharge Summary     [name] 1930/08/25 82 y.o. female  [id number]  Admission Date: 10/31/2015  Discharge Date: 11/01/15  Physician: Nada Libman, MD  Admission Diagnosis: Right carotid artery stenosis I65.21   HPI:   This is a 82 y.o. female who here for follow-up of her bilateral carotid artery stenosis and known innominate stenosis. Her most recent duplex revealed right ICA stenosis of 70% and 50-69% left ICA stenosis. The patient denies any TIA or stroke symptoms. She denies any sudden weakness or numbness of her extremities. She has macular degeneration but denies any amaurosis fugax. She denies receptive or expressive aphasia. She is back today to discuss her CT scan findings. There have been no interval changes.  Hospital Course:  The patient was admitted to the hospital and taken to the operating room on 10/31/2015 and underwent right carotid endarterectomy.  The pt tolerated the procedure well and was transported to the PACU in good condition.   By POD 1, the pt neuro status in tact.  Tongue is midline and moving extremities equally.  She does not have any trouble swallowing or breathing.    She does have an acute blood loss anemia, but is asymptomatic and BP is tolerating.  The remainder of the hospital course consisted of increasing mobilization and increasing intake of solids without difficulty.    Recent Labs  11/01/15 0400  NA 134*  K 3.9  CL 109  CO2 19*  GLUCOSE 86  BUN 15  CALCIUM 8.0*    Recent Labs  11/01/15 0400  WBC 10.1  HGB 7.5*  HCT 23.1*  PLT 216   No results for input(s): INR in the last 72 hours.   Discharge Instructions    CAROTID Sugery: Call MD for difficulty swallowing or speaking; weakness in arms or legs that is a new symtom; severe headache.  If you have increased swelling in the neck and/or  are having difficulty breathing, CALL 911    Complete by:  As directed      Call MD for:  redness, tenderness, or signs of  infection (pain, swelling, bleeding, redness, odor or green/yellow discharge around incision site)    Complete by:  As directed      Call MD for:  severe or increased pain, loss or decreased feeling  in affected limb(s)    Complete by:  As directed      Call MD for:  temperature >100.5    Complete by:  As directed      Discharge wound care:    Complete by:  As directed   Shower daily with soap and water starting 11/02/15     Driving Restrictions    Complete by:  As directed   No driving for 2 weeks     Lifting restrictions    Complete by:  As directed   No lifting for 2 weeks     Resume previous diet    Complete by:  As directed            Discharge Diagnosis:  Right carotid artery stenosis I65.21  Secondary Diagnosis: Patient Active Problem List   Diagnosis Date Noted  . Carotid artery stenosis 10/31/2015  . Carotid stenosis 06/11/2014  . PVD (peripheral vascular disease) with claudication (HCC) 06/11/2014  . Varicose veins of lower extremities with other complications 02/16/2014  . Neck pain-Posterior 11/30/2013  . Osteopenia, senile 11/22/2013  . Heart palpitations 07/14/2013  . Burning sensation of left arm 07/14/2013  . Arm  pain 07/14/2013  . Dizziness 09/12/2012  . Murmur 12/16/2010  . Hyperlipidemia LDL goal <100 07/17/2009  . Essential hypertension 07/17/2009  . CAROTID STENOSIS 06/21/2009   Past Medical History  Diagnosis Date  . Hypertension     a. Intolerant to Maxzide due to hyponatremia. b. intolerant to Clonidine due to slow HR/fatigue. c. Discrepancy in BP felt due to innominant stenosis.  . Hyperlipidemia   . Macular degeneration     In both eyes - no longer drives.  . Arthritis   . Hiatal hernia   . Chronic kidney disease   . DVT (deep venous thrombosis) (HCC)     a. Around age 82, details unclear (SVT vs DVT?), s/p vein stripping without recurrence.  . Anemia     a. "On/off my whole life" - etiology unclear. Has never required blood transfusion.   . Multiple thyroid nodules     a. By US 06/2013. Bx recommended.  Marland Kitchen. PFO (patent foramen ovale)     a. Per echo in 12/2010.  . Dizziness     a. Adm 06/2013: felt due to posterior circulation deficits from vertebrobasilar insufficiency and possibly vertigo.  Marland Kitchen. Heart murmur     a. Longstanding. mild MR on prior ECG, also may be in setting of known vasc dz.  . Varicose veins   . CAD (coronary artery disease)   . Carotid artery occlusion     RICA 80-99%, LICA 40-59% by dopplers 06/2013. Also with cerebrovascular disease otherwise on MRA 06/2013.  Marland Kitchen. PVD (peripheral vascular disease) (HCC)     a. R innominant stenosis. b. 06/2013 ABI: 0.80 R, 0.50 L., DVT- 1970's  . Carotid artery stenosis   . Depression   . Macular degeneration     L- macular degeneration, R eye impaired as well      Medication List    TAKE these medications        amLODipine 5 MG tablet  Commonly known as:  NORVASC  TAKE ONE TABLET BY MOUTH TWICE DAILY     aspirin 81 MG tablet  Take 81 mg by mouth daily.     atorvastatin 20 MG tablet  Commonly known as:  LIPITOR  Take 1 tablet (20 mg total) by mouth daily.     CALCITRATE PO  Take 1 tablet by mouth daily.     carvedilol 3.125 MG tablet  Commonly known as:  COREG  Take 1 tablet (3.125 mg total) by mouth daily.     cholecalciferol 1000 units tablet  Commonly known as:  VITAMIN D  Take 1,000 Units by mouth daily.     clopidogrel 75 MG tablet  Commonly known as:  PLAVIX  TAKE ONE TABLET BY MOUTH DAILY WITH BREAKFAST     ferrous sulfate 325 (65 FE) MG tablet  TAKE ONE TABLET BY MOUTH ONE TIME DAILY     hydrALAZINE 25 MG tablet  Commonly known as:  APRESOLINE  Take 2 tablets (50 mg total) by mouth 3 (three) times daily.     ibuprofen 200 MG tablet  Commonly known as:  ADVIL,MOTRIN  Take 200-400 mg by mouth daily as needed for mild pain or moderate pain.     lisinopril 40 MG tablet  Commonly known as:  PRINIVIL,ZESTRIL  TAKE ONE TABLET BY MOUTH  DAILY     PRESERVISION AREDS Caps  Take 1 capsule by mouth daily.     traMADol 50 MG tablet  Commonly known as:  ULTRAM  Take 1 tablet (50 mg total)  by mouth every 8 (eight) hours as needed.        Prescriptions given: Tramadol #20 No Refill  Instructions: 1.  Shower daily with soap and water starting 11/02/15  Disposition: home  Patient's condition: is Good  Follow up: 1. Dr. Myra Gianotti in 2 weeks.   Doreatha Massed, PA-C [facility] [phone]  --- For [facility] use --- Instructions: Press F2 to tab through selections.  Delete question if not applicable.   Modified Rankin score at D/C (0-6): 0  IV medication needed for:  1. Hypertension: No 2. Hypotension: No  Post-op Complications: No  1. Post-op CVA or TIA: No  If yes: Event classification (right eye, left eye, right cortical, left cortical, verterobasilar, other): n/a  If yes: Timing of event (intra-op, <6 hrs post-op, >=6 hrs post-op, unknown): n/a  2. CN injury: No  If yes: CN n/a injuried   3. Myocardial infarction: No  If yes: Dx by (EKG or clinical, Troponin): n/a  4.  CHF: No  5.  Dysrhythmia (new): No  6. Wound infection: No  7. Reperfusion symptoms: No  8. Return to OR: No  If yes: return to OR for (bleeding, neurologic, other CEA incision, other): n/a  Discharge medications: Statin use:  Yes If No:  For Medical reasons,  Non-compliant,  Not-indicated ASA use:  Yes  If No:  For Medical reasons,  Non-compliant,  Not-indicated Beta blocker use:  Yes If No:  For Medical reasons,  Non-compliant,  Not-indicated ACE-Inhibitor use:  Yes If No:  For Medical reasons,  Non-compliant,  Not-indicated ARB use:  No P2Y12 Antagonist use: Yes, [ x] Plavix,  Plasugrel,  Ticlopinine,  Ticagrelor,  Other,  No for medical reason,  Non-compliant,  Not-indicated Anti-coagulant use:  No,  Warfarin,  Rivaroxaban,   Dabigatran,  Other,  No for medical reason,  Non-compliant,  Not-indicated

## 2015-11-05 ENCOUNTER — Telehealth: Payer: Self-pay | Admitting: Surgery

## 2015-11-05 NOTE — Telephone Encounter (Signed)
LM for Heidi, dpm

## 2015-11-05 NOTE — Telephone Encounter (Signed)
-----   Message from Phillips Odorarol S Pullins, RN sent at 10/31/2015 11:50 AM EST ----- Regarding: needs 2 week f/u with [facility]   ----- Message -----    From: Dara LordsSamantha J Rhyne, PA-C    Sent: 10/31/2015   9:53 AM      To: [facility] Charge Pool  S/p right CEA 10/31/15.  F/u with [facility] in 2 weeks.  Thanks, Lelon MastSamantha

## 2015-11-06 ENCOUNTER — Other Ambulatory Visit: Payer: Self-pay | Admitting: Internal Medicine

## 2015-11-11 ENCOUNTER — Encounter: Payer: Self-pay | Admitting: Surgery

## 2015-11-15 ENCOUNTER — Other Ambulatory Visit: Payer: Self-pay | Admitting: *Deleted

## 2015-11-15 ENCOUNTER — Ambulatory Visit (INDEPENDENT_AMBULATORY_CARE_PROVIDER_SITE_OTHER): Payer: Medicare Other | Admitting: Surgery

## 2015-11-15 ENCOUNTER — Encounter: Payer: Self-pay | Admitting: Surgery

## 2015-11-15 VITALS — BP 134/67 | HR 68 | Temp 97.2°F | Resp 16 | Ht 67.0 in | Wt 130.0 lb

## 2015-11-15 DIAGNOSIS — I6523 Occlusion and stenosis of bilateral carotid arteries: Secondary | ICD-10-CM

## 2015-11-15 NOTE — Progress Notes (Signed)
Filed Vitals:   11/15/15 1526 11/15/15 1528 11/15/15 1529  BP: 177/63 175/64 134/67  Pulse: 83 68 68  Temp: 97.2 F (36.2 C)    Resp: 16    Height: 5\' 7"  (1.702 m)    Weight: 130 lb (58.968 kg)

## 2015-11-15 NOTE — Progress Notes (Signed)
Patient name: [name] MRN: [id number] DOB: 02-02-1931 Sex: female     Chief Complaint  Patient presents with  . Carotid    f/u    HISTORY OF PRESENT ILLNESS: The patient is back for her first postoperative visit.  On 10/31/2015 she underwent right carotid endarterectomy with patch angioplasty.  Intraoperative findings included 90% stenosis.  Her postoperative course was relatively straightforward.  She did have some issues with confusion related to anesthesia but these have all resolved.  She has no neurologic deficits.  Past Medical History  Diagnosis Date  . Hypertension     a. Intolerant to Maxzide due to hyponatremia. b. intolerant to Clonidine due to slow HR/fatigue. c. Discrepancy in BP felt due to innominant stenosis.  . Hyperlipidemia   . Macular degeneration     In both eyes - no longer drives.  . Arthritis   . Hiatal hernia   . Chronic kidney disease   . DVT (deep venous thrombosis) (HCC)     a. Around age 82, details unclear (SVT vs DVT?), s/p vein stripping without recurrence.  . Anemia     a. "On/off my whole life" - etiology unclear. Has never required blood transfusion.  . Multiple thyroid nodules     a. By US 06/2013. Bx recommended.  Marland Kitchen. PFO (patent foramen ovale)     a. Per echo in 12/2010.  . Dizziness     a. Adm 06/2013: felt due to posterior circulation deficits from vertebrobasilar insufficiency and possibly vertigo.  Marland Kitchen. Heart murmur     a. Longstanding. mild MR on prior ECG, also may be in setting of known vasc dz.  . Varicose veins   . CAD (coronary artery disease)   . Carotid artery occlusion     RICA 80-99%, LICA 40-59% by dopplers 06/2013. Also with cerebrovascular disease otherwise on MRA 06/2013.  Marland Kitchen. PVD (peripheral vascular disease) (HCC)     a. R innominant stenosis. b. 06/2013 ABI: 0.80 R, 0.50 L., DVT- 1970's  . Carotid artery stenosis   . Depression   . Macular degeneration     L- macular degeneration, R eye impaired as well     Past Surgical History  Procedure Laterality Date  . Cataract extraction    . Lumbar disc surgery    . Incontinence surgery    . Abdominal hysterectomy  1971  . Neck muscle surgery    . Varicose vein surgery      left leg  . Bladder surgery  2004    baldder sling  . Spine surgery    . Eye surgery      cataracts removed, /w IOL  . Cesarean section      X1  . Appendectomy    . Toe surgery Right     great toe with pins  . Endarterectomy Right 10/31/2015    Procedure: RIGHT CAROTID ENDARTERECTOMY;  Surgeon: Nada LibmanVance W Arnisha Laffoon, MD;  [facility];  Service: Vascular;  Laterality: Right;    Social History   Social History  . Marital Status: Widowed    Spouse Name: N/A  . Number of Children: 6  . Years of Education: 8th   Occupational History  . retired    Social History Main Topics  . Smoking status: Former Smoker    Quit date: 08/24/2009  . Smokeless tobacco: Never Used  . Alcohol Use: No  . Drug Use: No  . Sexual Activity: No   Other Topics Concern  . Not  on file   Social History Narrative    Family History  Problem Relation Age of Onset  . Cancer Mother     [name]  . Diabetes Sister   . Cancer Sister   . Hyperlipidemia Sister   . Heart attack Sister   . Hypertension Sister   . Diabetes Son   . Hyperlipidemia Daughter   . Hypertension Daughter   . Cancer Sister     Breast  . Hyperlipidemia Sister     Allergies as of 11/15/2015 - Review Complete 11/15/2015  Allergen Reaction Noted  . Penicillins Swelling and Rash 08/02/2009  . Clonidine derivatives Other (See Comments) 07/14/2013  . Maxzide [hydrochlorothiazide w-triamterene]  07/14/2013    Current Outpatient Prescriptions on File Prior to Visit  Medication Sig Dispense Refill  . amLODipine (NORVASC) 5 MG tablet TAKE ONE TABLET BY MOUTH TWICE DAILY 180 tablet 0  . aspirin 81 MG tablet Take 81 mg by mouth daily.      Marland Kitchen atorvastatin (LIPITOR) 20 MG tablet Take 1 tablet (20 mg total) by mouth  daily. (Patient taking differently: Take 20 mg by mouth daily at 6 PM. ) 90 tablet 3  . Calcium Citrate (CALCITRATE PO) Take 1 tablet by mouth daily.     . carvedilol (COREG) 3.125 MG tablet Take 1 tablet (3.125 mg total) by mouth daily. (Patient taking differently: Take 3.125 mg by mouth daily at 10 pm. ) 90 tablet 2  . cholecalciferol (VITAMIN D) 1000 UNITS tablet Take 1,000 Units by mouth daily.    . clopidogrel (PLAVIX) 75 MG tablet TAKE ONE TABLET BY MOUTH DAILY WITH BREAKFAST 90 tablet 1  . ferrous sulfate 325 (65 FE) MG tablet TAKE ONE TABLET BY MOUTH ONE TIME DAILY 30 tablet 7  . hydrALAZINE (APRESOLINE) 25 MG tablet Take 2 tablets (50 mg total) by mouth 3 (three) times daily. 540 tablet 2  . ibuprofen (ADVIL,MOTRIN) 200 MG tablet Take 200-400 mg by mouth daily as needed for mild pain or moderate pain.     Marland Kitchen lisinopril (PRINIVIL,ZESTRIL) 40 MG tablet TAKE ONE TABLET BY MOUTH DAILY 90 tablet 1  . Multiple Vitamins-Minerals (PRESERVISION AREDS) CAPS Take 1 capsule by mouth daily.     . traMADol (ULTRAM) 50 MG tablet Take 1 tablet (50 mg total) by mouth every 8 (eight) hours as needed. (Patient not taking: Reported on 11/15/2015) 20 tablet 0   No current facility-administered medications on file prior to visit.      PHYSICAL EXAMINATION:   Vital signs are  Filed Vitals:   11/15/15 1526 11/15/15 1528 11/15/15 1529  BP: 177/63 175/64 134/67  Pulse: 83 68 68  Temp: 97.2 F (36.2 C)    Resp: 16    Height:  (1.702 m)    Weight: 130 lb (58.968 kg)     Body mass index is 20.36 kg/(m^2). General: The patient appears their stated age. Neurologically intact Incision has healed nicely Respirations nonlabored    Assessment: Status post right carotid endarterectomy Plan: The patient has done very well from her operation.  She will follow up in 6 months with a repeat carotid ultrasound  V. Charlena Cross, M.D. [facility] [place] Office:  [phone] Pager:  [phone]

## 2015-11-16 ENCOUNTER — Other Ambulatory Visit: Payer: Self-pay | Admitting: Internal Medicine

## 2015-11-28 ENCOUNTER — Ambulatory Visit: Payer: Self-pay | Admitting: Internal Medicine

## 2015-12-12 ENCOUNTER — Encounter: Payer: Self-pay | Admitting: Internal Medicine

## 2015-12-12 ENCOUNTER — Ambulatory Visit (INDEPENDENT_AMBULATORY_CARE_PROVIDER_SITE_OTHER): Payer: Medicare Other | Admitting: Internal Medicine

## 2015-12-12 VITALS — BP 126/60 | HR 66 | Ht 67.0 in | Wt 127.0 lb

## 2015-12-12 DIAGNOSIS — I10 Essential (primary) hypertension: Secondary | ICD-10-CM | POA: Diagnosis not present

## 2015-12-12 DIAGNOSIS — R002 Palpitations: Secondary | ICD-10-CM

## 2015-12-12 DIAGNOSIS — E785 Hyperlipidemia, unspecified: Secondary | ICD-10-CM

## 2015-12-12 LAB — CBC
HEMATOCRIT: 25.6 % — AB (ref 35.0–45.0)
Hemoglobin: 8.5 g/dL — ABNORMAL LOW (ref 11.7–15.5)
MCH: 30.2 pg (ref 27.0–33.0)
MCHC: 33.2 g/dL (ref 32.0–36.0)
MCV: 91.1 fL (ref 80.0–100.0)
MPV: 10.5 fL (ref 7.5–12.5)
Platelets: 256 10*3/uL (ref 140–400)
RBC: 2.81 MIL/uL — ABNORMAL LOW (ref 3.80–5.10)
RDW: 14.3 % (ref 11.0–15.0)
WBC: 7 10*3/uL (ref 3.8–10.8)

## 2015-12-12 LAB — TSH: TSH: 2.57 m[IU]/L

## 2015-12-12 NOTE — Progress Notes (Signed)
Cardiology Office Note   Date:  12/12/2015   ID:  [name], DOB 07-29-1931, MRN [id number]  PCP:   Duane Lope, MD  Cardiologist:   Dietrich Pates, MD   F/U of HTN     History of Present Illness: [name] is a 82 y.o. female with a history of HTN, CV dz  HL  I sw her in September   Since seen she underwent carotid endarerctomy on R   On March 9   Breathing good  No CP  No palpitations  No dizziness      Outpatient Prescriptions Prior to Visit  Medication Sig Dispense Refill  . amLODipine (NORVASC) 5 MG tablet TAKE ONE TABLET BY MOUTH TWICE DAILY 180 tablet 0  . aspirin 81 MG tablet Take 81 mg by mouth daily.      Marland Kitchen atorvastatin (LIPITOR) 20 MG tablet Take 1 tablet (20 mg total) by mouth daily. (Patient taking differently: Take 20 mg by mouth daily at 6 PM. ) 90 tablet 3  . Calcium Citrate (CALCITRATE PO) Take 1 tablet by mouth daily.     . carvedilol (COREG) 3.125 MG tablet TAKE 1 TABLET (3.125 MG TOTAL) BY MOUTH DAILY. 90 tablet 1  . cholecalciferol (VITAMIN D) 1000 UNITS tablet Take 1,000 Units by mouth daily.    . clopidogrel (PLAVIX) 75 MG tablet TAKE ONE TABLET BY MOUTH DAILY WITH BREAKFAST 90 tablet 1  . ferrous sulfate 325 (65 FE) MG tablet TAKE ONE TABLET BY MOUTH ONE TIME DAILY 30 tablet 7  . hydrALAZINE (APRESOLINE) 25 MG tablet Take 2 tablets (50 mg total) by mouth 3 (three) times daily. 540 tablet 2  . ibuprofen (ADVIL,MOTRIN) 200 MG tablet Take 200-400 mg by mouth daily as needed for mild pain or moderate pain.     Marland Kitchen lisinopril (PRINIVIL,ZESTRIL) 40 MG tablet TAKE ONE TABLET BY MOUTH DAILY 90 tablet 1  . Multiple Vitamins-Minerals (PRESERVISION AREDS) CAPS Take 1 capsule by mouth daily.     . traMADol (ULTRAM) 50 MG tablet Take 1 tablet (50 mg total) by mouth every 8 (eight) hours as needed. 20 tablet 0   No facility-administered medications prior to visit.     Allergies:   Penicillins; Clonidine derivatives; and Maxzide   Past Medical History    Diagnosis Date  . Hypertension     a. Intolerant to Maxzide due to hyponatremia. b. intolerant to Clonidine due to slow HR/fatigue. c. Discrepancy in BP felt due to innominant stenosis.  . Hyperlipidemia   . Macular degeneration     In both eyes - no longer drives.  . Arthritis   . Hiatal hernia   . Chronic kidney disease   . DVT (deep venous thrombosis) (HCC)     a. Around age 11, details unclear (SVT vs DVT?), s/p vein stripping without recurrence.  . Anemia     a. "On/off my whole life" - etiology unclear. Has never required blood transfusion.  . Multiple thyroid nodules     a. By [place] 06/2013. Bx recommended.  Marland Kitchen PFO (patent foramen ovale)     a. Per echo in 12/2010.  . Dizziness     a. Adm 06/2013: felt due to posterior circulation deficits from vertebrobasilar insufficiency and possibly vertigo.  Marland Kitchen Heart murmur     a. Longstanding. mild MR on prior ECG, also may be in setting of known vasc dz.  . Varicose veins   . CAD (coronary artery disease)   . Carotid artery  occlusion     RICA 80-99%, LICA 40-59% by dopplers 06/2013. Also with cerebrovascular disease otherwise on MRA 06/2013.  Marland Kitchen. PVD (peripheral vascular disease) (HCC)     a. R innominant stenosis. b. 06/2013 ABI: 0.80 R, 0.50 L., DVT- 1970's  . Carotid artery stenosis   . Depression   . Macular degeneration     L- macular degeneration, R eye impaired as well    Past Surgical History  Procedure Laterality Date  . Cataract extraction    . Lumbar disc surgery    . Incontinence surgery    . Abdominal hysterectomy  1971  . Neck muscle surgery    . Varicose vein surgery      left leg  . Bladder surgery  2004    baldder sling  . Spine surgery    . Eye surgery      cataracts removed, /w IOL  . Cesarean section      X1  . Appendectomy    . Toe surgery Right     great toe with pins  . Endarterectomy Right 10/31/2015    Procedure: RIGHT CAROTID ENDARTERECTOMY;  Surgeon: Nada LibmanVance W Brabham, MD;  [facility];  Service:  Vascular;  Laterality: Right;     Social History:  The patient  reports that she quit smoking about 6 years ago. She has never used smokeless tobacco. She reports that she does not drink alcohol or use illicit drugs.   Family History:  The patient's family history includes Cancer in her mother, sister, and sister; Diabetes in her sister and son; Heart attack in her sister; Hyperlipidemia in her daughter, sister, and sister; Hypertension in her daughter and sister.    ROS:  Please see the history of present illness. All other systems are reviewed and  Negative to the above problem except as noted.    PHYSICAL EXAM: VS:  BP 126/60 mmHg  Pulse 66  Ht 5\' 7"  (1.702 m)  Wt 127 lb (57.607 kg)  BMI 19.89 kg/m2  GEN: Well nourished, well developed, in no acute distress HEENT: normal Neck: no JVD, carotid bruits, or masses Cardiac: RRR;  III/VI sytolic murmur LSB  rubs, or gallops,no edema  Respiratory:  clear to auscultation bilaterally, normal work of breathing GI: soft, nontender, nondistended, + BS  No hepatomegaly  MS: no deformity Moving all extremities   Skin: warm and dry, no rash Neuro:  Strength and sensation are intact Psych: euthymic mood, full affect   EKG:  EKG is not  ordered today.   Lipid Panel    Component Value Date/Time   CHOL 127 11/02/2014 0915   CHOL 169 02/19/2014 1255   CHOL 133 02/17/2013 0947   TRIG 91.0 11/02/2014 0915   TRIG CANCELED 02/19/2014 1255   TRIG 65 02/17/2013 0947   HDL 37.10* 11/02/2014 0915   HDL CANCELED 02/19/2014 1255   HDL 48 02/19/2014 1255   HDL 58 02/17/2013 0947   CHOLHDL 3 11/02/2014 0915   CHOLHDL 3.5 02/19/2014 1255   VLDL 18.2 11/02/2014 0915   LDLCALC 72 11/02/2014 0915   LDLCALC 97 02/19/2014 1255   LDLCALC 95 02/19/2014 0000   LDLCALC 62 02/17/2013 0947      Wt Readings from Last 3 Encounters:  12/12/15 127 lb (57.607 kg)  11/15/15 130 lb (58.968 kg)  10/31/15 128 lb (58.06 kg)      ASSESSMENT AND PLAN:  1   PVOD  S/p R CEA  Doing good   2.  HL  Check lipds   3  HTN COntinue meds      Will set f/u in 6 months  Check CBC, Lipid , TSH, BMET  Review echo  No AS reported in April 2016     Signed, Dietrich Pates, MD  12/12/2015 2:33 PM    [facility] [address] [place], [place], [place]  [address] Phone: [phone]; Fax: [phone]

## 2015-12-12 NOTE — Patient Instructions (Signed)
Your physician recommends that you continue on your current medications as directed. Please refer to the Current Medication list given to you today. Your physician recommends that you return for lab work in: today (cbc, bmet, tsh, lipids)  Your physician recommends that you schedule a follow-up appointment in: 6 months with Dr. Tenny Crawoss.

## 2015-12-13 LAB — LIPID PANEL
CHOLESTEROL: 137 mg/dL (ref 125–200)
HDL: 57 mg/dL (ref >=46)
LDL Cholesterol: 65 mg/dL (ref <130)
Total CHOL/HDL Ratio: 2.4 Ratio (ref <=5.0)
Triglycerides: 77 mg/dL (ref <150)
VLDL: 15 mg/dL (ref <30)

## 2015-12-13 LAB — BASIC METABOLIC PANEL
BUN: 14 mg/dL (ref 7–25)
CALCIUM: 7.9 mg/dL — AB (ref 8.6–10.4)
CO2: 22 mmol/L (ref 20–31)
CREATININE: 1.31 mg/dL — AB (ref 0.60–0.88)
Chloride: 109 mmol/L (ref 98–110)
Glucose, Bld: 107 mg/dL — ABNORMAL HIGH (ref 65–99)
Potassium: 4.7 mmol/L (ref 3.5–5.3)
Sodium: 141 mmol/L (ref 135–146)

## 2016-01-07 ENCOUNTER — Other Ambulatory Visit: Payer: Self-pay | Admitting: *Deleted

## 2016-01-07 DIAGNOSIS — Z79899 Other long term (current) drug therapy: Secondary | ICD-10-CM

## 2016-01-31 ENCOUNTER — Encounter (INDEPENDENT_AMBULATORY_CARE_PROVIDER_SITE_OTHER): Payer: Medicare Other | Admitting: Ophthalmology

## 2016-01-31 DIAGNOSIS — H43813 Vitreous degeneration, bilateral: Secondary | ICD-10-CM | POA: Diagnosis not present

## 2016-01-31 DIAGNOSIS — I10 Essential (primary) hypertension: Secondary | ICD-10-CM | POA: Diagnosis not present

## 2016-01-31 DIAGNOSIS — H35033 Hypertensive retinopathy, bilateral: Secondary | ICD-10-CM | POA: Diagnosis not present

## 2016-01-31 DIAGNOSIS — H353231 Exudative age-related macular degeneration, bilateral, with active choroidal neovascularization: Secondary | ICD-10-CM

## 2016-02-01 ENCOUNTER — Other Ambulatory Visit: Payer: Self-pay | Admitting: Internal Medicine

## 2016-02-12 ENCOUNTER — Other Ambulatory Visit (INDEPENDENT_AMBULATORY_CARE_PROVIDER_SITE_OTHER): Payer: Medicare Other | Admitting: *Deleted

## 2016-02-12 DIAGNOSIS — Z79899 Other long term (current) drug therapy: Secondary | ICD-10-CM | POA: Diagnosis not present

## 2016-02-12 LAB — CBC
HEMATOCRIT: 24.1 % — AB (ref 35.0–45.0)
HEMOGLOBIN: 7.9 g/dL — AB (ref 11.7–15.5)
MCH: 29.4 pg (ref 27.0–33.0)
MCHC: 32.8 g/dL (ref 32.0–36.0)
MCV: 89.6 fL (ref 80.0–100.0)
MPV: 10.1 fL (ref 7.5–12.5)
Platelets: 241 10*3/uL (ref 140–400)
RBC: 2.69 MIL/uL — AB (ref 3.80–5.10)
RDW: 14 % (ref 11.0–15.0)
WBC: 6.6 10*3/uL (ref 3.8–10.8)

## 2016-02-17 ENCOUNTER — Other Ambulatory Visit: Payer: Self-pay | Admitting: Internal Medicine

## 2016-03-18 ENCOUNTER — Telehealth: Payer: Self-pay | Admitting: Internal Medicine

## 2016-03-18 NOTE — Telephone Encounter (Signed)
New message         The daughter is calling she is worried about her Mom's iron level, the daughter wants a little more agressive in treating the low level

## 2016-03-20 NOTE — Telephone Encounter (Signed)
Need to forward to Miguel Aschoff (Primary MD) who should be aware and follow

## 2016-03-20 NOTE — Telephone Encounter (Signed)
Left detailed message on Heidi (daughter) voice mail (ok per DPR) that I have routed the information to patient's PCP and to follow up with them re: this issue.  Advised to call back to our office if we can be of any other assistance.

## 2016-05-01 ENCOUNTER — Other Ambulatory Visit: Payer: Self-pay | Admitting: Internal Medicine

## 2016-05-12 ENCOUNTER — Encounter: Payer: Self-pay | Admitting: Surgery

## 2016-05-15 ENCOUNTER — Other Ambulatory Visit: Payer: Self-pay | Admitting: Internal Medicine

## 2016-05-18 ENCOUNTER — Encounter: Payer: Self-pay | Admitting: Surgery

## 2016-05-18 ENCOUNTER — Ambulatory Visit (HOSPITAL_COMMUNITY)
Admission: RE | Admit: 2016-05-18 | Discharge: 2016-05-18 | Disposition: A | Discharge status: Home / Self Care | Payer: Medicare Other | Source: Ambulatory Visit | Attending: Surgery | Admitting: Surgery

## 2016-05-18 ENCOUNTER — Ambulatory Visit (INDEPENDENT_AMBULATORY_CARE_PROVIDER_SITE_OTHER): Payer: Medicare Other | Admitting: Surgery

## 2016-05-18 VITALS — BP 172/55 | HR 62 | Resp 18 | Ht 67.0 in | Wt 129.9 lb

## 2016-05-18 DIAGNOSIS — I6523 Occlusion and stenosis of bilateral carotid arteries: Secondary | ICD-10-CM | POA: Insufficient documentation

## 2016-05-18 DIAGNOSIS — I6521 Occlusion and stenosis of right carotid artery: Secondary | ICD-10-CM

## 2016-05-18 LAB — VAS US CAROTID
LCCADSYS: 126 cm/s
LEFT ECA DIAS: 34 cm/s
Left CCA dist dias: 16 cm/s
Left CCA prox dias: 20 cm/s
Left CCA prox sys: 147 cm/s
Left ICA dist dias: -19 cm/s
Left ICA dist sys: -153 cm/s
Left ICA prox dias: -71 cm/s
Left ICA prox sys: -434 cm/s
RCCADSYS: -95 cm/s
RCCAPDIAS: 16 cm/s
RIGHT CCA MID DIAS: -14 cm/s
RIGHT ECA DIAS: -12 cm/s
Right CCA prox sys: 124 cm/s

## 2016-05-18 NOTE — Progress Notes (Signed)
[facility] [place]  Patient name: [name] MRN: [id number] DOB: [birth year] Sex: female  REASON FOR VISIT: follow up  HPI: Jessica Moralesancy E Valera is a 82 y.o. female who is status post right carotid endarterectomy on 10/31/2015.  She continues to be followed for innominate artery stenosis as well as left carotid stenosis.  In the interval since I last saw her, she has had no neurologic symptoms.  She did however break her foot from a fall.  Past Medical History:  Diagnosis Date  . Anemia    a. "On/off my whole life" - etiology unclear. Has never required blood transfusion.  . Arthritis   . CAD (coronary artery disease)   . Carotid artery occlusion    RICA 80-99%, LICA 40-59% by dopplers 06/2013. Also with cerebrovascular disease otherwise on MRA 06/2013.  . Carotid artery stenosis   . Chronic kidney disease   . Depression   . Dizziness    a. Adm 06/2013: felt due to posterior circulation deficits from vertebrobasilar insufficiency and possibly vertigo.  Marland Kitchen. DVT (deep venous thrombosis) (HCC)    a. Around age 82, details unclear (SVT vs DVT?), s/p vein stripping without recurrence.  Marland Kitchen. Heart murmur    a. Longstanding. mild MR on prior ECG, also may be in setting of known vasc dz.  . Hiatal hernia   . Hyperlipidemia   . Hypertension    a. Intolerant to Maxzide due to hyponatremia. b. intolerant to Clonidine due to slow HR/fatigue. c. Discrepancy in BP felt due to innominant stenosis.  . Macular degeneration    In both eyes - no longer drives.  . Macular degeneration    L- macular degeneration, R eye impaired as well  . Multiple thyroid nodules    a. By US 06/2013. Bx recommended.  Marland Kitchen. PFO (patent foramen ovale)    a. Per echo in 12/2010.  Marland Kitchen. PVD (peripheral vascular disease) (HCC)    a. R innominant stenosis. b. 06/2013 ABI: 0.80 R, 0.50 L., DVT- 1970's  . Varicose veins     Family History  Problem Relation Age of Onset  .  Cancer Mother     Raj JanusUterian  . Diabetes Sister   . Cancer Sister   . Hyperlipidemia Sister   . Heart attack Sister   . Hypertension Sister   . Cancer Sister     Breast  . Hyperlipidemia Sister   . Diabetes Son   . Hyperlipidemia Daughter   . Hypertension Daughter     SOCIAL HISTORY: Social History  Substance Use Topics  . Smoking status: Former Smoker    Quit date: 08/24/2009  . Smokeless tobacco: Never Used  . Alcohol use No    Allergies  Allergen Reactions  . Penicillins Swelling and Rash    Has patient had a PCN reaction causing immediate rash, facial/tongue/throat swelling, SOB or lightheadedness with hypotension: Unknown Has patient had a PCN reaction causing severe rash involving mucus membranes or skin necrosis: No Has patient had a PCN reaction that required hospitalization No Has patient had a PCN reaction occurring within the last 10 years: No If all of the above answers are "NO", then may proceed with Cephalosporin use.   . Clonidine Derivatives Other (See Comments)    Fatigue Bradycardia   . Maxzide [Hydrochlorothiazide W-Triamterene]     hyponatremia    Current Outpatient Prescriptions  Medication Sig Dispense Refill  . amLODipine (NORVASC) 5 MG tablet TAKE ONE TABLET BY MOUTH TWICE DAILY 180 tablet  3  . aspirin 81 MG tablet Take 81 mg by mouth daily.      Marland Kitchen atorvastatin (LIPITOR) 20 MG tablet TAKE 1 TABLET (20 MG TOTAL) BY MOUTH DAILY. 90 tablet 2  . Calcium Citrate (CALCITRATE PO) Take 1 tablet by mouth daily.     . carvedilol (COREG) 3.125 MG tablet TAKE 1 TABLET (3.125 MG TOTAL) BY MOUTH DAILY. 90 tablet 1  . cholecalciferol (VITAMIN D) 1000 UNITS tablet Take 1,000 Units by mouth daily.    . clopidogrel (PLAVIX) 75 MG tablet TAKE ONE TABLET BY MOUTH DAILY WITH BREAKFAST 90 tablet 1  . ferrous sulfate 325 (65 FE) MG tablet TAKE ONE TABLET BY MOUTH ONE TIME DAILY (Patient taking differently: TAKE TWO TABLET BY MOUTH ONE TIME DAILY) 90 tablet 1  .  hydrALAZINE (APRESOLINE) 25 MG tablet Take 2 tablets (50 mg total) by mouth 3 (three) times daily. 540 tablet 2  . ibuprofen (ADVIL,MOTRIN) 200 MG tablet Take 200-400 mg by mouth daily as needed for mild pain or moderate pain.     Marland Kitchen lisinopril (PRINIVIL,ZESTRIL) 40 MG tablet TAKE ONE TABLET BY MOUTH DAILY 90 tablet 1  . Multiple Vitamins-Minerals (PRESERVISION AREDS) CAPS Take 1 capsule by mouth daily.      No current facility-administered medications for this visit.     REVIEW OF SYSTEMS:  [X]  denotes positive finding, [ ]  denotes negative finding Cardiac  Comments:  Chest pain or chest pressure:    Shortness of breath upon exertion:    Short of breath when lying flat:    Irregular heart rhythm:        Vascular    Pain in calf, thigh, or hip brought on by ambulation:    Pain in feet at night that wakes you up from your sleep:     Blood clot in your veins:    Leg swelling:         Pulmonary    Oxygen at home:    Productive cough:     Wheezing:         Neurologic    Sudden weakness in arms or legs:     Sudden numbness in arms or legs:     Sudden onset of difficulty speaking or slurred speech:    Temporary loss of vision in one eye:     Problems with dizziness:         Gastrointestinal    Blood in stool:     Vomited blood:         Genitourinary    Burning when urinating:     Blood in urine:        Psychiatric    Major depression:         Hematologic    Bleeding problems:    Problems with blood clotting too easily:        Skin    Rashes or ulcers:        Constitutional    Fever or chills:      PHYSICAL EXAM: Vitals:   05/18/16 0950 05/18/16 0952  BP: (!) 106/56 (!) 172/55  Pulse: 62   Resp: 18   Weight: 129 lb 14.4 oz (58.9 kg)   Height: 5\' 7"  (1.702 m)     GENERAL: The patient is a well-nourished female, in no acute distress. The vital signs are documented above. CARDIAC: There is a regular rate and rhythm.  VASCULAR: No carotid bruits.  Palpable  right radial pulse PULMONARY: There is good air exchange  bilaterally without wheezing or rales. MUSCULOSKELETAL: There are no major deformities or cyanosis.  Tenderness in left foot NEUROLOGIC: No focal weakness or paresthesias are detected. SKIN: There are no ulcers or rashes noted. PSYCHIATRIC: The patient has a normal affect.  DATA:  Carotid duplex was ordered and reviewed today.  This shows a widely patent right carotid endarterectomy site.  The left side stenosis remains stable in the 60-79% category.  MEDICAL ISSUES: Carotid stenosis: The patient remained asymptomatic.  We will continue with ultrasound surveillance in 6 months.    Durene Cal, MD [facility] [phone] Pager [phone]

## 2016-05-20 ENCOUNTER — Ambulatory Visit: Payer: Self-pay | Admitting: Hematology & Oncology

## 2016-05-20 ENCOUNTER — Other Ambulatory Visit: Payer: Self-pay

## 2016-05-20 ENCOUNTER — Ambulatory Visit: Payer: Self-pay

## 2016-06-01 ENCOUNTER — Other Ambulatory Visit (HOSPITAL_BASED_OUTPATIENT_CLINIC_OR_DEPARTMENT_OTHER): Payer: Medicare Other

## 2016-06-01 ENCOUNTER — Ambulatory Visit: Payer: Medicare Other

## 2016-06-01 ENCOUNTER — Ambulatory Visit (HOSPITAL_BASED_OUTPATIENT_CLINIC_OR_DEPARTMENT_OTHER): Payer: Medicare Other | Admitting: Hematology & Oncology

## 2016-06-01 VITALS — BP 135/51 | HR 67 | Temp 97.5°F | Wt 130.8 lb

## 2016-06-01 DIAGNOSIS — N183 Chronic kidney disease, stage 3 unspecified: Secondary | ICD-10-CM

## 2016-06-01 DIAGNOSIS — D508 Other iron deficiency anemias: Secondary | ICD-10-CM | POA: Diagnosis not present

## 2016-06-01 DIAGNOSIS — D510 Vitamin B12 deficiency anemia due to intrinsic factor deficiency: Secondary | ICD-10-CM | POA: Diagnosis not present

## 2016-06-01 DIAGNOSIS — D631 Anemia in chronic kidney disease: Secondary | ICD-10-CM

## 2016-06-01 LAB — CBC WITH DIFFERENTIAL (CANCER CENTER ONLY)
BASO#: 0.1 10*3/uL (ref 0.0–0.2)
BASO%: 1.1 % (ref 0.0–2.0)
EOS ABS: 0.2 10*3/uL (ref 0.0–0.5)
EOS%: 3.6 % (ref 0.0–7.0)
HEMATOCRIT: 24.2 % — AB (ref 34.8–46.6)
HEMOGLOBIN: 8 g/dL — AB (ref 11.6–15.9)
LYMPH#: 0.9 10*3/uL (ref 0.9–3.3)
LYMPH%: 13.1 % — ABNORMAL LOW (ref 14.0–48.0)
MCH: 30.8 pg (ref 26.0–34.0)
MCHC: 33.1 g/dL (ref 32.0–36.0)
MCV: 93 fL (ref 81–101)
MONO#: 0.7 10*3/uL (ref 0.1–0.9)
MONO%: 11.2 % (ref 0.0–13.0)
NEUT%: 71 % (ref 39.6–80.0)
NEUTROS ABS: 4.7 10*3/uL (ref 1.5–6.5)
Platelets: 223 10*3/uL (ref 145–400)
RBC: 2.6 10*6/uL — ABNORMAL LOW (ref 3.70–5.32)
RDW: 13.6 % (ref 11.1–15.7)
WBC: 6.6 10*3/uL (ref 3.9–10.0)

## 2016-06-01 LAB — COMPREHENSIVE METABOLIC PANEL (CC13)
ALT: 10 IU/L (ref 0–32)
AST (SGOT): 17 IU/L (ref 0–40)
Albumin, Serum: 3.6 g/dL (ref 3.5–4.7)
Albumin/Globulin Ratio: 1.6 (ref 1.2–2.2)
Alkaline Phosphatase, S: 62 IU/L (ref 39–117)
BUN/Creatinine Ratio: 13 (ref 12–28)
BUN: 20 mg/dL (ref 8–27)
Bilirubin Total: 0.2 mg/dL (ref 0.0–1.2)
CALCIUM: 8.6 mg/dL — AB (ref 8.7–10.3)
Carbon Dioxide, Total: 22 mmol/L (ref 18–29)
Chloride, Ser: 106 mmol/L (ref 96–106)
Creatinine, Ser: 1.49 mg/dL — ABNORMAL HIGH (ref 0.57–1.00)
GFR, EST AFRICAN AMERICAN: 37 mL/min/{1.73_m2} — AB (ref >59)
GFR, EST NON AFRICAN AMERICAN: 32 mL/min/{1.73_m2} — AB (ref >59)
GLUCOSE: 102 mg/dL — AB (ref 65–99)
Globulin, Total: 2.3 g/dL (ref 1.5–4.5)
Potassium, Ser: 4.7 mmol/L (ref 3.5–5.2)
Sodium: 136 mmol/L (ref 134–144)
TOTAL PROTEIN: 5.9 g/dL — AB (ref 6.0–8.5)

## 2016-06-01 LAB — CHCC SATELLITE - SMEAR

## 2016-06-02 ENCOUNTER — Encounter: Payer: Self-pay | Admitting: Hematology & Oncology

## 2016-06-02 DIAGNOSIS — N183 Chronic kidney disease, stage 3 unspecified: Secondary | ICD-10-CM

## 2016-06-02 DIAGNOSIS — D510 Vitamin B12 deficiency anemia due to intrinsic factor deficiency: Secondary | ICD-10-CM

## 2016-06-02 DIAGNOSIS — D631 Anemia in chronic kidney disease: Secondary | ICD-10-CM

## 2016-06-02 HISTORY — DX: Chronic kidney disease, stage 3 unspecified: N18.30

## 2016-06-02 HISTORY — DX: Vitamin B12 deficiency anemia due to intrinsic factor deficiency: D51.0

## 2016-06-02 HISTORY — DX: Anemia in chronic kidney disease: D63.1

## 2016-06-02 LAB — IRON AND TIBC
%SAT: 27 % (ref 21–57)
IRON: 68 ug/dL (ref 41–142)
TIBC: 256 ug/dL (ref 236–444)
UIBC: 188 ug/dL (ref 120–384)

## 2016-06-02 LAB — RETICULOCYTES: RETICULOCYTE COUNT: 1.3 % (ref 0.6–2.6)

## 2016-06-02 LAB — HAPTOGLOBIN: HAPTOGLOBIN: 43 mg/dL (ref 34–200)

## 2016-06-02 LAB — FERRITIN: Ferritin: 107 ng/ml (ref 9–269)

## 2016-06-02 LAB — VITAMIN B12: Vitamin B12: 206 pg/mL — ABNORMAL LOW (ref 211–946)

## 2016-06-02 LAB — TSH: TSH: 2.721 m[IU]/L (ref 0.308–3.960)

## 2016-06-02 LAB — ERYTHROPOIETIN: ERYTHROPOIETIN: 10 m[IU]/mL (ref 2.6–18.5)

## 2016-06-02 LAB — LACTATE DEHYDROGENASE: LDH: 235 U/L (ref 125–245)

## 2016-06-02 NOTE — Progress Notes (Signed)
Referral MD  Reason for Referral: Normochromic and normocytic anemia   Chief Complaint  Patient presents with  . Follow-up  : I have been anemic my entire life.  HPI: [name] is a very charming 82 year old white female. I must say that she is very spunky. She is very chatty area and she comes in with her daughter.  She has been told that she has been anemic her whole life. She never has had a transfusion. She never has had any kind of workup from what she tells me.  Back in August, a CBC was done. This was done by Dr. Radene Ou. This showed a white cell count 6.3. Hemoglobin 8. Hematocrit 24.9. Platelet count 203,000. Her MCV was 91. Her serum iron was 66. Her iron saturation was 26%. No other labs were done.  She's had no obvious bleeding. There's been no melena or bright red blood per rectum. She is not sure when she had her last colonoscopy.  In September, her white cell count 6.7. Hemoglobin 7.8. Hematocrit 24.3. Platelet count 221K. Her MCV was 92. Her ferritin was 104.   She has had multiple surgeries in the past. She has had carotid endarterectomy. She does have chronic renal insufficiency. She has a macular degeneration. She has hyperlipidemia.  She has been on quite a few medications. She is on Plavix and low-dose aspirin.  She is not a vegetarian. She has a good appetite. Her weight has been holding pretty steady.  She's had no rashes. She's had no swollen lymph nodes. She's had no cough or shortness of breath.  Overall, I see that her performance status is ECOG 2.                Past Medical History:  Diagnosis Date  . Anemia    a. "On/off my whole life" - etiology unclear. Has never required blood transfusion.  . Arthritis   . CAD (coronary artery disease)   . Carotid artery occlusion    RICA 60-04%, LICA 59-97% by dopplers 06/2013. Also with cerebrovascular disease otherwise on MRA 06/2013.  . Carotid artery stenosis   . Chronic kidney disease   .  Depression   . Dizziness    a. Adm 06/2013: felt due to posterior circulation deficits from vertebrobasilar insufficiency and possibly vertigo.  Marland Kitchen DVT (deep venous thrombosis) ([place])    a. Around age 71, details unclear (SVT vs DVT?), s/p vein stripping without recurrence.  Marland Kitchen Heart murmur    a. Longstanding. mild MR on prior ECG, also may be in setting of known vasc dz.  . Hiatal hernia   . Hyperlipidemia   . Hypertension    a. Intolerant to Maxzide due to hyponatremia. b. intolerant to Clonidine due to slow HR/fatigue. c. Discrepancy in BP felt due to innominant stenosis.  . Macular degeneration    In both eyes - no longer drives.  . Macular degeneration    L- macular degeneration, R eye impaired as well  . Multiple thyroid nodules    a. By [place] 06/2013. Bx recommended.  Marland Kitchen PFO (patent foramen ovale)    a. Per echo in 12/2010.  Marland Kitchen PVD (peripheral vascular disease) ([place])    a. R innominant stenosis. b. 06/2013 ABI: 0.80 R, 0.50 L., DVT- 1970's  . Varicose veins   :  Past Surgical History:  Procedure Laterality Date  . ABDOMINAL HYSTERECTOMY  1971  . APPENDECTOMY    . BLADDER SURGERY  2004   baldder sling  . CATARACT EXTRACTION    .  CESAREAN SECTION     X1  . ENDARTERECTOMY Right 10/31/2015   Procedure: RIGHT CAROTID ENDARTERECTOMY;  Surgeon: Serafina Mitchell, MD;  Location: [facility];  Service: Vascular;  Laterality: Right;  . EYE SURGERY     cataracts removed, /w IOL  . INCONTINENCE SURGERY    . LUMBAR DISC SURGERY    . Neck Muscle Surgery    . SPINE SURGERY    . TOE SURGERY Right    great toe with pins  . VARICOSE VEIN SURGERY     left leg  :   Current Outpatient Prescriptions:  .  amLODipine (NORVASC) 5 MG tablet, TAKE ONE TABLET BY MOUTH TWICE DAILY, Disp: 180 tablet, Rfl: 3 .  aspirin 81 MG tablet, Take 81 mg by mouth daily.  , Disp: , Rfl:  .  atorvastatin (LIPITOR) 20 MG tablet, TAKE 1 TABLET (20 MG TOTAL) BY MOUTH DAILY., Disp: 90 tablet, Rfl: 2 .  Calcium Citrate  (CALCITRATE PO), Take 1 tablet by mouth daily. , Disp: , Rfl:  .  carvedilol (COREG) 3.125 MG tablet, TAKE 1 TABLET (3.125 MG TOTAL) BY MOUTH DAILY., Disp: 90 tablet, Rfl: 1 .  cholecalciferol (VITAMIN D) 1000 UNITS tablet, Take 1,000 Units by mouth daily., Disp: , Rfl:  .  clopidogrel (PLAVIX) 75 MG tablet, TAKE ONE TABLET BY MOUTH DAILY WITH BREAKFAST, Disp: 90 tablet, Rfl: 1 .  ferrous sulfate 325 (65 FE) MG tablet, TAKE ONE TABLET BY MOUTH ONE TIME DAILY (Patient taking differently: TAKE TWO TABLET BY MOUTH three times daily), Disp: 90 tablet, Rfl: 1 .  hydrALAZINE (APRESOLINE) 25 MG tablet, Take 2 tablets (50 mg total) by mouth 3 (three) times daily., Disp: 540 tablet, Rfl: 2 .  ibuprofen (ADVIL,MOTRIN) 200 MG tablet, Take 200-400 mg by mouth daily as needed for mild pain or moderate pain. , Disp: , Rfl:  .  lisinopril (PRINIVIL,ZESTRIL) 40 MG tablet, TAKE ONE TABLET BY MOUTH DAILY, Disp: 90 tablet, Rfl: 1 .  Multiple Vitamins-Minerals (PRESERVISION AREDS) CAPS, Take 1 capsule by mouth daily. , Disp: , Rfl: :  :  Allergies  Allergen Reactions  . Penicillins Swelling and Rash    Has patient had a PCN reaction causing immediate rash, facial/tongue/throat swelling, SOB or lightheadedness with hypotension: Unknown Has patient had a PCN reaction causing severe rash involving mucus membranes or skin necrosis: No Has patient had a PCN reaction that required hospitalization No Has patient had a PCN reaction occurring within the last 10 years: No If all of the above answers are "NO", then may proceed with Cephalosporin use.   . Clonidine Derivatives Other (See Comments)    Fatigue Bradycardia   . Maxzide [Hydrochlorothiazide W-Triamterene]     hyponatremia  :  Family History  Problem Relation Age of Onset  . Cancer Mother     Theadora Rama  . Diabetes Sister   . Cancer Sister   . Hyperlipidemia Sister   . Heart attack Sister   . Hypertension Sister   . Cancer Sister     Breast  .  Hyperlipidemia Sister   . Diabetes Son   . Hyperlipidemia Daughter   . Hypertension Daughter   :  Social History   Social History  . Marital status: Widowed    Spouse name: N/A  . Number of children: 6  . Years of education: 8th   Occupational History  . retired Retired   Social History Main Topics  . Smoking status: Former Smoker    Quit date:  08/24/2009  . Smokeless tobacco: Never Used  . Alcohol use No  . Drug use: No  . Sexual activity: No   Other Topics Concern  . Not on file   Social History Narrative  . No narrative on file  :  Pertinent items are noted in HPI.  Exam: _0 @ Elderly white female in no obvious distress. Her vital signs show temperature or of 97.5. Pulse 67. Blood pressure 135/51. Weight is 131 pounds. Head and neck exam shows no ocular or oral lesions. There are no palpable cervical or supraclavicular lymph nodes. Lungs are clear bilaterally. Cardiac exam regular rate and rhythm with no murmurs, rubs or bruits. Abdomen is soft. She has decent bowel sounds. There is no fluid wave. There is no palpable liver or spleen tip. Back exam shows some slight kyphosis. She has no tenderness over the spine, ribs or hips. Extremities shows no clubbing, cyanosis or edema. Neurological exam shows no focal neurological deficits. Skin exam shows no rashes, ecchymoses or petechia.    Recent Labs  06/01/16 1537  WBC 6.6  HGB 8.0*  HCT 24.2*  PLT 223    Recent Labs  06/01/16 1537  NA 136  K 4.7  CL 106  CO2 22  GLUCOSE 102*  BUN 20  CREATININE 1.49*  CALCIUM 8.6*    Blood smear review:  Normochromic and normocytic population of red blood cells. She has no nucleated red blood cells. There are no teardrop cells. I see no rouleau formation. She has no target cells. There are no schistocytes or spherocytes. White cells been normal in morphology maturation. She has no immature myeloid lymphoid forms. She has no hypersegmented polys. Platelets are adequate  in number and size. Platelets are pretty well granulated.  Pathology: None     Assessment and Plan:  [name] is an 82 year old white female. She does have multiple medical problems. She has significant anemia. She does have renal insufficiency.  Her erythropoietin level is only 10. I did this way most of her problem. I think she just has erythropoietin deficiency.  Her vitamin B-12 level is also low. It is 206.  I do not have back her iron studies as of yet.  Given her age, myelodysplasia is always a possibility. There is nothing on her blood smear that would suggest an underlying hematologic malignancy.  Ultimately, she may need to have a bone marrow test done. I would hate to had to do this and it would only do this if all of her other tests are normal.  I will await for the results to come back. She will clearly need Aranesp. I will also probably will give her a dose of vitamin B-12. Whether or not she needs iron remains to be seen right now.  She does still have underlying myelodysplasia but again, I think a bone marrow biopsy would be necessary to evaluate for this.  I spent about an hour she and her daughter. They're both very nice. As I said before, [name] is very spunky and you definitely know where you stand with her.  I probably will get her back in 2 or 3 weeks so we can start her on Aranesp. Marland Kitchen

## 2016-06-03 LAB — PROTEIN ELECTROPHORESIS, SERUM, WITH REFLEX
A/G Ratio: 1.4 (ref 0.7–1.7)
ALPHA 1: 0.3 g/dL (ref 0.0–0.4)
ALPHA 2: 0.7 g/dL (ref 0.4–1.0)
Albumin: 3.2 g/dL (ref 2.9–4.4)
BETA: 0.8 g/dL (ref 0.7–1.3)
Gamma Globulin: 0.5 g/dL (ref 0.4–1.8)
Globulin, Total: 2.3 g/dL (ref 2.2–3.9)
PDF: 0
Total Protein: 5.5 g/dL — ABNORMAL LOW (ref 6.0–8.5)

## 2016-06-03 LAB — HEMOGLOBINOPATHY EVALUATION
HEMOGLOBIN A2 QUANTITATION: 2.7 % (ref 0.7–3.1)
HEMOGLOBIN F QUANTITATION: 0 % (ref 0.0–2.0)
HGB A: 97.3 % (ref 94.0–98.0)
HGB C: 0 %
HGB S: 0 %

## 2016-06-19 ENCOUNTER — Encounter (INDEPENDENT_AMBULATORY_CARE_PROVIDER_SITE_OTHER): Payer: Medicare Other | Admitting: Ophthalmology

## 2016-07-02 ENCOUNTER — Other Ambulatory Visit: Payer: Self-pay | Admitting: *Deleted

## 2016-07-03 ENCOUNTER — Ambulatory Visit: Payer: Medicare Other | Admitting: Hematology & Oncology

## 2016-07-03 ENCOUNTER — Other Ambulatory Visit: Payer: Medicare Other

## 2016-07-03 ENCOUNTER — Ambulatory Visit: Payer: Medicare Other

## 2016-07-08 ENCOUNTER — Other Ambulatory Visit: Payer: Self-pay | Admitting: *Deleted

## 2016-07-08 ENCOUNTER — Emergency Department (HOSPITAL_COMMUNITY): Payer: Medicare Other

## 2016-07-08 ENCOUNTER — Emergency Department (HOSPITAL_COMMUNITY)
Admission: EM | Admit: 2016-07-08 | Discharge: 2016-07-08 | Disposition: A | Discharge status: Home / Self Care | Payer: Medicare Other | Attending: Emergency Medicine | Admitting: Emergency Medicine

## 2016-07-08 DIAGNOSIS — I2510 Atherosclerotic heart disease of native coronary artery without angina pectoris: Secondary | ICD-10-CM | POA: Diagnosis not present

## 2016-07-08 DIAGNOSIS — Z87891 Personal history of nicotine dependence: Secondary | ICD-10-CM | POA: Diagnosis not present

## 2016-07-08 DIAGNOSIS — I129 Hypertensive chronic kidney disease with stage 1 through stage 4 chronic kidney disease, or unspecified chronic kidney disease: Secondary | ICD-10-CM | POA: Diagnosis not present

## 2016-07-08 DIAGNOSIS — Z7982 Long term (current) use of aspirin: Secondary | ICD-10-CM | POA: Diagnosis not present

## 2016-07-08 DIAGNOSIS — N183 Chronic kidney disease, stage 3 (moderate): Secondary | ICD-10-CM | POA: Diagnosis not present

## 2016-07-08 DIAGNOSIS — K3589 Other acute appendicitis without perforation or gangrene: Secondary | ICD-10-CM

## 2016-07-08 DIAGNOSIS — R1010 Upper abdominal pain, unspecified: Secondary | ICD-10-CM

## 2016-07-08 DIAGNOSIS — R109 Unspecified abdominal pain: Secondary | ICD-10-CM

## 2016-07-08 DIAGNOSIS — R140 Abdominal distension (gaseous): Secondary | ICD-10-CM | POA: Diagnosis present

## 2016-07-08 DIAGNOSIS — Z79899 Other long term (current) drug therapy: Secondary | ICD-10-CM | POA: Insufficient documentation

## 2016-07-08 LAB — COMPREHENSIVE METABOLIC PANEL
ALBUMIN: 3.4 g/dL — AB (ref 3.5–5.0)
ALT: 13 U/L — ABNORMAL LOW (ref 14–54)
ANION GAP: 7 (ref 5–15)
AST: 22 U/L (ref 15–41)
Alkaline Phosphatase: 59 U/L (ref 38–126)
BUN: 20 mg/dL (ref 6–20)
CHLORIDE: 111 mmol/L (ref 101–111)
CO2: 20 mmol/L — AB (ref 22–32)
Calcium: 8.7 mg/dL — ABNORMAL LOW (ref 8.9–10.3)
Creatinine, Ser: 1.58 mg/dL — ABNORMAL HIGH (ref 0.44–1.00)
GFR calc non Af Amer: 29 mL/min — ABNORMAL LOW (ref >60)
GFR, EST AFRICAN AMERICAN: 34 mL/min — AB (ref >60)
GLUCOSE: 117 mg/dL — AB (ref 65–99)
Potassium: 4.7 mmol/L (ref 3.5–5.1)
SODIUM: 138 mmol/L (ref 135–145)
Total Bilirubin: 0.6 mg/dL (ref 0.3–1.2)
Total Protein: 6 g/dL — ABNORMAL LOW (ref 6.5–8.1)

## 2016-07-08 LAB — URINALYSIS, ROUTINE W REFLEX MICROSCOPIC
Bilirubin Urine: NEGATIVE
Glucose, UA: NEGATIVE mg/dL
Hgb urine dipstick: NEGATIVE
Ketones, ur: NEGATIVE mg/dL
LEUKOCYTES UA: NEGATIVE
NITRITE: NEGATIVE
Specific Gravity, Urine: 1.012 (ref 1.005–1.030)
pH: 7 (ref 5.0–8.0)

## 2016-07-08 LAB — CBC WITH DIFFERENTIAL/PLATELET
BASOS PCT: 1 %
Basophils Absolute: 0.1 10*3/uL (ref 0.0–0.1)
EOS ABS: 0.1 10*3/uL (ref 0.0–0.7)
EOS PCT: 2 %
HCT: 25.8 % — ABNORMAL LOW (ref 36.0–46.0)
Hemoglobin: 8.4 g/dL — ABNORMAL LOW (ref 12.0–15.0)
LYMPHS ABS: 0.9 10*3/uL (ref 0.7–4.0)
Lymphocytes Relative: 12 %
MCH: 29.1 pg (ref 26.0–34.0)
MCHC: 32.6 g/dL (ref 30.0–36.0)
MCV: 89.3 fL (ref 78.0–100.0)
Monocytes Absolute: 0.5 10*3/uL (ref 0.1–1.0)
Monocytes Relative: 7 %
NEUTROS PCT: 78 %
Neutro Abs: 6.2 10*3/uL (ref 1.7–7.7)
PLATELETS: 232 10*3/uL (ref 150–400)
RBC: 2.89 MIL/uL — AB (ref 3.87–5.11)
RDW: 13.7 % (ref 11.5–15.5)
WBC: 7.8 10*3/uL (ref 4.0–10.5)

## 2016-07-08 LAB — URINE MICROSCOPIC-ADD ON: RBC / HPF: NONE SEEN RBC/hpf (ref 0–5)

## 2016-07-08 LAB — LIPASE, BLOOD: Lipase: 42 U/L (ref 11–51)

## 2016-07-08 LAB — TROPONIN I

## 2016-07-08 MED ORDER — ONDANSETRON HCL 4 MG/2ML IJ SOLN
4.0000 mg | Freq: Once | INTRAMUSCULAR | Status: AC
  Administered 2016-07-08: 4 mg via INTRAVENOUS
  Filled 2016-07-08: qty 2

## 2016-07-08 MED ORDER — HYDROCODONE-ACETAMINOPHEN 5-325 MG PO TABS: 1.0000 | ORAL_TABLET | ORAL | 0 refills | Status: DC | PRN

## 2016-07-08 MED ORDER — MORPHINE SULFATE (PF) 4 MG/ML IV SOLN
2.0000 mg | Freq: Once | INTRAVENOUS | Status: AC
  Administered 2016-07-08: 2 mg via INTRAVENOUS
  Filled 2016-07-08: qty 1

## 2016-07-08 MED ORDER — SODIUM CHLORIDE 0.9 % IV SOLN: INTRAVENOUS | Status: DC

## 2016-07-08 MED ORDER — IOPAMIDOL (ISOVUE-300) INJECTION 61%
INTRAVENOUS | Status: AC
  Filled 2016-07-08: qty 30

## 2016-07-08 NOTE — ED Notes (Signed)
Pt back from CT

## 2016-07-08 NOTE — ED Provider Notes (Signed)
[facility]-EMERGENCY DEPT Provider Note   CSN: [id number] Arrival date & time: 07/08/16  1700     History   Chief Complaint Chief Complaint  Patient presents with  . Abdominal Pain    HPI [name] is a 82 y.o. female.  82 year old female with history of hiatal hernia as well as abdominal wall hernia presents complaining of abdominal wall distention in her epigastric area consistent with her abdominal wall hernia. Has had nausea but no vomiting. No cardiac symptoms. No dark or bloody stools. Was able to self reduce her hernia. Continues to have discomfort that radiates to her back and characterized as a pressure. She attempted to eat today but was not successful. Nothing makes her symptoms better.      Past Medical History:  Diagnosis Date  . Anemia    a. "On/off my whole life" - etiology unclear. Has never required blood transfusion.  . Anemia associated with stage 3 chronic renal failure 06/02/2016  . Arthritis   . CAD (coronary artery disease)   . Carotid artery occlusion    RICA 80-99%, LICA 40-59% by dopplers 06/2013. Also with cerebrovascular disease otherwise on MRA 06/2013.  . Carotid artery stenosis   . Chronic kidney disease   . Chronic renal insufficiency, stage 3 (moderate) 06/02/2016  . Depression   . Dizziness    a. Adm 06/2013: felt due to posterior circulation deficits from vertebrobasilar insufficiency and possibly vertigo.  Marland Kitchen. DVT (deep venous thrombosis) ([facility])    a. Around age 82, details unclear (SVT vs DVT?), s/p vein stripping without recurrence.  . Erythropoietin deficiency anemia 06/02/2016  . Heart murmur    a. Longstanding. mild MR on prior ECG, also may be in setting of known vasc dz.  . Hiatal hernia   . Hyperlipidemia   . Hypertension    a. Intolerant to Maxzide due to hyponatremia. b. intolerant to Clonidine due to slow HR/fatigue. c. Discrepancy in BP felt due to innominant stenosis.  . Macular degeneration    In both eyes - no longer  drives.  . Macular degeneration    L- macular degeneration, R eye impaired as well  . Multiple thyroid nodules    a. By [place] 06/2013. Bx recommended.  . Pernicious anemia 06/02/2016  . PFO (patent foramen ovale)    a. Per echo in 12/2010.  Marland Kitchen. PVD (peripheral vascular disease) ([facility])    a. R innominant stenosis. b. 06/2013 ABI: 0.80 R, 0.50 L., DVT- 1970's  . Varicose veins     Patient Active Problem List   Diagnosis Date Noted  . Erythropoietin deficiency anemia 06/02/2016  . Pernicious anemia 06/02/2016  . Chronic renal insufficiency, stage 3 (moderate) 06/02/2016  . Carotid artery stenosis 10/31/2015  . Carotid stenosis 06/11/2014  . PVD (peripheral vascular disease) with claudication ([facility]) 06/11/2014  . Varicose veins of lower extremities with other complications 02/16/2014  . Neck pain-Posterior 11/30/2013  . Osteopenia, senile 11/22/2013  . Heart palpitations 07/14/2013  . Burning sensation of left arm 07/14/2013  . Arm pain 07/14/2013  . Dizziness 09/12/2012  . Murmur 12/16/2010  . Hyperlipidemia LDL goal <100 07/17/2009  . Essential hypertension 07/17/2009  . CAROTID STENOSIS 06/21/2009    Past Surgical History:  Procedure Laterality Date  . ABDOMINAL HYSTERECTOMY  1971  . APPENDECTOMY    . BLADDER SURGERY  2004   baldder sling  . CATARACT EXTRACTION    . CESAREAN SECTION     X1  . ENDARTERECTOMY Right 10/31/2015   Procedure:  RIGHT CAROTID ENDARTERECTOMY;  Surgeon: Nada Libman, MD;  Location: [facility] OR;  Service: Vascular;  Laterality: Right;  . EYE SURGERY     cataracts removed, /w IOL  . INCONTINENCE SURGERY    . LUMBAR DISC SURGERY    . Neck Muscle Surgery    . SPINE SURGERY    . TOE SURGERY Right    great toe with pins  . VARICOSE VEIN SURGERY     left leg    OB History    No data available       Home Medications    Prior to Admission medications   Medication Sig Start Date End Date Taking? Authorizing Provider  amLODipine (NORVASC) 5 MG tablet  TAKE ONE TABLET BY MOUTH TWICE DAILY 02/03/16   Pricilla Riffle, MD  aspirin 81 MG tablet Take 81 mg by mouth daily.      Historical Provider, MD  atorvastatin (LIPITOR) 20 MG tablet TAKE 1 TABLET (20 MG TOTAL) BY MOUTH DAILY. 05/01/16   Pricilla Riffle, MD  Calcium Citrate (CALCITRATE PO) Take 1 tablet by mouth daily.     Historical Provider, MD  carvedilol (COREG) 3.125 MG tablet TAKE 1 TABLET (3.125 MG TOTAL) BY MOUTH DAILY. 05/15/16   Pricilla Riffle, MD  cholecalciferol (VITAMIN D) 1000 UNITS tablet Take 1,000 Units by mouth daily.    Historical Provider, MD  clopidogrel (PLAVIX) 75 MG tablet TAKE ONE TABLET BY MOUTH DAILY WITH BREAKFAST 02/18/16   Pricilla Riffle, MD  ferrous sulfate 325 (65 FE) MG tablet TAKE ONE TABLET BY MOUTH ONE TIME DAILY Patient taking differently: TAKE TWO TABLET BY MOUTH three times daily 05/15/16   Pricilla Riffle, MD  hydrALAZINE (APRESOLINE) 25 MG tablet Take 2 tablets (50 mg total) by mouth 3 (three) times daily. 10/07/15   Pricilla Riffle, MD  ibuprofen (ADVIL,MOTRIN) 200 MG tablet Take 200-400 mg by mouth daily as needed for mild pain or moderate pain.     Historical Provider, MD  lisinopril (PRINIVIL,ZESTRIL) 40 MG tablet TAKE ONE TABLET BY MOUTH DAILY 02/18/16   Pricilla Riffle, MD  Multiple Vitamins-Minerals (PRESERVISION AREDS) CAPS Take 1 capsule by mouth daily.     Historical Provider, MD    Family History Family History  Problem Relation Age of Onset  . Cancer Mother     [name]  . Diabetes Sister   . Cancer Sister   . Hyperlipidemia Sister   . Heart attack Sister   . Hypertension Sister   . Cancer Sister     Breast  . Hyperlipidemia Sister   . Diabetes Son   . Hyperlipidemia Daughter   . Hypertension Daughter     Social History Social History  Substance Use Topics  . Smoking status: Former Smoker    Quit date: 08/24/2009  . Smokeless tobacco: Never Used  . Alcohol use No     Allergies   Penicillins; Clonidine derivatives; and Maxzide [hydrochlorothiazide  w-triamterene]   Review of Systems Review of Systems  All other systems reviewed and are negative.    Physical Exam Updated Vital Signs There were no vitals taken for this visit.  Physical Exam  Constitutional: She is oriented to person, place, and time. She appears well-developed and well-nourished.  Non-toxic appearance. No distress.  HENT:  Head: Normocephalic and atraumatic.  Eyes: Conjunctivae, EOM and lids are normal. Pupils are equal, round, and reactive to light.  Neck: Normal range of motion. Neck supple. No tracheal deviation present. No  thyroid mass present.  Cardiovascular: Normal rate, regular rhythm and normal heart sounds.  Exam reveals no gallop.   No murmur heard. Pulmonary/Chest: Effort normal and breath sounds normal. No stridor. No respiratory distress. She has no decreased breath sounds. She has no wheezes. She has no rhonchi. She has no rales.  Abdominal: Soft. Normal appearance and bowel sounds are normal. She exhibits no distension. There is tenderness in the epigastric area. There is no rebound and no CVA tenderness.    Musculoskeletal: Normal range of motion. She exhibits no edema or tenderness.  Neurological: She is alert and oriented to person, place, and time. She has normal strength. No cranial nerve deficit or sensory deficit. GCS eye subscore is 4. GCS verbal subscore is 5. GCS motor subscore is 6.  Skin: Skin is warm and dry. No abrasion and no rash noted.  Psychiatric: She has a normal mood and affect. Her speech is normal and behavior is normal.  Nursing note and vitals reviewed.    ED Treatments / Results  Labs (all labs ordered are listed, but only abnormal results are displayed) Labs Reviewed  URINE CULTURE  CBC WITH DIFFERENTIAL/PLATELET  COMPREHENSIVE METABOLIC PANEL  LIPASE, BLOOD  TROPONIN I  URINALYSIS, ROUTINE W REFLEX MICROSCOPIC (NOT [facility])    EKG  EKG Interpretation  Date/Time:  Wednesday July 08 2016 17:11:02  EST Ventricular Rate:  77 PR Interval:    QRS Duration: 107 QT Interval:  373 QTC Calculation: 423 R Axis:   -10 Text Interpretation:  Sinus rhythm Abnormal R-wave progression, early transition Repol abnrm, prob ischemia, anterolateral lds t-wave inversions new from prior Confirmed by Nahun Kronberg  MD, Nile Dorning ([id number]) on 07/08/2016 5:31:02 PM       Radiology No results found.  Procedures Procedures (including critical care time)  Medications Ordered in ED Medications  0.9 %  sodium chloride infusion (not administered)     Initial Impression / Assessment and Plan / ED Course  I have reviewed the triage vital signs and the nursing notes.  Pertinent labs & imaging results that were available during my care of the patient were reviewed by me and considered in my medical decision making (see chart for details).  Clinical Course     Patient has no signs of cough congestion or shortness of breath. Patient medicated for pain and now fills better. She has pinpoint tenderness at her xiphoid and suspect this is the cause of her pain. Abdominal CT results reviewed and patient has no clinical signs pneumonia. Will tell patient to follow-up with her doctor concerning for a small effusion. Patient's renal insufficiency is somewhat worse when compared to prior. Patient's hemoglobin is baseline. We'll patient also follow with her Dr. for this. Final Clinical Impressions(s) / ED Diagnoses   Final diagnoses:  None    New Prescriptions New Prescriptions   No medications on file     Lorre NickAnthony Cutler Sunday, MD 07/08/16 2240

## 2016-07-08 NOTE — ED Notes (Signed)
ED Provider at bedside. 

## 2016-07-08 NOTE — ED Triage Notes (Signed)
Pt brought to ED by EMS for acute abdominal pain. Pt states she has a hx of a hiatal hernia. Pt states she felt it bulging so she applied direct pressure to the site to relieve the associated pain. [facility] the incident occurred. Pt's MD suggested she be seen at the ED for tx. Pt is c/o pain just below the ribcage. VSS upon arrival.

## 2016-07-08 NOTE — Discharge Instructions (Signed)
Return here at once for fever, vomiting, worsening pain, or any other problems. You do have a small amount of fluid at the base of the left lung in your doctor needs to follow-up on this. Your kidney function is also slightly decreased from prior and your doctor needs to follow-up on this as well

## 2016-07-09 ENCOUNTER — Other Ambulatory Visit: Payer: Medicare Other

## 2016-07-09 ENCOUNTER — Ambulatory Visit: Payer: Medicare Other

## 2016-07-09 ENCOUNTER — Encounter: Payer: Medicare Other | Admitting: Hematology & Oncology

## 2016-07-10 LAB — URINE CULTURE

## 2016-07-22 NOTE — Progress Notes (Signed)
This encounter was created in error - please disregard.

## 2016-07-23 NOTE — Addendum Note (Signed)
Addended by: Burton ApleyPETTY, Tichina Koebel A on: 07/23/2016 12:59 PM   Modules accepted: Orders

## 2016-08-09 ENCOUNTER — Other Ambulatory Visit: Payer: Self-pay | Admitting: Internal Medicine

## 2016-08-18 ENCOUNTER — Encounter: Payer: Self-pay | Admitting: *Deleted

## 2016-08-18 ENCOUNTER — Other Ambulatory Visit: Payer: Self-pay | Admitting: Family

## 2016-08-18 DIAGNOSIS — N189 Chronic kidney disease, unspecified: Secondary | ICD-10-CM

## 2016-08-18 DIAGNOSIS — D631 Anemia in chronic kidney disease: Secondary | ICD-10-CM | POA: Insufficient documentation

## 2016-08-18 DIAGNOSIS — N183 Chronic kidney disease, stage 3 unspecified: Secondary | ICD-10-CM

## 2016-08-26 ENCOUNTER — Ambulatory Visit (HOSPITAL_BASED_OUTPATIENT_CLINIC_OR_DEPARTMENT_OTHER): Payer: Medicare Other

## 2016-08-26 ENCOUNTER — Ambulatory Visit (HOSPITAL_BASED_OUTPATIENT_CLINIC_OR_DEPARTMENT_OTHER): Payer: Medicare Other | Admitting: Family

## 2016-08-26 ENCOUNTER — Other Ambulatory Visit (HOSPITAL_BASED_OUTPATIENT_CLINIC_OR_DEPARTMENT_OTHER): Payer: Medicare Other

## 2016-08-26 VITALS — BP 168/61 | HR 70 | Temp 97.5°F | Resp 20 | Wt 129.1 lb

## 2016-08-26 DIAGNOSIS — D510 Vitamin B12 deficiency anemia due to intrinsic factor deficiency: Secondary | ICD-10-CM

## 2016-08-26 DIAGNOSIS — N183 Chronic kidney disease, stage 3 unspecified: Secondary | ICD-10-CM

## 2016-08-26 DIAGNOSIS — D508 Other iron deficiency anemias: Secondary | ICD-10-CM | POA: Diagnosis not present

## 2016-08-26 DIAGNOSIS — D631 Anemia in chronic kidney disease: Secondary | ICD-10-CM

## 2016-08-26 LAB — IRON AND TIBC
%SAT: 23 % (ref 21–57)
Iron: 53 ug/dL (ref 41–142)
TIBC: 235 ug/dL — ABNORMAL LOW (ref 236–444)
UIBC: 182 ug/dL (ref 120–384)

## 2016-08-26 LAB — CBC WITH DIFFERENTIAL (CANCER CENTER ONLY)
BASO#: 0.1 10*3/uL (ref 0.0–0.2)
BASO%: 1.5 % (ref 0.0–2.0)
EOS ABS: 0.2 10*3/uL (ref 0.0–0.5)
EOS%: 2.3 % (ref 0.0–7.0)
HCT: 23.4 % — ABNORMAL LOW (ref 34.8–46.6)
HGB: 7.8 g/dL — ABNORMAL LOW (ref 11.6–15.9)
LYMPH#: 0.8 10*3/uL — ABNORMAL LOW (ref 0.9–3.3)
LYMPH%: 10.4 % — AB (ref 14.0–48.0)
MCH: 29.7 pg (ref 26.0–34.0)
MCHC: 33.3 g/dL (ref 32.0–36.0)
MCV: 89 fL (ref 81–101)
MONO#: 0.7 10*3/uL (ref 0.1–0.9)
MONO%: 8.6 % (ref 0.0–13.0)
NEUT#: 6.2 10*3/uL (ref 1.5–6.5)
NEUT%: 77.2 % (ref 39.6–80.0)
PLATELETS: 260 10*3/uL (ref 145–400)
RBC: 2.63 10*6/uL — ABNORMAL LOW (ref 3.70–5.32)
RDW: 14 % (ref 11.1–15.7)
WBC: 8 10*3/uL (ref 3.9–10.0)

## 2016-08-26 LAB — COMPREHENSIVE METABOLIC PANEL
ALBUMIN: 3 g/dL — AB (ref 3.5–5.0)
ALK PHOS: 72 U/L (ref 40–150)
ALT: 9 U/L (ref 0–55)
AST: 17 U/L (ref 5–34)
Anion Gap: 9 mEq/L (ref 3–11)
BUN: 24 mg/dL (ref 7.0–26.0)
CALCIUM: 8.5 mg/dL (ref 8.4–10.4)
CHLORIDE: 110 meq/L — AB (ref 98–109)
CO2: 20 mEq/L — ABNORMAL LOW (ref 22–29)
CREATININE: 1.4 mg/dL — AB (ref 0.6–1.1)
EGFR: 36 mL/min/{1.73_m2} — ABNORMAL LOW (ref >90)
Glucose: 100 mg/dl (ref 70–140)
Potassium: 4.6 mEq/L (ref 3.5–5.1)
Sodium: 139 mEq/L (ref 136–145)
TOTAL PROTEIN: 5.5 g/dL — AB (ref 6.4–8.3)
Total Bilirubin: 0.46 mg/dL (ref 0.20–1.20)

## 2016-08-26 LAB — FERRITIN: Ferritin: 131 ng/ml (ref 9–269)

## 2016-08-26 MED ORDER — DARBEPOETIN ALFA 200 MCG/0.4ML IJ SOSY
PREFILLED_SYRINGE | INTRAMUSCULAR | Status: AC
  Filled 2016-08-26: qty 0.4

## 2016-08-26 MED ORDER — DARBEPOETIN ALFA 200 MCG/0.4ML IJ SOSY
200.0000 ug | PREFILLED_SYRINGE | Freq: Once | INTRAMUSCULAR | Status: AC
  Administered 2016-08-26: 200 ug via SUBCUTANEOUS

## 2016-08-26 NOTE — Patient Instructions (Signed)

## 2016-08-26 NOTE — Progress Notes (Signed)
Hematology and Oncology Follow Up Visit  [name] [id number] [birth year] 83 y.o. 08/26/2016   Principle Diagnosis:  Anemia secondary to chronic renal insufficiency and erythropoietin deficiency  Pernicious anemia   Current Therapy:   Aranesp 200 mcg SQ to keep Hgb > 10 To start B 12 injections if needed at next visit    Interim History:  [name] is here today with her daughter for follow-up and Aranesp. Unfortunately, she had to cancel several appointments due to illness and has not yet received Aranesp or B 12. She is symptomatic with fatigue, mild SOB with exertion and chills.  Her erythropoietin was 10 and B 12 206 at her visit in October. Iron studies were stable.  Hgb is now 7.8 with an MCV of 89. She is symptomatic with fatigue, chills and some mild SOB with exertion.  She has generalized arthritic aches and pains. No swelling, numbness or tingling in her extremities at this time.  No fever, n/v, cough, rash, dizziness, chest pain, palpitations, abdominal pain or changes in bowel or bladder habits.  Her appetite is improving and she states that her daughters are making sure she stays hydrated. Her weight is stable.   Medications:  Allergies as of 08/26/2016      Reactions   Penicillins Swelling, Rash   Has patient had a PCN reaction causing immediate rash, facial/tongue/throat swelling, SOB or lightheadedness with hypotension: Unknown Has patient had a PCN reaction causing severe rash involving mucus membranes or skin necrosis: No Has patient had a PCN reaction that required hospitalization No Has patient had a PCN reaction occurring within the last 10 years: No If all of the above answers are "NO", then may proceed with Cephalosporin use.   Clonidine Derivatives Other (See Comments)   Fatigue Bradycardia   Maxzide [hydrochlorothiazide W-triamterene] Other (See Comments)   hyponatremia      Medication List       Accurate as of 08/26/16 10:22 AM. Always use your  most recent med list.          amLODipine 5 MG tablet Commonly known as:  NORVASC TAKE ONE TABLET BY MOUTH TWICE DAILY   aspirin 81 MG tablet Take 81 mg by mouth daily.   atorvastatin 20 MG tablet Commonly known as:  LIPITOR TAKE 1 TABLET (20 MG TOTAL) BY MOUTH DAILY.   CALCIUM PO Take 1 tablet by mouth daily.   carvedilol 3.125 MG tablet Commonly known as:  COREG TAKE 1 TABLET (3.125 MG TOTAL) BY MOUTH DAILY.   cholecalciferol 1000 units tablet Commonly known as:  VITAMIN D Take 1,000 Units by mouth daily.   clopidogrel 75 MG tablet Commonly known as:  PLAVIX TAKE ONE TABLET BY MOUTH DAILY WITH BREAKFAST   fluticasone 50 MCG/ACT nasal spray Commonly known as:  FLONASE Place 1 spray into both nostrils daily.   hydrALAZINE 25 MG tablet Commonly known as:  APRESOLINE Take 2 tablets (50 mg total) by mouth 3 (three) times daily.   lisinopril 40 MG tablet Commonly known as:  PRINIVIL,ZESTRIL TAKE ONE TABLET BY MOUTH DAILY   PRESERVISION AREDS Caps Take 1 capsule by mouth daily.       Allergies:  Allergies  Allergen Reactions  . Penicillins Swelling and Rash    Has patient had a PCN reaction causing immediate rash, facial/tongue/throat swelling, SOB or lightheadedness with hypotension: Unknown Has patient had a PCN reaction causing severe rash involving mucus membranes or skin necrosis: No Has patient had a PCN reaction that required  hospitalization No Has patient had a PCN reaction occurring within the last 10 years: No If all of the above answers are "NO", then may proceed with Cephalosporin use.   . Clonidine Derivatives Other (See Comments)    Fatigue Bradycardia   . Maxzide [Hydrochlorothiazide W-Triamterene] Other (See Comments)    hyponatremia    Past Medical History, Surgical history, Social history, and Family History were reviewed and updated.  Review of Systems: All other 10 point review of systems is negative.   Physical Exam:  vitals  were not taken for this visit.  Wt Readings from Last 3 Encounters:  06/01/16 130 lb 12.8 oz (59.3 kg)  05/18/16 129 lb 14.4 oz (58.9 kg)  12/12/15 127 lb (57.6 kg)    Ocular: Sclerae unicteric, pupils equal, round and reactive to light Ear-nose-throat: Oropharynx clear, dentition fair Lymphatic: No cervical supraclavicular or axillary adenopathy Lungs no rales or rhonchi, good excursion bilaterally Heart regular rate and rhythm, no murmur appreciated Abd soft, nontender, positive bowel sounds, no liver or spleen tip palpated on exam MSK no focal spinal tenderness, no joint edema Neuro: non-focal, well-oriented, appropriate affect Breasts: None  Lab Results  Component Value Date   WBC 8.0 08/26/2016   HGB 7.8 (L) 08/26/2016   HCT 23.4 (L) 08/26/2016   MCV 89 08/26/2016   PLT 260 08/26/2016   Lab Results  Component Value Date   FERRITIN 107 06/01/2016   IRON 68 06/01/2016   TIBC 256 06/01/2016   UIBC 188 06/01/2016   IRONPCTSAT 27 06/01/2016   Lab Results  Component Value Date   RETICCTPCT 1.3 05/11/2014   RBC 2.63 (L) 08/26/2016   RETICCTABS 44.6 05/11/2014   No results found for: KPAFRELGTCHN, LAMBDASER, KAPLAMBRATIO No results found for: Loel LoftyGGSERUM, IGA, [facility] Lab Results  Component Value Date   [facility] Not Observed 06/01/2016     Chemistry      Component Value Date/Time   NA 138 07/08/2016 1750   NA 136 06/01/2016 1537   K 4.7 07/08/2016 1750   K 4.7 06/01/2016 1537   CL 111 07/08/2016 1750   CL 106 06/01/2016 1537   CO2 20 (L) 07/08/2016 1750   CO2 22 06/01/2016 1537   BUN 20 07/08/2016 1750   BUN 20 06/01/2016 1537   CREATININE 1.58 (H) 07/08/2016 1750   CREATININE 1.49 (H) 06/01/2016 1537   CREATININE 1.31 (H) 12/12/2015 1449      Component Value Date/Time   CALCIUM 8.7 (L) 07/08/2016 1750   CALCIUM 8.6 (L) 06/01/2016 1537   ALKPHOS 59 07/08/2016 1750   ALKPHOS 62 06/01/2016 1537   AST 22 07/08/2016 1750   AST 17 06/01/2016 1537   ALT 13 (L)  07/08/2016 1750   ALT 10 06/01/2016 1537   BILITOT 0.6 07/08/2016 1750   BILITOT 0.2 06/01/2016 1537     Impression and Plan: [name] is an 83 yo white female multiple medical including chronic renal insufficiency and anemia. Her erythropoietin B 12 levels were both low at her last appointment. Unfortunately, she has not been able to follow-up with our office and receive her Aranesp until now.  Hgb is 7.8 with an MCV of 89. Her BP is 168/61 and she verbalized that she did take her antihypertensive medications this morning as prescribed.  We will give her a reduced dose of Aranesp 200 mcg SQ today since her BP is a little up.  We will see what her B 12 level is today and get her set up with  an injection if needed.  We will go ahead and plan to see her back in 2 weeks for repeat lab work and follow-up.  Both she and her daughters know to contact our office with any questions or concerns. We can certainly see her sooner if need be.   Verdie Mosher, NP 1/3/201810:22 AM

## 2016-08-27 ENCOUNTER — Encounter: Payer: Self-pay | Admitting: Hematology & Oncology

## 2016-08-27 LAB — RETICULOCYTES: RETICULOCYTE COUNT: 1.7 % (ref 0.6–2.6)

## 2016-08-27 LAB — VITAMIN B12: Vitamin B12: 246 pg/mL (ref 232–1245)

## 2016-09-04 ENCOUNTER — Encounter: Payer: Self-pay | Admitting: Internal Medicine

## 2016-09-04 ENCOUNTER — Ambulatory Visit (INDEPENDENT_AMBULATORY_CARE_PROVIDER_SITE_OTHER): Payer: Medicare Other | Admitting: Internal Medicine

## 2016-09-04 VITALS — BP 130/60 | HR 69 | Ht 67.0 in | Wt 130.8 lb

## 2016-09-04 DIAGNOSIS — I739 Peripheral vascular disease, unspecified: Secondary | ICD-10-CM

## 2016-09-04 DIAGNOSIS — I10 Essential (primary) hypertension: Secondary | ICD-10-CM | POA: Diagnosis not present

## 2016-09-04 DIAGNOSIS — E785 Hyperlipidemia, unspecified: Secondary | ICD-10-CM | POA: Diagnosis not present

## 2016-09-04 DIAGNOSIS — I779 Disorder of arteries and arterioles, unspecified: Secondary | ICD-10-CM

## 2016-09-04 NOTE — Progress Notes (Signed)
Cardiology Office Note   Date:  09/04/2016   ID:  [name], DOB 20-Jun-1931, MRN [id number]  PCP:  Duane Lope, MD  Cardiologist:   Dietrich Pates, MD    F/U of HTN and HL     History of Present Illness: [name] is a 83 y.o. female with a history of HTN and CV dz  I saw her in April 2017 SInce seen she is followed in heme clinic    She denies CP  Breathing is fair  She has had upper rsp issues now for weeks  NO Fevers  Always cold  Followed in IM  No dizziness or syncope      Current Meds  Medication Sig  . amLODipine (NORVASC) 5 MG tablet TAKE ONE TABLET BY MOUTH TWICE DAILY  . aspirin 81 MG tablet Take 81 mg by mouth daily.    Marland Kitchen atorvastatin (LIPITOR) 20 MG tablet TAKE 1 TABLET (20 MG TOTAL) BY MOUTH DAILY.  Marland Kitchen CALCIUM PO Take 1 tablet by mouth daily.  . carvedilol (COREG) 3.125 MG tablet TAKE 1 TABLET (3.125 MG TOTAL) BY MOUTH DAILY.  . cholecalciferol (VITAMIN D) 1000 UNITS tablet Take 1,000 Units by mouth daily.  . clopidogrel (PLAVIX) 75 MG tablet TAKE ONE TABLET BY MOUTH DAILY WITH BREAKFAST  . fluticasone (FLONASE) 50 MCG/ACT nasal spray Place 1 spray into both nostrils daily.  . hydrALAZINE (APRESOLINE) 25 MG tablet Take 2 tablets (50 mg total) by mouth 3 (three) times daily.  Marland Kitchen lisinopril (PRINIVIL,ZESTRIL) 40 MG tablet TAKE ONE TABLET BY MOUTH DAILY  . Multiple Vitamins-Minerals (PRESERVISION AREDS) CAPS Take 1 capsule by mouth daily.      Allergies:   Penicillins; Clonidine derivatives; and Maxzide [hydrochlorothiazide w-triamterene]   Past Medical History:  Diagnosis Date  . Anemia    a. "On/off my whole life" - etiology unclear. Has never required blood transfusion.  . Anemia associated with stage 3 chronic renal failure 06/02/2016  . Arthritis   . CAD (coronary artery disease)   . Carotid artery occlusion    RICA 80-99%, LICA 40-59% by dopplers 06/2013. Also with cerebrovascular disease otherwise on MRA 06/2013.  . Carotid artery stenosis   .  Chronic kidney disease   . Chronic renal insufficiency, stage 3 (moderate) 06/02/2016  . Depression   . Dizziness    a. Adm 06/2013: felt due to posterior circulation deficits from vertebrobasilar insufficiency and possibly vertigo.  Marland Kitchen DVT (deep venous thrombosis) (HCC)    a. Around age 73, details unclear (SVT vs DVT?), s/p vein stripping without recurrence.  . Erythropoietin deficiency anemia 06/02/2016  . Heart murmur    a. Longstanding. mild MR on prior ECG, also may be in setting of known vasc dz.  . Hiatal hernia   . Hyperlipidemia   . Hypertension    a. Intolerant to Maxzide due to hyponatremia. b. intolerant to Clonidine due to slow HR/fatigue. c. Discrepancy in BP felt due to innominant stenosis.  . Macular degeneration    In both eyes - no longer drives.  . Macular degeneration    L- macular degeneration, R eye impaired as well  . Multiple thyroid nodules    a. By [place] 06/2013. Bx recommended.  . Pernicious anemia 06/02/2016  . PFO (patent foramen ovale)    a. Per echo in 12/2010.  Marland Kitchen PVD (peripheral vascular disease) (HCC)    a. R innominant stenosis. b. 06/2013 ABI: 0.80 R, 0.50 L., DVT- 1970's  . Varicose veins  Past Surgical History:  Procedure Laterality Date  . ABDOMINAL HYSTERECTOMY  1971  . APPENDECTOMY    . BLADDER SURGERY  2004   baldder sling  . CATARACT EXTRACTION    . CESAREAN SECTION     X1  . ENDARTERECTOMY Right 10/31/2015   Procedure: RIGHT CAROTID ENDARTERECTOMY;  Surgeon: Nada Libman, MD;  Location: [facility] OR;  Service: Vascular;  Laterality: Right;  . EYE SURGERY     cataracts removed, /w IOL  . INCONTINENCE SURGERY    . LUMBAR DISC SURGERY    . Neck Muscle Surgery    . SPINE SURGERY    . TOE SURGERY Right    great toe with pins  . VARICOSE VEIN SURGERY     left leg     Social History:  The patient  reports that she quit smoking about 7 years ago. She has never used smokeless tobacco. She reports that she does not drink alcohol or use  drugs.   Family History:  The patient's family history includes Cancer in her mother, sister, and sister; Diabetes in her sister and son; Heart attack in her sister; Hyperlipidemia in her daughter, sister, and sister; Hypertension in her daughter and sister.    ROS:  Please see the history of present illness. All other systems are reviewed and  Negative to the above problem except as noted.    PHYSICAL EXAM: VS:  BP 130/60   Pulse 69   Ht 5\' 7"  (1.702 m)   Wt 130 lb 12.8 oz (59.3 kg)   SpO2 94%   BMI 20.49 kg/m   GEN: Well nourished, well developed, in no acute distress  HEENT: normal  Neck: no JVD, carotid bruits, or masses Cardiac: RRR; Gr II/VI systolic murmur at base  , rubs, or gallops,no edema  Respiratory:  clear to auscultation bilaterally, normal work of breathing GI: soft, nontender, nondistended, + BS  No hepatomegaly  MS: no deformity Moving all extremities   Skin: warm and dry, no rash Neuro:  Strength and sensation are intact Psych: euthymic mood, full affect   EKG:  EKG isnot  ordered today.   Lipid Panel    Component Value Date/Time   CHOL 137 12/12/2015 1449   CHOL 169 02/19/2014 1255   CHOL 133 02/17/2013 0947   TRIG 77 12/12/2015 1449   TRIG CANCELED 02/19/2014 1255   TRIG 65 02/17/2013 0947   HDL 57 12/12/2015 1449   HDL CANCELED 02/19/2014 1255   HDL 48 02/19/2014 1255   HDL 58 02/17/2013 0947   CHOLHDL 2.4 12/12/2015 1449   VLDL 15 12/12/2015 1449   LDLCALC 65 12/12/2015 1449   LDLCALC 97 02/19/2014 1255   LDLCALC 95 02/19/2014 0000   LDLCALC 62 02/17/2013 0947      Wt Readings from Last 3 Encounters:  09/04/16 130 lb 12.8 oz (59.3 kg)  08/26/16 129 lb 1.9 oz (58.6 kg)  06/01/16 130 lb 12.8 oz (59.3 kg)      ASSESSMENT AND PLAN:  1  HTN  Adequate control  2  HL  Keep on statin  3  Anemia  Followed by P Ennever  Hgb 7.3 on last check    4  Murmur  Echo a little over a year ago showed basal septal hypertrophy and turbulent flow  through LV  No AV dz    Promin due to anemia  No AAV dz  5  CV dz  Follows with W Brabham     Current medicines  are reviewed at length with the patient today.  The patient does not have concerns regarding medicines.  Signed, Dietrich PatesPaula Hadasa Gasner, MD  09/04/2016 8:16 AM    [facility] [address], [place], [place]  [address] Phone: [phone]) [phone]; Fax: [phone]

## 2016-09-04 NOTE — Patient Instructions (Signed)
Your physician recommends that you continue on your current medications as directed. Please refer to the Current Medication list given to you today. Your physician wants you to follow-up in: 9 months with Dr. Ross.  You will receive a reminder letter in the mail two months in advance. If you don't receive a letter, please call our office to schedule the follow-up appointment.  

## 2016-09-08 ENCOUNTER — Telehealth: Payer: Self-pay | Admitting: *Deleted

## 2016-09-08 ENCOUNTER — Other Ambulatory Visit (HOSPITAL_BASED_OUTPATIENT_CLINIC_OR_DEPARTMENT_OTHER): Payer: Medicare Other

## 2016-09-08 ENCOUNTER — Encounter: Payer: Self-pay | Admitting: Hematology & Oncology

## 2016-09-08 ENCOUNTER — Ambulatory Visit (HOSPITAL_BASED_OUTPATIENT_CLINIC_OR_DEPARTMENT_OTHER): Payer: Medicare Other

## 2016-09-08 ENCOUNTER — Ambulatory Visit (HOSPITAL_BASED_OUTPATIENT_CLINIC_OR_DEPARTMENT_OTHER): Payer: Medicare Other | Admitting: Hematology & Oncology

## 2016-09-08 VITALS — BP 138/60 | HR 73 | Temp 97.6°F | Resp 20 | Wt 129.4 lb

## 2016-09-08 DIAGNOSIS — D508 Other iron deficiency anemias: Secondary | ICD-10-CM | POA: Diagnosis not present

## 2016-09-08 DIAGNOSIS — D631 Anemia in chronic kidney disease: Secondary | ICD-10-CM

## 2016-09-08 DIAGNOSIS — D510 Vitamin B12 deficiency anemia due to intrinsic factor deficiency: Secondary | ICD-10-CM

## 2016-09-08 DIAGNOSIS — D500 Iron deficiency anemia secondary to blood loss (chronic): Secondary | ICD-10-CM

## 2016-09-08 DIAGNOSIS — N183 Chronic kidney disease, stage 3 unspecified: Secondary | ICD-10-CM

## 2016-09-08 DIAGNOSIS — K909 Intestinal malabsorption, unspecified: Secondary | ICD-10-CM

## 2016-09-08 HISTORY — DX: Iron deficiency anemia secondary to blood loss (chronic): D50.0

## 2016-09-08 HISTORY — DX: Intestinal malabsorption, unspecified: K90.9

## 2016-09-08 LAB — COMPREHENSIVE METABOLIC PANEL
ALK PHOS: 80 U/L (ref 40–150)
ALT: 9 U/L (ref 0–55)
AST: 20 U/L (ref 5–34)
Albumin: 3 g/dL — ABNORMAL LOW (ref 3.5–5.0)
Anion Gap: 7 mEq/L (ref 3–11)
BUN: 18.1 mg/dL (ref 7.0–26.0)
CALCIUM: 8.7 mg/dL (ref 8.4–10.4)
CHLORIDE: 111 meq/L — AB (ref 98–109)
CO2: 22 mEq/L (ref 22–29)
Creatinine: 1.5 mg/dL — ABNORMAL HIGH (ref 0.6–1.1)
EGFR: 32 mL/min/{1.73_m2} — AB (ref >90)
Glucose: 98 mg/dl (ref 70–140)
POTASSIUM: 4.8 meq/L (ref 3.5–5.1)
Sodium: 140 mEq/L (ref 136–145)
Total Bilirubin: 0.51 mg/dL (ref 0.20–1.20)
Total Protein: 5.9 g/dL — ABNORMAL LOW (ref 6.4–8.3)

## 2016-09-08 LAB — CBC WITH DIFFERENTIAL (CANCER CENTER ONLY)
BASO#: 0.1 10*3/uL (ref 0.0–0.2)
BASO%: 1.5 % (ref 0.0–2.0)
EOS ABS: 0.1 10*3/uL (ref 0.0–0.5)
EOS%: 2.4 % (ref 0.0–7.0)
HEMATOCRIT: 29.6 % — AB (ref 34.8–46.6)
HGB: 9.5 g/dL — ABNORMAL LOW (ref 11.6–15.9)
LYMPH#: 0.7 10*3/uL — AB (ref 0.9–3.3)
LYMPH%: 11.7 % — ABNORMAL LOW (ref 14.0–48.0)
MCH: 29.5 pg (ref 26.0–34.0)
MCHC: 32.1 g/dL (ref 32.0–36.0)
MCV: 92 fL (ref 81–101)
MONO#: 0.6 10*3/uL (ref 0.1–0.9)
MONO%: 9.7 % (ref 0.0–13.0)
NEUT%: 74.7 % (ref 39.6–80.0)
NEUTROS ABS: 4.4 10*3/uL (ref 1.5–6.5)
Platelets: 249 10*3/uL (ref 145–400)
RBC: 3.22 10*6/uL — ABNORMAL LOW (ref 3.70–5.32)
RDW: 15.3 % (ref 11.1–15.7)
WBC: 5.9 10*3/uL (ref 3.9–10.0)

## 2016-09-08 LAB — IRON AND TIBC
%SAT: 26 % (ref 21–57)
IRON: 65 ug/dL (ref 41–142)
TIBC: 248 ug/dL (ref 236–444)
UIBC: 183 ug/dL (ref 120–384)

## 2016-09-08 LAB — FERRITIN: FERRITIN: 64 ng/mL (ref 9–269)

## 2016-09-08 MED ORDER — DARBEPOETIN ALFA 300 MCG/0.6ML IJ SOSY
300.0000 ug | PREFILLED_SYRINGE | Freq: Once | INTRAMUSCULAR | Status: AC
  Administered 2016-09-08: 300 ug via SUBCUTANEOUS

## 2016-09-08 MED ORDER — DARBEPOETIN ALFA 300 MCG/0.6ML IJ SOSY
PREFILLED_SYRINGE | INTRAMUSCULAR | Status: AC
  Filled 2016-09-08: qty 0.6

## 2016-09-08 NOTE — Telephone Encounter (Addendum)
Patient is aware of results  ----- Message from Verdie MosherSarah M [place], NP sent at 09/08/2016  2:17 PM EST ----- Regarding: Iron  Iron look good no infusion needed at this time. Thank you!  Sarah  ----- Message ----- From: Interface, Lab In Three Zero One Sent: 09/08/2016  11:15 AM To: Verdie MosherSarah M [place], NP

## 2016-09-08 NOTE — Progress Notes (Signed)
Hematology and Oncology Follow Up Visit  Jessica Moralesancy E Quayle [id number] [birth year] 83 y.o. 09/08/2016   Principle Diagnosis:  Anemia secondary to chronic renal insufficiency and erythropoietin deficiency  Iron def anemia Pernicious anemia   Current Therapy:   Aranesp 300 mcg SQ  q 3 weeksto keep Hgb > 10 To start B 12 injections if needed at next visit IV iron as needed    Interim History:  [name] is here today with her daughter for follow-up . She is starting to feel better. We gave her a dose of Aranesp 2 weeks ago. This is already worked. Her hemoglobin has come up to 9.5 from 7.8. I'm very impressed with this. Aranesp should work as her endogenous erythropoietin level is only 10.   I told she and her daughter that we also to watch out for low iron. When she was last seen in early January, her ferritin was 131 but her iron saturation was only 23%.  She's had no obvious bleeding. Her appetite is doing a little bit better. She watches what she eats.   She's had no cough. She's had no fever. She's had some chronic swelling in the left ankle.   Of note only lasted for B-12 level it was 242. She does not qualify for vitamin B-12 injections. I told she had her daughter to take 5000 g daily.  Overall, her performance status is ECOG 2.   Medications:  Allergies as of 09/08/2016      Reactions   Penicillins Swelling, Rash   Has patient had a PCN reaction causing immediate rash, facial/tongue/throat swelling, SOB or lightheadedness with hypotension: Unknown Has patient had a PCN reaction causing severe rash involving mucus membranes or skin necrosis: No Has patient had a PCN reaction that required hospitalization No Has patient had a PCN reaction occurring within the last 10 years: No If all of the above answers are "NO", then may proceed with Cephalosporin use.   Clonidine Derivatives Other (See Comments)   Fatigue Bradycardia   Maxzide [hydrochlorothiazide W-triamterene] Other  (See Comments)   hyponatremia      Medication List       Accurate as of 09/08/16 12:33 PM. Always use your most recent med list.          amLODipine 5 MG tablet Commonly known as:  NORVASC TAKE ONE TABLET BY MOUTH TWICE DAILY   aspirin 81 MG tablet Take 81 mg by mouth daily.   atorvastatin 20 MG tablet Commonly known as:  LIPITOR TAKE 1 TABLET (20 MG TOTAL) BY MOUTH DAILY.   CALCIUM PO Take 1 tablet by mouth daily.   carvedilol 3.125 MG tablet Commonly known as:  COREG TAKE 1 TABLET (3.125 MG TOTAL) BY MOUTH DAILY.   cholecalciferol 1000 units tablet Commonly known as:  VITAMIN D Take 1,000 Units by mouth daily.   clopidogrel 75 MG tablet Commonly known as:  PLAVIX TAKE ONE TABLET BY MOUTH DAILY WITH BREAKFAST   fluticasone 50 MCG/ACT nasal spray Commonly known as:  FLONASE Place 1 spray into both nostrils daily.   hydrALAZINE 25 MG tablet Commonly known as:  APRESOLINE Take 2 tablets (50 mg total) by mouth 3 (three) times daily.   lisinopril 40 MG tablet Commonly known as:  PRINIVIL,ZESTRIL TAKE ONE TABLET BY MOUTH DAILY   PRESERVISION AREDS Caps Take 1 capsule by mouth daily.       Allergies:  Allergies  Allergen Reactions  . Penicillins Swelling and Rash    Has patient had  a PCN reaction causing immediate rash, facial/tongue/throat swelling, SOB or lightheadedness with hypotension: Unknown Has patient had a PCN reaction causing severe rash involving mucus membranes or skin necrosis: No Has patient had a PCN reaction that required hospitalization No Has patient had a PCN reaction occurring within the last 10 years: No If all of the above answers are "NO", then may proceed with Cephalosporin use.   . Clonidine Derivatives Other (See Comments)    Fatigue Bradycardia   . Maxzide [Hydrochlorothiazide W-Triamterene] Other (See Comments)    hyponatremia    Past Medical History, Surgical history, Social history, and Family History were reviewed and  updated.  Review of Systems: All other 10 point review of systems is negative.   Physical Exam:  weight is 129 lb 6.4 oz (58.7 kg). Her oral temperature is 97.6 F (36.4 C). Her blood pressure is 138/60 and her pulse is 73. Her respiration is 20.   Wt Readings from Last 3 Encounters:  09/08/16 129 lb 6.4 oz (58.7 kg)  09/04/16 130 lb 12.8 oz (59.3 kg)  08/26/16 129 lb 1.9 oz (58.6 kg)    Ocular: Sclerae unicteric, pupils equal, round and reactive to light Ear-nose-throat: Oropharynx clear, dentition fair Lymphatic: No cervical supraclavicular or axillary adenopathy Lungs no rales or rhonchi, good excursion bilaterally Heart regular rate and rhythm, no murmur appreciated Abd soft, nontender, positive bowel sounds, no liver or spleen tip palpated on exam MSK no focal spinal tenderness, no joint edema Neuro: non-focal, well-oriented, appropriate affect Breasts: None  Lab Results  Component Value Date   WBC 5.9 09/08/2016   HGB 9.5 (L) 09/08/2016   HCT 29.6 (L) 09/08/2016   MCV 92 09/08/2016   PLT 249 09/08/2016   Lab Results  Component Value Date   FERRITIN 131 08/26/2016   IRON 53 08/26/2016   TIBC 235 (L) 08/26/2016   UIBC 182 08/26/2016   IRONPCTSAT 23 08/26/2016   Lab Results  Component Value Date   RETICCTPCT 1.3 05/11/2014   RBC 3.22 (L) 09/08/2016   RETICCTABS 44.6 05/11/2014   No results found for: KPAFRELGTCHN, LAMBDASER, KAPLAMBRATIO No results found for: Adalberto Ill Lab Results  Component Value Date   [facility] Not Observed 06/01/2016     Chemistry      Component Value Date/Time   NA 139 08/26/2016 1007   K 4.6 08/26/2016 1007   CL 111 07/08/2016 1750   CL 106 06/01/2016 1537   CO2 20 (L) 08/26/2016 1007   BUN 24.0 08/26/2016 1007   CREATININE 1.4 (H) 08/26/2016 1007      Component Value Date/Time   CALCIUM 8.5 08/26/2016 1007   ALKPHOS 72 08/26/2016 1007   AST 17 08/26/2016 1007   ALT 9 08/26/2016 1007   BILITOT 0.46 08/26/2016  1007     Impression and Plan: [name] is an 83 yo white female multiple medical including chronic renal insufficiency and anemia. Her erythropoietin Is quite low. Again, I think she will respond to Aranesp.  I will increase her Aranesp dose up to 300 g. Her blood pressure is much better. I think we can try the 300 g dose.  We will have to see what her iron levels are. We have to make sure that her iron does not get low. If it does, then we will have to give her IV iron.  I will like to get her back in 3 weeks. Hopefully, her hemoglobin will be above 10.  I spent about 25 minutes with she  and her daughter.  Josph Macho, MD 1/16/201812:33 PM

## 2016-09-08 NOTE — Patient Instructions (Signed)

## 2016-09-09 LAB — RETICULOCYTES: RETICULOCYTE COUNT: 3.7 % — AB (ref 0.6–2.6)

## 2016-09-09 LAB — ERYTHROPOIETIN: Erythropoietin: 38.7 m[IU]/mL — ABNORMAL HIGH (ref 2.6–18.5)

## 2016-09-09 LAB — VITAMIN B12: VITAMIN B 12: 277 pg/mL (ref 232–1245)

## 2016-09-28 ENCOUNTER — Other Ambulatory Visit: Payer: Self-pay | Admitting: Family Medicine

## 2016-09-28 DIAGNOSIS — R9389 Abnormal findings on diagnostic imaging of other specified body structures: Secondary | ICD-10-CM

## 2016-09-29 ENCOUNTER — Other Ambulatory Visit (HOSPITAL_BASED_OUTPATIENT_CLINIC_OR_DEPARTMENT_OTHER): Payer: Medicare Other

## 2016-09-29 ENCOUNTER — Ambulatory Visit (HOSPITAL_BASED_OUTPATIENT_CLINIC_OR_DEPARTMENT_OTHER): Payer: Medicare Other | Admitting: Family

## 2016-09-29 VITALS — BP 137/51 | HR 61 | Temp 97.4°F | Resp 18 | Wt 130.0 lb

## 2016-09-29 DIAGNOSIS — D631 Anemia in chronic kidney disease: Secondary | ICD-10-CM

## 2016-09-29 DIAGNOSIS — N183 Chronic kidney disease, stage 3 unspecified: Secondary | ICD-10-CM

## 2016-09-29 DIAGNOSIS — D500 Iron deficiency anemia secondary to blood loss (chronic): Secondary | ICD-10-CM

## 2016-09-29 DIAGNOSIS — D510 Vitamin B12 deficiency anemia due to intrinsic factor deficiency: Secondary | ICD-10-CM | POA: Diagnosis not present

## 2016-09-29 DIAGNOSIS — K909 Intestinal malabsorption, unspecified: Secondary | ICD-10-CM

## 2016-09-29 LAB — IRON AND TIBC
%SAT: 21 % (ref 21–57)
Iron: 51 ug/dL (ref 41–142)
TIBC: 246 ug/dL (ref 236–444)
UIBC: 196 ug/dL (ref 120–384)

## 2016-09-29 LAB — CBC WITH DIFFERENTIAL (CANCER CENTER ONLY)
BASO#: 0.1 10*3/uL (ref 0.0–0.2)
BASO%: 1.8 % (ref 0.0–2.0)
EOS ABS: 0.1 10*3/uL (ref 0.0–0.5)
EOS%: 1.9 % (ref 0.0–7.0)
HCT: 33.1 % — ABNORMAL LOW (ref 34.8–46.6)
HGB: 10.3 g/dL — ABNORMAL LOW (ref 11.6–15.9)
LYMPH#: 0.7 10*3/uL — ABNORMAL LOW (ref 0.9–3.3)
LYMPH%: 11.3 % — AB (ref 14.0–48.0)
MCH: 27.3 pg (ref 26.0–34.0)
MCHC: 31.1 g/dL — AB (ref 32.0–36.0)
MCV: 88 fL (ref 81–101)
MONO#: 0.5 10*3/uL (ref 0.1–0.9)
MONO%: 8.1 % (ref 0.0–13.0)
NEUT#: 4.8 10*3/uL (ref 1.5–6.5)
NEUT%: 76.9 % (ref 39.6–80.0)
PLATELETS: 253 10*3/uL (ref 145–400)
RBC: 3.77 10*6/uL (ref 3.70–5.32)
RDW: 16 % — AB (ref 11.1–15.7)
WBC: 6.3 10*3/uL (ref 3.9–10.0)

## 2016-09-29 LAB — FERRITIN: FERRITIN: 39 ng/mL (ref 9–269)

## 2016-09-29 NOTE — Progress Notes (Signed)
Hematology and Oncology Follow Up Visit  ANALENA GAMA [id number] Dec 26, 1930 83 y.o. 09/29/2016   Principle Diagnosis:  Anemia secondary to chronic renal insufficiency and erythropoietin deficiency  Pernicious anemia  Iron deficiency anemia   Current Therapy:   Aranesp 200 mcg SQ q 3 weeks to keep Hgb > 10 B 12 injections if needed at next visit IV iron as indicated    Interim History:  Ms. Densmore is here today with her daughter for follow-up. She has responded nicely to Aranesp, Hgb is now up to 10.3. Iron studies have been stable so far.  She is receiving home PT at this time and states that this wears her out. She has had some fatigue and muscle soreness.  She has a pressure sore on her left outer ankle that she states is from always sleeping on her left side. She has history of PVD and has some swelling in her left lower extremity. Her daughter states that this has been a long standing issue for her. She is scheduled to have an [place] to rule out DVT on Thursday this week. She wears compression stockings off and on.  She states that on a recent chest xray with her PCP there was a spot in the left lower lobe of her lung. She will also be having a CT to further evaluate on Thursday.  No fever, n/v, cough, rash, dizziness, chest pain, palpitations, abdominal pain or changes in bowel or bladder habits.   No numbness or tingling in her extremities at this time.No new aches or pains  She has maintained a good appetite and is staying well hydrated. Her weight is stable.   Medications:  Allergies as of 09/29/2016      Reactions   Penicillins Swelling, Rash   Has patient had a PCN reaction causing immediate rash, facial/tongue/throat swelling, SOB or lightheadedness with hypotension: Unknown Has patient had a PCN reaction causing severe rash involving mucus membranes or skin necrosis: No Has patient had a PCN reaction that required hospitalization No Has patient had a PCN reaction occurring  within the last 10 years: No If all of the above answers are "NO", then may proceed with Cephalosporin use.   Clonidine Derivatives Other (See Comments)   Fatigue Bradycardia   Maxzide [hydrochlorothiazide W-triamterene] Other (See Comments)   hyponatremia      Medication List       Accurate as of 09/29/16 11:42 AM. Always use your most recent med list.          amLODipine 5 MG tablet Commonly known as:  NORVASC TAKE ONE TABLET BY MOUTH TWICE DAILY   aspirin 81 MG tablet Take 81 mg by mouth daily.   atorvastatin 20 MG tablet Commonly known as:  LIPITOR TAKE 1 TABLET (20 MG TOTAL) BY MOUTH DAILY.   CALCIUM PO Take 1 tablet by mouth daily.   carvedilol 3.125 MG tablet Commonly known as:  COREG TAKE 1 TABLET (3.125 MG TOTAL) BY MOUTH DAILY.   cholecalciferol 1000 units tablet Commonly known as:  VITAMIN D Take 1,000 Units by mouth daily.   clopidogrel 75 MG tablet Commonly known as:  PLAVIX TAKE ONE TABLET BY MOUTH DAILY WITH BREAKFAST   fluticasone 50 MCG/ACT nasal spray Commonly known as:  FLONASE Place 1 spray into both nostrils daily.   hydrALAZINE 25 MG tablet Commonly known as:  APRESOLINE Take 2 tablets (50 mg total) by mouth 3 (three) times daily.   lisinopril 40 MG tablet Commonly known as:  PRINIVIL,ZESTRIL  TAKE ONE TABLET BY MOUTH DAILY   PRESERVISION AREDS Caps Take 1 capsule by mouth daily.       Allergies:  Allergies  Allergen Reactions  . Penicillins Swelling and Rash    Has patient had a PCN reaction causing immediate rash, facial/tongue/throat swelling, SOB or lightheadedness with hypotension: Unknown Has patient had a PCN reaction causing severe rash involving mucus membranes or skin necrosis: No Has patient had a PCN reaction that required hospitalization No Has patient had a PCN reaction occurring within the last 10 years: No If all of the above answers are "NO", then may proceed with Cephalosporin use.   . Clonidine Derivatives  Other (See Comments)    Fatigue Bradycardia   . Maxzide [Hydrochlorothiazide W-Triamterene] Other (See Comments)    hyponatremia    Past Medical History, Surgical history, Social history, and Family History were reviewed and updated.  Review of Systems: All other 10 point review of systems is negative.   Physical Exam:  vitals were not taken for this visit.  Wt Readings from Last 3 Encounters:  09/08/16 129 lb 6.4 oz (58.7 kg)  09/04/16 130 lb 12.8 oz (59.3 kg)  08/26/16 129 lb 1.9 oz (58.6 kg)    Ocular: Sclerae unicteric, pupils equal, round and reactive to light Ear-nose-throat: Oropharynx clear, dentition fair Lymphatic: No cervical supraclavicular or axillary adenopathy Lungs no rales or rhonchi, good excursion bilaterally Heart regular rate and rhythm, no murmur appreciated Abd soft, nontender, positive bowel sounds, no liver or spleen tip palpated on exam MSK no focal spinal tenderness, no joint edema Neuro: non-focal, well-oriented, appropriate affect Breasts: None  Lab Results  Component Value Date   WBC 6.3 09/29/2016   HGB 10.3 (L) 09/29/2016   HCT 33.1 (L) 09/29/2016   MCV 88 09/29/2016   PLT 253 09/29/2016   Lab Results  Component Value Date   FERRITIN 64 09/08/2016   IRON 65 09/08/2016   TIBC 248 09/08/2016   UIBC 183 09/08/2016   IRONPCTSAT 26 09/08/2016   Lab Results  Component Value Date   RETICCTPCT 1.3 05/11/2014   RBC 3.77 09/29/2016   RETICCTABS 44.6 05/11/2014   No results found for: KPAFRELGTCHN, LAMBDASER, KAPLAMBRATIO No results found for: Loel Lofty [facility] Lab Results  Component Value Date   MSPIKE Not Observed 06/01/2016     Chemistry      Component Value Date/Time   NA 140 09/08/2016 1100   K 4.8 09/08/2016 1100   CL 111 07/08/2016 1750   CL 106 06/01/2016 1537   CO2 22 09/08/2016 1100   BUN 18.1 09/08/2016 1100   CREATININE 1.5 (H) 09/08/2016 1100      Component Value Date/Time   CALCIUM 8.7 09/08/2016 1100    ALKPHOS 80 09/08/2016 1100   AST 20 09/08/2016 1100   ALT 9 09/08/2016 1100   BILITOT 0.51 09/08/2016 1100     Impression and Plan: Ms. Fines is an 83 yo white female with multiple medical including anemia of chronic renal insufficiency. She has responded nicely to Aranesp and Hgb today is 10.3 with an MCV of 88. No injection needed with this visit.  We will see what her iron studies and B 12 look like and bring her back in later this week for an injection or infusion if needed.  We will go ahead and plan to see her back in 1 month for repeat lab work and follow-up.  Both she and her daughters know to contact our office with any questions or  concerns. We can certainly see her sooner if need be.   Verdie MosherINCINNATI,Ahron Hulbert M, NP 2/6/201811:42 AM

## 2016-09-30 ENCOUNTER — Telehealth: Payer: Self-pay | Admitting: *Deleted

## 2016-09-30 LAB — VITAMIN B12: Vitamin B12: 216 pg/mL — ABNORMAL LOW (ref 232–1245)

## 2016-09-30 LAB — RETICULOCYTES: Reticulocyte Count: 0.6 % (ref 0.6–2.6)

## 2016-09-30 NOTE — Telephone Encounter (Addendum)
Patient aware of results. Patients daughter will call back later and schedule appointment.  ----- Message from Verdie MosherSarah M [place], NP sent at 09/30/2016  8:16 AM EST ----- Regarding: B 12 Needs B 12 injection this week please. Iron look s good no infusion needed. Thank you!  Sarah  ----- Message ----- From: Josph MachoPeter R Ennever, MD Sent: 09/30/2016   7:05 AM To: Verdie MosherSarah M [place], NP  Sarah: Please make sure that she is getting B12 injections!!  pete ----- Message ----- From: Interface, Lab In Three Zero One Sent: 09/29/2016  11:39 AM To: Josph MachoPeter R Ennever, MD

## 2016-10-01 ENCOUNTER — Ambulatory Visit
Admission: RE | Admit: 2016-10-01 | Discharge: 2016-10-01 | Disposition: A | Discharge status: Home / Self Care | Payer: Medicare Other | Source: Ambulatory Visit | Attending: Family Medicine | Admitting: Family Medicine

## 2016-10-01 DIAGNOSIS — R9389 Abnormal findings on diagnostic imaging of other specified body structures: Secondary | ICD-10-CM

## 2016-10-01 MED ORDER — IOPAMIDOL (ISOVUE-300) INJECTION 61%
50.0000 mL | Freq: Once | INTRAVENOUS | Status: AC | PRN
  Administered 2016-10-01: 50 mL via INTRAVENOUS

## 2016-10-07 ENCOUNTER — Other Ambulatory Visit: Payer: Self-pay | Admitting: Family

## 2016-10-07 ENCOUNTER — Ambulatory Visit (HOSPITAL_BASED_OUTPATIENT_CLINIC_OR_DEPARTMENT_OTHER): Payer: Medicare Other

## 2016-10-07 VITALS — BP 180/54 | HR 70 | Temp 97.4°F

## 2016-10-07 DIAGNOSIS — D510 Vitamin B12 deficiency anemia due to intrinsic factor deficiency: Secondary | ICD-10-CM | POA: Diagnosis not present

## 2016-10-07 DIAGNOSIS — N183 Chronic kidney disease, stage 3 unspecified: Secondary | ICD-10-CM

## 2016-10-07 DIAGNOSIS — D631 Anemia in chronic kidney disease: Secondary | ICD-10-CM

## 2016-10-07 MED ORDER — CYANOCOBALAMIN 1000 MCG/ML IJ SOLN
INTRAMUSCULAR | Status: AC
  Filled 2016-10-07: qty 1

## 2016-10-07 MED ORDER — CYANOCOBALAMIN 1000 MCG/ML IJ SOLN
1000.0000 ug | Freq: Once | INTRAMUSCULAR | Status: AC
  Administered 2016-10-07: 1000 ug via INTRAMUSCULAR

## 2016-10-07 NOTE — Patient Instructions (Signed)
Cyanocobalamin, Vitamin B12 injection What is this medicine? CYANOCOBALAMIN (sye an oh koe BAL a min) is a man made form of vitamin B12. Vitamin B12 is used in the growth of healthy blood cells, nerve cells, and proteins in the body. It also helps with the metabolism of fats and carbohydrates. This medicine is used to treat people who can not absorb vitamin B12. This medicine may be used for other purposes; ask your health care provider or pharmacist if you have questions. COMMON BRAND NAME(S): B-12 Compliance Kit, B-12 Injection Kit, Cyomin, LA-12, Nutri-Twelve, Physicians EZ Use B-12, Primabalt What should I tell my health care provider before I take this medicine? They need to know if you have any of these conditions: -kidney disease -Leber's disease -megaloblastic anemia -an unusual or allergic reaction to cyanocobalamin, cobalt, other medicines, foods, dyes, or preservatives -pregnant or trying to get pregnant -breast-feeding How should I use this medicine? This medicine is injected into a muscle or deeply under the skin. It is usually given by a health care professional in a clinic or doctor's office. However, your doctor may teach you how to inject yourself. Follow all instructions. Talk to your pediatrician regarding the use of this medicine in children. Special care may be needed. Overdosage: If you think you have taken too much of this medicine contact a poison control center or emergency room at once. NOTE: This medicine is only for you. Do not share this medicine with others. What if I miss a dose? If you are given your dose at a clinic or doctor's office, call to reschedule your appointment. If you give your own injections and you miss a dose, take it as soon as you can. If it is almost time for your next dose, take only that dose. Do not take double or extra doses. What may interact with this medicine? -colchicine -heavy alcohol intake This list may not describe all possible  interactions. Give your health care provider a list of all the medicines, herbs, non-prescription drugs, or dietary supplements you use. Also tell them if you smoke, drink alcohol, or use illegal drugs. Some items may interact with your medicine. What should I watch for while using this medicine? Visit your doctor or health care professional regularly. You may need blood work done while you are taking this medicine. You may need to follow a special diet. Talk to your doctor. Limit your alcohol intake and avoid smoking to get the best benefit. What side effects may I notice from receiving this medicine? Side effects that you should report to your doctor or health care professional as soon as possible: -allergic reactions like skin rash, itching or hives, swelling of the face, lips, or tongue -blue tint to skin -chest tightness, pain -difficulty breathing, wheezing -dizziness -red, swollen painful area on the leg Side effects that usually do not require medical attention (report to your doctor or health care professional if they continue or are bothersome): -diarrhea -headache This list may not describe all possible side effects. Call your doctor for medical advice about side effects. You may report side effects to FDA at 1-800-FDA-1088. Where should I keep my medicine? Keep out of the reach of children. Store at room temperature between 15 and 30 degrees C (59 and 85 degrees F). Protect from light. Throw away any unused medicine after the expiration date. NOTE: This sheet is a summary. It may not cover all possible information. If you have questions about this medicine, talk to your doctor, pharmacist, or   health care provider.  2017 Elsevier/Gold Standard (2007-11-21 22:10:20)  

## 2016-10-20 ENCOUNTER — Telehealth: Payer: Self-pay | Admitting: Internal Medicine

## 2016-10-20 DIAGNOSIS — I313 Pericardial effusion (noninflammatory): Secondary | ICD-10-CM

## 2016-10-20 DIAGNOSIS — I3139 Other pericardial effusion (noninflammatory): Secondary | ICD-10-CM

## 2016-10-20 NOTE — Telephone Encounter (Signed)
Pt's daughter called because pt had a chest CT on 10/01/16. The CT was  order by Dr. Barton FannyVictoria Rankin. Pt was told that she had fluid around her heart. Daughter Mady GemmaHeidi Summey would like for Dr Tenny Crawoss to review  the CT and make recommendations and if pt needs to be seen in this office. Pt's daughter is aware that this message will be to MD for advice.

## 2016-10-20 NOTE — Telephone Encounter (Signed)
New message      Pt had a chest CT on 10-01-16 ordered by another doctor.  They told the pt that she had "fluid" around her heart.  Daughter want Dr Tenny Crawoss to look at CT and see if this was normal for pt or if pt needs to see Dr Tenny Crawoss.  Please advise

## 2016-10-21 NOTE — Telephone Encounter (Signed)
Set pt up for BMET and BNP  Tomorrow or Thursday CT shows pleural effusion. I can see pt on Friday

## 2016-10-22 ENCOUNTER — Other Ambulatory Visit: Payer: Medicare Other | Admitting: *Deleted

## 2016-10-22 DIAGNOSIS — I3139 Other pericardial effusion (noninflammatory): Secondary | ICD-10-CM

## 2016-10-22 DIAGNOSIS — I313 Pericardial effusion (noninflammatory): Secondary | ICD-10-CM

## 2016-10-22 NOTE — Telephone Encounter (Signed)
S/w patient's daughter.   Pt will come today for labs, depending on what time her physical therapist is coming.  If she can't come today, daughter will call and reschedule for tomorrow.  Depending on lab results, we will plan to see her Friday or Monday in clinic.  Pt does not have any new symptoms.   She is not really SOB, has cough that is not new.  No chest pain or tightness.  Activity level is improving since physical therapy started coming.   Reviewed deep breathing and coughing exercises to help pleural effusions.

## 2016-10-23 ENCOUNTER — Other Ambulatory Visit: Payer: Medicare Other

## 2016-10-23 LAB — BASIC METABOLIC PANEL
BUN/Creatinine Ratio: 13 (ref 12–28)
BUN: 20 mg/dL (ref 8–27)
CO2: 20 mmol/L (ref 18–29)
CREATININE: 1.55 mg/dL — AB (ref 0.57–1.00)
Calcium: 8.2 mg/dL — ABNORMAL LOW (ref 8.7–10.3)
Chloride: 105 mmol/L (ref 96–106)
GFR, EST AFRICAN AMERICAN: 35 mL/min/{1.73_m2} — AB (ref >59)
GFR, EST NON AFRICAN AMERICAN: 31 mL/min/{1.73_m2} — AB (ref >59)
Glucose: 97 mg/dL (ref 65–99)
Potassium: 4.7 mmol/L (ref 3.5–5.2)
SODIUM: 141 mmol/L (ref 134–144)

## 2016-10-23 LAB — PRO B NATRIURETIC PEPTIDE: NT-Pro BNP: 10064 pg/mL — ABNORMAL HIGH (ref 0–738)

## 2016-10-26 ENCOUNTER — Encounter (INDEPENDENT_AMBULATORY_CARE_PROVIDER_SITE_OTHER): Payer: Self-pay

## 2016-10-26 ENCOUNTER — Ambulatory Visit (INDEPENDENT_AMBULATORY_CARE_PROVIDER_SITE_OTHER): Payer: Medicare Other | Admitting: Internal Medicine

## 2016-10-26 ENCOUNTER — Encounter: Payer: Self-pay | Admitting: Internal Medicine

## 2016-10-26 VITALS — BP 132/74 | HR 76 | Ht 67.0 in | Wt 129.0 lb

## 2016-10-26 DIAGNOSIS — J90 Pleural effusion, not elsewhere classified: Secondary | ICD-10-CM | POA: Diagnosis not present

## 2016-10-26 NOTE — Telephone Encounter (Signed)
Reviewed with Dr. Tenny Crawoss on 10/23/16, called and left message for patient's daughter to call back at that time.  Dr. Tenny Crawoss would like to see patient in clinic today.    Daughter called in this am and pt was scheduled to see Dr. Tenny Crawoss this afternoon.

## 2016-10-26 NOTE — Progress Notes (Signed)
Cardiology Office Note   Date:  10/26/2016   ID:  [name], DOB August 14, 1931, MRN [id number]  PCP:  Duane LopeAlan Nahjae Hoeg, MD  Cardiologist:   Dietrich PatesPaula Nickcole Bralley, MD   Pt returns for eval of pleural effusion     History of Present Illness: Jessica Moralesancy E Luton is a 83 y.o. female with a history of HTN and CV dz   I saw her in Jan.  At that time she was having problems with upper resp sympotms  Pt had a CT scan of chest 2/8  This showed a mod L sided, small R sided effusion Labs done last wk and pt sched for f/u today  She denies fevers, chills or prod cough  Breathing is fair  Having PT at home       Current Meds  Medication Sig  . amLODipine (NORVASC) 5 MG tablet TAKE ONE TABLET BY MOUTH TWICE DAILY  . aspirin 81 MG tablet Take 81 mg by mouth daily.    Marland Kitchen. atorvastatin (LIPITOR) 20 MG tablet TAKE 1 TABLET (20 MG TOTAL) BY MOUTH DAILY.  Marland Kitchen. CALCIUM PO Take 1 tablet by mouth daily.  . carvedilol (COREG) 3.125 MG tablet TAKE 1 TABLET (3.125 MG TOTAL) BY MOUTH DAILY.  . cholecalciferol (VITAMIN D) 1000 UNITS tablet Take 1,000 Units by mouth daily.  . clopidogrel (PLAVIX) 75 MG tablet TAKE ONE TABLET BY MOUTH DAILY WITH BREAKFAST  . fluticasone (FLONASE) 50 MCG/ACT nasal spray Place 1 spray into both nostrils daily.  . hydrALAZINE (APRESOLINE) 25 MG tablet Take 2 tablets (50 mg total) by mouth 3 (three) times daily.  Marland Kitchen. lisinopril (PRINIVIL,ZESTRIL) 40 MG tablet TAKE ONE TABLET BY MOUTH DAILY  . Multiple Vitamins-Minerals (PRESERVISION AREDS) CAPS Take 1 capsule by mouth daily.      Allergies:   Penicillins; Clonidine derivatives; and Maxzide [hydrochlorothiazide w-triamterene]   Past Medical History:  Diagnosis Date  . Anemia    a. "On/off my whole life" - etiology unclear. Has never required blood transfusion.  . Anemia associated with stage 3 chronic renal failure 06/02/2016  . Arthritis   . CAD (coronary artery disease)   . Carotid artery occlusion    RICA 80-99%, LICA 40-59% by dopplers  06/2013. Also with cerebrovascular disease otherwise on MRA 06/2013.  . Carotid artery stenosis   . Chronic kidney disease   . Chronic renal insufficiency, stage 3 (moderate) 06/02/2016  . Depression   . Dizziness    a. Adm 06/2013: felt due to posterior circulation deficits from vertebrobasilar insufficiency and possibly vertigo.  Marland Kitchen. DVT (deep venous thrombosis) (HCC)    a. Around age 83, details unclear (SVT vs DVT?), s/p vein stripping without recurrence.  . Erythropoietin deficiency anemia 06/02/2016  . Heart murmur    a. Longstanding. mild MR on prior ECG, also may be in setting of known vasc dz.  . Hiatal hernia   . Hyperlipidemia   . Hypertension    a. Intolerant to Maxzide due to hyponatremia. b. intolerant to Clonidine due to slow HR/fatigue. c. Discrepancy in BP felt due to innominant stenosis.  . Iron deficiency anemia due to chronic blood loss 09/08/2016  . Iron malabsorption 09/08/2016  . Macular degeneration    In both eyes - no longer drives.  . Macular degeneration    L- macular degeneration, R eye impaired as well  . Multiple thyroid nodules    a. By US 06/2013. Bx recommended.  . Pernicious anemia 06/02/2016  . PFO (patent foramen ovale)  a. Per echo in 12/2010.  Marland Kitchen PVD (peripheral vascular disease) (HCC)    a. R innominant stenosis. b. 06/2013 ABI: 0.80 R, 0.50 L., DVT- 1970's  . Varicose veins     Past Surgical History:  Procedure Laterality Date  . ABDOMINAL HYSTERECTOMY  1971  . APPENDECTOMY    . BLADDER SURGERY  2004   baldder sling  . CATARACT EXTRACTION    . CESAREAN SECTION     X1  . ENDARTERECTOMY Right 10/31/2015   Procedure: RIGHT CAROTID ENDARTERECTOMY;  Surgeon: Nada Libman, MD;  Location: [facility] OR;  Service: Vascular;  Laterality: Right;  . EYE SURGERY     cataracts removed, /w IOL  . INCONTINENCE SURGERY    . LUMBAR DISC SURGERY    . Neck Muscle Surgery    . SPINE SURGERY    . TOE SURGERY Right    great toe with pins  . VARICOSE VEIN  SURGERY     left leg     Social History:  The patient  reports that she quit smoking about 7 years ago. She has never used smokeless tobacco. She reports that she does not drink alcohol or use drugs.   Family History:  The patient's family history includes Cancer in her mother, sister, and sister; Diabetes in her sister and son; Heart attack in her sister; Hyperlipidemia in her daughter, sister, and sister; Hypertension in her daughter and sister.    ROS:  Please see the history of present illness. All other systems are reviewed and  Negative to the above problem except as noted.    PHYSICAL EXAM: VS:  BP 132/74   Pulse 76   Ht 5\' 7"  (1.702 m)   Wt 129 lb (58.5 kg)   BMI 20.20 kg/m    O2 sat 96%  Decrease to 93% with walkng   Pt comfortable  GEN: Well nourished, well developed, in no acute distress  HEENT: normal  Neck: no JVD, carotid bruits, or masses Cardiac: RRR; no murmurs, rubs, or gallops,Tr edema  Respiratory:  Sl decreased BS at L base   normal work of breathing GI: soft, nontender, nondistended, + BS  No hepatomegaly  MS: no deformity Moving all extremities   Skin: warm and dry, no rash Neuro:  Strength and sensation are intact Psych: euthymic mood, full affect   EKG:  EKG is not  ordered today.   Lipid Panel    Component Value Date/Time   CHOL 137 12/12/2015 1449   CHOL 169 02/19/2014 1255   CHOL 133 02/17/2013 0947   TRIG 77 12/12/2015 1449   TRIG CANCELED 02/19/2014 1255   TRIG 65 02/17/2013 0947   HDL 57 12/12/2015 1449   HDL CANCELED 02/19/2014 1255   HDL 48 02/19/2014 1255   HDL 58 02/17/2013 0947   CHOLHDL 2.4 12/12/2015 1449   VLDL 15 12/12/2015 1449   LDLCALC 65 12/12/2015 1449   LDLCALC 97 02/19/2014 1255   LDLCALC 95 02/19/2014 0000   LDLCALC 62 02/17/2013 0947      Wt Readings from Last 3 Encounters:  10/26/16 129 lb (58.5 kg)  09/29/16 130 lb (59 kg)  09/08/16 129 lb 6.4 oz (58.7 kg)      ASSESSMENT AND PLAN:  1  Pleural  effusion  Volume status overall does not appear to be too bad  Labs though shows signif eleva of NTproBNP  Also show Cr elevated I am not sure adding lasix would work at decreasing effusion without pushing Cr higher  Will order echo to reeval LVEF  Pt has appt with P Ennever tomorrow  Will contact   Would consider pt for USN guided thoracentesis   2  HTN  BP OK  3  ANemia  Has appt with P Ennever  4  Murmur  Basal septal hypertrophy  5  CV dz  Follows with W Brabham  Current medicines are reviewed at length with the patient today.  The patient does not have concerns regarding medicines.  Signed, Dietrich Pates, MD  10/26/2016 5:05 PM    [facility] [address] [place], [place], [place]  [address] Phone: [phone]; Fax: [phone]

## 2016-10-26 NOTE — Patient Instructions (Signed)

## 2016-10-27 ENCOUNTER — Ambulatory Visit (HOSPITAL_BASED_OUTPATIENT_CLINIC_OR_DEPARTMENT_OTHER): Payer: Medicare Other

## 2016-10-27 ENCOUNTER — Ambulatory Visit (HOSPITAL_BASED_OUTPATIENT_CLINIC_OR_DEPARTMENT_OTHER): Payer: Medicare Other | Admitting: Family

## 2016-10-27 ENCOUNTER — Other Ambulatory Visit (HOSPITAL_BASED_OUTPATIENT_CLINIC_OR_DEPARTMENT_OTHER): Payer: Medicare Other

## 2016-10-27 VITALS — BP 146/69 | HR 69 | Temp 98.2°F | Resp 16 | Wt 127.4 lb

## 2016-10-27 DIAGNOSIS — N183 Chronic kidney disease, stage 3 unspecified: Secondary | ICD-10-CM

## 2016-10-27 DIAGNOSIS — J90 Pleural effusion, not elsewhere classified: Secondary | ICD-10-CM

## 2016-10-27 DIAGNOSIS — R0602 Shortness of breath: Secondary | ICD-10-CM | POA: Diagnosis not present

## 2016-10-27 DIAGNOSIS — D631 Anemia in chronic kidney disease: Secondary | ICD-10-CM

## 2016-10-27 DIAGNOSIS — D510 Vitamin B12 deficiency anemia due to intrinsic factor deficiency: Secondary | ICD-10-CM

## 2016-10-27 DIAGNOSIS — D500 Iron deficiency anemia secondary to blood loss (chronic): Secondary | ICD-10-CM | POA: Diagnosis not present

## 2016-10-27 DIAGNOSIS — R5383 Other fatigue: Secondary | ICD-10-CM

## 2016-10-27 LAB — COMPREHENSIVE METABOLIC PANEL
ALBUMIN: 2.6 g/dL — AB (ref 3.5–5.0)
ALK PHOS: 77 U/L (ref 40–150)
ALT: 7 U/L (ref 0–55)
AST: 16 U/L (ref 5–34)
Anion Gap: 8 mEq/L (ref 3–11)
BUN: 21 mg/dL (ref 7.0–26.0)
CO2: 22 mEq/L (ref 22–29)
Calcium: 8.2 mg/dL — ABNORMAL LOW (ref 8.4–10.4)
Chloride: 111 mEq/L — ABNORMAL HIGH (ref 98–109)
Creatinine: 1.6 mg/dL — ABNORMAL HIGH (ref 0.6–1.1)
EGFR: 31 mL/min/{1.73_m2} — ABNORMAL LOW (ref >90)
GLUCOSE: 110 mg/dL (ref 70–140)
POTASSIUM: 4.4 meq/L (ref 3.5–5.1)
SODIUM: 141 meq/L (ref 136–145)
Total Bilirubin: 0.28 mg/dL (ref 0.20–1.20)
Total Protein: 5.3 g/dL — ABNORMAL LOW (ref 6.4–8.3)

## 2016-10-27 LAB — CBC WITH DIFFERENTIAL (CANCER CENTER ONLY)
BASO#: 0.1 10*3/uL (ref 0.0–0.2)
BASO%: 1 % (ref 0.0–2.0)
EOS ABS: 0.1 10*3/uL (ref 0.0–0.5)
EOS%: 1.3 % (ref 0.0–7.0)
HCT: 28.5 % — ABNORMAL LOW (ref 34.8–46.6)
HEMOGLOBIN: 9.2 g/dL — AB (ref 11.6–15.9)
LYMPH#: 0.7 10*3/uL — ABNORMAL LOW (ref 0.9–3.3)
LYMPH%: 7.6 % — AB (ref 14.0–48.0)
MCH: 27.5 pg (ref 26.0–34.0)
MCHC: 32.3 g/dL (ref 32.0–36.0)
MCV: 85 fL (ref 81–101)
MONO#: 0.7 10*3/uL (ref 0.1–0.9)
MONO%: 7.9 % (ref 0.0–13.0)
NEUT#: 7.5 10*3/uL — ABNORMAL HIGH (ref 1.5–6.5)
NEUT%: 82.2 % — AB (ref 39.6–80.0)
PLATELETS: 216 10*3/uL (ref 145–400)
RBC: 3.35 10*6/uL — ABNORMAL LOW (ref 3.70–5.32)
RDW: 17.5 % — AB (ref 11.1–15.7)
WBC: 9.2 10*3/uL (ref 3.9–10.0)

## 2016-10-27 MED ORDER — DARBEPOETIN ALFA 300 MCG/0.6ML IJ SOSY
PREFILLED_SYRINGE | INTRAMUSCULAR | Status: AC
  Filled 2016-10-27: qty 0.6

## 2016-10-27 MED ORDER — DARBEPOETIN ALFA 300 MCG/0.6ML IJ SOSY
300.0000 ug | PREFILLED_SYRINGE | Freq: Once | INTRAMUSCULAR | Status: AC
  Administered 2016-10-27: 300 ug via SUBCUTANEOUS

## 2016-10-27 NOTE — Patient Instructions (Signed)

## 2016-10-27 NOTE — Progress Notes (Signed)
Hematology and Oncology Follow Up Visit  [name] [id number] Apr 27, 1931 83 y.o. 10/27/2016   Principle Diagnosis:  Anemia secondary to chronic renal insufficiency and erythropoietin deficiency  Pernicious anemia  Iron deficiency anemia   Current Therapy:   Aranesp 200 mcg SQ q 3 weeks to keep Hgb > 10 B 12 injections if needed at next visit IV iron as indicated    Interim History:  Ms. [name] is here today with her daughter for follow-up. She is doing fairly well. She is pleasantly confused at time so some information was obtained from her daughter.  She has a moderate left pleural effusion as well as a small right pleural effusion. She is symptomatic with fatigue and SOB with exertion. She has seen cardiology and they are considering a thoracentesis to drain. She had an ECHO yesterday and results are pending.  Hgb today is down slightly at 9.2 with an MCV of 85. Iron studies are stable.  She received a B 12 injection in February. Level at that time was 216. We rechecked this today. She is finishing up her last week or PT this week.   No fever, n/v, cough, rash, dizziness, chest pain, palpitations, abdominal pain or changes in bowel or bladder habits.   No numbness or tingling in her extremities at this time.No c/o pain.  She has maintained a good appetite and is staying hydrated. He daughters are good to encourage her to eat and drink. Her weight is stable.   Medications:  Allergies as of 10/27/2016      Reactions   Penicillins Swelling, Rash   Has patient had a PCN reaction causing immediate rash, facial/tongue/throat swelling, SOB or lightheadedness with hypotension: Unknown Has patient had a PCN reaction causing severe rash involving mucus membranes or skin necrosis: No Has patient had a PCN reaction that required hospitalization No Has patient had a PCN reaction occurring within the last 10 years: No If all of the above answers are "NO", then may proceed with Cephalosporin  use.   Clonidine Derivatives Other (See Comments)   Fatigue Bradycardia   Maxzide [hydrochlorothiazide W-triamterene] Other (See Comments)   hyponatremia      Medication List       Accurate as of 10/27/16  1:48 PM. Always use your most recent med list.          amLODipine 5 MG tablet Commonly known as:  NORVASC TAKE ONE TABLET BY MOUTH TWICE DAILY   aspirin 81 MG tablet Take 81 mg by mouth daily.   atorvastatin 20 MG tablet Commonly known as:  LIPITOR TAKE 1 TABLET (20 MG TOTAL) BY MOUTH DAILY.   CALCIUM PO Take 1 tablet by mouth daily.   carvedilol 3.125 MG tablet Commonly known as:  COREG TAKE 1 TABLET (3.125 MG TOTAL) BY MOUTH DAILY.   cholecalciferol 1000 units tablet Commonly known as:  VITAMIN D Take 1,000 Units by mouth daily.   clopidogrel 75 MG tablet Commonly known as:  PLAVIX TAKE ONE TABLET BY MOUTH DAILY WITH BREAKFAST   fluticasone 50 MCG/ACT nasal spray Commonly known as:  FLONASE Place 1 spray into both nostrils daily.   hydrALAZINE 25 MG tablet Commonly known as:  APRESOLINE Take 2 tablets (50 mg total) by mouth 3 (three) times daily.   lisinopril 40 MG tablet Commonly known as:  PRINIVIL,ZESTRIL TAKE ONE TABLET BY MOUTH DAILY   PRESERVISION AREDS Caps Take 1 capsule by mouth daily.       Allergies:  Allergies  Allergen  Reactions  . Penicillins Swelling and Rash    Has patient had a PCN reaction causing immediate rash, facial/tongue/throat swelling, SOB or lightheadedness with hypotension: Unknown Has patient had a PCN reaction causing severe rash involving mucus membranes or skin necrosis: No Has patient had a PCN reaction that required hospitalization No Has patient had a PCN reaction occurring within the last 10 years: No If all of the above answers are "NO", then may proceed with Cephalosporin use.   . Clonidine Derivatives Other (See Comments)    Fatigue Bradycardia   . Maxzide [Hydrochlorothiazide W-Triamterene] Other (See  Comments)    hyponatremia    Past Medical History, Surgical history, Social history, and Family History were reviewed and updated.  Review of Systems: All other 10 point review of systems is negative.   Physical Exam:  weight is 127 lb 6.4 oz (57.8 kg). Her oral temperature is 98.2 F (36.8 C). Her blood pressure is 146/69 (abnormal) and her pulse is 69. Her respiration is 16 and oxygen saturation is 99%.   Wt Readings from Last 3 Encounters:  10/27/16 127 lb 6.4 oz (57.8 kg)  10/26/16 129 lb (58.5 kg)  09/29/16 130 lb (59 kg)    Ocular: Sclerae unicteric, pupils equal, round and reactive to light Ear-nose-throat: Oropharynx clear, dentition fair Lymphatic: No cervical supraclavicular or axillary adenopathy Lungs no rales or rhonchi, good excursion bilaterally Heart regular rate and rhythm, no murmur appreciated Abd soft, nontender, positive bowel sounds, no liver or spleen tip palpated on exam MSK no focal spinal tenderness, no joint edema Neuro: non-focal, well-oriented, appropriate affect Breasts: None  Lab Results  Component Value Date   WBC 9.2 10/27/2016   HGB 9.2 (L) 10/27/2016   HCT 28.5 (L) 10/27/2016   MCV 85 10/27/2016   PLT 216 10/27/2016   Lab Results  Component Value Date   FERRITIN 39 09/29/2016   IRON 51 09/29/2016   TIBC 246 09/29/2016   UIBC 196 09/29/2016   IRONPCTSAT 21 09/29/2016   Lab Results  Component Value Date   RETICCTPCT 1.3 05/11/2014   RBC 3.35 (L) 10/27/2016   RETICCTABS 44.6 05/11/2014   No results found for: KPAFRELGTCHN, LAMBDASER, KAPLAMBRATIO No results found for: Adalberto Ill Lab Results  Component Value Date   MSPIKE Not Observed 06/01/2016     Chemistry      Component Value Date/Time   NA 141 10/22/2016 1219   NA 140 09/08/2016 1100   K 4.7 10/22/2016 1219   K 4.8 09/08/2016 1100   CL 105 10/22/2016 1219   CL 106 06/01/2016 1537   CO2 20 10/22/2016 1219   CO2 22 09/08/2016 1100   BUN 20 10/22/2016  1219   BUN 18.1 09/08/2016 1100   CREATININE 1.55 (H) 10/22/2016 1219   CREATININE 1.5 (H) 09/08/2016 1100      Component Value Date/Time   CALCIUM 8.2 (L) 10/22/2016 1219   CALCIUM 8.7 09/08/2016 1100   ALKPHOS 80 09/08/2016 1100   AST 20 09/08/2016 1100   ALT 9 09/08/2016 1100   BILITOT 0.51 09/08/2016 1100     Impression and Plan: Ms. Weill is an 83 yo white female with multiple medical including anemia of chronic renal insufficiency and pernicious anemia. She is symptomatic with fatigued and SOB with exertion. She has bilateral pleural effusions (small on right and moderate on left). ECHO result is pending and cardiology will determine whether or not to proceed with thoracentesis.  Hgb is 9.4 so she will received Aranesp  today.  We will see what her iron studies show and bring her back in later this week for an infusion if needed. B 12 is pending.  We will go ahead and plan to see her back in 1 month for repeat lab work and follow-up.  Her daughters know to contact our office with any questions or concerns. We can certainly see her sooner if need be.   Verdie Mosher, NP 3/6/20181:48 PM

## 2016-10-28 ENCOUNTER — Telehealth: Payer: Self-pay | Admitting: *Deleted

## 2016-10-28 LAB — IRON AND TIBC
%SAT: 9 % — ABNORMAL LOW (ref 21–57)
IRON: 19 ug/dL — AB (ref 41–142)
TIBC: 206 ug/dL — AB (ref 236–444)
UIBC: 187 ug/dL (ref 120–384)

## 2016-10-28 LAB — FERRITIN: Ferritin: 86 ng/ml (ref 9–269)

## 2016-10-28 LAB — VITAMIN B12: Vitamin B12: 384 pg/mL (ref 232–1245)

## 2016-10-28 NOTE — Telephone Encounter (Addendum)
Patient's daughter aware of results. First appointment made  ----- Message from Verdie MosherSarah M [place], NP sent at 10/28/2016  1:33 PM EST ----- Regarding: Iron   She needs 2 doses of IV iron scheduled with the first dose next week please. Thank you!  Sarah  ----- Message ----- From: Interface, Lab In Three Zero One Sent: 10/27/2016   1:47 PM To: Verdie MosherSarah M [place], NP

## 2016-11-03 ENCOUNTER — Other Ambulatory Visit: Payer: Self-pay | Admitting: Family

## 2016-11-03 ENCOUNTER — Ambulatory Visit (HOSPITAL_BASED_OUTPATIENT_CLINIC_OR_DEPARTMENT_OTHER): Payer: Medicare Other

## 2016-11-03 VITALS — BP 134/50 | HR 62 | Temp 97.8°F | Resp 18

## 2016-11-03 DIAGNOSIS — D631 Anemia in chronic kidney disease: Secondary | ICD-10-CM

## 2016-11-03 DIAGNOSIS — N183 Chronic kidney disease, stage 3 unspecified: Secondary | ICD-10-CM

## 2016-11-03 DIAGNOSIS — D500 Iron deficiency anemia secondary to blood loss (chronic): Secondary | ICD-10-CM

## 2016-11-03 MED ORDER — SODIUM CHLORIDE 0.9 % IV SOLN
510.0000 mg | Freq: Once | INTRAVENOUS | Status: AC
  Administered 2016-11-03: 510 mg via INTRAVENOUS
  Filled 2016-11-03: qty 17

## 2016-11-03 MED ORDER — SODIUM CHLORIDE 0.9 % IV SOLN
Freq: Once | INTRAVENOUS | Status: AC
  Administered 2016-11-03: 15:00:00 via INTRAVENOUS

## 2016-11-05 ENCOUNTER — Other Ambulatory Visit: Payer: Self-pay | Admitting: Internal Medicine

## 2016-11-12 ENCOUNTER — Other Ambulatory Visit: Payer: Self-pay

## 2016-11-12 ENCOUNTER — Ambulatory Visit (HOSPITAL_COMMUNITY): Payer: Medicare Other | Attending: Cardiovascular Disease

## 2016-11-12 DIAGNOSIS — J90 Pleural effusion, not elsewhere classified: Secondary | ICD-10-CM | POA: Diagnosis not present

## 2016-11-12 DIAGNOSIS — I313 Pericardial effusion (noninflammatory): Secondary | ICD-10-CM | POA: Insufficient documentation

## 2016-11-19 ENCOUNTER — Other Ambulatory Visit: Payer: Self-pay | Admitting: Internal Medicine

## 2016-11-19 ENCOUNTER — Other Ambulatory Visit: Payer: Self-pay | Admitting: *Deleted

## 2016-11-19 DIAGNOSIS — J90 Pleural effusion, not elsewhere classified: Secondary | ICD-10-CM

## 2016-11-19 NOTE — Progress Notes (Signed)
Notes recorded by Pricilla RifflePaula Ross V, MD on 11/12/2016 at 10:47 PM EDT Echo shows vigorous pumping function of heart. No signif change from echo in 2016 With L pleural effusion I would refer Pt to undergo L thoracentesis. Sched through hosp as outpt    Called radiology scheduling at the hospital and scheduled patient for left thoracentesis on Mon 11/23/16 at 1:00 pm   Pt is to arrive at 1:45 pm. Order placed.   Left message on patient's daughter's phone to call back before 5pm today or tomorrow after 8:00 am.

## 2016-11-20 NOTE — Progress Notes (Signed)
LMTCB

## 2016-11-20 NOTE — Progress Notes (Signed)
I spoke with patient's daughter, [name] (on [place]). I advised of procedure, gave appt information. She voiced understanding and agreed with plan.

## 2016-11-23 ENCOUNTER — Ambulatory Visit (HOSPITAL_COMMUNITY)
Admission: RE | Admit: 2016-11-23 | Discharge: 2016-11-23 | Disposition: A | Discharge status: Home / Self Care | Payer: Medicare Other | Source: Ambulatory Visit | Attending: General Surgery | Admitting: General Surgery

## 2016-11-23 ENCOUNTER — Encounter (HOSPITAL_COMMUNITY): Payer: Self-pay | Admitting: General Surgery

## 2016-11-23 ENCOUNTER — Ambulatory Visit (HOSPITAL_COMMUNITY)
Admission: RE | Admit: 2016-11-23 | Discharge: 2016-11-23 | Disposition: A | Discharge status: Home / Self Care | Payer: Medicare Other | Source: Ambulatory Visit | Attending: Internal Medicine | Admitting: Internal Medicine

## 2016-11-23 ENCOUNTER — Telehealth: Payer: Self-pay | Admitting: Internal Medicine

## 2016-11-23 DIAGNOSIS — J90 Pleural effusion, not elsewhere classified: Secondary | ICD-10-CM

## 2016-11-23 DIAGNOSIS — Z9889 Other specified postprocedural states: Secondary | ICD-10-CM

## 2016-11-23 HISTORY — PX: IR THORACENTESIS ASP PLEURAL SPACE W/IMG GUIDE: IMG5380

## 2016-11-23 LAB — BODY FLUID CELL COUNT WITH DIFFERENTIAL
EOS FL: NONE SEEN %
LYMPHS FL: 38 %
Monocyte-Macrophage-Serous Fluid: 52 % (ref 50–90)
NEUTROPHIL FLUID: 10 % (ref 0–25)
WBC FLUID: 205 uL (ref 0–1000)

## 2016-11-23 LAB — PROTEIN, PLEURAL OR PERITONEAL FLUID: Total protein, fluid: <3 g/dL

## 2016-11-23 NOTE — Telephone Encounter (Signed)
New Message:    Pt is scheduled for procedure at the hospital today at 1:00. Pt is not going to be able to have it,seen like fluid is ina lot of area of her body. Please call asap,her daughter is very concerned.

## 2016-11-23 NOTE — Telephone Encounter (Signed)
Spoke with daughter who is reporting pt has a fluid build up on the left side of her body.  She reports it is from her left shoulder blade down to her legs.  She reports it started a few days ago.  Pt sleeps on her left side and that does occur from time to time but not usually this much.  Daughter is advised to take pt for her scheduled procedure today as planned.  Advised I will forward this information to Dr Tenny Craw as pt does not have a plan as to when to f/u with her.

## 2016-11-23 NOTE — Procedures (Signed)
Ultrasound-guided diagnostic and therapeutic left thoracentesis performed yielding 1liters of yellow colored fluid. No immediate complications. Follow-up chest x-ray pending.       Mukhtar Shams E 1:37 PM 11/23/2016

## 2016-11-24 ENCOUNTER — Ambulatory Visit: Payer: Medicare Other

## 2016-11-24 ENCOUNTER — Ambulatory Visit (HOSPITAL_BASED_OUTPATIENT_CLINIC_OR_DEPARTMENT_OTHER): Payer: Medicare Other | Admitting: Family

## 2016-11-24 ENCOUNTER — Other Ambulatory Visit (HOSPITAL_BASED_OUTPATIENT_CLINIC_OR_DEPARTMENT_OTHER): Payer: Medicare Other

## 2016-11-24 VITALS — BP 126/65 | HR 61 | Temp 97.8°F | Resp 19 | Wt 127.4 lb

## 2016-11-24 DIAGNOSIS — D510 Vitamin B12 deficiency anemia due to intrinsic factor deficiency: Secondary | ICD-10-CM

## 2016-11-24 DIAGNOSIS — D631 Anemia in chronic kidney disease: Secondary | ICD-10-CM

## 2016-11-24 DIAGNOSIS — D500 Iron deficiency anemia secondary to blood loss (chronic): Secondary | ICD-10-CM

## 2016-11-24 DIAGNOSIS — N183 Chronic kidney disease, stage 3 unspecified: Secondary | ICD-10-CM

## 2016-11-24 LAB — CBC WITH DIFFERENTIAL (CANCER CENTER ONLY)
BASO#: 0.1 10*3/uL (ref 0.0–0.2)
BASO%: 1.1 % (ref 0.0–2.0)
EOS ABS: 0.1 10*3/uL (ref 0.0–0.5)
EOS%: 1.6 % (ref 0.0–7.0)
HEMATOCRIT: 36 % (ref 34.8–46.6)
HGB: 11.6 g/dL (ref 11.6–15.9)
LYMPH#: 0.7 10*3/uL — AB (ref 0.9–3.3)
LYMPH%: 10.3 % — ABNORMAL LOW (ref 14.0–48.0)
MCH: 27.4 pg (ref 26.0–34.0)
MCHC: 32.2 g/dL (ref 32.0–36.0)
MCV: 85 fL (ref 81–101)
MONO#: 0.5 10*3/uL (ref 0.1–0.9)
MONO%: 7.1 % (ref 0.0–13.0)
NEUT#: 5.6 10*3/uL (ref 1.5–6.5)
NEUT%: 79.9 % (ref 39.6–80.0)
Platelets: 212 10*3/uL (ref 145–400)
RBC: 4.23 10*6/uL (ref 3.70–5.32)
RDW: 19.1 % — AB (ref 11.1–15.7)
WBC: 7.1 10*3/uL (ref 3.9–10.0)

## 2016-11-24 LAB — COMPREHENSIVE METABOLIC PANEL
ALK PHOS: 79 U/L (ref 40–150)
ALT: 9 U/L (ref 0–55)
ANION GAP: 8 meq/L (ref 3–11)
AST: 18 U/L (ref 5–34)
Albumin: 2.7 g/dL — ABNORMAL LOW (ref 3.5–5.0)
BUN: 21.1 mg/dL (ref 7.0–26.0)
CALCIUM: 8.2 mg/dL — AB (ref 8.4–10.4)
CHLORIDE: 112 meq/L — AB (ref 98–109)
CO2: 20 mEq/L — ABNORMAL LOW (ref 22–29)
Creatinine: 1.6 mg/dL — ABNORMAL HIGH (ref 0.6–1.1)
EGFR: 29 mL/min/{1.73_m2} — ABNORMAL LOW (ref >90)
Glucose: 109 mg/dl (ref 70–140)
POTASSIUM: 4.8 meq/L (ref 3.5–5.1)
Sodium: 140 mEq/L (ref 136–145)
Total Bilirubin: 0.38 mg/dL (ref 0.20–1.20)
Total Protein: 5.4 g/dL — ABNORMAL LOW (ref 6.4–8.3)

## 2016-11-24 LAB — PATHOLOGIST SMEAR REVIEW

## 2016-11-24 NOTE — Progress Notes (Signed)
Hematology and Oncology Follow Up Visit  [name] [id number] 1931/03/29 83 y.o. 11/24/2016   Principle Diagnosis:  Anemia secondary to chronic renal insufficiency and erythropoietin deficiency  Pernicious anemia  Iron deficiency anemia   Current Therapy:   Aranesp 200 mcg SQ q 3 weeks to keep Hgb > 10 B 12 injections if needed at next visit IV iron as indicated    Interim History:  [name] is here today with her daughter for follow-up. She is doing well after her thoracentesis yesterday. 1 liter of yellow fluid was removed from the left pleural space. Her SOB is improved.  No anemia, Hgb is 11.6. WBC count is 7.1 with platelet count of 85. Iron studies and B 12 pending.  No fever, n/v, cough, rash, dizziness, chest pain, palpitations, abdominal pain or changes in bowel or bladder habits.   No numbness or tingling in her extremities at this time.No c/o pain. She has swelling in the left ankle that comes and goes. Pedal pulses are +1.  She has a good appetite and is staying hydrated. Her weight is stable.   Medications:  Allergies as of 11/24/2016      Reactions   Penicillins Swelling, Rash   Has patient had a PCN reaction causing immediate rash, facial/tongue/throat swelling, SOB or lightheadedness with hypotension: Unknown Has patient had a PCN reaction causing severe rash involving mucus membranes or skin necrosis: No Has patient had a PCN reaction that required hospitalization No Has patient had a PCN reaction occurring within the last 10 years: No If all of the above answers are "NO", then may proceed with Cephalosporin use.   Clonidine Derivatives Other (See Comments)   Fatigue Bradycardia   Maxzide [hydrochlorothiazide W-triamterene] Other (See Comments)   hyponatremia      Medication List       Accurate as of 11/24/16 12:38 PM. Always use your most recent med list.          amLODipine 5 MG tablet Commonly known as:  NORVASC TAKE ONE TABLET BY MOUTH TWICE  DAILY   aspirin 81 MG tablet Take 81 mg by mouth daily.   atorvastatin 20 MG tablet Commonly known as:  LIPITOR TAKE 1 TABLET (20 MG TOTAL) BY MOUTH DAILY.   CALCIUM PO Take 1 tablet by mouth daily.   carvedilol 3.125 MG tablet Commonly known as:  COREG TAKE 1 TABLET (3.125 MG TOTAL) BY MOUTH DAILY.   cholecalciferol 1000 units tablet Commonly known as:  VITAMIN D Take 1,000 Units by mouth daily.   clopidogrel 75 MG tablet Commonly known as:  PLAVIX TAKE ONE TABLET BY MOUTH DAILY WITH BREAKFAST   fluticasone 50 MCG/ACT nasal spray Commonly known as:  FLONASE Place 1 spray into both nostrils daily.   hydrALAZINE 25 MG tablet Commonly known as:  APRESOLINE Take 2 tablets (50 mg total) by mouth 3 (three) times daily.   lisinopril 40 MG tablet Commonly known as:  PRINIVIL,ZESTRIL TAKE ONE TABLET BY MOUTH DAILY   PRESERVISION AREDS Caps Take 1 capsule by mouth daily.       Allergies:  Allergies  Allergen Reactions  . Penicillins Swelling and Rash    Has patient had a PCN reaction causing immediate rash, facial/tongue/throat swelling, SOB or lightheadedness with hypotension: Unknown Has patient had a PCN reaction causing severe rash involving mucus membranes or skin necrosis: No Has patient had a PCN reaction that required hospitalization No Has patient had a PCN reaction occurring within the last 10 years: No  If all of the above answers are "NO", then may proceed with Cephalosporin use.   . Clonidine Derivatives Other (See Comments)    Fatigue Bradycardia   . Maxzide [Hydrochlorothiazide W-Triamterene] Other (See Comments)    hyponatremia    Past Medical History, Surgical history, Social history, and Family History were reviewed and updated.  Review of Systems: All other 10 point review of systems is negative.   Physical Exam:  weight is 127 lb 6.4 oz (57.8 kg). Her oral temperature is 97.8 F (36.6 C). Her blood pressure is 126/65 and her pulse is 61.  Her respiration is 19 and oxygen saturation is 97%.   Wt Readings from Last 3 Encounters:  11/24/16 127 lb 6.4 oz (57.8 kg)  10/27/16 127 lb 6.4 oz (57.8 kg)  10/26/16 129 lb (58.5 kg)    Ocular: Sclerae unicteric, pupils equal, round and reactive to light Ear-nose-throat: Oropharynx clear, dentition fair Lymphatic: No cervical supraclavicular or axillary adenopathy Lungs no rales or rhonchi, good excursion bilaterally Heart regular rate and rhythm, no murmur appreciated Abd soft, nontender, positive bowel sounds, no liver or spleen tip palpated on exam MSK no focal spinal tenderness, no joint edema Neuro: non-focal, well-oriented, appropriate affect Breasts: None  Lab Results  Component Value Date   WBC 7.1 11/24/2016   HGB 11.6 11/24/2016   HCT 36.0 11/24/2016   MCV 85 11/24/2016   PLT 212 11/24/2016   Lab Results  Component Value Date   FERRITIN 86 10/27/2016   IRON 19 (L) 10/27/2016   TIBC 206 (L) 10/27/2016   UIBC 187 10/27/2016   IRONPCTSAT 9 (L) 10/27/2016   Lab Results  Component Value Date   RETICCTPCT 1.3 05/11/2014   RBC 4.23 11/24/2016   RETICCTABS 44.6 05/11/2014   No results found for: KPAFRELGTCHN, LAMBDASER, KAPLAMBRATIO No results found for: Loel L[facility] Lab Results  Component Value Date   [facility] Not Observed 06/01/2016     Chemistry      Component Value Date/Time   NA 141 10/27/2016 1306   K 4.4 10/27/2016 1306   CL 105 10/22/2016 1219   CL 106 06/01/2016 1537   CO2 22 10/27/2016 1306   BUN 21.0 10/27/2016 1306   CREATININE 1.6 (H) 10/27/2016 1306      Component Value Date/Time   CALCIUM 8.2 (L) 10/27/2016 1306   ALKPHOS 77 10/27/2016 1306   AST 16 10/27/2016 1306   ALT 7 10/27/2016 1306   BILITOT 0.28 10/27/2016 1306     Impression and Plan: [name] is an 83 yo white female with multiple medical including anemia of chronic renal insufficiency and pernicious anemia. She is feeling a little better since having her  thoracentesis yesterday. They were able to remove 1 liter of fluid. She states that this was sent off and results are negative.  She is waiting to hear back from cardiology as to whether or not she needs to be on a diuretic.  We will see what her iron studies show and bring her back in later this week for an infusion if needed. B 12 is pending. Hgb was stable so no Aranesp needed at this time.  We will go ahead and plan to see her back in 1 month for repeat lab work and follow-up.  Her daughters know to contact our office with any questions or concerns. We can certainly see her sooner if need be.   Verdie Mosher, NP 4/3/201812:38 PM

## 2016-11-25 ENCOUNTER — Telehealth: Payer: Self-pay

## 2016-11-25 LAB — IRON AND TIBC
%SAT: 28 % (ref 21–57)
Iron: 54 ug/dL (ref 41–142)
TIBC: 190 ug/dL — AB (ref 236–444)
UIBC: 136 ug/dL (ref 120–384)

## 2016-11-25 LAB — FERRITIN: Ferritin: 205 ng/ml (ref 9–269)

## 2016-11-25 LAB — VITAMIN B12: Vitamin B12: 345 pg/mL (ref 232–1245)

## 2016-11-25 NOTE — Telephone Encounter (Addendum)
-----   Message from Verdie Mosher, NP sent at 11/25/2016 12:48 PM EDT ----- Regarding: Iron and B 12 Counts look good. No infusion needed at this time. Thank you!  Sarah  Above message given to pt's daughter [name] who verbalizes understanding and appreciation. dph

## 2016-11-26 ENCOUNTER — Other Ambulatory Visit: Payer: Self-pay | Admitting: Internal Medicine

## 2016-12-02 ENCOUNTER — Encounter: Payer: Self-pay | Admitting: Internal Medicine

## 2016-12-03 ENCOUNTER — Encounter: Payer: Self-pay | Admitting: Family

## 2016-12-14 ENCOUNTER — Ambulatory Visit (HOSPITAL_COMMUNITY)
Admission: RE | Admit: 2016-12-14 | Discharge: 2016-12-14 | Disposition: A | Discharge status: Home / Self Care | Payer: Medicare Other | Source: Ambulatory Visit | Attending: Family | Admitting: Family

## 2016-12-14 ENCOUNTER — Encounter: Payer: Self-pay | Admitting: Family

## 2016-12-14 ENCOUNTER — Ambulatory Visit (INDEPENDENT_AMBULATORY_CARE_PROVIDER_SITE_OTHER): Payer: Medicare Other | Admitting: Family

## 2016-12-14 VITALS — BP 170/62 | HR 66 | Temp 97.4°F | Resp 16 | Ht 67.0 in | Wt 129.0 lb

## 2016-12-14 DIAGNOSIS — I6523 Occlusion and stenosis of bilateral carotid arteries: Secondary | ICD-10-CM

## 2016-12-14 DIAGNOSIS — I771 Stricture of artery: Secondary | ICD-10-CM | POA: Diagnosis not present

## 2016-12-14 DIAGNOSIS — I779 Disorder of arteries and arterioles, unspecified: Secondary | ICD-10-CM

## 2016-12-14 DIAGNOSIS — I6521 Occlusion and stenosis of right carotid artery: Secondary | ICD-10-CM | POA: Diagnosis present

## 2016-12-14 DIAGNOSIS — Z9889 Other specified postprocedural states: Secondary | ICD-10-CM | POA: Diagnosis not present

## 2016-12-14 DIAGNOSIS — Z87891 Personal history of nicotine dependence: Secondary | ICD-10-CM

## 2016-12-14 LAB — VAS US CAROTID
LCCADDIAS: 11 cm/s
LCCADSYS: 120 cm/s
LEFT ECA DIAS: 48 cm/s
LICADDIAS: -27 cm/s
LICAPDIAS: -25 cm/s
Left CCA prox dias: 10 cm/s
Left CCA prox sys: 93 cm/s
Left ICA dist sys: -193 cm/s
Left ICA prox sys: -362 cm/s
RCCADSYS: -62 cm/s
RCCAPDIAS: 12 cm/s
RCCAPSYS: 80 cm/s
RIGHT CCA MID DIAS: 11 cm/s
RIGHT ECA DIAS: -3 cm/s

## 2016-12-14 NOTE — Progress Notes (Signed)
Chief Complaint: Follow up Extracranial Carotid Artery Stenosis   History of Present Illness  [name] is a 83 y.o. female who is status post right carotid endarterectomy on 10/31/2015 by Dr. Myra Gianotti.  She continues to be followed for innominate artery stenosis as well as left carotid stenosis.  She denies any known history of stroke or TIA. Specifically she denies a history of amaurosis fugax or monocular blindness, unilateral facial drooping, hemiplegia, or receptive or expressive aphasia; daughter concurs.    She had a recent thoracentesis by a NP in Dr. Tenny Craw office; daughter states pt remains dyspneic. Daughter states pt is also unsteady on her feet; it is unclear if she walks enough to elicit claudication symptoms. Daughter denies that pt has non healing wounds. Daughter reports that pt seems to have some degree of confusion.  Daughter states pt has IDA, had iron infusions.  She also has a diagnosis of pernicious anemia.  She also has stage 3 CKD.  Other medical problems include mitral regurgitation, PFO, and macular degeneration.    Pt Diabetic: no Pt smoker: former smoker, started in her 20's, quit about 2011  Pt meds include: Statin : yes ASA: yes Other anticoagulants/antiplatelets: no   Past Medical History:  Diagnosis Date  . Anemia    a. "On/off my whole life" - etiology unclear. Has never required blood transfusion.  . Anemia associated with stage 3 chronic renal failure 06/02/2016  . Arthritis   . CAD (coronary artery disease)   . Carotid artery occlusion    RICA 80-99%, LICA 40-59% by dopplers 06/2013. Also with cerebrovascular disease otherwise on MRA 06/2013.  . Carotid artery stenosis   . Chronic kidney disease   . Chronic renal insufficiency, stage 3 (moderate) 06/02/2016  . Depression   . Dizziness    a. Adm 06/2013: felt due to posterior circulation deficits from vertebrobasilar insufficiency and possibly vertigo.  Marland Kitchen DVT (deep venous  thrombosis) (HCC)    a. Around age 65, details unclear (SVT vs DVT?), s/p vein stripping without recurrence.  . Erythropoietin deficiency anemia 06/02/2016  . Heart murmur    a. Longstanding. mild MR on prior ECG, also may be in setting of known vasc dz.  . Hiatal hernia   . Hyperlipidemia   . Hypertension    a. Intolerant to Maxzide due to hyponatremia. b. intolerant to Clonidine due to slow HR/fatigue. c. Discrepancy in BP felt due to innominant stenosis.  . Iron deficiency anemia due to chronic blood loss 09/08/2016  . Iron malabsorption 09/08/2016  . Macular degeneration    In both eyes - no longer drives.  . Macular degeneration    L- macular degeneration, R eye impaired as well  . Multiple thyroid nodules    a. By [place] 06/2013. Bx recommended.  . Pernicious anemia 06/02/2016  . PFO (patent foramen ovale)    a. Per echo in 12/2010.  Marland Kitchen PVD (peripheral vascular disease) (HCC)    a. R innominant stenosis. b. 06/2013 ABI: 0.80 R, 0.50 L., DVT- 1970's  . Varicose veins     Social History Social History  Substance Use Topics  . Smoking status: Former Smoker    Quit date: 08/24/2009  . Smokeless tobacco: Never Used  . Alcohol use No    Family History Family History  Problem Relation Age of Onset  . Cancer Mother     [name]  . Diabetes Sister   . Cancer Sister   . Hyperlipidemia Sister   . Heart attack  Sister   . Hypertension Sister   . Cancer Sister     Breast  . Hyperlipidemia Sister   . Diabetes Son   . Hyperlipidemia Daughter   . Hypertension Daughter     Surgical History Past Surgical History:  Procedure Laterality Date  . ABDOMINAL HYSTERECTOMY  1971  . APPENDECTOMY    . BLADDER SURGERY  2004   baldder sling  . CATARACT EXTRACTION    . CESAREAN SECTION     X1  . ENDARTERECTOMY Right 10/31/2015   Procedure: RIGHT CAROTID ENDARTERECTOMY;  Surgeon: Nada Libman, MD;  Location: [facility] OR;  Service: Vascular;  Laterality: Right;  . EYE SURGERY     cataracts  removed, /w IOL  . INCONTINENCE SURGERY    . IR THORACENTESIS ASP PLEURAL SPACE W/IMG GUIDE  11/23/2016  . LUMBAR DISC SURGERY    . Neck Muscle Surgery    . SPINE SURGERY    . TOE SURGERY Right    great toe with pins  . VARICOSE VEIN SURGERY     left leg    Allergies  Allergen Reactions  . Penicillins Swelling and Rash    Has patient had a PCN reaction causing immediate rash, facial/tongue/throat swelling, SOB or lightheadedness with hypotension: Unknown Has patient had a PCN reaction causing severe rash involving mucus membranes or skin necrosis: No Has patient had a PCN reaction that required hospitalization No Has patient had a PCN reaction occurring within the last 10 years: No If all of the above answers are "NO", then may proceed with Cephalosporin use.   . Clonidine Derivatives Other (See Comments)    Fatigue Bradycardia   . Maxzide [Hydrochlorothiazide W-Triamterene] Other (See Comments)    hyponatremia    Current Outpatient Prescriptions  Medication Sig Dispense Refill  . amLODipine (NORVASC) 5 MG tablet TAKE ONE TABLET BY MOUTH TWICE DAILY 180 tablet 3  . aspirin 81 MG tablet Take 81 mg by mouth daily.      Marland Kitchen atorvastatin (LIPITOR) 20 MG tablet TAKE 1 TABLET (20 MG TOTAL) BY MOUTH DAILY. 90 tablet 2  . CALCIUM PO Take 1 tablet by mouth daily.    . carvedilol (COREG) 3.125 MG tablet TAKE 1 TABLET (3.125 MG TOTAL) BY MOUTH DAILY. 90 tablet 3  . cholecalciferol (VITAMIN D) 1000 UNITS tablet Take 1,000 Units by mouth daily.    . clopidogrel (PLAVIX) 75 MG tablet TAKE ONE TABLET BY MOUTH DAILY WITH BREAKFAST 90 tablet 0  . fluticasone (FLONASE) 50 MCG/ACT nasal spray Place 1 spray into both nostrils daily.  0  . hydrALAZINE (APRESOLINE) 25 MG tablet Take 2 tablets (50 mg total) by mouth 3 (three) times daily. 540 tablet 2  . lisinopril (PRINIVIL,ZESTRIL) 40 MG tablet TAKE ONE TABLET BY MOUTH DAILY 90 tablet 3  . Multiple Vitamins-Minerals (PRESERVISION AREDS) CAPS Take 1  capsule by mouth daily.      No current facility-administered medications for this visit.     Review of Systems : See HPI for pertinent positives and negatives.  Physical Examination  Vitals:   12/14/16 1335 12/14/16 1338  BP: 129/66 (!) 170/62  Pulse: 64 66  Resp: 16   Temp:  97.4 F (36.3 C)  TempSrc:  Oral  SpO2: 95%   Weight: 129 lb (58.5 kg)   Height:  (1.702 m)    Body mass index is 20.2 kg/m.  General: WDWN female in NAD GAIT: slow, deliberate Eyes: PERRLA  Pulmonary:  Respirations are slightly labored at  rest, good air movement, CTAB, no rales, rhonchi, or  wheezing.  Cardiac: regular rhythm, +murmur.  VASCULAR EXAM Carotid Bruits Right Left   Positive Positive    Aorta is faintly palpable. Radial pulses are 2+ right, 3+ left palpable.                                                                                                                            LE Pulses Right Left       FEMORAL  not palpable  1+ palpable        POPLITEAL  not palpable   not palpable       POSTERIOR TIBIAL  not palpable   not palpable        DORSALIS PEDIS      ANTERIOR TIBIAL not palpable  not palpable     Gastrointestinal: soft, nontender, BS WNL, no r/g, no palpable masses.  Musculoskeletal: No muscle atrophy/wasting. M/S 4/5 throughout, extremities without ischemic changes. Left foot bunion. Right second toe adduction over great toe.  Ecchymosis at anterior right shin.   Neurologic: A&O X 3; Appropriate Affect, Speech is normal, CN 2-12 intact except has some hearing loss, pain and light touch intact in extremities, Motor exam as listed above.    Assessment: EMMAMAE MCNAMARA is a 83 y.o. female who is status post right carotid endarterectomy on 10/31/2015. She has no history of stroke or TIA.   ABI's in 2015 indicated moderate arterial occlusive disease in the right LE and severe in the left. There are no signs of ischemia in her feet or legs.  She is  unsteady on her feet and does not walk much.  Her atherosclerotic risk factors include 50+ year history of smoking (quit in 2011), stage 3 CKD, CAD, and advanced age.  Fortunately she does not have DM.  She takes a daily statin and ASA.   DATA  Today's carotid duplex suggests no significant stenosis of the bilateral CCA. >50% stenosis in the left proximal ECA. The antegrade right vertebral artery demonstrates early deceleration.  Velocities of 121 cm/s and 290 cm/s in the right and left proximal subclavian arteries, respectively. Right ICA (CEA site) with no restenosis. Left ICA with 60-79% (high end of range) stenosis, based on limited visualization due to acoustic plaque shadowing.  No significant change compared to the prior exam on 05-18-16.   ABI (06-11-14) Right: 0.62 (0.82, 05-12-13), waveforms: monophasic; TBI: 0.42 Left: 0.52 (0.53, 05-12-13), waveforms: monophasic; TBI: 0.32   Plan:  Daily seated leg exercises discussed with pt and daughter and demonstrated.   Follow-up in 6 months with Carotid Duplex scan.   I discussed in depth with the patient the nature of atherosclerosis, and emphasized the importance of maximal medical management including strict control of blood pressure, blood glucose, and lipid levels, obtaining regular exercise, and continued cessation of smoking.  The patient is aware that without maximal medical management the underlying atherosclerotic disease process will progress, limiting  the benefit of any interventions. The patient was given information about stroke prevention and what symptoms should prompt the patient to seek immediate medical care. Thank you for allowing [place] to participate in this patient's care.  Charisse March, RN, MSN, FNP-C [facility] Office: [phone]  Clinic Physician: Myra Gianotti  12/14/16 1:48 PM

## 2016-12-14 NOTE — Patient Instructions (Addendum)
Stroke Prevention Some medical conditions and behaviors are associated with an increased chance of having a stroke. You may prevent a stroke by making healthy choices and managing medical conditions. How can I reduce my risk of having a stroke?  Stay physically active. Get at least 30 minutes of activity on most or all days.  Do not smoke. It may also be helpful to avoid exposure to secondhand smoke.  Limit alcohol use. Moderate alcohol use is considered to be:  No more than 2 drinks per day for men.  No more than 1 drink per day for nonpregnant women.  Eat healthy foods. This involves:  Eating 5 or more servings of fruits and vegetables a day.  Making dietary changes that address high blood pressure (hypertension), high cholesterol, diabetes, or obesity.  Manage your cholesterol levels.  Making food choices that are high in fiber and low in saturated fat, trans fat, and cholesterol may control cholesterol levels.  Take any prescribed medicines to control cholesterol as directed by your health care provider.  Manage your diabetes.  Controlling your carbohydrate and sugar intake is recommended to manage diabetes.  Take any prescribed medicines to control diabetes as directed by your health care provider.  Control your hypertension.  Making food choices that are low in salt (sodium), saturated fat, trans fat, and cholesterol is recommended to manage hypertension.  Ask your health care provider if you need treatment to lower your blood pressure. Take any prescribed medicines to control hypertension as directed by your health care provider.  If you are 18-39 years of age, have your blood pressure checked every 3-5 years. If you are 40 years of age or older, have your blood pressure checked every year.  Maintain a healthy weight.  Reducing calorie intake and making food choices that are low in sodium, saturated fat, trans fat, and cholesterol are recommended to manage  weight.  Stop drug abuse.  Avoid taking birth control pills.  Talk to your health care provider about the risks of taking birth control pills if you are over 35 years old, smoke, get migraines, or have ever had a blood clot.  Get evaluated for sleep disorders (sleep apnea).  Talk to your health care provider about getting a sleep evaluation if you snore a lot or have excessive sleepiness.  Take medicines only as directed by your health care provider.  For some people, aspirin or blood thinners (anticoagulants) are helpful in reducing the risk of forming abnormal blood clots that can lead to stroke. If you have the irregular heart rhythm of atrial fibrillation, you should be on a blood thinner unless there is a good reason you cannot take them.  Understand all your medicine instructions.  Make sure that other conditions (such as anemia or atherosclerosis) are addressed. Get help right away if:  You have sudden weakness or numbness of the face, arm, or leg, especially on one side of the body.  Your face or eyelid droops to one side.  You have sudden confusion.  You have trouble speaking (aphasia) or understanding.  You have sudden trouble seeing in one or both eyes.  You have sudden trouble walking.  You have dizziness.  You have a loss of balance or coordination.  You have a sudden, severe headache with no known cause.  You have new chest pain or an irregular heartbeat. Any of these symptoms may represent a serious problem that is an emergency. Do not wait to see if the symptoms will go away.   Get medical help at once. Call your local emergency services (911 in [place] Do not drive yourself to the hospital. This information is not intended to replace advice given to you by your health care provider. Make sure you discuss any questions you have with your health care provider. Document Released: 09/17/2004 Document Revised: 01/16/2016 Document Reviewed: 02/10/2013 Elsevier  Interactive Patient Education  2017 Elsevier Inc.     Preventing Cerebrovascular Disease Arteries are blood vessels that carry blood that contains oxygen from the heart to all parts of the body. Cerebrovascular disease affects arteries that supply the brain. Any condition that blocks or disrupts blood flow to the brain can cause cerebrovascular disease. Brain cells that lose blood supply start to die within minutes (stroke). Stroke is the main danger of cerebrovascular disease. Atherosclerosis and high blood pressure are common causes of cerebrovascular disease. Atherosclerosis is narrowing and hardening of an artery that results when fat, cholesterol, calcium, or other substances (plaque) build up inside an artery. Plaque reduces blood flow through the artery. High blood pressure increases the risk of bleeding inside the brain. Making diet and lifestyle changes to prevent atherosclerosis and high blood pressure lowers your risk of cerebrovascular disease. What nutrition changes can be made?  Eat more fruits, vegetables, and whole grains.  Reduce how much saturated fat you eat. To do this, eat less red meat and fewer full-fat dairy products.  Eat healthy proteins instead of red meat. Healthy proteins include:  Fish. Eat fish that contains heart-healthy omega-3 fatty acids, twice a week. Examples include salmon, albacore tuna, mackerel, and herring.  Chicken.  Nuts.  Low-fat or nonfat yogurt.  Avoid processed meats, like bacon and lunchmeat.  Avoid foods that contain:  A lot of sugar, such as sweets and drinks with added sugar.  A lot of salt (sodium). Avoid adding extra salt to your food, as told by your health care provider.  Trans fats, such as margarine and baked goods. Trans fats may be listed as "partially hydrogenated oils" on food labels.  Check food labels to see how much sodium, sugar, and trans fats are in foods.  Use vegetable oils that contain low amounts of  saturated fat, such as olive oil or canola oil. What lifestyle changes can be made?  Drink alcohol in moderation. This means no more than 1 drink a day for nonpregnant women and 2 drinks a day for men. One drink equals 12 oz of beer, 5 oz of wine, or 1 oz of hard liquor.  If you are overweight, ask your health care provider to recommend a weight-loss plan for you. Losing 5-10 lb (2.2-4.5 kg) can reduce your risk of diabetes, atherosclerosis, and high blood pressure.  Exercise for 30?60 minutes on most days, or as much as told by your health care provider.  Do moderate-intensity exercise, such as brisk walking, bicycling, and water aerobics. Ask your health care provider which activities are safe for you.  Do not use any products that contain nicotine or tobacco, such as cigarettes and e-cigarettes. If you need help quitting, ask your health care provider. Why are these changes important? Making these changes lowers your risk of many diseases that can cause cerebrovascular disease and stroke. Stroke is a leading cause of death and disability. Making these changes also improves your overall health and quality of life. What can I do to lower my risk? The following factors make you more likely to develop cerebrovascular disease:  Being overweight.  Smoking.  Being physically inactive.    Eating a high-fat diet.  Having certain health conditions, such as:  Diabetes.  High blood pressure.  Heart disease.  Atherosclerosis.  High cholesterol.  Sickle cell disease. Talk with your health care provider about your risk for cerebrovascular disease. Work with your health care provider to control diseases that you have that may contribute to cerebrovascular disease. Your health care provider may prescribe medicines to help prevent major causes of cerebrovascular disease. Where to find more information: Learn more about preventing cerebrovascular disease from:  National Heart, Lung, and  Blood Institute: www.nhlbi.nih.gov/health/health-topics/topics/stroke  Centers for Disease Control and Prevention: cdc.gov/stroke/about.htm Summary  Cerebrovascular disease can lead to a stroke.  Atherosclerosis and high blood pressure are major causes of cerebrovascular disease.  Making diet and lifestyle changes can reduce your risk of cerebrovascular disease.  Work with your health care provider to get your risk factors under control to reduce your risk of cerebrovascular disease. This information is not intended to replace advice given to you by your health care provider. Make sure you discuss any questions you have with your health care provider. Document Released: 08/25/2015 Document Revised: 02/28/2016 Document Reviewed: 08/25/2015 Elsevier Interactive Patient Education  2017 Elsevier Inc.      Peripheral Vascular Disease Peripheral vascular disease (PVD) is a disease of the blood vessels that are not part of your heart and brain. A simple term for PVD is poor circulation. In most cases, PVD narrows the blood vessels that carry blood from your heart to the rest of your body. This can result in a decreased supply of blood to your arms, legs, and internal organs, like your stomach or kidneys. However, it most often affects a person's lower legs and feet. There are two types of PVD.  Organic PVD. This is the more common type. It is caused by damage to the structure of blood vessels.  Functional PVD. This is caused by conditions that make blood vessels contract and tighten (spasm). Without treatment, PVD tends to get worse over time. PVD can also lead to acute ischemic limb. This is when an arm or limb suddenly has trouble getting enough blood. This is a medical emergency. Follow these instructions at home:  Take medicines only as told by your doctor.  Do not use any tobacco products, including cigarettes, chewing tobacco, or electronic cigarettes. If you need help quitting, ask  your doctor.  Lose weight if you are overweight, and maintain a healthy weight as told by your doctor.  Eat a diet that is low in fat and cholesterol. If you need help, ask your doctor.  Exercise regularly. Ask your doctor for some good activities for you.  Take good care of your feet.  Wear comfortable shoes that fit well.  Check your feet often for any cuts or sores. Contact a doctor if:  You have cramps in your legs while walking.  You have leg pain when you are at rest.  You have coldness in a leg or foot.  Your skin changes.  You are unable to get or have an erection (erectile dysfunction).  You have cuts or sores on your feet that are not healing. Get help right away if:  Your arm or leg turns cold and blue.  Your arms or legs become red, warm, swollen, painful, or numb.  You have chest pain or trouble breathing.  You suddenly have weakness in your face, arm, or leg.  You become very confused or you cannot speak.  You suddenly have a very bad headache.    You suddenly cannot see. This information is not intended to replace advice given to you by your health care provider. Make sure you discuss any questions you have with your health care provider. Document Released: 11/04/2009 Document Revised: 01/16/2016 Document Reviewed: 01/18/2014 Elsevier Interactive Patient Education  2017 Elsevier Inc.  

## 2016-12-21 ENCOUNTER — Ambulatory Visit
Admission: RE | Admit: 2016-12-21 | Discharge: 2016-12-21 | Disposition: A | Discharge status: Home / Self Care | Payer: Medicare Other | Source: Ambulatory Visit | Attending: Internal Medicine | Admitting: Internal Medicine

## 2016-12-21 ENCOUNTER — Ambulatory Visit (INDEPENDENT_AMBULATORY_CARE_PROVIDER_SITE_OTHER): Payer: Medicare Other | Admitting: Internal Medicine

## 2016-12-21 ENCOUNTER — Encounter: Payer: Self-pay | Admitting: Internal Medicine

## 2016-12-21 VITALS — BP 162/54 | HR 72 | Ht 67.0 in | Wt 127.1 lb

## 2016-12-21 DIAGNOSIS — R0602 Shortness of breath: Secondary | ICD-10-CM

## 2016-12-21 DIAGNOSIS — D510 Vitamin B12 deficiency anemia due to intrinsic factor deficiency: Secondary | ICD-10-CM | POA: Diagnosis not present

## 2016-12-21 DIAGNOSIS — J90 Pleural effusion, not elsewhere classified: Secondary | ICD-10-CM

## 2016-12-21 NOTE — Patient Instructions (Signed)
Your physician recommends that you continue on your current medications as directed. Please refer to the Current Medication list given to you today.  Your physician recommends that you return for lab work today (TSH, BMET, BNP)  A chest x-ray takes a picture of the organs and structures inside the chest, including the heart, lungs, and blood vessels. This test can show several things, including, whether the heart is enlarges; whether fluid is building up in the lungs; and whether pacemaker / defibrillator leads are still in place.

## 2016-12-21 NOTE — Progress Notes (Signed)
Cardiology Office Note   Date:  12/21/2016   ID:  [name], DOB 1931-01-20, MRN [id number]  PCP:  Duane Lope, MD  Cardiologist:   Dietrich Pates, MD   Pt returns for eval of pleural effusion     History of Present Illness: [name] is a 83 y.o. female with a history of HTN and CV dz   I saw her in Jan.  At that time she was having problems with upper resp sympotms Pt had a CT scan of chest 2/8  This showed a mod L sided, small R sided effusion Echo showed vigorous LV function  She eventually underwent thoracentesis of effusion  Fluid was transudative Since procedure she had initial improvement in breathing but now notes increased SOB, similar to before    She denies fevers, chills or prod cough       Current Meds  Medication Sig  . amLODipine (NORVASC) 5 MG tablet TAKE ONE TABLET BY MOUTH TWICE DAILY  . aspirin 81 MG tablet Take 81 mg by mouth daily.    Marland Kitchen atorvastatin (LIPITOR) 20 MG tablet TAKE 1 TABLET (20 MG TOTAL) BY MOUTH DAILY.  Marland Kitchen CALCIUM PO Take 1 tablet by mouth daily.  . carvedilol (COREG) 3.125 MG tablet TAKE 1 TABLET (3.125 MG TOTAL) BY MOUTH DAILY.  . cholecalciferol (VITAMIN D) 1000 UNITS tablet Take 1,000 Units by mouth daily.  . clopidogrel (PLAVIX) 75 MG tablet TAKE ONE TABLET BY MOUTH DAILY WITH BREAKFAST  . fluticasone (FLONASE) 50 MCG/ACT nasal spray Place 1 spray into both nostrils daily.  . hydrALAZINE (APRESOLINE) 25 MG tablet Take 2 tablets (50 mg total) by mouth 3 (three) times daily.  Marland Kitchen lisinopril (PRINIVIL,ZESTRIL) 40 MG tablet TAKE ONE TABLET BY MOUTH DAILY  . Multiple Vitamins-Minerals (PRESERVISION AREDS) CAPS Take 1 capsule by mouth daily.      Allergies:   Penicillins; Clonidine derivatives; and Maxzide [hydrochlorothiazide w-triamterene]   Past Medical History:  Diagnosis Date  . Anemia    a. "On/off my whole life" - etiology unclear. Has never required blood transfusion.  . Anemia associated with stage 3 chronic renal failure  06/02/2016  . Arthritis   . CAD (coronary artery disease)   . Carotid artery occlusion    RICA 80-99%, LICA 40-59% by dopplers 06/2013. Also with cerebrovascular disease otherwise on MRA 06/2013.  . Carotid artery stenosis   . Chronic kidney disease   . Chronic renal insufficiency, stage 3 (moderate) 06/02/2016  . Depression   . Dizziness    a. Adm 06/2013: felt due to posterior circulation deficits from vertebrobasilar insufficiency and possibly vertigo.  Marland Kitchen DVT (deep venous thrombosis) (HCC)    a. Around age 59, details unclear (SVT vs DVT?), s/p vein stripping without recurrence.  . Erythropoietin deficiency anemia 06/02/2016  . Heart murmur    a. Longstanding. mild MR on prior ECG, also may be in setting of known vasc dz.  . Hiatal hernia   . Hyperlipidemia   . Hypertension    a. Intolerant to Maxzide due to hyponatremia. b. intolerant to Clonidine due to slow HR/fatigue. c. Discrepancy in BP felt due to innominant stenosis.  . Iron deficiency anemia due to chronic blood loss 09/08/2016  . Iron malabsorption 09/08/2016  . Macular degeneration    In both eyes - no longer drives.  . Macular degeneration    L- macular degeneration, R eye impaired as well  . Multiple thyroid nodules    a. By [place] 06/2013. Bx  recommended.  . Pernicious anemia 06/02/2016  . PFO (patent foramen ovale)    a. Per echo in 12/2010.  Marland Kitchen PVD (peripheral vascular disease) (HCC)    a. R innominant stenosis. b. 06/2013 ABI: 0.80 R, 0.50 L., DVT- 1970's  . Varicose veins     Past Surgical History:  Procedure Laterality Date  . ABDOMINAL HYSTERECTOMY  1971  . APPENDECTOMY    . BLADDER SURGERY  2004   baldder sling  . CATARACT EXTRACTION    . CESAREAN SECTION     X1  . ENDARTERECTOMY Right 10/31/2015   Procedure: RIGHT CAROTID ENDARTERECTOMY;  Surgeon: Nada Libman, MD;  Location: [facility] OR;  Service: Vascular;  Laterality: Right;  . EYE SURGERY     cataracts removed, /w IOL  . INCONTINENCE SURGERY    . IR  THORACENTESIS ASP PLEURAL SPACE W/IMG GUIDE  11/23/2016  . LUMBAR DISC SURGERY    . Neck Muscle Surgery    . SPINE SURGERY    . TOE SURGERY Right    great toe with pins  . VARICOSE VEIN SURGERY     left leg     Social History:  The patient  reports that she quit smoking about 7 years ago. She has never used smokeless tobacco. She reports that she does not drink alcohol or use drugs.   Family History:  The patient's family history includes Cancer in her mother, sister, and sister; Diabetes in her sister and son; Heart attack in her sister; Hyperlipidemia in her daughter, sister, and sister; Hypertension in her daughter and sister.    ROS:  Please see the history of present illness. All other systems are reviewed and  Negative to the above problem except as noted.    PHYSICAL EXAM: VS:  BP (!) 162/54   Pulse 72   Ht  (1.702 m)   Wt 127 lb 1.9 oz (57.7 kg)   SpO2 94%   BMI 19.91 kg/m    O2 sat 96%  Decrease to 93% with walkng   Pt comfortable  GEN: Well nourished, well developed, in no acute distress  HEENT: normal  Neck: no JVD, carotid bruits, or masses Cardiac: RRR; no murmurs, rubs, or gallops,Tr edema  Respiratory:  Sl decreased BS at L base   normal work of breathing GI: soft, nontender, nondistended, + BS  No hepatomegaly  MS: no deformity Moving all extremities   Skin: warm and dry, no rash Neuro:  Strength and sensation are intact Psych: euthymic mood, full affect   EKG:  EKG is not  ordered today.   Lipid Panel    Component Value Date/Time   CHOL 137 12/12/2015 1449   CHOL 169 02/19/2014 1255   CHOL 133 02/17/2013 0947   TRIG 77 12/12/2015 1449   TRIG CANCELED 02/19/2014 1255   TRIG 65 02/17/2013 0947   HDL 57 12/12/2015 1449   HDL CANCELED 02/19/2014 1255   HDL 48 02/19/2014 1255   HDL 58 02/17/2013 0947   CHOLHDL 2.4 12/12/2015 1449   VLDL 15 12/12/2015 1449   LDLCALC 65 12/12/2015 1449   LDLCALC 97 02/19/2014 1255   LDLCALC 95 02/19/2014 0000    LDLCALC 62 02/17/2013 0947      Wt Readings from Last 3 Encounters:  12/21/16 127 lb 1.9 oz (57.7 kg)  12/14/16 129 lb (58.5 kg)  11/24/16 127 lb 6.4 oz (57.8 kg)      ASSESSMENT AND PLAN:  1  Pleural effusion  Exam and symptoms  sugg som recurrence  WIll recheck labs (BMET, BNP and TSH)and get CXR   2  HTN  BP OK  3  ANemia Pt followed in hematology clinic    4  CV dz  Follows with [facility]  F/U based on test results   Current medicines are reviewed at length with the patient today.  The patient does not have concerns regarding medicines.  Signed, Dietrich Pates, MD  12/21/2016 11:58 AM    [facility] [address] [place], [place], [place]  [address] Phone: [phone]; Fax: [phone]

## 2016-12-22 ENCOUNTER — Ambulatory Visit (HOSPITAL_BASED_OUTPATIENT_CLINIC_OR_DEPARTMENT_OTHER): Payer: Medicare Other | Admitting: Hematology & Oncology

## 2016-12-22 ENCOUNTER — Encounter: Payer: Self-pay | Admitting: *Deleted

## 2016-12-22 ENCOUNTER — Ambulatory Visit (HOSPITAL_BASED_OUTPATIENT_CLINIC_OR_DEPARTMENT_OTHER): Payer: Medicare Other

## 2016-12-22 ENCOUNTER — Other Ambulatory Visit (HOSPITAL_BASED_OUTPATIENT_CLINIC_OR_DEPARTMENT_OTHER): Payer: Medicare Other

## 2016-12-22 VITALS — BP 130/56 | HR 73 | Temp 97.8°F | Resp 19 | Wt 128.1 lb

## 2016-12-22 DIAGNOSIS — D631 Anemia in chronic kidney disease: Secondary | ICD-10-CM

## 2016-12-22 DIAGNOSIS — N183 Chronic kidney disease, stage 3 unspecified: Secondary | ICD-10-CM

## 2016-12-22 DIAGNOSIS — D500 Iron deficiency anemia secondary to blood loss (chronic): Secondary | ICD-10-CM

## 2016-12-22 DIAGNOSIS — D510 Vitamin B12 deficiency anemia due to intrinsic factor deficiency: Secondary | ICD-10-CM

## 2016-12-22 LAB — IRON AND TIBC
%SAT: 32 % (ref 21–57)
Iron: 68 ug/dL (ref 41–142)
TIBC: 213 ug/dL — ABNORMAL LOW (ref 236–444)
UIBC: 144 ug/dL (ref 120–384)

## 2016-12-22 LAB — BASIC METABOLIC PANEL
BUN / CREAT RATIO: 17 (ref 12–28)
BUN: 24 mg/dL (ref 8–27)
CO2: 20 mmol/L (ref 18–29)
CREATININE: 1.44 mg/dL — AB (ref 0.57–1.00)
Calcium: 8.1 mg/dL — ABNORMAL LOW (ref 8.7–10.3)
Chloride: 105 mmol/L (ref 96–106)
GFR, EST AFRICAN AMERICAN: 39 mL/min/{1.73_m2} — AB (ref >59)
GFR, EST NON AFRICAN AMERICAN: 34 mL/min/{1.73_m2} — AB (ref >59)
Glucose: 105 mg/dL — ABNORMAL HIGH (ref 65–99)
Potassium: 4.7 mmol/L (ref 3.5–5.2)
SODIUM: 139 mmol/L (ref 134–144)

## 2016-12-22 LAB — FERRITIN: Ferritin: 238 ng/ml (ref 9–269)

## 2016-12-22 LAB — CBC WITH DIFFERENTIAL (CANCER CENTER ONLY)
BASO#: 0.1 10*3/uL (ref 0.0–0.2)
BASO%: 1.7 % (ref 0.0–2.0)
EOS%: 2 % (ref 0.0–7.0)
Eosinophils Absolute: 0.2 10*3/uL (ref 0.0–0.5)
HCT: 31.3 % — ABNORMAL LOW (ref 34.8–46.6)
HEMOGLOBIN: 10.2 g/dL — AB (ref 11.6–15.9)
LYMPH#: 0.8 10*3/uL — AB (ref 0.9–3.3)
LYMPH%: 9.8 % — ABNORMAL LOW (ref 14.0–48.0)
MCH: 28 pg (ref 26.0–34.0)
MCHC: 32.6 g/dL (ref 32.0–36.0)
MCV: 86 fL (ref 81–101)
MONO#: 0.6 10*3/uL (ref 0.1–0.9)
MONO%: 7.3 % (ref 0.0–13.0)
NEUT#: 6.2 10*3/uL (ref 1.5–6.5)
NEUT%: 79.2 % (ref 39.6–80.0)
Platelets: 230 10*3/uL (ref 145–400)
RBC: 3.64 10*6/uL — ABNORMAL LOW (ref 3.70–5.32)
RDW: 18.8 % — AB (ref 11.1–15.7)
WBC: 7.8 10*3/uL (ref 3.9–10.0)

## 2016-12-22 LAB — COMPREHENSIVE METABOLIC PANEL
ALT: 13 U/L (ref 0–55)
AST: 19 U/L (ref 5–34)
Albumin: 2.7 g/dL — ABNORMAL LOW (ref 3.5–5.0)
Alkaline Phosphatase: 75 U/L (ref 40–150)
Anion Gap: 8 mEq/L (ref 3–11)
BUN: 26.1 mg/dL — AB (ref 7.0–26.0)
CHLORIDE: 112 meq/L — AB (ref 98–109)
CO2: 22 mEq/L (ref 22–29)
Calcium: 8.4 mg/dL (ref 8.4–10.4)
Creatinine: 1.5 mg/dL — ABNORMAL HIGH (ref 0.6–1.1)
EGFR: 31 mL/min/{1.73_m2} — ABNORMAL LOW (ref >90)
GLUCOSE: 114 mg/dL (ref 70–140)
POTASSIUM: 4.8 meq/L (ref 3.5–5.1)
SODIUM: 142 meq/L (ref 136–145)
Total Bilirubin: 0.38 mg/dL (ref 0.20–1.20)
Total Protein: 5.6 g/dL — ABNORMAL LOW (ref 6.4–8.3)

## 2016-12-22 LAB — TSH: TSH: 4.59 u[IU]/mL — ABNORMAL HIGH (ref 0.450–4.500)

## 2016-12-22 LAB — PRO B NATRIURETIC PEPTIDE: NT-PRO BNP: 13669 pg/mL — AB (ref 0–738)

## 2016-12-22 MED ORDER — DARBEPOETIN ALFA 300 MCG/0.6ML IJ SOSY
300.0000 ug | PREFILLED_SYRINGE | Freq: Once | INTRAMUSCULAR | Status: AC
  Administered 2016-12-22: 300 ug via SUBCUTANEOUS

## 2016-12-22 MED ORDER — CYANOCOBALAMIN 1000 MCG/ML IJ SOLN
INTRAMUSCULAR | Status: AC
  Filled 2016-12-22: qty 1

## 2016-12-22 MED ORDER — CYANOCOBALAMIN 1000 MCG/ML IJ SOLN
1000.0000 ug | Freq: Once | INTRAMUSCULAR | Status: AC
  Administered 2016-12-22: 1000 ug via INTRAMUSCULAR

## 2016-12-22 MED ORDER — DARBEPOETIN ALFA 300 MCG/0.6ML IJ SOSY
PREFILLED_SYRINGE | INTRAMUSCULAR | Status: AC
  Filled 2016-12-22: qty 0.6

## 2016-12-22 MED ORDER — DARBEPOETIN ALFA 200 MCG/0.4ML IJ SOSY
PREFILLED_SYRINGE | INTRAMUSCULAR | Status: AC
Start: 2016-12-22 — End: 2016-12-22
  Filled 2016-12-22: qty 0.4

## 2016-12-22 NOTE — Progress Notes (Signed)
Hematology and Oncology Follow Up Visit  [name] [id number] 1931/03/11 83 y.o. 12/22/2016   Principle Diagnosis:  Anemia secondary to chronic renal insufficiency and erythropoietin deficiency  Iron def anemia Pernicious anemia   Current Therapy:   Aranesp 300 mcg SQ  q 3 weeks for Hgb < 11 Vit B 12 1000 mcg IM prn IV iron as needed    Interim History:  [name] is here today with her daughter for follow-up . She is feeling much more tired. Her hemoglobin is down. She is 10.2. We will go ahead and give her a dose of Aranesp today.   Her last iron studies back in early April showed a ferritin of 205 with iron saturation of 28%.   She's having more breathing difficulties. She gets short of breath. She does have worsening renal disease. I think that she also has cardiac disease.   She has had no problems with bleeding.   She has had no fever. She has had no rashes. She has had no nausea or vomiting.   Overall, her performance status is ECOG 2.   Medications:  Allergies as of 12/22/2016      Reactions   Penicillins Swelling, Rash   Has patient had a PCN reaction causing immediate rash, facial/tongue/throat swelling, SOB or lightheadedness with hypotension: Unknown Has patient had a PCN reaction causing severe rash involving mucus membranes or skin necrosis: No Has patient had a PCN reaction that required hospitalization No Has patient had a PCN reaction occurring within the last 10 years: No If all of the above answers are "NO", then may proceed with Cephalosporin use.   Clonidine Derivatives Other (See Comments)   Fatigue Bradycardia   Maxzide [hydrochlorothiazide W-triamterene] Other (See Comments)   hyponatremia      Medication List       Accurate as of 12/22/16 12:06 PM. Always use your most recent med list.          amLODipine 5 MG tablet Commonly known as:  NORVASC TAKE ONE TABLET BY MOUTH TWICE DAILY   aspirin 81 MG tablet Take 81 mg by mouth daily.   atorvastatin 20 MG tablet Commonly known as:  LIPITOR TAKE 1 TABLET (20 MG TOTAL) BY MOUTH DAILY.   CALCIUM PO Take 1 tablet by mouth daily.   carvedilol 3.125 MG tablet Commonly known as:  COREG TAKE 1 TABLET (3.125 MG TOTAL) BY MOUTH DAILY.   cholecalciferol 1000 units tablet Commonly known as:  VITAMIN D Take 1,000 Units by mouth daily.   clopidogrel 75 MG tablet Commonly known as:  PLAVIX TAKE ONE TABLET BY MOUTH DAILY WITH BREAKFAST   fluticasone 50 MCG/ACT nasal spray Commonly known as:  FLONASE Place 1 spray into both nostrils daily.   hydrALAZINE 25 MG tablet Commonly known as:  APRESOLINE Take 2 tablets (50 mg total) by mouth 3 (three) times daily.   lisinopril 40 MG tablet Commonly known as:  PRINIVIL,ZESTRIL TAKE ONE TABLET BY MOUTH DAILY   PRESERVISION AREDS Caps Take 1 capsule by mouth daily.       Allergies:  Allergies  Allergen Reactions  . Penicillins Swelling and Rash    Has patient had a PCN reaction causing immediate rash, facial/tongue/throat swelling, SOB or lightheadedness with hypotension: Unknown Has patient had a PCN reaction causing severe rash involving mucus membranes or skin necrosis: No Has patient had a PCN reaction that required hospitalization No Has patient had a PCN reaction occurring within the last 10 years: No If  all of the above answers are "NO", then may proceed with Cephalosporin use.   . Clonidine Derivatives Other (See Comments)    Fatigue Bradycardia   . Maxzide [Hydrochlorothiazide W-Triamterene] Other (See Comments)    hyponatremia    Past Medical History, Surgical history, Social history, and Family History were reviewed and updated.  Review of Systems: All other 10 point review of systems is negative.   Physical Exam:  weight is 128 lb 1.9 oz (58.1 kg). Her oral temperature is 97.8 F (36.6 C). Her blood pressure is 130/56 (abnormal) and her pulse is 73. Her respiration is 19 and oxygen saturation is 94%.     Wt Readings from Last 3 Encounters:  12/22/16 128 lb 1.9 oz (58.1 kg)  12/21/16 127 lb 1.9 oz (57.7 kg)  12/14/16 129 lb (58.5 kg)    Elderly white female in no obvious distress. Head and neck exam shows no ocular or oral lesions. Her conjunctiva are pale. She has no adenopathy in the neck. Thyroid is nonpalpable. Lungs are clear on the right side. Maybe some slight decrease on the left side. Cardiac exam regular rate and rhythm. She has a 1/6 systolic ejection murmur. Abdomen is soft and she has good bowel sounds. There is no fluid wave. There is no palpable liver or spleen tip. Examination shows no clubbing, cyanosis or edema. Neurological exam shows no focal neurological deficits.  Lab Results  Component Value Date   WBC 7.8 12/22/2016   HGB 10.2 (L) 12/22/2016   HCT 31.3 (L) 12/22/2016   MCV 86 12/22/2016   PLT 230 12/22/2016   Lab Results  Component Value Date   FERRITIN 205 11/24/2016   IRON 54 11/24/2016   TIBC 190 (L) 11/24/2016   UIBC 136 11/24/2016   IRONPCTSAT 28 11/24/2016   Lab Results  Component Value Date   RETICCTPCT 1.3 05/11/2014   RBC 3.64 (L) 12/22/2016   RETICCTABS 44.6 05/11/2014   No results found for: KPAFRELGTCHN, LAMBDASER, KAPLAMBRATIO No results found for: Loel L[facility] Lab Results  Component Value Date   [facility] Not Observed 06/01/2016     Chemistry      Component Value Date/Time   NA 139 12/21/2016 1230   NA 140 11/24/2016 1200   K 4.7 12/21/2016 1230   K 4.8 11/24/2016 1200   CL 105 12/21/2016 1230   CL 106 06/01/2016 1537   CO2 20 12/21/2016 1230   CO2 20 (L) 11/24/2016 1200   BUN 24 12/21/2016 1230   BUN 21.1 11/24/2016 1200   CREATININE 1.44 (H) 12/21/2016 1230   CREATININE 1.6 (H) 11/24/2016 1200      Component Value Date/Time   CALCIUM 8.1 (L) 12/21/2016 1230   CALCIUM 8.2 (L) 11/24/2016 1200   ALKPHOS 79 11/24/2016 1200   AST 18 11/24/2016 1200   ALT 9 11/24/2016 1200   BILITOT 0.38 11/24/2016 1200      Impression and Plan: [name] is an 83 yo white female multiple medical including chronic renal insufficiency and anemia. Her erythropoietin Is quite low.   We will have to see what her iron studies show.  We will also give a dose of vitamin B-12. Establish she is having some problems with B-12 absorption. She just cannot take oral vitamin B-12.  I will see her back in 3 more weeks. I want to make sure that we closely follow her so that her quality of life can improve.    Josph Macho, MD 5/1/201812:06 PM

## 2016-12-22 NOTE — Patient Instructions (Signed)
Darbepoetin Alfa injection What is this medicine? DARBEPOETIN ALFA (dar be POE e tin AL fa) helps your body make more red blood cells. It is used to treat anemia caused by chronic kidney failure and chemotherapy. This medicine may be used for other purposes; ask your health care provider or pharmacist if you have questions. COMMON BRAND NAME(S): Aranesp What should I tell my health care provider before I take this medicine? They need to know if you have any of these conditions: -blood clotting disorders or history of blood clots -cancer patient not on chemotherapy -cystic fibrosis -heart disease, such as angina, heart failure, or a history of a heart attack -hemoglobin level of 12 g/dL or greater -high blood pressure -low levels of folate, iron, or vitamin B12 -seizures -an unusual or allergic reaction to darbepoetin, erythropoietin, albumin, hamster proteins, latex, other medicines, foods, dyes, or preservatives -pregnant or trying to get pregnant -breast-feeding How should I use this medicine? This medicine is for injection into a vein or under the skin. It is usually given by a health care professional in a hospital or clinic setting. If you get this medicine at home, you will be taught how to prepare and give this medicine. Use exactly as directed. Take your medicine at regular intervals. Do not take your medicine more often than directed. It is important that you put your used needles and syringes in a special sharps container. Do not put them in a trash can. If you do not have a sharps container, call your pharmacist or healthcare provider to get one. A special MedGuide will be given to you by the pharmacist with each prescription and refill. Be sure to read this information carefully each time. Talk to your pediatrician regarding the use of this medicine in children. While this medicine may be used in children as young as 1 year for selected conditions, precautions do  apply. Overdosage: If you think you have taken too much of this medicine contact a poison control center or emergency room at once. NOTE: This medicine is only for you. Do not share this medicine with others. What if I miss a dose? If you miss a dose, take it as soon as you can. If it is almost time for your next dose, take only that dose. Do not take double or extra doses. What may interact with this medicine? Do not take this medicine with any of the following medications: -epoetin alfa This list may not describe all possible interactions. Give your health care provider a list of all the medicines, herbs, non-prescription drugs, or dietary supplements you use. Also tell them if you smoke, drink alcohol, or use illegal drugs. Some items may interact with your medicine. What should I watch for while using this medicine? Your condition will be monitored carefully while you are receiving this medicine. You may need blood work done while you are taking this medicine. What side effects may I notice from receiving this medicine? Side effects that you should report to your doctor or health care professional as soon as possible: -allergic reactions like skin rash, itching or hives, swelling of the face, lips, or tongue -breathing problems -changes in vision -chest pain -confusion, trouble speaking or understanding -feeling faint or lightheaded, falls -high blood pressure -muscle aches or pains -pain, swelling, warmth in the leg -rapid weight gain -severe headaches -sudden numbness or weakness of the face, arm or leg -trouble walking, dizziness, loss of balance or coordination -seizures (convulsions) -swelling of the ankles, feet, hands -  unusually weak or tired Side effects that usually do not require medical attention (report to your doctor or health care professional if they continue or are bothersome): -diarrhea -fever, chills (flu-like symptoms) -headaches -nausea, vomiting -redness,  stinging, or swelling at site where injected This list may not describe all possible side effects. Call your doctor for medical advice about side effects. You may report side effects to FDA at 1-800-FDA-1088. Where should I keep my medicine? Keep out of the reach of children. Store in a refrigerator between 2 and 8 degrees C (36 and 46 degrees F). Do not freeze. Do not shake. Throw away any unused portion if using a single-dose vial. Throw away any unused medicine after the expiration date. NOTE: This sheet is a summary. It may not cover all possible information. If you have questions about this medicine, talk to your doctor, pharmacist, or health care provider.  2018 Elsevier/Gold Standard (2016-03-30 19:52:26) Cyanocobalamin, Vitamin B12 injection What is this medicine? CYANOCOBALAMIN (sye an oh koe BAL a min) is a man made form of vitamin B12. Vitamin B12 is used in the growth of healthy blood cells, nerve cells, and proteins in the body. It also helps with the metabolism of fats and carbohydrates. This medicine is used to treat people who can not absorb vitamin B12. This medicine may be used for other purposes; ask your health care provider or pharmacist if you have questions. COMMON BRAND NAME(S): B-12 Compliance Kit, B-12 Injection Kit, Cyomin, LA-12, Nutri-Twelve, Physicians EZ Use B-12, Primabalt What should I tell my health care provider before I take this medicine? They need to know if you have any of these conditions: -kidney disease -Leber's disease -megaloblastic anemia -an unusual or allergic reaction to cyanocobalamin, cobalt, other medicines, foods, dyes, or preservatives -pregnant or trying to get pregnant -breast-feeding How should I use this medicine? This medicine is injected into a muscle or deeply under the skin. It is usually given by a health care professional in a clinic or doctor's office. However, your doctor may teach you how to inject yourself. Follow all  instructions. Talk to your pediatrician regarding the use of this medicine in children. Special care may be needed. Overdosage: If you think you have taken too much of this medicine contact a poison control center or emergency room at once. NOTE: This medicine is only for you. Do not share this medicine with others. What if I miss a dose? If you are given your dose at a clinic or doctor's office, call to reschedule your appointment. If you give your own injections and you miss a dose, take it as soon as you can. If it is almost time for your next dose, take only that dose. Do not take double or extra doses. What may interact with this medicine? -colchicine -heavy alcohol intake This list may not describe all possible interactions. Give your health care provider a list of all the medicines, herbs, non-prescription drugs, or dietary supplements you use. Also tell them if you smoke, drink alcohol, or use illegal drugs. Some items may interact with your medicine. What should I watch for while using this medicine? Visit your doctor or health care professional regularly. You may need blood work done while you are taking this medicine. You may need to follow a special diet. Talk to your doctor. Limit your alcohol intake and avoid smoking to get the best benefit. What side effects may I notice from receiving this medicine? Side effects that you should report to your doctor  or health care professional as soon as possible: -allergic reactions like skin rash, itching or hives, swelling of the face, lips, or tongue -blue tint to skin -chest tightness, pain -difficulty breathing, wheezing -dizziness -red, swollen painful area on the leg Side effects that usually do not require medical attention (report to your doctor or health care professional if they continue or are bothersome): -diarrhea -headache This list may not describe all possible side effects. Call your doctor for medical advice about side  effects. You may report side effects to FDA at 1-800-FDA-1088. Where should I keep my medicine? Keep out of the reach of children. Store at room temperature between 15 and 30 degrees C (59 and 85 degrees F). Protect from light. Throw away any unused medicine after the expiration date. NOTE: This sheet is a summary. It may not cover all possible information. If you have questions about this medicine, talk to your doctor, pharmacist, or health care provider.  2018 Elsevier/Gold Standard (2007-11-21 22:10:20)

## 2016-12-23 ENCOUNTER — Other Ambulatory Visit: Payer: Self-pay | Admitting: *Deleted

## 2016-12-23 DIAGNOSIS — R0602 Shortness of breath: Secondary | ICD-10-CM

## 2016-12-23 DIAGNOSIS — J90 Pleural effusion, not elsewhere classified: Secondary | ICD-10-CM

## 2016-12-23 LAB — VITAMIN B12: VITAMIN B 12: 324 pg/mL (ref 232–1245)

## 2016-12-23 MED ORDER — FUROSEMIDE 20 MG PO TABS: 60.0000 mg | ORAL_TABLET | Freq: Every day | ORAL | 3 refills | Status: DC

## 2016-12-23 MED ORDER — POTASSIUM CHLORIDE CRYS ER 10 MEQ PO TBCR: 10.0000 meq | EXTENDED_RELEASE_TABLET | Freq: Every day | ORAL | 3 refills | Status: DC

## 2016-12-24 ENCOUNTER — Telehealth: Payer: Self-pay | Admitting: Internal Medicine

## 2016-12-24 NOTE — Telephone Encounter (Signed)
Patient daughter [name](Heidi) calling with a question about the dosage for the furosemide medication. She states that the instructions they were given in the office, conflicts with the instructions on the bottle. Please call, thanks.

## 2016-12-24 NOTE — Telephone Encounter (Signed)
Spoke with patient's daughter and clarified the dose instructions for lasix.  She knows patient is to take 60 mg once daily. She will come as planned on Thursday for lab work.

## 2016-12-31 ENCOUNTER — Other Ambulatory Visit: Payer: Self-pay

## 2016-12-31 ENCOUNTER — Other Ambulatory Visit: Payer: Medicare Other | Admitting: *Deleted

## 2016-12-31 DIAGNOSIS — J90 Pleural effusion, not elsewhere classified: Secondary | ICD-10-CM

## 2016-12-31 DIAGNOSIS — R0602 Shortness of breath: Secondary | ICD-10-CM

## 2016-12-31 LAB — BASIC METABOLIC PANEL
BUN/Creatinine Ratio: 19 (ref 12–28)
BUN: 33 mg/dL — AB (ref 8–27)
CALCIUM: 8.5 mg/dL — AB (ref 8.7–10.3)
CHLORIDE: 98 mmol/L (ref 96–106)
CO2: 24 mmol/L (ref 18–29)
Creatinine, Ser: 1.7 mg/dL — ABNORMAL HIGH (ref 0.57–1.00)
GFR calc non Af Amer: 27 mL/min/{1.73_m2} — ABNORMAL LOW (ref >59)
GFR, EST AFRICAN AMERICAN: 32 mL/min/{1.73_m2} — AB (ref >59)
Glucose: 109 mg/dL — ABNORMAL HIGH (ref 65–99)
Potassium: 4.3 mmol/L (ref 3.5–5.2)
Sodium: 140 mmol/L (ref 134–144)

## 2016-12-31 LAB — PRO B NATRIURETIC PEPTIDE: NT-Pro BNP: 11403 pg/mL — ABNORMAL HIGH (ref 0–738)

## 2016-12-31 MED ORDER — HYDRALAZINE HCL 25 MG PO TABS: 50.0000 mg | ORAL_TABLET | Freq: Three times a day (TID) | ORAL | 2 refills | Status: AC

## 2017-01-11 ENCOUNTER — Other Ambulatory Visit: Payer: Self-pay | Admitting: *Deleted

## 2017-01-11 DIAGNOSIS — J90 Pleural effusion, not elsewhere classified: Secondary | ICD-10-CM

## 2017-01-11 DIAGNOSIS — R0602 Shortness of breath: Secondary | ICD-10-CM

## 2017-01-12 ENCOUNTER — Telehealth: Payer: Self-pay | Admitting: Internal Medicine

## 2017-01-12 ENCOUNTER — Encounter: Payer: Self-pay | Admitting: *Deleted

## 2017-01-12 ENCOUNTER — Other Ambulatory Visit (HOSPITAL_BASED_OUTPATIENT_CLINIC_OR_DEPARTMENT_OTHER): Payer: Medicare Other

## 2017-01-12 ENCOUNTER — Ambulatory Visit (HOSPITAL_BASED_OUTPATIENT_CLINIC_OR_DEPARTMENT_OTHER): Payer: Medicare Other | Admitting: Hematology & Oncology

## 2017-01-12 ENCOUNTER — Ambulatory Visit: Payer: Medicare Other

## 2017-01-12 VITALS — BP 162/51 | HR 56 | Temp 97.9°F | Resp 16 | Wt 114.8 lb

## 2017-01-12 DIAGNOSIS — D510 Vitamin B12 deficiency anemia due to intrinsic factor deficiency: Secondary | ICD-10-CM

## 2017-01-12 DIAGNOSIS — D631 Anemia in chronic kidney disease: Secondary | ICD-10-CM

## 2017-01-12 DIAGNOSIS — D500 Iron deficiency anemia secondary to blood loss (chronic): Secondary | ICD-10-CM

## 2017-01-12 DIAGNOSIS — N189 Chronic kidney disease, unspecified: Secondary | ICD-10-CM

## 2017-01-12 DIAGNOSIS — N183 Chronic kidney disease, stage 3 unspecified: Secondary | ICD-10-CM

## 2017-01-12 LAB — CMP (CANCER CENTER ONLY)
ALT: 18 U/L (ref 10–47)
AST: 26 U/L (ref 11–38)
Albumin: 2.6 g/dL — ABNORMAL LOW (ref 3.3–5.5)
Alkaline Phosphatase: 64 U/L (ref 26–84)
BILIRUBIN TOTAL: 0.7 mg/dL (ref 0.20–1.60)
BUN: 50 mg/dL — AB (ref 7–22)
CALCIUM: 8.4 mg/dL (ref 8.0–10.3)
CO2: 27 mEq/L (ref 18–33)
Chloride: 102 mEq/L (ref 98–108)
Creat: 2.4 mg/dl — ABNORMAL HIGH (ref 0.6–1.2)
Glucose, Bld: 171 mg/dL — ABNORMAL HIGH (ref 73–118)
POTASSIUM: 4.6 meq/L (ref 3.3–4.7)
Sodium: 138 mEq/L (ref 128–145)
Total Protein: 5.4 g/dL — ABNORMAL LOW (ref 6.4–8.1)

## 2017-01-12 LAB — CBC WITH DIFFERENTIAL (CANCER CENTER ONLY)
BASO#: 0.1 10*3/uL (ref 0.0–0.2)
BASO%: 1.8 % (ref 0.0–2.0)
EOS%: 2.1 % (ref 0.0–7.0)
Eosinophils Absolute: 0.1 10*3/uL (ref 0.0–0.5)
HEMATOCRIT: 35.4 % (ref 34.8–46.6)
HGB: 11.4 g/dL — ABNORMAL LOW (ref 11.6–15.9)
LYMPH#: 0.7 10*3/uL — AB (ref 0.9–3.3)
LYMPH%: 11.6 % — ABNORMAL LOW (ref 14.0–48.0)
MCH: 28.9 pg (ref 26.0–34.0)
MCHC: 32.2 g/dL (ref 32.0–36.0)
MCV: 90 fL (ref 81–101)
MONO#: 0.4 10*3/uL (ref 0.1–0.9)
MONO%: 7.5 % (ref 0.0–13.0)
NEUT#: 4.3 10*3/uL (ref 1.5–6.5)
NEUT%: 77 % (ref 39.6–80.0)
Platelets: 221 10*3/uL (ref 145–400)
RBC: 3.95 10*6/uL (ref 3.70–5.32)
RDW: 17.3 % — ABNORMAL HIGH (ref 11.1–15.7)
WBC: 5.6 10*3/uL (ref 3.9–10.0)

## 2017-01-12 LAB — FERRITIN: FERRITIN: 90 ng/mL (ref 9–269)

## 2017-01-12 LAB — IRON AND TIBC
%SAT: 26 % (ref 21–57)
Iron: 61 ug/dL (ref 41–142)
TIBC: 229 ug/dL — AB (ref 236–444)
UIBC: 168 ug/dL (ref 120–384)

## 2017-01-12 NOTE — Progress Notes (Signed)
Hematology and Oncology Follow Up Visit  [name] [id number] June 20, 1931 83 y.o. 01/12/2017   Principle Diagnosis:  Anemia secondary to chronic renal insufficiency and erythropoietin deficiency  Iron def anemia Pernicious anemia   Current Therapy:   Aranesp 300 mcg SQ  q 3 weeks for Hgb < 11 Vit B 12 1000 mcg IM prn IV iron as needed    Interim History:  [name] is here today with her daughter for follow-up . She is feeling better. We gave her some Aranesp we saw her last appointment.  Only last saw her, her iron studies looked okay. Her ferritin was 238 with iron saturation of 32%.  Her main complaint has been frequent urination. She has dropped a few pounds since we saw. She is on diuretics. We are checking her potassium level today.  She's had no fever. She's had no bleeding. She has had no rashes.   Overall, her performance status is ECOG 2.   Medications:  Allergies as of 01/12/2017      Reactions   Penicillins Swelling, Rash   Has patient had a PCN reaction causing immediate rash, facial/tongue/throat swelling, SOB or lightheadedness with hypotension: Unknown Has patient had a PCN reaction causing severe rash involving mucus membranes or skin necrosis: No Has patient had a PCN reaction that required hospitalization No Has patient had a PCN reaction occurring within the last 10 years: No If all of the above answers are "NO", then may proceed with Cephalosporin use.   Clonidine Derivatives Other (See Comments)   Fatigue Bradycardia   Maxzide [hydrochlorothiazide W-triamterene] Other (See Comments)   hyponatremia      Medication List       Accurate as of 01/12/17  8:28 AM. Always use your most recent med list.          amLODipine 5 MG tablet Commonly known as:  NORVASC TAKE ONE TABLET BY MOUTH TWICE DAILY   aspirin 81 MG tablet Take 81 mg by mouth daily.   atorvastatin 20 MG tablet Commonly known as:  LIPITOR TAKE 1 TABLET (20 MG TOTAL) BY MOUTH  DAILY.   CALCIUM PO Take 1 tablet by mouth daily.   carvedilol 3.125 MG tablet Commonly known as:  COREG TAKE 1 TABLET (3.125 MG TOTAL) BY MOUTH DAILY.   cholecalciferol 1000 units tablet Commonly known as:  VITAMIN D Take 1,000 Units by mouth daily.   clopidogrel 75 MG tablet Commonly known as:  PLAVIX TAKE ONE TABLET BY MOUTH DAILY WITH BREAKFAST   fluticasone 50 MCG/ACT nasal spray Commonly known as:  FLONASE Place 1 spray into both nostrils daily.   furosemide 20 MG tablet Commonly known as:  LASIX Take 3 tablets (60 mg total) by mouth daily.   hydrALAZINE 25 MG tablet Commonly known as:  APRESOLINE Take 2 tablets (50 mg total) by mouth 3 (three) times daily.   lisinopril 40 MG tablet Commonly known as:  PRINIVIL,ZESTRIL TAKE ONE TABLET BY MOUTH DAILY   potassium chloride 10 MEQ tablet Commonly known as:  K-DUR,KLOR-CON Take 1 tablet (10 mEq total) by mouth daily.   PRESERVISION AREDS Caps Take 1 capsule by mouth daily.       Allergies:  Allergies  Allergen Reactions  . Penicillins Swelling and Rash    Has patient had a PCN reaction causing immediate rash, facial/tongue/throat swelling, SOB or lightheadedness with hypotension: Unknown Has patient had a PCN reaction causing severe rash involving mucus membranes or skin necrosis: No Has patient had a PCN  reaction that required hospitalization No Has patient had a PCN reaction occurring within the last 10 years: No If all of the above answers are "NO", then may proceed with Cephalosporin use.   . Clonidine Derivatives Other (See Comments)    Fatigue Bradycardia   . Maxzide [Hydrochlorothiazide W-Triamterene] Other (See Comments)    hyponatremia    Past Medical History, Surgical history, Social history, and Family History were reviewed and updated.  Review of Systems: All other 10 point review of systems is negative.   Physical Exam:  weight is 114 lb 12.8 oz (52.1 kg). Her oral temperature is 97.9  F (36.6 C). Her blood pressure is 162/51 (abnormal) and her pulse is 56 (abnormal). Her respiration is 16 and oxygen saturation is 95%.   Wt Readings from Last 3 Encounters:  01/12/17 114 lb 12.8 oz (52.1 kg)  12/22/16 128 lb 1.9 oz (58.1 kg)  12/21/16 127 lb 1.9 oz (57.7 kg)    Elderly white female in no obvious distress. Head and neck exam shows no ocular or oral lesions. Her conjunctiva are pale. She has no adenopathy in the neck. Thyroid is nonpalpable. Lungs are clear on the right side. Maybe some slight decrease on the left side. Cardiac exam regular rate and rhythm. She has a 1/6 systolic ejection murmur. Abdomen is soft and she has good bowel sounds. There is no fluid wave. There is no palpable liver or spleen tip. Examination shows no clubbing, cyanosis or edema. Neurological exam shows no focal neurological deficits.  Lab Results  Component Value Date   WBC 5.6 01/12/2017   HGB 11.4 (L) 01/12/2017   HCT 35.4 01/12/2017   MCV 90 01/12/2017   PLT 221 01/12/2017   Lab Results  Component Value Date   FERRITIN 238 12/22/2016   IRON 68 12/22/2016   TIBC 213 (L) 12/22/2016   UIBC 144 12/22/2016   IRONPCTSAT 32 12/22/2016   Lab Results  Component Value Date   RETICCTPCT 1.3 05/11/2014   RBC 3.95 01/12/2017   RETICCTABS 44.6 05/11/2014   No results found for: KPAFRELGTCHN, LAMBDASER, KAPLAMBRATIO No results found for: Loel Lofty [facility] Lab Results  Component Value Date   MSPIKE Not Observed 06/01/2016     Chemistry      Component Value Date/Time   NA 140 12/31/2016 1025   NA 142 12/22/2016 1106   K 4.3 12/31/2016 1025   K 4.8 12/22/2016 1106   CL 98 12/31/2016 1025   CL 106 06/01/2016 1537   CO2 24 12/31/2016 1025   CO2 22 12/22/2016 1106   BUN 33 (H) 12/31/2016 1025   BUN 26.1 (H) 12/22/2016 1106   CREATININE 1.70 (H) 12/31/2016 1025   CREATININE 1.5 (H) 12/22/2016 1106      Component Value Date/Time   CALCIUM 8.5 (L) 12/31/2016 1025   CALCIUM 8.4  12/22/2016 1106   ALKPHOS 75 12/22/2016 1106   AST 19 12/22/2016 1106   ALT 13 12/22/2016 1106   BILITOT 0.38 12/22/2016 1106     Impression and Plan: [name] is an 83 yo white female multiple medical including chronic renal insufficiency and anemia. Her erythropoietin Is quite low.   I'm glad that her hemoglobin has improved. This is no surprise. She actively responds quite well.  I think we can move her appointment out to 4 weeks now.  Josph Macho, MD 5/22/20188:28 AM

## 2017-01-12 NOTE — Telephone Encounter (Signed)
Called patient's daughter, Trudie BucklerHeidi ([place] back about her message. Patient's daughter is concerned about patient's weight lose and taking Lasix daily. Recent lab work was done today, no results in yet. Will forward to Dr. Tenny Crawoss to review and advise. Patient's daughter verbalized understanding.

## 2017-01-12 NOTE — Telephone Encounter (Signed)
Spoke to pt's daughter  Pt's breathing is great  She is not having any chest pains Wt is 114  Was 128 earlier this winter  Labs done by Dr Myna HidalgoEnnever  BUN/Cr increased I would recomm holding lasix for 2 days then resume every other day Labs (BMET and BNP) in 10 days  (Maybe day I am in clinic)

## 2017-01-12 NOTE — Telephone Encounter (Signed)
New message   Pt daughter states that pt weighs 114lbs and wants to make sure Dr. Tenny Crawoss looks at her blood work that she just recently had done. Pt daughter requests a call back.

## 2017-01-13 LAB — RETICULOCYTES: RETICULOCYTE COUNT: 1 % (ref 0.6–2.6)

## 2017-01-13 LAB — VITAMIN B12: Vitamin B12: 475 pg/mL (ref 232–1245)

## 2017-01-14 MED ORDER — POTASSIUM CHLORIDE CRYS ER 10 MEQ PO TBCR: EXTENDED_RELEASE_TABLET | ORAL | 3 refills | Status: DC

## 2017-01-14 MED ORDER — FUROSEMIDE 20 MG PO TABS: ORAL_TABLET | ORAL | 3 refills | Status: DC

## 2017-01-14 NOTE — Telephone Encounter (Signed)
Take potassium only on days of lasix.

## 2017-01-14 NOTE — Addendum Note (Signed)
Addended by: Lendon KaWILSON, Lorenzo Pereyra on: 01/14/2017 03:46 PM   Modules accepted: Orders

## 2017-01-14 NOTE — Telephone Encounter (Signed)
Called patient's daughter to schedule lab appointment for June 1.   Daughter asked about taking potassium only on lasix days.  Advised potassium was 4.6 with taking 60 mg of lasix daily and potassium 10 meq daily.  Advised since creatinine is 2.4, and potassium was 4.6, take potassium only on days she takes lasix.   Routing to Dr. Tenny Crawoss for review.

## 2017-01-22 ENCOUNTER — Other Ambulatory Visit: Payer: Medicare Other | Admitting: *Deleted

## 2017-01-22 DIAGNOSIS — J90 Pleural effusion, not elsewhere classified: Secondary | ICD-10-CM

## 2017-01-22 DIAGNOSIS — N183 Chronic kidney disease, stage 3 unspecified: Secondary | ICD-10-CM

## 2017-01-22 DIAGNOSIS — R0602 Shortness of breath: Secondary | ICD-10-CM

## 2017-01-22 NOTE — Progress Notes (Signed)
[id number] 

## 2017-01-23 LAB — BASIC METABOLIC PANEL
BUN/Creatinine Ratio: 20 (ref 12–28)
BUN: 44 mg/dL — AB (ref 8–27)
CALCIUM: 8.4 mg/dL — AB (ref 8.7–10.3)
CO2: 20 mmol/L (ref 18–29)
CREATININE: 2.16 mg/dL — AB (ref 0.57–1.00)
Chloride: 108 mmol/L — ABNORMAL HIGH (ref 96–106)
GFR calc non Af Amer: 21 mL/min/{1.73_m2} — ABNORMAL LOW (ref >59)
GFR, EST AFRICAN AMERICAN: 24 mL/min/{1.73_m2} — AB (ref >59)
Glucose: 107 mg/dL — ABNORMAL HIGH (ref 65–99)
POTASSIUM: 6 mmol/L — AB (ref 3.5–5.2)
Sodium: 142 mmol/L (ref 134–144)

## 2017-01-23 LAB — T4, FREE: Free T4: 1.2 ng/dL (ref 0.82–1.77)

## 2017-01-23 LAB — T3, FREE: T3, Free: 2.2 pg/mL (ref 2.0–4.4)

## 2017-01-23 LAB — TSH: TSH: 3.07 u[IU]/mL (ref 0.450–4.500)

## 2017-01-23 LAB — PRO B NATRIURETIC PEPTIDE: NT-Pro BNP: 11385 pg/mL — ABNORMAL HIGH (ref 0–738)

## 2017-01-24 ENCOUNTER — Other Ambulatory Visit: Payer: Self-pay | Admitting: Internal Medicine

## 2017-02-01 ENCOUNTER — Ambulatory Visit (INDEPENDENT_AMBULATORY_CARE_PROVIDER_SITE_OTHER): Payer: Medicare Other | Admitting: Internal Medicine

## 2017-02-01 ENCOUNTER — Encounter: Payer: Self-pay | Admitting: Internal Medicine

## 2017-02-01 VITALS — BP 130/58 | HR 59 | Ht 67.0 in | Wt 118.0 lb

## 2017-02-01 DIAGNOSIS — E782 Mixed hyperlipidemia: Secondary | ICD-10-CM | POA: Diagnosis not present

## 2017-02-01 DIAGNOSIS — I679 Cerebrovascular disease, unspecified: Secondary | ICD-10-CM | POA: Diagnosis not present

## 2017-02-01 DIAGNOSIS — R0602 Shortness of breath: Secondary | ICD-10-CM

## 2017-02-01 DIAGNOSIS — J90 Pleural effusion, not elsewhere classified: Secondary | ICD-10-CM

## 2017-02-01 NOTE — Patient Instructions (Addendum)
Your physician recommends that you continue on your current medications as directed. Please refer to the Current Medication list given to you today.  Your physician recommends that you return for lab work today (bmet, cbc, bnp)  Your physician wants you to follow-up in: Sept/Oct with Dr. Tenny Crawoss.  You will receive a reminder letter in the mail two months in advance. If you don't receive a letter, please call our office to schedule the follow-up appointment.

## 2017-02-01 NOTE — Progress Notes (Signed)
Cardiology Office Note   Date:  02/01/2017   ID:  [name], DOB 07-25-1931, MRN [id number]  PCP:  Daisy Floro, MD  Cardiologist:   Dietrich Pates, MD       History of Present Illness: [name] is a 83 y.o. female with a history of HTN, CVdz and pleural effusion (L) She has undergone thoracentesis for this in past  Transudative   Echo with vigorous LV function   Since she was in clinic she says she is breathing better  Breathing good when she does chores   No dizzienss  No CP         Current Meds  Medication Sig  . amLODipine (NORVASC) 5 MG tablet TAKE ONE TABLET BY MOUTH TWICE DAILY  . aspirin 81 MG tablet Take 81 mg by mouth daily.    Marland Kitchen atorvastatin (LIPITOR) 20 MG tablet TAKE 1 TABLET (20 MG TOTAL) BY MOUTH DAILY.  Marland Kitchen CALCIUM PO Take 1 tablet by mouth daily.  . carvedilol (COREG) 3.125 MG tablet TAKE 1 TABLET (3.125 MG TOTAL) BY MOUTH DAILY.  . cholecalciferol (VITAMIN D) 1000 UNITS tablet Take 1,000 Units by mouth daily.  . clopidogrel (PLAVIX) 75 MG tablet TAKE ONE TABLET BY MOUTH DAILY WITH BREAKFAST  . fluticasone (FLONASE) 50 MCG/ACT nasal spray Place 1 spray into both nostrils daily.  . furosemide (LASIX) 20 MG tablet Take 3 tablets (60 mg) every other day  . hydrALAZINE (APRESOLINE) 25 MG tablet Take 2 tablets (50 mg total) by mouth 3 (three) times daily.  Marland Kitchen lisinopril (PRINIVIL,ZESTRIL) 40 MG tablet TAKE ONE TABLET BY MOUTH DAILY  . Multiple Vitamins-Minerals (PRESERVISION AREDS) CAPS Take 1 capsule by mouth daily.   . potassium chloride (K-DUR,KLOR-CON) 10 MEQ tablet Take 1 tablet every other day.  Take with furosemide.     Allergies:   Penicillins; Clonidine derivatives; and Maxzide [hydrochlorothiazide w-triamterene]   Past Medical History:  Diagnosis Date  . Anemia    a. "On/off my whole life" - etiology unclear. Has never required blood transfusion.  . Anemia associated with stage 3 chronic renal failure 06/02/2016  . Arthritis   . CAD  (coronary artery disease)   . Carotid artery occlusion    RICA 80-99%, LICA 40-59% by dopplers 06/2013. Also with cerebrovascular disease otherwise on MRA 06/2013.  . Carotid artery stenosis   . Chronic kidney disease   . Chronic renal insufficiency, stage 3 (moderate) 06/02/2016  . Depression   . Dizziness    a. Adm 06/2013: felt due to posterior circulation deficits from vertebrobasilar insufficiency and possibly vertigo.  Marland Kitchen DVT (deep venous thrombosis) (HCC)    a. Around age 37, details unclear (SVT vs DVT?), s/p vein stripping without recurrence.  . Erythropoietin deficiency anemia 06/02/2016  . Heart murmur    a. Longstanding. mild MR on prior ECG, also may be in setting of known vasc dz.  . Hiatal hernia   . Hyperlipidemia   . Hypertension    a. Intolerant to Maxzide due to hyponatremia. b. intolerant to Clonidine due to slow HR/fatigue. c. Discrepancy in BP felt due to innominant stenosis.  . Iron deficiency anemia due to chronic blood loss 09/08/2016  . Iron malabsorption 09/08/2016  . Macular degeneration    In both eyes - no longer drives.  . Macular degeneration    L- macular degeneration, R eye impaired as well  . Multiple thyroid nodules    a. By [place] 06/2013. Bx recommended.  . Pernicious anemia  06/02/2016  . PFO (patent foramen ovale)    a. Per echo in 12/2010.  Marland Kitchen. PVD (peripheral vascular disease) (HCC)    a. R innominant stenosis. b. 06/2013 ABI: 0.80 R, 0.50 L., DVT- 1970's  . Varicose veins     Past Surgical History:  Procedure Laterality Date  . ABDOMINAL HYSTERECTOMY  1971  . APPENDECTOMY    . BLADDER SURGERY  2004   baldder sling  . CATARACT EXTRACTION    . CESAREAN SECTION     X1  . ENDARTERECTOMY Right 10/31/2015   Procedure: RIGHT CAROTID ENDARTERECTOMY;  Surgeon: Nada LibmanVance W Brabham, MD;  [facility];  Service: Vascular;  Laterality: Right;  . EYE SURGERY     cataracts removed, /w IOL  . INCONTINENCE SURGERY    . IR THORACENTESIS ASP PLEURAL SPACE  W/IMG GUIDE  11/23/2016  . LUMBAR DISC SURGERY    . Neck Muscle Surgery    . SPINE SURGERY    . TOE SURGERY Right    great toe with pins  . VARICOSE VEIN SURGERY     left leg     Social History:  The patient  reports that she quit smoking about 7 years ago. She has never used smokeless tobacco. She reports that she does not drink alcohol or use drugs.   Family History:  The patient's family history includes Cancer in her mother, sister, and sister; Diabetes in her sister and son; Heart attack in her sister; Hyperlipidemia in her daughter, sister, and sister; Hypertension in her daughter and sister.    ROS:  Please see the history of present illness. All other systems are reviewed and  Negative to the above problem except as noted.    PHYSICAL EXAM: VS:  BP (!) 130/58   Pulse (!) 59   Ht 5\' 7"  (1.702 m)   Wt 53.5 kg (118 lb)   SpO2 96%   BMI 18.48 kg/m   GEN: Well nourished, well developed, in no acute distress  HEENT: normal  Neck: no JVD, carotid bruits, or masses Cardiac: RRR; II/VI systolic murmur base  , rubs, or gallops,no edema  Respiratory:  clear to auscultation bilaterally, normal work of breathing  Minimal rles at bases   GI: soft, nontender, nondistended, + BS  No hepatomegaly  MS: no deformity Moving all extremities   Skin: warm and dry, no rash Neuro:  Strength and sensation are intact Psych: euthymic mood, full affect   EKG:  EKG is not ordered today.   Lipid Panel    Component Value Date/Time   CHOL 137 12/12/2015 1449   CHOL 169 02/19/2014 1255   CHOL 133 02/17/2013 0947   TRIG 77 12/12/2015 1449   TRIG CANCELED 02/19/2014 1255   TRIG 65 02/17/2013 0947   HDL 57 12/12/2015 1449   HDL CANCELED 02/19/2014 1255   HDL 48 02/19/2014 1255   HDL 58 02/17/2013 0947   CHOLHDL 2.4 12/12/2015 1449   VLDL 15 12/12/2015 1449   LDLCALC 65 12/12/2015 1449   LDLCALC 97 02/19/2014 1255   LDLCALC 95 02/19/2014 0000   LDLCALC 62 02/17/2013 0947      Wt  Readings from Last 3 Encounters:  02/01/17 53.5 kg (118 lb)  01/12/17 52.1 kg (114 lb 12.8 oz)  12/22/16 58.1 kg (128 lb 1.9 oz)      ASSESSMENT AND PLAN:  1  Pleural effusion  Exam sugg fluid if any is small  Breathing is good  I would recomm checking labs  Keep on same regimen  2  HTN  Adeuate control    3  CV dz  Followed by Janae Bridgeman  4  HL  Need to confirm lipitor use  Should be back on     Current medicines are reviewed at length with the patient today.  The patient does not have concerns regarding medicines.  Signed, Dietrich Pates, MD  02/01/2017 2:30 PM    [facility] [address] [place], [place], [place]  [address] Phone: [phone]; Fax: [phone]

## 2017-02-02 ENCOUNTER — Telehealth: Payer: Self-pay | Admitting: *Deleted

## 2017-02-02 LAB — BASIC METABOLIC PANEL
BUN/Creatinine Ratio: 21 (ref 12–28)
BUN: 43 mg/dL — ABNORMAL HIGH (ref 8–27)
CALCIUM: 8.7 mg/dL (ref 8.7–10.3)
CO2: 21 mmol/L (ref 20–29)
CREATININE: 2.05 mg/dL — AB (ref 0.57–1.00)
Chloride: 106 mmol/L (ref 96–106)
GFR calc Af Amer: 25 mL/min/{1.73_m2} — ABNORMAL LOW (ref >59)
GFR, EST NON AFRICAN AMERICAN: 22 mL/min/{1.73_m2} — AB (ref >59)
GLUCOSE: 94 mg/dL (ref 65–99)
POTASSIUM: 6.1 mmol/L — AB (ref 3.5–5.2)
SODIUM: 142 mmol/L (ref 134–144)

## 2017-02-02 LAB — CBC
HEMOGLOBIN: 11.1 g/dL (ref 11.1–15.9)
Hematocrit: 35 % (ref 34.0–46.6)
MCH: 28.5 pg (ref 26.6–33.0)
MCHC: 31.7 g/dL (ref 31.5–35.7)
MCV: 90 fL (ref 79–97)
Platelets: 228 10*3/uL (ref 150–379)
RBC: 3.89 x10E6/uL (ref 3.77–5.28)
RDW: 16.1 % — ABNORMAL HIGH (ref 12.3–15.4)
WBC: 8.3 10*3/uL (ref 3.4–10.8)

## 2017-02-02 LAB — PRO B NATRIURETIC PEPTIDE: NT-PRO BNP: 11969 pg/mL — AB (ref 0–738)

## 2017-02-02 NOTE — Telephone Encounter (Signed)
Potasium level drown on 6/11 is  6.1. 11 days ago it was 6.0 Dr. Elberta Fortisamnitz aware he recommends for pt to hold K-Dur 10 MEq. Pt to be follow by a nephrologist. I Spoke  With pt's daughter Trudie BucklerHeidi ,she states that she will hold pt's K-Dur 10 MEQ today, but she would like for Dr Tenny Crawoss to reviewed labs and make the recommendations instead.

## 2017-02-03 ENCOUNTER — Other Ambulatory Visit: Payer: Self-pay | Admitting: *Deleted

## 2017-02-03 DIAGNOSIS — N183 Chronic kidney disease, stage 3 unspecified: Secondary | ICD-10-CM

## 2017-02-03 DIAGNOSIS — E782 Mixed hyperlipidemia: Secondary | ICD-10-CM

## 2017-02-03 NOTE — Telephone Encounter (Signed)
SEE LAB RESULTS 02/01/17.

## 2017-02-04 ENCOUNTER — Other Ambulatory Visit: Payer: Medicare Other | Admitting: *Deleted

## 2017-02-04 DIAGNOSIS — E782 Mixed hyperlipidemia: Secondary | ICD-10-CM

## 2017-02-04 DIAGNOSIS — N183 Chronic kidney disease, stage 3 unspecified: Secondary | ICD-10-CM

## 2017-02-04 LAB — BASIC METABOLIC PANEL
BUN/Creatinine Ratio: 20 (ref 12–28)
BUN: 43 mg/dL — ABNORMAL HIGH (ref 8–27)
CO2: 17 mmol/L — AB (ref 20–29)
CREATININE: 2.16 mg/dL — AB (ref 0.57–1.00)
Calcium: 8.4 mg/dL — ABNORMAL LOW (ref 8.7–10.3)
Chloride: 110 mmol/L — ABNORMAL HIGH (ref 96–106)
GFR calc Af Amer: 24 mL/min/{1.73_m2} — ABNORMAL LOW (ref >59)
GFR calc non Af Amer: 21 mL/min/{1.73_m2} — ABNORMAL LOW (ref >59)
GLUCOSE: 97 mg/dL (ref 65–99)
Potassium: 5.2 mmol/L (ref 3.5–5.2)
SODIUM: 145 mmol/L — AB (ref 134–144)

## 2017-02-04 LAB — LIPID PANEL
Chol/HDL Ratio: 1.8 ratio (ref 0.0–4.4)
Cholesterol, Total: 134 mg/dL (ref 100–199)
HDL: 75 mg/dL
LDL Calculated: 48 mg/dL (ref 0–99)
Triglycerides: 53 mg/dL (ref 0–149)
VLDL Cholesterol Cal: 11 mg/dL (ref 5–40)

## 2017-02-05 ENCOUNTER — Other Ambulatory Visit: Payer: Self-pay | Admitting: *Deleted

## 2017-02-05 DIAGNOSIS — N183 Chronic kidney disease, stage 3 unspecified: Secondary | ICD-10-CM

## 2017-02-05 DIAGNOSIS — Z79899 Other long term (current) drug therapy: Secondary | ICD-10-CM

## 2017-02-09 ENCOUNTER — Ambulatory Visit (HOSPITAL_BASED_OUTPATIENT_CLINIC_OR_DEPARTMENT_OTHER): Payer: Medicare Other

## 2017-02-09 ENCOUNTER — Ambulatory Visit (HOSPITAL_BASED_OUTPATIENT_CLINIC_OR_DEPARTMENT_OTHER): Payer: Medicare Other | Admitting: Family

## 2017-02-09 ENCOUNTER — Other Ambulatory Visit (HOSPITAL_BASED_OUTPATIENT_CLINIC_OR_DEPARTMENT_OTHER): Payer: Medicare Other

## 2017-02-09 VITALS — BP 178/62 | HR 55 | Temp 98.2°F | Resp 16 | Wt 118.0 lb

## 2017-02-09 DIAGNOSIS — D631 Anemia in chronic kidney disease: Secondary | ICD-10-CM | POA: Diagnosis not present

## 2017-02-09 DIAGNOSIS — D510 Vitamin B12 deficiency anemia due to intrinsic factor deficiency: Secondary | ICD-10-CM | POA: Diagnosis not present

## 2017-02-09 DIAGNOSIS — N183 Chronic kidney disease, stage 3 unspecified: Secondary | ICD-10-CM

## 2017-02-09 DIAGNOSIS — Z79899 Other long term (current) drug therapy: Secondary | ICD-10-CM

## 2017-02-09 DIAGNOSIS — D500 Iron deficiency anemia secondary to blood loss (chronic): Secondary | ICD-10-CM

## 2017-02-09 DIAGNOSIS — J90 Pleural effusion, not elsewhere classified: Secondary | ICD-10-CM

## 2017-02-09 DIAGNOSIS — R0602 Shortness of breath: Secondary | ICD-10-CM

## 2017-02-09 LAB — CMP (CANCER CENTER ONLY)
ALBUMIN: 2.8 g/dL — AB (ref 3.3–5.5)
ALT(SGPT): 31 U/L (ref 10–47)
AST: 26 U/L (ref 11–38)
Alkaline Phosphatase: 69 U/L (ref 26–84)
BILIRUBIN TOTAL: 0.6 mg/dL (ref 0.20–1.60)
BUN, Bld: 42 mg/dL — ABNORMAL HIGH (ref 7–22)
CHLORIDE: 110 meq/L — AB (ref 98–108)
CO2: 26 meq/L (ref 18–33)
Calcium: 8.5 mg/dL (ref 8.0–10.3)
Creat: 2.2 mg/dl — ABNORMAL HIGH (ref 0.6–1.2)
Glucose, Bld: 100 mg/dL (ref 73–118)
Potassium: 5.5 mEq/L — ABNORMAL HIGH (ref 3.3–4.7)
Sodium: 144 mEq/L (ref 128–145)
Total Protein: 5.9 g/dL — ABNORMAL LOW (ref 6.4–8.1)

## 2017-02-09 LAB — CBC WITH DIFFERENTIAL (CANCER CENTER ONLY)
BASO#: 0.1 10*3/uL (ref 0.0–0.2)
BASO%: 1.2 % (ref 0.0–2.0)
EOS ABS: 0.2 10*3/uL (ref 0.0–0.5)
EOS%: 3.3 % (ref 0.0–7.0)
HCT: 32.1 % — ABNORMAL LOW (ref 34.8–46.6)
HEMOGLOBIN: 10.4 g/dL — AB (ref 11.6–15.9)
LYMPH#: 1 10*3/uL (ref 0.9–3.3)
LYMPH%: 13.7 % — ABNORMAL LOW (ref 14.0–48.0)
MCH: 29.6 pg (ref 26.0–34.0)
MCHC: 32.4 g/dL (ref 32.0–36.0)
MCV: 92 fL (ref 81–101)
MONO#: 0.6 10*3/uL (ref 0.1–0.9)
MONO%: 8.2 % (ref 0.0–13.0)
NEUT%: 73.6 % (ref 39.6–80.0)
NEUTROS ABS: 5.3 10*3/uL (ref 1.5–6.5)
Platelets: 184 10*3/uL (ref 145–400)
RBC: 3.51 10*6/uL — ABNORMAL LOW (ref 3.70–5.32)
RDW: 15.6 % (ref 11.1–15.7)
WBC: 7.2 10*3/uL (ref 3.9–10.0)

## 2017-02-09 LAB — IRON AND TIBC
%SAT: 41 % (ref 21–57)
Iron: 93 ug/dL (ref 41–142)
TIBC: 229 ug/dL — ABNORMAL LOW (ref 236–444)
UIBC: 136 ug/dL (ref 120–384)

## 2017-02-09 LAB — FERRITIN: Ferritin: 163 ng/ml (ref 9–269)

## 2017-02-09 MED ORDER — DARBEPOETIN ALFA 300 MCG/0.6ML IJ SOSY
PREFILLED_SYRINGE | INTRAMUSCULAR | Status: AC
  Filled 2017-02-09: qty 0.6

## 2017-02-09 MED ORDER — DARBEPOETIN ALFA 300 MCG/0.6ML IJ SOSY
300.0000 ug | PREFILLED_SYRINGE | Freq: Once | INTRAMUSCULAR | Status: AC
  Administered 2017-02-09: 300 ug via SUBCUTANEOUS

## 2017-02-09 NOTE — Progress Notes (Signed)
Hematology and Oncology Follow Up Visit  [name] [id number] Jul 09, 1931 83 y.o. 02/09/2017   Principle Diagnosis:  Anemia secondary to chronic renal insufficiency and erythropoietin deficiency  Iron def anemia Pernicious anemia   Current Therapy:   Aranesp 300 mcg SQ  q 3 weeks for Hgb < 11 Vit B 12 1000 mcg IM as indicated  IV iron as indicated - last received in March 2018   Interim History:  [name] is here today with her daughter for follow-up. She had a wonderful weekend with all 6 of her children as well as grandchildren and great grand children. They surprised her with a visit. She is feeling a little fatigued at times where she stayed up late all weekend enjoying her family.  Hgb is 10.4 so we will proceed with Aranesp injection today as planned.  She denies any episodes of bleeding or bruising. No lymphadenopathy found on exam.  No fever, chills, n/v, cough, rash, dizziness, SOB, chest pain, palpitations, abdominal pain or changes in bowel or bladder habits.  No swelling, tenderness, numbness or tingling in her extremities. She had her lasix adjusted and is feeling much better. Her weight is up 4 lbs since her last visit which she is quite happy about.  Her appetite comes and goes and she admits that she needs to drink more fluids.   ECOG Performance Status: 1 - Symptomatic but completely ambulatory  Medications:  Allergies as of 02/09/2017      Reactions   Penicillins Swelling, Rash   Has patient had a PCN reaction causing immediate rash, facial/tongue/throat swelling, SOB or lightheadedness with hypotension: Unknown Has patient had a PCN reaction causing severe rash involving mucus membranes or skin necrosis: No Has patient had a PCN reaction that required hospitalization No Has patient had a PCN reaction occurring within the last 10 years: No If all of the above answers are "NO", then may proceed with Cephalosporin use.   Clonidine Derivatives Other (See  Comments)   Fatigue Bradycardia   Maxzide [hydrochlorothiazide W-triamterene] Other (See Comments)   hyponatremia      Medication List       Accurate as of 02/09/17 11:56 AM. Always use your most recent med list.          amLODipine 5 MG tablet Commonly known as:  NORVASC TAKE ONE TABLET BY MOUTH TWICE DAILY   aspirin 81 MG tablet Take 81 mg by mouth daily.   atorvastatin 20 MG tablet Commonly known as:  LIPITOR TAKE 1 TABLET (20 MG TOTAL) BY MOUTH DAILY.   CALCIUM PO Take 1 tablet by mouth daily.   carvedilol 3.125 MG tablet Commonly known as:  COREG TAKE 1 TABLET (3.125 MG TOTAL) BY MOUTH DAILY.   cholecalciferol 1000 units tablet Commonly known as:  VITAMIN D Take 1,000 Units by mouth daily.   clopidogrel 75 MG tablet Commonly known as:  PLAVIX TAKE ONE TABLET BY MOUTH DAILY WITH BREAKFAST   fluticasone 50 MCG/ACT nasal spray Commonly known as:  FLONASE Place 1 spray into both nostrils daily.   furosemide 20 MG tablet Commonly known as:  LASIX Take 3 tablets (60 mg) every other day   hydrALAZINE 25 MG tablet Commonly known as:  APRESOLINE Take 2 tablets (50 mg total) by mouth 3 (three) times daily.   lisinopril 40 MG tablet Commonly known as:  PRINIVIL,ZESTRIL TAKE ONE TABLET BY MOUTH DAILY   potassium chloride 10 MEQ tablet Commonly known as:  K-DUR,KLOR-CON Take 1 tablet every other  day.  Take with furosemide.   PRESERVISION AREDS Caps Take 1 capsule by mouth daily.       Allergies:  Allergies  Allergen Reactions  . Penicillins Swelling and Rash    Has patient had a PCN reaction causing immediate rash, facial/tongue/throat swelling, SOB or lightheadedness with hypotension: Unknown Has patient had a PCN reaction causing severe rash involving mucus membranes or skin necrosis: No Has patient had a PCN reaction that required hospitalization No Has patient had a PCN reaction occurring within the last 10 years: No If all of the above answers  are "NO", then may proceed with Cephalosporin use.   . Clonidine Derivatives Other (See Comments)    Fatigue Bradycardia   . Maxzide [Hydrochlorothiazide W-Triamterene] Other (See Comments)    hyponatremia    Past Medical History, Surgical history, Social history, and Family History were reviewed and updated.  Review of Systems: All other 10 point review of systems is negative.   Physical Exam:  weight is 118 lb (53.5 kg). Her oral temperature is 98.2 F (36.8 C). Her blood pressure is 178/62 (abnormal) and her pulse is 55 (abnormal). Her respiration is 16 and oxygen saturation is 97%.   Wt Readings from Last 3 Encounters:  02/09/17 118 lb (53.5 kg)  02/01/17 118 lb (53.5 kg)  01/12/17 114 lb 12.8 oz (52.1 kg)    Ocular: Sclerae unicteric, pupils equal, round and reactive to light Ear-nose-throat: Oropharynx clear, dentition fair Lymphatic: No cervical, supraclavicular or axillary adenopathy Lungs no rales or rhonchi, good excursion bilaterally Heart regular rate and rhythm, no murmur appreciated Abd soft, nontender, positive bowel sounds, no liver or spleen tip palpated on exam, no fluid wave  MSK no focal spinal tenderness, no joint edema Neuro: non-focal, well-oriented, appropriate affect Breasts: Deferred   Lab Results  Component Value Date   WBC 7.2 02/09/2017   HGB 10.4 (L) 02/09/2017   HCT 32.1 (L) 02/09/2017   MCV 92 02/09/2017   PLT 184 02/09/2017   Lab Results  Component Value Date   FERRITIN 90 01/12/2017   IRON 61 01/12/2017   TIBC 229 (L) 01/12/2017   UIBC 168 01/12/2017   IRONPCTSAT 26 01/12/2017   Lab Results  Component Value Date   RETICCTPCT 1.3 05/11/2014   RBC 3.51 (L) 02/09/2017   RETICCTABS 44.6 05/11/2014   No results found for: KPAFRELGTCHN, LAMBDASER, KAPLAMBRATIO No results found for: Adalberto Ill Lab Results  Component Value Date   [facility] Not Observed 06/01/2016     Chemistry      Component Value Date/Time   NA 144  02/09/2017 1104   NA 142 12/22/2016 1106   K 5.5 no hemolysis noted (H) 02/09/2017 1104   K 4.8 12/22/2016 1106   CL 110 (H) 02/09/2017 1104   CO2 26 02/09/2017 1104   CO2 22 12/22/2016 1106   BUN 42 (H) 02/09/2017 1104   BUN 26.1 (H) 12/22/2016 1106   CREATININE 2.2 (H) 02/09/2017 1104   CREATININE 1.5 (H) 12/22/2016 1106      Component Value Date/Time   CALCIUM 8.5 02/09/2017 1104   CALCIUM 8.4 12/22/2016 1106   ALKPHOS 69 02/09/2017 1104   ALKPHOS 75 12/22/2016 1106   AST 26 02/09/2017 1104   AST 19 12/22/2016 1106   ALT 31 02/09/2017 1104   ALT 13 12/22/2016 1106   BILITOT 0.60 02/09/2017 1104   BILITOT 0.38 12/22/2016 1106      Impression and Plan: [name] is a very pleasant 83 yo  caucasian female with multifactorial anemia (iron deficiency, pernicious and chronic renal insufficieny anemias). She is a little fatigued after a busy weekend but otherwise has no complaints.  Hgb is 10.4 with an MCV of 92. She will get Aranesp while she is here today.  We will see what her iron studies and B 12 level show and bring her back in next week for infusion and/or injection if needed.  We will plan to see her back in 1 months for repeat lab work and follow-up.  Both she and her sweet family know to contact our office with any questions or concerns. We can certainly see her sooner if need be.   Verdie Mosher, NP 6/19/201811:56 AM

## 2017-02-09 NOTE — Patient Instructions (Signed)

## 2017-02-10 ENCOUNTER — Telehealth: Payer: Self-pay | Admitting: *Deleted

## 2017-02-10 LAB — BRAIN NATRIURETIC PEPTIDE: BNP: 1007.8 pg/mL — ABNORMAL HIGH (ref 0.0–100.0)

## 2017-02-10 LAB — RETICULOCYTES: Reticulocyte Count: 0.4 % — ABNORMAL LOW (ref 0.6–2.6)

## 2017-02-10 NOTE — Telephone Encounter (Addendum)
Patient is aware of results and to stop oral potassium  ----- Message from Verdie MosherSarah M [place], NP sent at 02/10/2017  8:53 AM EDT ----- Regarding: Iron and potassium Iron studies look great! Lay off the potassium! K is 5.5. Thank you!  Sarah  ----- Message ----- From: Josph MachoEnnever, Peter R, MD Sent: 02/10/2017   7:12 AM To: Verdie MosherSarah M [place], NP    ----- Message ----- From: Interface, Lab In Three Zero One Sent: 02/09/2017  11:16 AM To: Josph MachoPeter R Ennever, MD

## 2017-02-18 ENCOUNTER — Telehealth: Payer: Self-pay | Admitting: *Deleted

## 2017-02-18 DIAGNOSIS — E875 Hyperkalemia: Secondary | ICD-10-CM

## 2017-02-18 MED ORDER — FUROSEMIDE 20 MG PO TABS: 40.0000 mg | ORAL_TABLET | Freq: Every day | ORAL | 6 refills | Status: DC

## 2017-02-18 MED ORDER — LISINOPRIL 20 MG PO TABS: 20.0000 mg | ORAL_TABLET | Freq: Every day | ORAL | 3 refills | Status: DC

## 2017-02-18 NOTE — Telephone Encounter (Signed)
Spoke with patient's daughter.  Confirmed patient has been off potassium and will have rechecked on 02/25/17.  Also updated her medication to reflect other recent changes.  She currently takes lasix 40 mg daily and lisinopril 20 mg daily.  Potassium removed from her list.

## 2017-02-18 NOTE — Telephone Encounter (Signed)
-----   Message from Pricilla RifflePaula Ross V, MD sent at 02/17/2017  4:41 AM EDT ----- Please confirm pt is off of potassium  Get BMET in 7 days

## 2017-02-23 ENCOUNTER — Other Ambulatory Visit: Payer: Self-pay | Admitting: Internal Medicine

## 2017-02-25 ENCOUNTER — Other Ambulatory Visit: Payer: Medicare Other

## 2017-02-25 DIAGNOSIS — E875 Hyperkalemia: Secondary | ICD-10-CM

## 2017-02-25 LAB — BASIC METABOLIC PANEL
BUN / CREAT RATIO: 17 (ref 12–28)
BUN: 46 mg/dL — AB (ref 8–27)
CO2: 18 mmol/L — ABNORMAL LOW (ref 20–29)
CREATININE: 2.78 mg/dL — AB (ref 0.57–1.00)
Calcium: 8.5 mg/dL — ABNORMAL LOW (ref 8.7–10.3)
Chloride: 103 mmol/L (ref 96–106)
GFR calc Af Amer: 17 mL/min/{1.73_m2} — ABNORMAL LOW (ref >59)
GFR, EST NON AFRICAN AMERICAN: 15 mL/min/{1.73_m2} — AB (ref >59)
Glucose: 130 mg/dL — ABNORMAL HIGH (ref 65–99)
Potassium: 5.2 mmol/L (ref 3.5–5.2)
SODIUM: 138 mmol/L (ref 134–144)

## 2017-02-26 ENCOUNTER — Telehealth: Payer: Self-pay | Admitting: *Deleted

## 2017-02-26 DIAGNOSIS — R799 Abnormal finding of blood chemistry, unspecified: Secondary | ICD-10-CM | POA: Insufficient documentation

## 2017-02-26 DIAGNOSIS — R7989 Other specified abnormal findings of blood chemistry: Secondary | ICD-10-CM | POA: Insufficient documentation

## 2017-02-26 NOTE — Telephone Encounter (Signed)
Spoke with the pts Daughter Trudie BucklerHeidi, and informed her of Dr Charlott Rakesoss's recommendations for the pt, based on her recent abnormal lab results.  Asked the pts Daughter if the pt is taking K or not?  Per the pts Daughter, the pt is currently not taking any potassium.  Per Trudie BucklerHeidi, she received a very detailed message from Dr Tenny Crawoss, on her VM last night, explaining med instructions for the pt.  Daughter is aware to have her hold her lasix and lisinopril, until repeat BMET on 03/02/17.  Daughter agreed to bring the pt in for a lab appt on 03/02/17, to recheck a BMET.  Daughter verbalized understanding and agrees with this plan.

## 2017-02-26 NOTE — Telephone Encounter (Signed)
-----   Message from Dietrich PatesPaula Ross V, MD sent at 02/25/2017  8:45 PM EDT ----- Left message on Daughter's voice mail   Call again in Am Kidney function is down from last check  May be a little too dry Hold Lasix Make sure she is not taking K   Make sure not taking lisinopril Check BMET on Tuesday

## 2017-03-02 ENCOUNTER — Other Ambulatory Visit: Payer: Medicare Other

## 2017-03-02 DIAGNOSIS — R7989 Other specified abnormal findings of blood chemistry: Secondary | ICD-10-CM

## 2017-03-02 DIAGNOSIS — R799 Abnormal finding of blood chemistry, unspecified: Secondary | ICD-10-CM

## 2017-03-02 LAB — BASIC METABOLIC PANEL
BUN/Creatinine Ratio: 19 (ref 12–28)
BUN: 42 mg/dL — ABNORMAL HIGH (ref 8–27)
CALCIUM: 8.1 mg/dL — AB (ref 8.7–10.3)
CHLORIDE: 109 mmol/L — AB (ref 96–106)
CO2: 18 mmol/L — AB (ref 20–29)
Creatinine, Ser: 2.25 mg/dL — ABNORMAL HIGH (ref 0.57–1.00)
GFR calc Af Amer: 23 mL/min/{1.73_m2} — ABNORMAL LOW (ref >59)
GFR, EST NON AFRICAN AMERICAN: 20 mL/min/{1.73_m2} — AB (ref >59)
Glucose: 137 mg/dL — ABNORMAL HIGH (ref 65–99)
POTASSIUM: 5.4 mmol/L — AB (ref 3.5–5.2)
Sodium: 141 mmol/L (ref 134–144)

## 2017-03-24 ENCOUNTER — Telehealth: Payer: Self-pay | Admitting: Internal Medicine

## 2017-03-24 NOTE — Telephone Encounter (Signed)
I spoke to patient's daughter.  Patient has been doing pretty good the last month or so.  No new or worsening SOB or swelling.   She is seeing Dr. Myna HidalgoEnnever tomorrow and will have labs drawn.  I spoke to that office to confirm there will be CMP drawn.  Will watch for this to result and review with Dr. Tenny Crawoss.  Pt's daughter verbalizes understanding and appreciation for information provided.

## 2017-03-24 NOTE — Telephone Encounter (Signed)
New message      Calling to see when pt is due to have more labs drawn.  Please call and let daughter know.

## 2017-03-25 ENCOUNTER — Ambulatory Visit (HOSPITAL_BASED_OUTPATIENT_CLINIC_OR_DEPARTMENT_OTHER): Payer: Medicare Other | Admitting: Family

## 2017-03-25 ENCOUNTER — Ambulatory Visit (HOSPITAL_BASED_OUTPATIENT_CLINIC_OR_DEPARTMENT_OTHER): Payer: Medicare Other

## 2017-03-25 ENCOUNTER — Other Ambulatory Visit (HOSPITAL_BASED_OUTPATIENT_CLINIC_OR_DEPARTMENT_OTHER): Payer: Medicare Other

## 2017-03-25 VITALS — BP 158/70 | HR 66 | Temp 98.3°F | Resp 16 | Wt 123.0 lb

## 2017-03-25 DIAGNOSIS — D631 Anemia in chronic kidney disease: Secondary | ICD-10-CM

## 2017-03-25 DIAGNOSIS — D510 Vitamin B12 deficiency anemia due to intrinsic factor deficiency: Secondary | ICD-10-CM

## 2017-03-25 DIAGNOSIS — N183 Chronic kidney disease, stage 3 unspecified: Secondary | ICD-10-CM

## 2017-03-25 DIAGNOSIS — D500 Iron deficiency anemia secondary to blood loss (chronic): Secondary | ICD-10-CM

## 2017-03-25 DIAGNOSIS — K909 Intestinal malabsorption, unspecified: Secondary | ICD-10-CM

## 2017-03-25 LAB — COMPREHENSIVE METABOLIC PANEL
ALT: 11 U/L (ref 0–55)
ANION GAP: 7 meq/L (ref 3–11)
AST: 20 U/L (ref 5–34)
Albumin: 3.2 g/dL — ABNORMAL LOW (ref 3.5–5.0)
Alkaline Phosphatase: 83 U/L (ref 40–150)
BILIRUBIN TOTAL: 0.44 mg/dL (ref 0.20–1.20)
BUN: 26.7 mg/dL — ABNORMAL HIGH (ref 7.0–26.0)
CO2: 22 mEq/L (ref 22–29)
Calcium: 8.8 mg/dL (ref 8.4–10.4)
Chloride: 112 mEq/L — ABNORMAL HIGH (ref 98–109)
Creatinine: 1.9 mg/dL — ABNORMAL HIGH (ref 0.6–1.1)
EGFR: 24 mL/min/{1.73_m2} — AB (ref >90)
Glucose: 93 mg/dl (ref 70–140)
POTASSIUM: 5.1 meq/L (ref 3.5–5.1)
SODIUM: 142 meq/L (ref 136–145)
TOTAL PROTEIN: 6.1 g/dL — AB (ref 6.4–8.3)

## 2017-03-25 LAB — CBC WITH DIFFERENTIAL (CANCER CENTER ONLY)
BASO#: 0.1 10*3/uL (ref 0.0–0.2)
BASO%: 1.4 % (ref 0.0–2.0)
EOS%: 2.2 % (ref 0.0–7.0)
Eosinophils Absolute: 0.2 10*3/uL (ref 0.0–0.5)
HEMATOCRIT: 33.4 % — AB (ref 34.8–46.6)
HEMOGLOBIN: 10.9 g/dL — AB (ref 11.6–15.9)
LYMPH#: 0.9 10*3/uL (ref 0.9–3.3)
LYMPH%: 12 % — ABNORMAL LOW (ref 14.0–48.0)
MCH: 29.7 pg (ref 26.0–34.0)
MCHC: 32.6 g/dL (ref 32.0–36.0)
MCV: 91 fL (ref 81–101)
MONO#: 0.6 10*3/uL (ref 0.1–0.9)
MONO%: 7.9 % (ref 0.0–13.0)
NEUT%: 76.5 % (ref 39.6–80.0)
NEUTROS ABS: 5.6 10*3/uL (ref 1.5–6.5)
Platelets: 209 10*3/uL (ref 145–400)
RBC: 3.67 10*6/uL — AB (ref 3.70–5.32)
RDW: 15.4 % (ref 11.1–15.7)
WBC: 7.3 10*3/uL (ref 3.9–10.0)

## 2017-03-25 LAB — IRON AND TIBC
%SAT: 41 % (ref 21–57)
IRON: 94 ug/dL (ref 41–142)
TIBC: 228 ug/dL — AB (ref 236–444)
UIBC: 134 ug/dL (ref 120–384)

## 2017-03-25 LAB — FERRITIN: Ferritin: 180 ng/ml (ref 9–269)

## 2017-03-25 MED ORDER — DARBEPOETIN ALFA 300 MCG/0.6ML IJ SOSY
PREFILLED_SYRINGE | INTRAMUSCULAR | Status: AC
  Filled 2017-03-25: qty 0.6

## 2017-03-25 MED ORDER — DARBEPOETIN ALFA 300 MCG/0.6ML IJ SOSY
300.0000 ug | PREFILLED_SYRINGE | Freq: Once | INTRAMUSCULAR | Status: AC
  Administered 2017-03-25: 300 ug via SUBCUTANEOUS

## 2017-03-25 NOTE — Progress Notes (Signed)
Hematology and Oncology Follow Up Visit  [name] [id number] [birth year] 83 y.o. 03/25/2017   Principle Diagnosis:  Anemia secondary to chronic renal insufficiency and erythropoietin deficiency  Iron def anemia Pernicious anemia   Current Therapy:   Aranesp 300 mcg SQ q 3 weeks for Hgb <11 Vit B 12 1000 mcg IM as indicated  IV iron as indicated - last received in March 2018   Interim History:  Ms. [name] is here today with her daughter for follow-up. She is doing well and has no new complaints at this time. She has occasional fatigue but this is tolerable and she has still been able to get out and enjoy herself. She and her older daughter getting ready to move into a new home later this month.  Hgb is 10.9 today so we will proceed with Aranesp injection.  No fever, chills, n/v, cough, rash, dizziness, SOB, chest pain, palpitations, abdominal pain or changes in bowel or bladder habits.  No swelling, tenderness, numbness or tingling in her extremities at this time. No c/o pain.  She has a much better appetite and her weight is up another 5 lbs. She is staying well hydrated.   ECOG Performance Status: 1 - Symptomatic but completely ambulatory  Medications:  Allergies as of 03/25/2017      Reactions   Penicillins Swelling, Rash   Has patient had a PCN reaction causing immediate rash, facial/tongue/throat swelling, SOB or lightheadedness with hypotension: Unknown Has patient had a PCN reaction causing severe rash involving mucus membranes or skin necrosis: No Has patient had a PCN reaction that required hospitalization No Has patient had a PCN reaction occurring within the last 10 years: No If all of the above answers are "NO", then may proceed with Cephalosporin use.   Clonidine Derivatives Other (See Comments)   Fatigue Bradycardia   Maxzide [hydrochlorothiazide W-triamterene] Other (See Comments)   hyponatremia      Medication List       Accurate as of 03/25/17 11:55 AM.  Always use your most recent med list.          amLODipine 5 MG tablet Commonly known as:  NORVASC TAKE ONE TABLET BY MOUTH TWICE DAILY   aspirin 81 MG tablet Take 81 mg by mouth daily.   atorvastatin 20 MG tablet Commonly known as:  LIPITOR TAKE 1 TABLET (20 MG TOTAL) BY MOUTH DAILY.   CALCIUM PO Take 1 tablet by mouth daily.   carvedilol 3.125 MG tablet Commonly known as:  COREG TAKE 1 TABLET (3.125 MG TOTAL) BY MOUTH DAILY.   cholecalciferol 1000 units tablet Commonly known as:  VITAMIN D Take 1,000 Units by mouth daily.   clopidogrel 75 MG tablet Commonly known as:  PLAVIX TAKE ONE TABLET BY MOUTH DAILY WITH BREAKFAST   fluticasone 50 MCG/ACT nasal spray Commonly known as:  FLONASE Place 1 spray into both nostrils daily.   furosemide 20 MG tablet Commonly known as:  LASIX Take 2 tablets (40 mg total) by mouth daily. Take 2 tablets (40 mg) every other day   hydrALAZINE 25 MG tablet Commonly known as:  APRESOLINE Take 2 tablets (50 mg total) by mouth 3 (three) times daily.   lisinopril 20 MG tablet Commonly known as:  PRINIVIL,ZESTRIL Take 1 tablet (20 mg total) by mouth daily.   PRESERVISION AREDS Caps Take 1 capsule by mouth daily.       Allergies:  Allergies  Allergen Reactions  . Penicillins Swelling and Rash    Has patient  had a PCN reaction causing immediate rash, facial/tongue/throat swelling, SOB or lightheadedness with hypotension: Unknown Has patient had a PCN reaction causing severe rash involving mucus membranes or skin necrosis: No Has patient had a PCN reaction that required hospitalization No Has patient had a PCN reaction occurring within the last 10 years: No If all of the above answers are "NO", then may proceed with Cephalosporin use.   . Clonidine Derivatives Other (See Comments)    Fatigue Bradycardia   . Maxzide [Hydrochlorothiazide W-Triamterene] Other (See Comments)    hyponatremia    Past Medical History, Surgical  history, Social history, and Family History were reviewed and updated.  Review of Systems: All other 10 point review of systems is negative.   Physical Exam:  vitals were not taken for this visit.  Wt Readings from Last 3 Encounters:  02/09/17 118 lb (53.5 kg)  02/01/17 118 lb (53.5 kg)  01/12/17 114 lb 12.8 oz (52.1 kg)    Ocular: Sclerae unicteric, pupils equal, round and reactive to light Ear-nose-throat: Oropharynx clear, dentition fair Lymphatic: No cervical, supraclavicular or axillary adenopathy Lungs no rales or rhonchi, good excursion bilaterally Heart regular rate and rhythm, no murmur appreciated Abd soft, nontender, positive bowel sounds, no liver or spleen tip palpated on exam, no fluid wave MSK no focal spinal tenderness, no joint edema Neuro: non-focal, well-oriented, appropriate affect Breasts: Deferred   Lab Results  Component Value Date   WBC 7.3 03/25/2017   HGB 10.9 (L) 03/25/2017   HCT 33.4 (L) 03/25/2017   MCV 91 03/25/2017   PLT 209 03/25/2017   Lab Results  Component Value Date   FERRITIN 163 02/09/2017   IRON 93 02/09/2017   TIBC 229 (L) 02/09/2017   UIBC 136 02/09/2017   IRONPCTSAT 41 02/09/2017   Lab Results  Component Value Date   RETICCTPCT 1.3 05/11/2014   RBC 3.67 (L) 03/25/2017   RETICCTABS 44.6 05/11/2014   No results found for: KPAFRELGTCHN, LAMBDASER, KAPLAMBRATIO No results found for: Loel LoftyGGSERUM, IGA, [facility] Lab Results  Component Value Date   [facility] Not [facility] 06/01/2016     Chemistry      Component Value Date/Time   NA 141 03/02/2017 0921   NA 144 02/09/2017 1104   NA 142 12/22/2016 1106   K 5.4 (H) 03/02/2017 0921   K 5.5 no hemolysis noted (H) 02/09/2017 1104   K 4.8 12/22/2016 1106   CL 109 (H) 03/02/2017 0921   CL 110 (H) 02/09/2017 1104   CO2 18 (L) 03/02/2017 0921   CO2 26 02/09/2017 1104   CO2 22 12/22/2016 1106   BUN 42 (H) 03/02/2017 0921   BUN 42 (H) 02/09/2017 1104   BUN 26.1 (H) 12/22/2016 1106    CREATININE 2.25 (H) 03/02/2017 0921   CREATININE 2.2 (H) 02/09/2017 1104   CREATININE 1.5 (H) 12/22/2016 1106      Component Value Date/Time   CALCIUM 8.1 (L) 03/02/2017 0921   CALCIUM 8.5 02/09/2017 1104   CALCIUM 8.4 12/22/2016 1106   ALKPHOS 69 02/09/2017 1104   ALKPHOS 75 12/22/2016 1106   AST 26 02/09/2017 1104   AST 19 12/22/2016 1106   ALT 31 02/09/2017 1104   ALT 13 12/22/2016 1106   BILITOT 0.60 02/09/2017 1104   BILITOT 0.38 12/22/2016 1106      Impression and Plan: Ms. [name] is a very pleasant 83 yo caucasian female with multifactorial anemia (iron, pernicious and renal insufficiency). She has occasional fatigue but no other complaints at that time.  Hgb is 10.9 so she will get Aranesp today.  We will plan to see her back again in 1 month for repeat lab work and follow-up.  Both she hand her sweet daughters know to contact our office with any questions or concerns. We can certainly see her sooner if need be.   Verdie Mosher, NP 8/2/201811:55 AM

## 2017-03-25 NOTE — Patient Instructions (Signed)

## 2017-03-26 LAB — RETICULOCYTES: Reticulocyte Count: 0.5 % — ABNORMAL LOW (ref 0.6–2.6)

## 2017-03-26 LAB — VITAMIN B12: Vitamin B12: 323 pg/mL (ref 232–1245)

## 2017-04-19 ENCOUNTER — Emergency Department (HOSPITAL_BASED_OUTPATIENT_CLINIC_OR_DEPARTMENT_OTHER)
Admission: EM | Admit: 2017-04-19 | Discharge: 2017-04-19 | Disposition: A | Discharge status: Home / Self Care | Payer: Medicare Other | Attending: Emergency Medicine | Admitting: Emergency Medicine

## 2017-04-19 ENCOUNTER — Encounter (HOSPITAL_BASED_OUTPATIENT_CLINIC_OR_DEPARTMENT_OTHER): Payer: Self-pay | Admitting: *Deleted

## 2017-04-19 ENCOUNTER — Telehealth: Payer: Self-pay | Admitting: Internal Medicine

## 2017-04-19 ENCOUNTER — Emergency Department (HOSPITAL_BASED_OUTPATIENT_CLINIC_OR_DEPARTMENT_OTHER): Payer: Medicare Other

## 2017-04-19 DIAGNOSIS — N183 Chronic kidney disease, stage 3 (moderate): Secondary | ICD-10-CM | POA: Insufficient documentation

## 2017-04-19 DIAGNOSIS — Z7982 Long term (current) use of aspirin: Secondary | ICD-10-CM | POA: Diagnosis not present

## 2017-04-19 DIAGNOSIS — Z79899 Other long term (current) drug therapy: Secondary | ICD-10-CM | POA: Insufficient documentation

## 2017-04-19 DIAGNOSIS — R0602 Shortness of breath: Secondary | ICD-10-CM | POA: Diagnosis present

## 2017-04-19 DIAGNOSIS — I129 Hypertensive chronic kidney disease with stage 1 through stage 4 chronic kidney disease, or unspecified chronic kidney disease: Secondary | ICD-10-CM | POA: Insufficient documentation

## 2017-04-19 DIAGNOSIS — I2510 Atherosclerotic heart disease of native coronary artery without angina pectoris: Secondary | ICD-10-CM | POA: Insufficient documentation

## 2017-04-19 DIAGNOSIS — Z87891 Personal history of nicotine dependence: Secondary | ICD-10-CM | POA: Diagnosis not present

## 2017-04-19 DIAGNOSIS — Z7902 Long term (current) use of antithrombotics/antiplatelets: Secondary | ICD-10-CM | POA: Insufficient documentation

## 2017-04-19 LAB — COMPREHENSIVE METABOLIC PANEL
ALBUMIN: 3.5 g/dL (ref 3.5–5.0)
ALT: 12 U/L — ABNORMAL LOW (ref 14–54)
ANION GAP: 10 (ref 5–15)
AST: 20 U/L (ref 15–41)
Alkaline Phosphatase: 77 U/L (ref 38–126)
BUN: 37 mg/dL — ABNORMAL HIGH (ref 6–20)
CHLORIDE: 110 mmol/L (ref 101–111)
CO2: 21 mmol/L — AB (ref 22–32)
Calcium: 8.3 mg/dL — ABNORMAL LOW (ref 8.9–10.3)
Creatinine, Ser: 2.09 mg/dL — ABNORMAL HIGH (ref 0.44–1.00)
GFR calc Af Amer: 24 mL/min — ABNORMAL LOW (ref >60)
GFR calc non Af Amer: 21 mL/min — ABNORMAL LOW (ref >60)
GLUCOSE: 100 mg/dL — AB (ref 65–99)
POTASSIUM: 4.8 mmol/L (ref 3.5–5.1)
SODIUM: 141 mmol/L (ref 135–145)
TOTAL PROTEIN: 6.6 g/dL (ref 6.5–8.1)
Total Bilirubin: 0.4 mg/dL (ref 0.3–1.2)

## 2017-04-19 LAB — CBC WITH DIFFERENTIAL/PLATELET
BASOS ABS: 0.1 10*3/uL (ref 0.0–0.1)
Basophils Relative: 2 %
Eosinophils Absolute: 0.2 10*3/uL (ref 0.0–0.7)
Eosinophils Relative: 2 %
HEMATOCRIT: 35.3 % — AB (ref 36.0–46.0)
HEMOGLOBIN: 11.2 g/dL — AB (ref 12.0–15.0)
LYMPHS ABS: 0.9 10*3/uL (ref 0.7–4.0)
LYMPHS PCT: 12 %
MCH: 29 pg (ref 26.0–34.0)
MCHC: 31.7 g/dL (ref 30.0–36.0)
MCV: 91.5 fL (ref 78.0–100.0)
Monocytes Absolute: 0.5 10*3/uL (ref 0.1–1.0)
Monocytes Relative: 7 %
NEUTROS ABS: 5.7 10*3/uL (ref 1.7–7.7)
NEUTROS PCT: 77 %
PLATELETS: 211 10*3/uL (ref 150–400)
RBC: 3.86 MIL/uL — AB (ref 3.87–5.11)
RDW: 15.8 % — ABNORMAL HIGH (ref 11.5–15.5)
WBC: 7.4 10*3/uL (ref 4.0–10.5)

## 2017-04-19 LAB — URINALYSIS, ROUTINE W REFLEX MICROSCOPIC
Bilirubin Urine: NEGATIVE
GLUCOSE, UA: NEGATIVE mg/dL
Hgb urine dipstick: NEGATIVE
KETONES UR: NEGATIVE mg/dL
LEUKOCYTES UA: NEGATIVE
Nitrite: NEGATIVE
Specific Gravity, Urine: 1.017 (ref 1.005–1.030)
pH: 6 (ref 5.0–8.0)

## 2017-04-19 LAB — URINALYSIS, MICROSCOPIC (REFLEX)

## 2017-04-19 LAB — TROPONIN I
TROPONIN I: 0.03 ng/mL — AB (ref <0.03)
Troponin I: 0.03 ng/mL (ref <0.03)

## 2017-04-19 LAB — BRAIN NATRIURETIC PEPTIDE: B NATRIURETIC PEPTIDE 5: 1412.9 pg/mL — AB (ref 0.0–100.0)

## 2017-04-19 MED ORDER — HYDRALAZINE HCL 25 MG PO TABS
50.0000 mg | ORAL_TABLET | Freq: Once | ORAL | Status: AC
  Administered 2017-04-19: 50 mg via ORAL
  Filled 2017-04-19: qty 2

## 2017-04-19 NOTE — ED Triage Notes (Signed)
Pt and family reports pt with increased SOB over the last few days. Pt takes lasix every 5 days and had a dose yesterday. Pt reports she feels SOB while sitting on stretcher. RT at bedside. Family reports pt had fluid taken off left lung earlier this summer. Pt c/o of flank pain

## 2017-04-19 NOTE — Telephone Encounter (Signed)
New message   Pt daughter is calling about pt fluid. She is concerned that pt may need to be seen or have changes made to her medication. Please call.   Pt c/o swelling: STAT is pt has developed SOB within 24 hours  1. How long have you been experiencing swelling? Over the weekend.  2. Where is the swelling located? On left side. Per daughter, pt feels it in her abdomen.  3.  Are you currently taking a "fluid pill"? Yes.  4.  Are you currently SOB? Per pt daughter, yes.   5.  Have you traveled recently? No.

## 2017-04-19 NOTE — ED Notes (Signed)
Troponin 0.03, results given to Dr Madilyn Hook

## 2017-04-19 NOTE — Telephone Encounter (Signed)
Received message from operator that patient's daughter is taking her to [facility] to be evaluated, but would still like for me to call her back.  Advised to go ahead to med center and I will contact them later today.

## 2017-04-19 NOTE — Telephone Encounter (Signed)
Daughter reports patient was very uncomfortable and she didn't want her to be scared and uncomfortable all day long.  She c/o uncomfortableness in her abdomen and at times going through to her back throughout the day today.   She had urine collected today.    Informed that Dr. Tenny Craw is aware that she was seen today and will review her chart.  If there are any new orders we will be contacting her.

## 2017-04-19 NOTE — ED Provider Notes (Signed)
[facility]   CSN: [id number] Arrival date & time: 04/19/17  1208     History   Chief Complaint Chief Complaint  Patient presents with  . Shortness of Breath    HPI [name] is a 83 y.o. female.  The history is provided by the patient. No language interpreter was used.  Shortness of Breath    [name] is a 83 y.o. female who presents to the Emergency Department complaining of sob, side pain.  History is provided by the patient and her daughter. Over the last week the family has been moving and [name] has been eating less. She's been more short of breath with dyspnea on exertion and increased work of breathing. She also has right flank pain and swelling for the last few days. No fevers, vomiting, cough, chest pain, vomiting, diarrhea. Past Medical History:  Diagnosis Date  . Anemia    a. "On/off my whole life" - etiology unclear. Has never required blood transfusion.  . Anemia associated with stage 3 chronic renal failure 06/02/2016  . Arthritis   . CAD (coronary artery disease)   . Carotid artery occlusion    RICA 80-99%, LICA 40-59% by dopplers 06/2013. Also with cerebrovascular disease otherwise on MRA 06/2013.  . Carotid artery stenosis   . Chronic kidney disease   . Chronic renal insufficiency, stage 3 (moderate) 06/02/2016  . Depression   . Dizziness    a. Adm 06/2013: felt due to posterior circulation deficits from vertebrobasilar insufficiency and possibly vertigo.  Marland Kitchen DVT (deep venous thrombosis) (HCC)    a. Around age 31, details unclear (SVT vs DVT?), s/p vein stripping without recurrence.  . Erythropoietin deficiency anemia 06/02/2016  . Heart murmur    a. Longstanding. mild MR on prior ECG, also may be in setting of known vasc dz.  . Hiatal hernia   . Hyperlipidemia   . Hypertension    a. Intolerant to Maxzide due to hyponatremia. b. intolerant to Clonidine due to slow HR/fatigue. c. Discrepancy in BP felt due to  innominant stenosis.  . Iron deficiency anemia due to chronic blood loss 09/08/2016  . Iron malabsorption 09/08/2016  . Macular degeneration    In both eyes - no longer drives.  . Macular degeneration    L- macular degeneration, R eye impaired as well  . Multiple thyroid nodules    a. By [place] 06/2013. Bx recommended.  . Pernicious anemia 06/02/2016  . PFO (patent foramen ovale)    a. Per echo in 12/2010.  Marland Kitchen PVD (peripheral vascular disease) (HCC)    a. R innominant stenosis. b. 06/2013 ABI: 0.80 R, 0.50 L., DVT- 1970's  . Varicose veins     Patient Active Problem List   Diagnosis Date Noted  . Elevated serum creatinine 02/26/2017  . Elevated BUN 02/26/2017  . Iron deficiency anemia due to chronic blood loss 09/08/2016  . Iron malabsorption 09/08/2016  . Anemia in chronic kidney disease 08/18/2016  . Erythropoietin deficiency anemia 06/02/2016  . Pernicious anemia 06/02/2016  . Chronic renal insufficiency, stage 3 (moderate) 06/02/2016  . Carotid artery stenosis 10/31/2015  . Carotid stenosis 06/11/2014  . PVD (peripheral vascular disease) with claudication (HCC) 06/11/2014  . Varicose veins of lower extremities with other complications 02/16/2014  . Neck pain-Posterior 11/30/2013  . Osteopenia, senile 11/22/2013  . Heart palpitations 07/14/2013  . Burning sensation of left arm 07/14/2013  . Arm pain 07/14/2013  . Dizziness 09/12/2012  . Murmur 12/16/2010  .  Hyperlipidemia LDL goal <100 07/17/2009  . Essential hypertension 07/17/2009  . CAROTID STENOSIS 06/21/2009    Past Surgical History:  Procedure Laterality Date  . ABDOMINAL HYSTERECTOMY  1971  . APPENDECTOMY    . BLADDER SURGERY  2004   baldder sling  . CATARACT EXTRACTION    . CESAREAN SECTION     X1  . ENDARTERECTOMY Right 10/31/2015   Procedure: RIGHT CAROTID ENDARTERECTOMY;  Surgeon: Nada Libman, MD;  Location: [facility] OR;  Service: Vascular;  Laterality: Right;  . EYE SURGERY     cataracts removed, /w IOL  .  INCONTINENCE SURGERY    . IR THORACENTESIS ASP PLEURAL SPACE W/IMG GUIDE  11/23/2016  . LUMBAR DISC SURGERY    . Neck Muscle Surgery    . SPINE SURGERY    . TOE SURGERY Right    great toe with pins  . VARICOSE VEIN SURGERY     left leg    OB History    No data available       Home Medications    Prior to Admission medications   Medication Sig Start Date End Date Taking? Authorizing Provider  amLODipine (NORVASC) 5 MG tablet TAKE ONE TABLET BY MOUTH TWICE DAILY 01/25/17   Pricilla Riffle, MD  aspirin 81 MG tablet Take 81 mg by mouth daily.      [provider]  atorvastatin (LIPITOR) 20 MG tablet TAKE 1 TABLET (20 MG TOTAL) BY MOUTH DAILY. 01/25/17   Pricilla Riffle, MD  CALCIUM PO Take 1 tablet by mouth daily.    [provider]  carvedilol (COREG) 3.125 MG tablet TAKE 1 TABLET (3.125 MG TOTAL) BY MOUTH DAILY. 11/05/16   Pricilla Riffle, MD  cholecalciferol (VITAMIN D) 1000 UNITS tablet Take 1,000 Units by mouth daily.    [provider]  clopidogrel (PLAVIX) 75 MG tablet TAKE ONE TABLET BY MOUTH DAILY WITH BREAKFAST 02/23/17   Pricilla Riffle, MD  fluticasone [facility]) 50 MCG/ACT nasal spray Place 1 spray into both nostrils daily. 07/27/16   [provider]  furosemide (LASIX) 20 MG tablet Take 2 tablets (40 mg total) by mouth daily. Take 2 tablets (40 mg) every other day 02/18/17   Pricilla Riffle, MD  hydrALAZINE (APRESOLINE) 25 MG tablet Take 2 tablets (50 mg total) by mouth 3 (three) times daily. 12/31/16   Pricilla Riffle, MD  KLOR-CON M10 10 MEQ tablet TAKE 1 TABLET (10 MEQ TOTAL) BY MOUTH DAILY. 01/18/17   [provider]  lisinopril (PRINIVIL,ZESTRIL) 20 MG tablet Take 1 tablet (20 mg total) by mouth daily. 02/18/17   Pricilla Riffle, MD  Multiple Vitamins-Minerals (PRESERVISION AREDS) CAPS Take 1 capsule by mouth daily.     [provider]    Family History Family History  Problem Relation Age of Onset  . Cancer Mother        [name]  .  Diabetes Sister   . Cancer Sister   . Hyperlipidemia Sister   . Heart attack Sister   . Hypertension Sister   . Cancer Sister        Breast  . Hyperlipidemia Sister   . Diabetes Son   . Hyperlipidemia Daughter   . Hypertension Daughter     Social History Social History  Substance Use Topics  . Smoking status: Former Smoker    Quit date: 08/24/2009  . Smokeless tobacco: Never Used  . Alcohol use No     Allergies   Penicillins; Clonidine  derivatives; and Maxzide [hydrochlorothiazide w-triamterene]   Review of Systems Review of Systems  Respiratory: Positive for shortness of breath.   All other systems reviewed and are negative.    Physical Exam Updated Vital Signs BP (!) 152/72   Pulse 69   Temp 97.8 F (36.6 C) (Oral)   Resp 19   Ht 5\' 7"  (1.702 m)   Wt 55.8 kg (123 lb)   SpO2 91%   BMI 19.26 kg/m   Physical Exam  Constitutional: She appears well-developed and well-nourished.  HENT:  Head: Normocephalic and atraumatic.  Cardiovascular: Normal rate and regular rhythm.   No murmur heard. Pulmonary/Chest: Effort normal. No respiratory distress. She exhibits no tenderness.  Decreased air movement bilateral bases.  Abdominal: Soft. There is no tenderness. There is no rebound and no guarding.  Mild right CVA tenderness  Musculoskeletal: She exhibits no tenderness.  Trace edema to bilateral lower extremities  Neurological: She is alert.  Mildly confused  Skin: Skin is warm and dry.  Psychiatric: She has a normal mood and affect. Her behavior is normal.  Nursing note and vitals reviewed.    ED Treatments / Results  Labs (all labs ordered are listed, but only abnormal results are displayed) Labs Reviewed  COMPREHENSIVE METABOLIC PANEL - Abnormal; Notable for the following:       Result Value   CO2 21 (*)    Glucose, Bld 100 (*)    BUN 37 (*)    Creatinine, Ser 2.09 (*)    Calcium 8.3 (*)    ALT 12 (*)    GFR calc non Af Amer 21 (*)    GFR calc Af  Amer 24 (*)    All other components within normal limits  CBC WITH DIFFERENTIAL/PLATELET - Abnormal; Notable for the following:    RBC 3.86 (*)    Hemoglobin 11.2 (*)    HCT 35.3 (*)    RDW 15.8 (*)    All other components within normal limits  URINALYSIS, ROUTINE W REFLEX MICROSCOPIC - Abnormal; Notable for the following:    Protein, ur >300 (*)    All other components within normal limits  TROPONIN I - Abnormal; Notable for the following:    Troponin I 0.03 (*)    All other components within normal limits  BRAIN NATRIURETIC PEPTIDE - Abnormal; Notable for the following:    B Natriuretic Peptide 1,412.9 (*)    All other components within normal limits  URINALYSIS, MICROSCOPIC (REFLEX) - Abnormal; Notable for the following:    Bacteria, UA MANY (*)    Squamous Epithelial / LPF 0-5 (*)    All other components within normal limits  TROPONIN I - Abnormal; Notable for the following:    Troponin I 0.03 (*)    All other components within normal limits  URINE CULTURE    EKG  EKG Interpretation  Date/Time:  Monday April 19 2017 12:35:38 EDT Ventricular Rate:  67 PR Interval:    QRS Duration: 87 QT Interval:  439 QTC Calculation: 464 R Axis:   50 Text Interpretation:  Sinus rhythm Abnormal T, consider ischemia, diffuse leads Confirmed by Tilden Fossa [phone]) on 04/19/2017 12:38:24 PM Also confirmed by Tilden Fossa [phone]), editor Misty Stanley [phone])  on 04/19/2017 1:37:20 PM       Radiology Dg Chest 2 View  Result Date: 04/19/2017 CLINICAL DATA:  Dyspnea EXAM: CHEST  2 VIEW COMPARISON:  12/21/2016 chest radiograph. FINDINGS: Stable cardiomediastinal silhouette with mild cardiomegaly and aortic atherosclerosis. No pneumothorax. Small  stable left pleural effusion. Trace stable right pleural effusion. No overt pulmonary edema. Stable left greater than right lung base opacities. No acute consolidative airspace disease. IMPRESSION: 1. Chronic stable small left and trace  right pleural effusions. 2. Stable cardiomegaly without overt pulmonary edema. 3. Stable left greater than right lung base opacities, favor scarring or atelectasis. Electronically Signed   By: Delbert Phenix M.D.   On: 04/19/2017 13:04    Procedures Procedures (including critical care time)  Medications Ordered in ED Medications  hydrALAZINE (APRESOLINE) tablet 50 mg (50 mg Oral Given 04/19/17 1528)     Initial Impression / Assessment and Plan / ED Course  I have reviewed the triage vital signs and the nursing notes.  Pertinent labs & imaging results that were available during my care of the patient were reviewed by me and considered in my medical decision making (see chart for details).     Patient with history of CHF here for evaluation of shortness of breath over the last week. She does have some intermittent right-sided pain. She has no respiratory distress the emergency Department management is able to ambulate without difficulty and without hypoxia. EKG is unchanged compared to priors. BNP is consistent with CHF, similar but slightly elevated compared to priors. She has stable renal insufficiency. She does have a mild elevation in her troponin consistent with her CHF and renal insufficiency. There is no evidence of acute decompensated congestive heart failure, acute MI, PE, pneumonia. Plan to DC home with close outpatient follow-up with cardiology. Return precautions discussed.  UA with bacteria present the patient without dysuria, urinary frequency, odor or change in urine quality. Will send cultures.  Final Clinical Impressions(s) / ED Diagnoses   Final diagnoses:  Shortness of breath    New Prescriptions New Prescriptions   No medications on file     Tilden Fossa, MD 04/19/17 [phone]

## 2017-04-20 LAB — URINE CULTURE: Culture: <10000 — AB

## 2017-04-28 ENCOUNTER — Ambulatory Visit (HOSPITAL_BASED_OUTPATIENT_CLINIC_OR_DEPARTMENT_OTHER): Payer: Medicare Other | Admitting: Hematology & Oncology

## 2017-04-28 ENCOUNTER — Other Ambulatory Visit (HOSPITAL_BASED_OUTPATIENT_CLINIC_OR_DEPARTMENT_OTHER): Payer: Medicare Other

## 2017-04-28 ENCOUNTER — Ambulatory Visit (HOSPITAL_BASED_OUTPATIENT_CLINIC_OR_DEPARTMENT_OTHER): Payer: Medicare Other

## 2017-04-28 VITALS — BP 143/64 | HR 66 | Temp 98.0°F | Resp 18 | Wt 129.0 lb

## 2017-04-28 DIAGNOSIS — N183 Chronic kidney disease, stage 3 unspecified: Secondary | ICD-10-CM

## 2017-04-28 DIAGNOSIS — D510 Vitamin B12 deficiency anemia due to intrinsic factor deficiency: Secondary | ICD-10-CM

## 2017-04-28 DIAGNOSIS — D500 Iron deficiency anemia secondary to blood loss (chronic): Secondary | ICD-10-CM

## 2017-04-28 DIAGNOSIS — D631 Anemia in chronic kidney disease: Secondary | ICD-10-CM | POA: Diagnosis not present

## 2017-04-28 DIAGNOSIS — K909 Intestinal malabsorption, unspecified: Secondary | ICD-10-CM

## 2017-04-28 LAB — COMPREHENSIVE METABOLIC PANEL
ALT: 11 U/L (ref 0–55)
AST: 20 U/L (ref 5–34)
Albumin: 3 g/dL — ABNORMAL LOW (ref 3.5–5.0)
Alkaline Phosphatase: 83 U/L (ref 40–150)
Anion Gap: 8 mEq/L (ref 3–11)
BUN: 27.3 mg/dL — ABNORMAL HIGH (ref 7.0–26.0)
CALCIUM: 8.5 mg/dL (ref 8.4–10.4)
CHLORIDE: 111 meq/L — AB (ref 98–109)
CO2: 22 mEq/L (ref 22–29)
CREATININE: 2.2 mg/dL — AB (ref 0.6–1.1)
EGFR: 20 mL/min/{1.73_m2} — AB (ref >90)
Glucose: 159 mg/dl — ABNORMAL HIGH (ref 70–140)
POTASSIUM: 4.8 meq/L (ref 3.5–5.1)
Sodium: 142 mEq/L (ref 136–145)
Total Bilirubin: 0.51 mg/dL (ref 0.20–1.20)
Total Protein: 6.1 g/dL — ABNORMAL LOW (ref 6.4–8.3)

## 2017-04-28 LAB — CBC WITH DIFFERENTIAL (CANCER CENTER ONLY)
BASO#: 0.1 10*3/uL (ref 0.0–0.2)
BASO%: 1.8 % (ref 0.0–2.0)
EOS ABS: 0.2 10*3/uL (ref 0.0–0.5)
EOS%: 2.6 % (ref 0.0–7.0)
HEMATOCRIT: 33.8 % — AB (ref 34.8–46.6)
HGB: 10.8 g/dL — ABNORMAL LOW (ref 11.6–15.9)
LYMPH#: 0.8 10*3/uL — AB (ref 0.9–3.3)
LYMPH%: 10.9 % — ABNORMAL LOW (ref 14.0–48.0)
MCH: 29.8 pg (ref 26.0–34.0)
MCHC: 32 g/dL (ref 32.0–36.0)
MCV: 93 fL (ref 81–101)
MONO#: 0.4 10*3/uL (ref 0.1–0.9)
MONO%: 5.9 % (ref 0.0–13.0)
NEUT#: 5.8 10*3/uL (ref 1.5–6.5)
NEUT%: 78.8 % (ref 39.6–80.0)
PLATELETS: 244 10*3/uL (ref 145–400)
RBC: 3.63 10*6/uL — ABNORMAL LOW (ref 3.70–5.32)
RDW: 15.2 % (ref 11.1–15.7)
WBC: 7.3 10*3/uL (ref 3.9–10.0)

## 2017-04-28 LAB — IRON AND TIBC
%SAT: 33 % (ref 21–57)
IRON: 74 ug/dL (ref 41–142)
TIBC: 224 ug/dL — ABNORMAL LOW (ref 236–444)
UIBC: 149 ug/dL (ref 120–384)

## 2017-04-28 LAB — FERRITIN: FERRITIN: 157 ng/mL (ref 9–269)

## 2017-04-28 MED ORDER — DARBEPOETIN ALFA 300 MCG/0.6ML IJ SOSY
300.0000 ug | PREFILLED_SYRINGE | Freq: Once | INTRAMUSCULAR | Status: AC
  Administered 2017-04-28: 300 ug via SUBCUTANEOUS

## 2017-04-28 MED ORDER — DARBEPOETIN ALFA 300 MCG/0.6ML IJ SOSY
PREFILLED_SYRINGE | INTRAMUSCULAR | Status: AC
  Filled 2017-04-28: qty 0.6

## 2017-04-28 NOTE — Progress Notes (Signed)
Hematology and Oncology Follow Up Visit  [name] [id number] February 23, 1931 83 y.o. 04/28/2017   Principle Diagnosis:  Anemia secondary to chronic renal insufficiency and erythropoietin deficiency  Iron def anemia Pernicious anemia   Current Therapy:   Aranesp 300 mcg SQ  q 3 weeks for Hgb < 11 Vit B 12 1000 mcg IM prn IV iron as needed - last dose given on 11/03/2016    Interim History:  [name] is here today with her daughter for follow-up . She is doing okay. She is having some breathing difficulties because of the she Mittie. This is no surprise. I think she will see her heart doctor again.  She was last here I think back in July. At that time, her iron studies showed a ferritin of 180 with iron saturation of 41%.   She is on blood thinner. She is on Plavix. She does have quite a bit of bruising.  She's had no fever. She's had no nausea or vomiting. She's had no obvious change in bowel or bladder habits.  Overall, her performance status is ECOG 2   Medications:  Allergies as of 04/28/2017      Reactions   Penicillins Swelling, Rash   Has patient had a PCN reaction causing immediate rash, facial/tongue/throat swelling, SOB or lightheadedness with hypotension: Unknown Has patient had a PCN reaction causing severe rash involving mucus membranes or skin necrosis: No Has patient had a PCN reaction that required hospitalization No Has patient had a PCN reaction occurring within the last 10 years: No If all of the above answers are "NO", then may proceed with Cephalosporin use.   Clonidine Derivatives Other (See Comments)   Fatigue Bradycardia   Maxzide [hydrochlorothiazide W-triamterene] Other (See Comments)   hyponatremia      Medication List       Accurate as of 04/28/17 11:52 AM. Always use your most recent med list.          amLODipine 5 MG tablet Commonly known as:  NORVASC TAKE ONE TABLET BY MOUTH TWICE DAILY   aspirin 81 MG tablet Take 81 mg by mouth  daily.   atorvastatin 20 MG tablet Commonly known as:  LIPITOR TAKE 1 TABLET (20 MG TOTAL) BY MOUTH DAILY.   CALCIUM PO Take 1 tablet by mouth daily.   carvedilol 3.125 MG tablet Commonly known as:  COREG TAKE 1 TABLET (3.125 MG TOTAL) BY MOUTH DAILY.   cholecalciferol 1000 units tablet Commonly known as:  VITAMIN D Take 1,000 Units by mouth daily.   clopidogrel 75 MG tablet Commonly known as:  PLAVIX TAKE ONE TABLET BY MOUTH DAILY WITH BREAKFAST   fluticasone 50 MCG/ACT nasal spray Commonly known as:  FLONASE Place 1 spray into both nostrils daily.   furosemide 20 MG tablet Commonly known as:  LASIX Take 2 tablets (40 mg total) by mouth daily. Take 2 tablets (40 mg) every other day   hydrALAZINE 25 MG tablet Commonly known as:  APRESOLINE Take 2 tablets (50 mg total) by mouth 3 (three) times daily.   PRESERVISION AREDS Caps Take 1 capsule by mouth daily.       Allergies:  Allergies  Allergen Reactions  . Penicillins Swelling and Rash    Has patient had a PCN reaction causing immediate rash, facial/tongue/throat swelling, SOB or lightheadedness with hypotension: Unknown Has patient had a PCN reaction causing severe rash involving mucus membranes or skin necrosis: No Has patient had a PCN reaction that required hospitalization No Has patient  had a PCN reaction occurring within the last 10 years: No If all of the above answers are "NO", then may proceed with Cephalosporin use.   . Clonidine Derivatives Other (See Comments)    Fatigue Bradycardia   . Maxzide [Hydrochlorothiazide W-Triamterene] Other (See Comments)    hyponatremia    Past Medical History, Surgical history, Social history, and Family History were reviewed and updated.  Review of Systems: As stated in the interim history  Physical Exam:  weight is 129 lb (58.5 kg). Her oral temperature is 98 F (36.7 C). Her blood pressure is 143/64 (abnormal) and her pulse is 66. Her respiration is 18 and  oxygen saturation is 97%.   Wt Readings from Last 3 Encounters:  04/28/17 129 lb (58.5 kg)  04/19/17 123 lb (55.8 kg)  03/25/17 123 lb (55.8 kg)    Physical Exam  Constitutional: She is oriented to person, place, and time.  HENT:  Head: Normocephalic and atraumatic.  Mouth/Throat: Oropharynx is clear and moist.  Eyes: Pupils are equal, round, and reactive to light. EOM are normal.  Neck: Normal range of motion.  Cardiovascular: Normal rate, regular rhythm and normal heart sounds.   Pulmonary/Chest: Effort normal and breath sounds normal.  Abdominal: Soft. Bowel sounds are normal.  Musculoskeletal: Normal range of motion. She exhibits no edema, tenderness or deformity.  Lymphadenopathy:    She has no cervical adenopathy.  Neurological: She is alert and oriented to person, place, and time.  Skin: Skin is warm and dry. No rash noted. No erythema.  Psychiatric: She has a normal mood and affect. Her behavior is normal. Judgment and thought content normal.  Vitals reviewed.    Lab Results  Component Value Date   WBC 7.3 04/28/2017   HGB 10.8 (L) 04/28/2017   HCT 33.8 (L) 04/28/2017   MCV 93 04/28/2017   PLT 244 04/28/2017   Lab Results  Component Value Date   FERRITIN 180 03/25/2017   IRON 94 03/25/2017   TIBC 228 (L) 03/25/2017   UIBC 134 03/25/2017   IRONPCTSAT 41 03/25/2017   Lab Results  Component Value Date   RETICCTPCT 1.3 05/11/2014   RBC 3.63 (L) 04/28/2017   RETICCTABS 44.6 05/11/2014   No results found for: KPAFRELGTCHN, LAMBDASER, KAPLAMBRATIO No results found for: [facility] Lab Results  Component Value Date   MSPIKE Not Observed 06/01/2016     Chemistry      Component Value Date/Time   NA 141 04/19/2017 1229   NA 142 03/25/2017 1132   K 4.8 04/19/2017 1229   K 5.1 03/25/2017 1132   CL 110 04/19/2017 1229   CL 110 (H) 02/09/2017 1104   CO2 21 (L) 04/19/2017 1229   CO2 22 03/25/2017 1132   BUN 37 (H) 04/19/2017 1229   BUN 26.7 (H)  03/25/2017 1132   CREATININE 2.09 (H) 04/19/2017 1229   CREATININE 1.9 (H) 03/25/2017 1132      Component Value Date/Time   CALCIUM 8.3 (L) 04/19/2017 1229   CALCIUM 8.8 03/25/2017 1132   ALKPHOS 77 04/19/2017 1229   ALKPHOS 83 03/25/2017 1132   AST 20 04/19/2017 1229   AST 20 03/25/2017 1132   ALT 12 (L) 04/19/2017 1229   ALT 11 03/25/2017 1132   BILITOT 0.4 04/19/2017 1229   BILITOT 0.44 03/25/2017 1132     Impression and Plan: Ms. Mccarrell is an 83 year old white female. She has multifactorial anemia.  We will give her Aranesp today. I'm somewhat surprised that her hemoglobin  is not a little bit higher. Given that she is on Plavix, is possible she may have some chronic bleeding.  We will see what her iron studies show. We may have to give her some IV iron.  I will like to see her back in 6 weeks.   Josph Macho, MD 9/5/201811:52 AM

## 2017-04-29 LAB — VITAMIN B12: Vitamin B12: 319 pg/mL (ref 232–1245)

## 2017-05-14 ENCOUNTER — Ambulatory Visit: Payer: Medicare Other | Admitting: Internal Medicine

## 2017-05-14 ENCOUNTER — Encounter: Payer: Self-pay | Admitting: Internal Medicine

## 2017-05-14 VITALS — BP 124/52 | HR 81 | Ht 67.0 in | Wt 127.0 lb

## 2017-05-14 DIAGNOSIS — E782 Mixed hyperlipidemia: Secondary | ICD-10-CM

## 2017-05-14 DIAGNOSIS — I5031 Acute diastolic (congestive) heart failure: Secondary | ICD-10-CM | POA: Diagnosis not present

## 2017-05-14 DIAGNOSIS — I10 Essential (primary) hypertension: Secondary | ICD-10-CM

## 2017-05-14 NOTE — Progress Notes (Signed)
Cardiology Office Note   Date:  05/14/2017   ID:  MERTICE UFFELMAN, DOB 07/08/1931, MRN [id number]  PCP:  Daisy Floro, MD  Cardiologist:   Dietrich Pates, MD   F/U of diastolic CHF     History of Present Illness: [name] is a 83 y.o. female with a history of HTN, CVdz and pleural effusion (L) She has undergone thoracentesis for this in past  Transudative   Echo with vigorous LV function    [facility]   Not feeling good  HR and BP OK  Sats good  CXR with some effusion  Sent home   Daughter says she was wanting to lean over  Uncomfortable  Trouble breathing ?  She cant remember   Trouble walking around    Since then she has been feeling pretty good  No CP  Breathing is fair      Current Meds  Medication Sig  . amLODipine (NORVASC) 5 MG tablet TAKE ONE TABLET BY MOUTH TWICE DAILY  . aspirin 81 MG tablet Take 81 mg by mouth daily.    Marland Kitchen atorvastatin (LIPITOR) 20 MG tablet TAKE 1 TABLET (20 MG TOTAL) BY MOUTH DAILY.  Marland Kitchen CALCIUM PO Take 1 tablet by mouth daily.  . carvedilol (COREG) 3.125 MG tablet TAKE 1 TABLET (3.125 MG TOTAL) BY MOUTH DAILY.  . cholecalciferol (VITAMIN D) 1000 UNITS tablet Take 1,000 Units by mouth daily.  . clopidogrel (PLAVIX) 75 MG tablet TAKE ONE TABLET BY MOUTH DAILY WITH BREAKFAST  . fluticasone (FLONASE) 50 MCG/ACT nasal spray Place 1 spray into both nostrils daily.  . furosemide (LASIX) 20 MG tablet Take 2 tablets (40 mg total) by mouth daily. Take 2 tablets (40 mg) every other day  . hydrALAZINE (APRESOLINE) 25 MG tablet Take 2 tablets (50 mg total) by mouth 3 (three) times daily.  . Multiple Vitamins-Minerals (PRESERVISION AREDS) CAPS Take 1 capsule by mouth daily.      Allergies:   Penicillins; Clonidine derivatives; and Maxzide [hydrochlorothiazide w-triamterene]   Past Medical History:  Diagnosis Date  . Anemia    a. "On/off my whole life" - etiology unclear. Has never required blood transfusion.  .  Anemia associated with stage 3 chronic renal failure 06/02/2016  . Arthritis   . CAD (coronary artery disease)   . Carotid artery occlusion    RICA 80-99%, LICA 40-59% by dopplers 06/2013. Also with cerebrovascular disease otherwise on MRA 06/2013.  . Carotid artery stenosis   . Chronic kidney disease   . Chronic renal insufficiency, stage 3 (moderate) 06/02/2016  . Depression   . Dizziness    a. Adm 06/2013: felt due to posterior circulation deficits from vertebrobasilar insufficiency and possibly vertigo.  Marland Kitchen DVT (deep venous thrombosis) (HCC)    a. Around age 43, details unclear (SVT vs DVT?), s/p vein stripping without recurrence.  . Erythropoietin deficiency anemia 06/02/2016  . Heart murmur    a. Longstanding. mild MR on prior ECG, also may be in setting of known vasc dz.  . Hiatal hernia   . Hyperlipidemia   . Hypertension    a. Intolerant to Maxzide due to hyponatremia. b. intolerant to Clonidine due to slow HR/fatigue. c. Discrepancy in BP felt due to innominant stenosis.  . Iron deficiency anemia due to chronic blood loss 09/08/2016  . Iron malabsorption 09/08/2016  . Macular degeneration    In both eyes - no longer drives.  . Macular  degeneration    L- macular degeneration, R eye impaired as well  . Multiple thyroid nodules    a. By [place] 06/2013. Bx recommended.  . Pernicious anemia 06/02/2016  . PFO (patent foramen ovale)    a. Per echo in 12/2010.  Marland Kitchen PVD (peripheral vascular disease) (HCC)    a. R innominant stenosis. b. 06/2013 ABI: 0.80 R, 0.50 L., DVT- 1970's  . Varicose veins     Past Surgical History:  Procedure Laterality Date  . ABDOMINAL HYSTERECTOMY  1971  . APPENDECTOMY    . BLADDER SURGERY  2004   baldder sling  . CATARACT EXTRACTION    . CESAREAN SECTION     X1  . ENDARTERECTOMY Right 10/31/2015   Procedure: RIGHT CAROTID ENDARTERECTOMY;  Surgeon: Nada Libman, MD;  [facility];  Service: Vascular;  Laterality: Right;  . EYE SURGERY      cataracts removed, /w IOL  . INCONTINENCE SURGERY    . IR THORACENTESIS ASP PLEURAL SPACE W/IMG GUIDE  11/23/2016  . LUMBAR DISC SURGERY    . Neck Muscle Surgery    . SPINE SURGERY    . TOE SURGERY Right    great toe with pins  . VARICOSE VEIN SURGERY     left leg     Social History:  The patient  reports that she quit smoking about 7 years ago. She has never used smokeless tobacco. She reports that she does not drink alcohol or use drugs.   Family History:  The patient's family history includes Cancer in her mother, sister, and sister; Diabetes in her sister and son; Heart attack in her sister; Hyperlipidemia in her daughter, sister, and sister; Hypertension in her daughter and sister.    ROS:  Please see the history of present illness. All other systems are reviewed and  Negative to the above problem except as noted.    PHYSICAL EXAM: VS:  BP (!) 124/52 (BP Location: Right Arm, Patient Position: Sitting, Cuff Size: Normal)   Pulse 81   Ht  (1.702 m)   Wt 127 lb (57.6 kg)   SpO2 94%   BMI 19.89 kg/m   GEN: Well nourished, well developed, in no acute distress  HEENT: normal  Neck: JVP normal   Cardiac:JVP normal  ; II/VI systolic murmur base  , rubs, or gallops,no edema  Respiratory:  clear to auscultation bilaterally, normal work of breathing Decreased BS at L base   GI: soft, nontender, nondistended, + BS  No hepatomegaly  MS: no deformity Moving all extremities   Skin: warm and dry, no rash Neuro:  Strength and sensation are intact Psych: euthymic mood, full affect   EKG:  EKG is not ordered today.   Lipid Panel    Component Value Date/Time   CHOL 134 02/04/2017 0820   CHOL 133 02/17/2013 0947   TRIG 53 02/04/2017 0820   TRIG CANCELED 02/19/2014 1255   TRIG 65 02/17/2013 0947   HDL 75 02/04/2017 0820   HDL CANCELED 02/19/2014 1255   HDL 58 02/17/2013 0947   CHOLHDL 1.8 02/04/2017 0820   CHOLHDL 2.4 12/12/2015 1449   VLDL 15 12/12/2015 1449   LDLCALC 48  02/04/2017 0820   LDLCALC 95 02/19/2014 0000   LDLCALC 62 02/17/2013 0947      Wt Readings from Last 3 Encounters:  05/14/17 127 lb (57.6 kg)  04/28/17 129 lb (58.5 kg)  04/19/17 123 lb (55.8 kg)      ASSESSMENT AND PLAN:  1  Diastolic CHF   VOlume status is not bad  Small effusion appears to be present  I would keep on same regimn 2  HTN  [place] control    3  CV dz  Followed by Janae Bridgeman  4  HL continue lipitor    Current medicines are reviewed at length with the patient today.  The patient does not have concerns regarding medicines.  Signed, Dietrich Pates, MD  05/14/2017 4:55 PM    [facility] [address] [place], [place], [place]  [address] Phone: [phone]; Fax: [phone]

## 2017-05-14 NOTE — Patient Instructions (Signed)
Your physician recommends that you continue on your current medications as directed. Please refer to the Current Medication list given to you today. Your physician recommends that you schedule a follow-up appointment in: January, 2019 with Dr. Tenny Craw.

## 2017-05-27 ENCOUNTER — Other Ambulatory Visit: Payer: Self-pay

## 2017-05-27 DIAGNOSIS — I6529 Occlusion and stenosis of unspecified carotid artery: Secondary | ICD-10-CM

## 2017-06-02 ENCOUNTER — Telehealth: Payer: Self-pay | Admitting: Internal Medicine

## 2017-06-02 NOTE — Telephone Encounter (Signed)
Spoke with pt's daughter and pt has increase in symptoms just can't breath lying flat has been watching salt intake .Currently taking Furosemide 40 mg every 5 days due to kidney functions.Discussed with Tereso Newcomer pa and if symptoms are worse than previous office visit than needs to go to er.Per daughter these symptoms are worse than before .Daughter agrees with plan will call sister and let her know of plan and will proceed to ED /cy

## 2017-06-02 NOTE — Telephone Encounter (Signed)
New Message  Pt c/o medication issue:  1. Name of Medication: lasix  2. How are you currently taking this medication (dosage and times per day)?    3. Are you having a reaction (difficulty breathing--STAT)? Per pt daughter states pt is having a hard time breathing and sleeping at night.   4. What is your medication issue? Pt daughter would like to speak with Rn please call back to discuss

## 2017-06-05 ENCOUNTER — Emergency Department (HOSPITAL_BASED_OUTPATIENT_CLINIC_OR_DEPARTMENT_OTHER): Payer: Medicare Other

## 2017-06-05 ENCOUNTER — Inpatient Hospital Stay (HOSPITAL_BASED_OUTPATIENT_CLINIC_OR_DEPARTMENT_OTHER)
Admission: EM | Admit: 2017-06-05 | Discharge: 2017-06-11 | DRG: 291 | Disposition: A | Discharge status: Home Health | Payer: Medicare Other | Attending: Internal Medicine | Admitting: Internal Medicine

## 2017-06-05 ENCOUNTER — Encounter (HOSPITAL_BASED_OUTPATIENT_CLINIC_OR_DEPARTMENT_OTHER): Payer: Self-pay | Admitting: *Deleted

## 2017-06-05 DIAGNOSIS — Z7951 Long term (current) use of inhaled steroids: Secondary | ICD-10-CM | POA: Diagnosis not present

## 2017-06-05 DIAGNOSIS — M8580 Other specified disorders of bone density and structure, unspecified site: Secondary | ICD-10-CM | POA: Diagnosis present

## 2017-06-05 DIAGNOSIS — Z833 Family history of diabetes mellitus: Secondary | ICD-10-CM | POA: Diagnosis not present

## 2017-06-05 DIAGNOSIS — R0609 Other forms of dyspnea: Secondary | ICD-10-CM | POA: Diagnosis not present

## 2017-06-05 DIAGNOSIS — E875 Hyperkalemia: Secondary | ICD-10-CM | POA: Diagnosis not present

## 2017-06-05 DIAGNOSIS — E785 Hyperlipidemia, unspecified: Secondary | ICD-10-CM | POA: Diagnosis present

## 2017-06-05 DIAGNOSIS — I129 Hypertensive chronic kidney disease with stage 1 through stage 4 chronic kidney disease, or unspecified chronic kidney disease: Secondary | ICD-10-CM | POA: Diagnosis not present

## 2017-06-05 DIAGNOSIS — H3530 Unspecified macular degeneration: Secondary | ICD-10-CM | POA: Diagnosis present

## 2017-06-05 DIAGNOSIS — Z9889 Other specified postprocedural states: Secondary | ICD-10-CM | POA: Diagnosis not present

## 2017-06-05 DIAGNOSIS — N179 Acute kidney failure, unspecified: Secondary | ICD-10-CM | POA: Diagnosis present

## 2017-06-05 DIAGNOSIS — I739 Peripheral vascular disease, unspecified: Secondary | ICD-10-CM | POA: Diagnosis present

## 2017-06-05 DIAGNOSIS — Z86718 Personal history of other venous thrombosis and embolism: Secondary | ICD-10-CM | POA: Diagnosis not present

## 2017-06-05 DIAGNOSIS — N183 Chronic kidney disease, stage 3 unspecified: Secondary | ICD-10-CM | POA: Diagnosis present

## 2017-06-05 DIAGNOSIS — I130 Hypertensive heart and chronic kidney disease with heart failure and stage 1 through stage 4 chronic kidney disease, or unspecified chronic kidney disease: Secondary | ICD-10-CM | POA: Diagnosis present

## 2017-06-05 DIAGNOSIS — N184 Chronic kidney disease, stage 4 (severe): Secondary | ICD-10-CM | POA: Diagnosis present

## 2017-06-05 DIAGNOSIS — D510 Vitamin B12 deficiency anemia due to intrinsic factor deficiency: Secondary | ICD-10-CM | POA: Diagnosis not present

## 2017-06-05 DIAGNOSIS — Z8249 Family history of ischemic heart disease and other diseases of the circulatory system: Secondary | ICD-10-CM

## 2017-06-05 DIAGNOSIS — Z515 Encounter for palliative care: Secondary | ICD-10-CM

## 2017-06-05 DIAGNOSIS — Z809 Family history of malignant neoplasm, unspecified: Secondary | ICD-10-CM | POA: Diagnosis not present

## 2017-06-05 DIAGNOSIS — Z8349 Family history of other endocrine, nutritional and metabolic diseases: Secondary | ICD-10-CM | POA: Diagnosis not present

## 2017-06-05 DIAGNOSIS — D631 Anemia in chronic kidney disease: Secondary | ICD-10-CM | POA: Diagnosis not present

## 2017-06-05 DIAGNOSIS — R0602 Shortness of breath: Secondary | ICD-10-CM

## 2017-06-05 DIAGNOSIS — Z7982 Long term (current) use of aspirin: Secondary | ICD-10-CM

## 2017-06-05 DIAGNOSIS — J9601 Acute respiratory failure with hypoxia: Secondary | ICD-10-CM | POA: Diagnosis present

## 2017-06-05 DIAGNOSIS — Z888 Allergy status to other drugs, medicaments and biological substances status: Secondary | ICD-10-CM

## 2017-06-05 DIAGNOSIS — N189 Chronic kidney disease, unspecified: Secondary | ICD-10-CM

## 2017-06-05 DIAGNOSIS — Z9981 Dependence on supplemental oxygen: Secondary | ICD-10-CM

## 2017-06-05 DIAGNOSIS — R0600 Dyspnea, unspecified: Secondary | ICD-10-CM

## 2017-06-05 DIAGNOSIS — E46 Unspecified protein-calorie malnutrition: Secondary | ICD-10-CM | POA: Diagnosis present

## 2017-06-05 DIAGNOSIS — I2510 Atherosclerotic heart disease of native coronary artery without angina pectoris: Secondary | ICD-10-CM | POA: Diagnosis present

## 2017-06-05 DIAGNOSIS — I422 Other hypertrophic cardiomyopathy: Secondary | ICD-10-CM | POA: Diagnosis present

## 2017-06-05 DIAGNOSIS — Z681 Body mass index (BMI) 19 or less, adult: Secondary | ICD-10-CM | POA: Diagnosis not present

## 2017-06-05 DIAGNOSIS — Z7902 Long term (current) use of antithrombotics/antiplatelets: Secondary | ICD-10-CM

## 2017-06-05 DIAGNOSIS — I509 Heart failure, unspecified: Secondary | ICD-10-CM | POA: Diagnosis not present

## 2017-06-05 DIAGNOSIS — Z87891 Personal history of nicotine dependence: Secondary | ICD-10-CM

## 2017-06-05 DIAGNOSIS — I10 Essential (primary) hypertension: Secondary | ICD-10-CM | POA: Diagnosis present

## 2017-06-05 DIAGNOSIS — Z90710 Acquired absence of both cervix and uterus: Secondary | ICD-10-CM

## 2017-06-05 DIAGNOSIS — I6529 Occlusion and stenosis of unspecified carotid artery: Secondary | ICD-10-CM | POA: Diagnosis present

## 2017-06-05 DIAGNOSIS — Q211 Atrial septal defect: Secondary | ICD-10-CM

## 2017-06-05 DIAGNOSIS — J90 Pleural effusion, not elsewhere classified: Secondary | ICD-10-CM | POA: Diagnosis not present

## 2017-06-05 DIAGNOSIS — Z66 Do not resuscitate: Secondary | ICD-10-CM | POA: Diagnosis not present

## 2017-06-05 DIAGNOSIS — Z9849 Cataract extraction status, unspecified eye: Secondary | ICD-10-CM | POA: Diagnosis not present

## 2017-06-05 DIAGNOSIS — I340 Nonrheumatic mitral (valve) insufficiency: Secondary | ICD-10-CM | POA: Diagnosis present

## 2017-06-05 DIAGNOSIS — Z7189 Other specified counseling: Secondary | ICD-10-CM | POA: Diagnosis not present

## 2017-06-05 DIAGNOSIS — Z880 Allergy status to penicillin: Secondary | ICD-10-CM

## 2017-06-05 DIAGNOSIS — I5033 Acute on chronic diastolic (congestive) heart failure: Secondary | ICD-10-CM | POA: Diagnosis present

## 2017-06-05 LAB — CBC
HEMATOCRIT: 37.8 % (ref 36.0–46.0)
HEMOGLOBIN: 12.2 g/dL (ref 12.0–15.0)
MCH: 27.9 pg (ref 26.0–34.0)
MCHC: 32.3 g/dL (ref 30.0–36.0)
MCV: 86.5 fL (ref 78.0–100.0)
PLATELETS: 256 10*3/uL (ref 150–400)
RBC: 4.37 MIL/uL (ref 3.87–5.11)
RDW: 15.7 % — ABNORMAL HIGH (ref 11.5–15.5)
WBC: 7.8 10*3/uL (ref 4.0–10.5)

## 2017-06-05 LAB — BASIC METABOLIC PANEL
ANION GAP: 9 (ref 5–15)
Anion gap: 10 (ref 5–15)
BUN: 30 mg/dL — ABNORMAL HIGH (ref 6–20)
BUN: 29 mg/dL — AB (ref 6–20)
CALCIUM: 7.9 mg/dL — AB (ref 8.9–10.3)
CHLORIDE: 107 mmol/L (ref 101–111)
CO2: 22 mmol/L (ref 22–32)
CO2: 20 mmol/L — AB (ref 22–32)
Calcium: 8.6 mg/dL — ABNORMAL LOW (ref 8.9–10.3)
Chloride: 106 mmol/L (ref 101–111)
Creatinine, Ser: 2.22 mg/dL — ABNORMAL HIGH (ref 0.44–1.00)
Creatinine, Ser: 2.16 mg/dL — ABNORMAL HIGH (ref 0.44–1.00)
GFR calc Af Amer: 23 mL/min — ABNORMAL LOW (ref >60)
GFR calc non Af Amer: 19 mL/min — ABNORMAL LOW (ref >60)
GFR, EST AFRICAN AMERICAN: 22 mL/min — AB (ref >60)
GFR, EST NON AFRICAN AMERICAN: 20 mL/min — AB (ref >60)
GLUCOSE: 101 mg/dL — AB (ref 65–99)
Glucose, Bld: 127 mg/dL — ABNORMAL HIGH (ref 65–99)
POTASSIUM: 4.2 mmol/L (ref 3.5–5.1)
Potassium: 4 mmol/L (ref 3.5–5.1)
SODIUM: 138 mmol/L (ref 135–145)
Sodium: 136 mmol/L (ref 135–145)

## 2017-06-05 LAB — TROPONIN I
TROPONIN I: 0.04 ng/mL — AB (ref <0.03)
Troponin I: 0.04 ng/mL (ref <0.03)

## 2017-06-05 LAB — TSH: TSH: 4.141 u[IU]/mL (ref 0.350–4.500)

## 2017-06-05 LAB — BRAIN NATRIURETIC PEPTIDE: B NATRIURETIC PEPTIDE 5: 1485.2 pg/mL — AB (ref 0.0–100.0)

## 2017-06-05 LAB — MAGNESIUM: MAGNESIUM: 2.1 mg/dL (ref 1.7–2.4)

## 2017-06-05 MED ORDER — LIDOCAINE HCL (PF) 1 % IJ SOLN: 2.0000 mL | Freq: Once | INTRAMUSCULAR | Status: DC

## 2017-06-05 MED ORDER — CARVEDILOL 3.125 MG PO TABS: 3.1250 mg | ORAL_TABLET | Freq: Every day | ORAL | Status: DC

## 2017-06-05 MED ORDER — AMLODIPINE BESYLATE 5 MG PO TABS
5.0000 mg | ORAL_TABLET | Freq: Two times a day (BID) | ORAL | Status: DC
  Administered 2017-06-05 – 2017-06-11 (×12): 5 mg via ORAL
  Filled 2017-06-05 (×12): qty 1

## 2017-06-05 MED ORDER — FLUTICASONE PROPIONATE 50 MCG/ACT NA SUSP
1.0000 | Freq: Every day | NASAL | Status: DC
  Administered 2017-06-06 – 2017-06-11 (×6): 1 via NASAL
  Filled 2017-06-05 (×2): qty 16

## 2017-06-05 MED ORDER — ASPIRIN EC 81 MG PO TBEC
81.0000 mg | DELAYED_RELEASE_TABLET | Freq: Every day | ORAL | Status: DC
  Administered 2017-06-06 – 2017-06-11 (×6): 81 mg via ORAL
  Filled 2017-06-05 (×6): qty 1

## 2017-06-05 MED ORDER — HYDRALAZINE HCL 50 MG PO TABS
50.0000 mg | ORAL_TABLET | Freq: Three times a day (TID) | ORAL | Status: DC
  Administered 2017-06-05 – 2017-06-11 (×17): 50 mg via ORAL
  Filled 2017-06-05 (×17): qty 1

## 2017-06-05 MED ORDER — SODIUM CHLORIDE 0.9% FLUSH: 3.0000 mL | INTRAVENOUS | Status: DC | PRN

## 2017-06-05 MED ORDER — PHENYLEPHRINE 200 MCG/ML FOR PRIAPISM / HYPOTENSION
200.0000 ug | INTRAMUSCULAR | Status: DC | PRN
  Filled 2017-06-05: qty 50

## 2017-06-05 MED ORDER — ATORVASTATIN CALCIUM 20 MG PO TABS
20.0000 mg | ORAL_TABLET | Freq: Every day | ORAL | Status: DC
  Administered 2017-06-05 – 2017-06-10 (×6): 20 mg via ORAL
  Filled 2017-06-05 (×6): qty 1

## 2017-06-05 MED ORDER — HEPARIN SODIUM (PORCINE) 5000 UNIT/ML IJ SOLN
5000.0000 [IU] | Freq: Three times a day (TID) | INTRAMUSCULAR | Status: DC
  Administered 2017-06-05 – 2017-06-11 (×16): 5000 [IU] via SUBCUTANEOUS
  Filled 2017-06-05 (×16): qty 1

## 2017-06-05 MED ORDER — FUROSEMIDE 10 MG/ML IJ SOLN
40.0000 mg | Freq: Once | INTRAMUSCULAR | Status: AC
  Administered 2017-06-05: 40 mg via INTRAVENOUS
  Filled 2017-06-05: qty 4

## 2017-06-05 MED ORDER — SODIUM CHLORIDE 0.9% FLUSH
3.0000 mL | Freq: Two times a day (BID) | INTRAVENOUS | Status: DC
  Administered 2017-06-05 – 2017-06-10 (×11): 3 mL via INTRAVENOUS

## 2017-06-05 MED ORDER — CARVEDILOL 3.125 MG PO TABS
3.1250 mg | ORAL_TABLET | Freq: Every day | ORAL | Status: DC
  Administered 2017-06-05: 3.125 mg via ORAL
  Filled 2017-06-05: qty 1

## 2017-06-05 MED ORDER — CLOPIDOGREL BISULFATE 75 MG PO TABS
75.0000 mg | ORAL_TABLET | Freq: Every day | ORAL | Status: DC
  Administered 2017-06-06 – 2017-06-11 (×6): 75 mg via ORAL
  Filled 2017-06-05 (×6): qty 1

## 2017-06-05 MED ORDER — FUROSEMIDE 10 MG/ML IJ SOLN
20.0000 mg | Freq: Every day | INTRAMUSCULAR | Status: DC
Start: 2017-06-06 — End: 2017-06-06

## 2017-06-05 MED ORDER — ONDANSETRON HCL 4 MG/2ML IJ SOLN: 4.0000 mg | Freq: Four times a day (QID) | INTRAMUSCULAR | Status: DC | PRN

## 2017-06-05 MED ORDER — SODIUM CHLORIDE 0.9 % IV SOLN: 250.0000 mL | INTRAVENOUS | Status: DC | PRN

## 2017-06-05 MED ORDER — PRESERVISION AREDS PO CAPS: 1.0000 | ORAL_CAPSULE | Freq: Every day | ORAL | Status: DC

## 2017-06-05 MED ORDER — HYDRALAZINE HCL 50 MG PO TABS
50.0000 mg | ORAL_TABLET | Freq: Three times a day (TID) | ORAL | Status: DC
Start: 2017-06-05 — End: 2017-06-05

## 2017-06-05 MED ORDER — HYDRALAZINE HCL 20 MG/ML IJ SOLN: 10.0000 mg | INTRAMUSCULAR | Status: DC | PRN

## 2017-06-05 MED ORDER — ACETAMINOPHEN 325 MG PO TABS
650.0000 mg | ORAL_TABLET | ORAL | Status: DC | PRN
  Administered 2017-06-06 – 2017-06-09 (×4): 650 mg via ORAL
  Filled 2017-06-05 (×5): qty 2

## 2017-06-05 NOTE — H&P (Addendum)
[facility] History and Physical   Patient: [name] UJW:[id number]   PCP: Daisy Floro, MD DOB: June 20, 1931   DOA: 06/05/2017   DOS: 06/05/2017   DOS: the patient was seen and examined on 06/05/2017  Patient coming from: The patient is coming from home.  Chief Complaint: shortnes of breath  HPI: [name] is a 83 y.o. female with Past medical history of Chronic diastolic CHF, left pleural effusion, CAD, carotid artery stenosis, anemia, CKD stage III-IV. Patient presented with complaints of progressively worsening shortness of breath over last 1 month. Patient basically was on daily Lasix, which is gradually been reduced to Lasix every 5 days. Over last one month the patient has progressively complained about orthopnea as well as PND. For last 1 week this is worsened further. Patient has on and off swelling on the left side but per family patient prefers to sleep on her left side. Patient was actually seen by her cardiologist and had a thoracentesis done which showed transudative fluid. Denies having any complaints of chest pain the time of my evaluation but reports that she has on and off chest tightness which was like her bra tightening. Denies any nausea or vomiting here. No abdominal pain. No diarrhea no constipation. No active bleeding. No burning urination. Family was just recently told about checking her weight on a daily basis. At one time the patient was down to 114 pounds after aggressive diuresis and her baseline weight ranges between 125 to 130 pound per family. Denies any leg pain. Family reported that there was some redness of the left arm which has currently resolved.  ED Course: Seen in [facility] [place], given IV Lasix and was brought here due to hypoxia.  At her baseline ambulates with support And is independent for most of her ADL; manages her medication on her own.  Review of Systems: as mentioned in the history of present illness.   All other systems reviewed and are negative.  Past Medical History:  Diagnosis Date  . Anemia    a. "On/off my whole life" - etiology unclear. Has never required blood transfusion.  . Anemia associated with stage 3 chronic renal failure (HCC) 06/02/2016  . Arthritis   . CAD (coronary artery disease)   . Carotid artery occlusion    RICA 80-99%, LICA 40-59% by dopplers 06/2013. Also with cerebrovascular disease otherwise on MRA 06/2013.  . Carotid artery stenosis   . Chronic kidney disease   . Chronic renal insufficiency, stage 3 (moderate) (HCC) 06/02/2016  . Depression   . Dizziness    a. Adm 06/2013: felt due to posterior circulation deficits from vertebrobasilar insufficiency and possibly vertigo.  Marland Kitchen DVT (deep venous thrombosis) (HCC)    a. Around age 42, details unclear (SVT vs DVT?), s/p vein stripping without recurrence.  . Erythropoietin deficiency anemia 06/02/2016  . Heart murmur    a. Longstanding. mild MR on prior ECG, also may be in setting of known vasc dz.  . Hiatal hernia   . Hyperlipidemia   . Hypertension    a. Intolerant to Maxzide due to hyponatremia. b. intolerant to Clonidine due to slow HR/fatigue. c. Discrepancy in BP felt due to innominant stenosis.  . Iron deficiency anemia due to chronic blood loss 09/08/2016  . Iron malabsorption 09/08/2016  . Macular degeneration    In both eyes - no longer drives.  . Macular degeneration    L- macular degeneration, R eye impaired as well  .  Multiple thyroid nodules    a. By [place] 06/2013. Bx recommended.  . Pernicious anemia 06/02/2016  . PFO (patent foramen ovale)    a. Per echo in 12/2010.  Marland Kitchen PVD (peripheral vascular disease) (HCC)    a. R innominant stenosis. b. 06/2013 ABI: 0.80 R, 0.50 L., DVT- 1970's  . Varicose veins    Past Surgical History:  Procedure Laterality Date  . ABDOMINAL HYSTERECTOMY  1971  . APPENDECTOMY    . BLADDER SURGERY  2004   baldder sling  . CATARACT EXTRACTION    . CESAREAN SECTION       X1  . ENDARTERECTOMY Right 10/31/2015   Procedure: RIGHT CAROTID ENDARTERECTOMY;  Surgeon: Nada Libman, MD;  Location: [facility] OR;  Service: Vascular;  Laterality: Right;  . EYE SURGERY     cataracts removed, /w IOL  . INCONTINENCE SURGERY    . IR THORACENTESIS ASP PLEURAL SPACE W/IMG GUIDE  11/23/2016  . LUMBAR DISC SURGERY    . Neck Muscle Surgery    . SPINE SURGERY    . TOE SURGERY Right    great toe with pins  . VARICOSE VEIN SURGERY     left leg   Social History:  reports that she quit smoking about 7 years ago. She has never used smokeless tobacco. She reports that she does not drink alcohol or use drugs.  Allergies  Allergen Reactions  . Penicillins Swelling and Rash    Mouth became swollen Has patient had a PCN reaction causing immediate rash, facial/tongue/throat swelling, SOB or lightheadedness with hypotension: Yes Has patient had a PCN reaction causing severe rash involving mucus membranes or skin necrosis: No Has patient had a PCN reaction that required hospitalization: No Has patient had a PCN reaction occurring within the last 10 years: No If all of the above answers are "NO", then may proceed with Cephalosporin use.   . Clonidine Derivatives Other (See Comments)    Fatigue and Bradycardia   . Maxzide [Hydrochlorothiazide W-Triamterene] Other (See Comments)    Resulted in Hyponatremia     Family History  Problem Relation Age of Onset  . Cancer Mother        [name]  . Diabetes Sister   . Cancer Sister   . Hyperlipidemia Sister   . Heart attack Sister   . Hypertension Sister   . Cancer Sister        Breast  . Hyperlipidemia Sister   . Diabetes Son   . Hyperlipidemia Daughter   . Hypertension Daughter      Prior to Admission medications   Medication Sig Start Date End Date Taking? Authorizing Provider  amLODipine (NORVASC) 5 MG tablet TAKE ONE TABLET BY MOUTH TWICE DAILY Patient taking differently: Take 5 mg by mouth two times a day 01/25/17  Yes Pricilla Riffle, MD  aspirin 81 MG tablet Take 81 mg by mouth daily.     Yes [provider]  atorvastatin (LIPITOR) 20 MG tablet TAKE 1 TABLET (20 MG TOTAL) BY MOUTH DAILY. 01/25/17  Yes Pricilla Riffle, MD  CALCIUM PO Take 1 tablet by mouth daily.   Yes [provider]  carvedilol (COREG) 3.125 MG tablet TAKE 1 TABLET (3.125 MG TOTAL) BY MOUTH DAILY. Patient taking differently: Take 3.125 mg by mouth at bedtime 11/05/16  Yes Pricilla Riffle, MD  Cholecalciferol (VITAMIN D-3) 1000 units CAPS Take 1,000 Units by mouth at bedtime.   Yes [provider]  clopidogrel (PLAVIX) 75 MG tablet  TAKE ONE TABLET BY MOUTH DAILY WITH BREAKFAST Patient taking differently: Take 75 mg by mouth once a day 02/23/17  Yes Pricilla Riffle, MD  Darbepoetin Alfa (ARANESP) 300 MCG/0.6ML SOSY injection Inject 300 mcg into the skin See admin instructions. EVERY 3-4 WEEKS   Yes [provider]  fluticasone (FLONASE) 50 MCG/ACT nasal spray Place 1 spray into both nostrils daily. 07/27/16  Yes [provider]  furosemide (LASIX) 20 MG tablet Take 2 tablets (40 mg total) by mouth daily. Take 2 tablets (40 mg) every other day Patient taking differently: Take 40 mg by mouth See admin instructions. EVERY 5 DAYS 02/18/17  Yes Pricilla Riffle, MD  hydrALAZINE (APRESOLINE) 25 MG tablet Take 2 tablets (50 mg total) by mouth 3 (three) times daily. 12/31/16  Yes Pricilla Riffle, MD  Multiple Vitamins-Minerals (PRESERVISION AREDS) CAPS Take 1 capsule by mouth daily.    Yes [provider]    Physical Exam: Vitals:   06/05/17 1400 06/05/17 1630 06/05/17 1715 06/05/17 1802  BP: (!) 149/69 (!) 163/61 (!) 153/62 (!) 170/70  Pulse: 70 76  77  Resp: 20 (!) 27  20  Temp:   97.7 F (36.5 C) 98 F (36.7 C)  TempSrc:   Oral Oral  SpO2: 94% 93%  96%  Weight:    57.5 kg (126 lb 12.2 oz)  Height:     (1.702 m)    General: Alert, Awake and Oriented to Time, Place and Person. Appear in moderate distress,  affect appropriate Eyes: PERRL, Conjunctiva normal ENT: Oral Mucosa clear moist. Neck: positive JVD, no Abnormal Mass Or lumps Cardiovascular: S1 and S2 Present, aortic systolic Murmur, Peripheral Pulses Present Respiratory: increased respiratory effort, Bilateral Air entry equal and Decreased, no use of accessory muscle, basal Crackles, no wheezes Abdomen: Bowel Sound present, Soft and no tenderness, no hernia Skin: no redness, no Rash, no induration Extremities: trace Pedal edema, no calf tenderness Neurologic: Grossly no focal neuro deficit. Bilaterally Equal motor strength  Labs on Admission:  CBC:  Recent Labs Lab 06/05/17 1049  WBC 7.8  HGB 12.2  HCT 37.8  MCV 86.5  PLT 256   Basic Metabolic Panel:  Recent Labs Lab 06/05/17 1049  NA 138  K 4.2  CL 107  CO2 22  GLUCOSE 127*  BUN 30*  CREATININE 2.22*  CALCIUM 8.6*   GFR: Estimated Creatinine Clearance: 17.1 mL/min (A) (by C-G formula based on SCr of 2.22 mg/dL (H)). Liver Function Tests: No results for input(s): AST, ALT, ALKPHOS, BILITOT, PROT, ALBUMIN in the last 168 hours. No results for input(s): LIPASE, AMYLASE in the last 168 hours. No results for input(s): AMMONIA in the last 168 hours. Coagulation Profile: No results for input(s): INR, PROTIME in the last 168 hours. Cardiac Enzymes:  Recent Labs Lab 06/05/17 1049  TROPONINI 0.04*   BNP (last 3 results)  Recent Labs  12/31/16 1025 01/22/17 1346 02/01/17 1453  PROBNP 11,403* 11,385* 11,969*   HbA1C: No results for input(s): HGBA1C in the last 72 hours. CBG: No results for input(s): GLUCAP in the last 168 hours. Lipid Profile: No results for input(s): CHOL, HDL, LDLCALC, TRIG, CHOLHDL, LDLDIRECT in the last 72 hours. Thyroid Function Tests: No results for input(s): TSH, T4TOTAL, FREET4, T3FREE, THYROIDAB in the last 72 hours. Anemia Panel: No results for input(s): VITAMINB12, FOLATE, FERRITIN, TIBC, IRON, RETICCTPCT in the last 72  hours. Urine analysis:    Component Value Date/Time   COLORURINE YELLOW 04/19/2017 1415  APPEARANCEUR CLEAR 04/19/2017 1415   LABSPEC 1.017 04/19/2017 1415   PHURINE 6.0 04/19/2017 1415   GLUCOSEU NEGATIVE 04/19/2017 1415   HGBUR NEGATIVE 04/19/2017 1415   BILIRUBINUR NEGATIVE 04/19/2017 1415   KETONESUR NEGATIVE 04/19/2017 1415   PROTEINUR >300 (A) 04/19/2017 1415   UROBILINOGEN 0.2 06/29/2013 0512   NITRITE NEGATIVE 04/19/2017 1415   LEUKOCYTESUR NEGATIVE 04/19/2017 1415    Radiological Exams on Admission: Dg Chest 2 View  Result Date: 06/05/2017 CLINICAL DATA:  Shortness of breath. EXAM: CHEST  2 VIEW COMPARISON:  04/19/2017 FINDINGS: The cardiac silhouette is stably enlarged. Mediastinal contours appear intact. Calcific atherosclerotic disease of the aorta. There has been interval increase in the size of left pleural effusion, which is now moderate. There has also been interval increase in size of the small right pleural effusion. Previously noted bilateral lower lobe airspace consolidation is mostly obscured by the pleural effusions. No evidence of pneumothorax. Osseous structures are without acute abnormality. Soft tissues are grossly normal. IMPRESSION: Enlargement of the bilateral pleural effusions, moderate in size on the left and small on the right. Enlarged cardiac silhouette. Calcific atherosclerotic disease of the aorta. Electronically Signed   By: Ted Mcalpine M.D.   On: 06/05/2017 11:09   EKG: Independently reviewed. normal sinus rhythm, nonspecific ST and T waves changes.  Assessment/Plan 1. Acute on chronic diastolic CHF (congestive heart failure) (HCC)  Acute hypoxic respiratory failure requiring 3 L of oxygen right now. Chronic diastolic CHF management with oral Lasix at home. Worsening acutely due to poor home regimen. Will continue with IV Lasix for now. Patient received 40 mg IV in the ER today. Will give 20 mg IV starting tomorrow. Monitor daily weight  and in and out. Also provide incentive spirometry.  2. Left pleural effusion. Patient has chronic left pleural effusion appears to be reaccumulated a day on his chest x-ray. Recently had thoracentesis which was suggestive of transudative fluid. Most likely from CHF. At present would prefer using diuretic as well as incentive spirometry for fluid removal.  3. Chronic kidney disease. Known since long, appears to be at baseline. Patient is agreeing to receive IV Lasix, monitor daily BMP.  4. Coronary artery disease. Carotid artery stenosis. Continuing aspirin and Plavix.  5. Essential hypertension. Continuing home regimen. Next and mildly elevated right now with acceleration. We'll use when necessary hydralazine.  6. Chronic pernicious anemia, B-12 deficiency as well as iron of his MG anemia. Patient receives EPO, IV iron infusion as well as B-12 injections with hematology. Continue to monitor for now. No active bleeding.  7. Left upper extremity swelling. More likely this is due to gravitational pull because of the patient's preference to lie on the left side. Continue to monitor for now.   Nutrition: cardiac diet DVT Prophylaxis: subcutaneous Heparin  Advance goals of care discussion: I discussed with patient, patient wants to remain full code. Patient's 2 daughter will remain her power of attorney.   Consults: none  Family Communication: family was present at bedside, at the time of interview.  Opportunity was given to ask question and all questions were answered satisfactorily.  Disposition: Admitted as inpatient tele unit. Likely to be discharged home, in 2-3 days.  Author: Lynden Oxford, MD [facility] Pager: [phone] 06/05/2017  If 7PM-7AM, please contact night-coverage [email or web address] Password [email or web address]

## 2017-06-05 NOTE — Progress Notes (Signed)
HFpEF- last known EF over 60%, patient presenting from  [facility] in [facility] with concerns about acute on chronic diastolic CHF exacerbation requiring IV diuresis in the setting of CKD with baseline creatinine around 2.  Patient has worsening bilateral pleural effusions may need left-sided thoracentensis if symptoms does not improve with IV diuresis  Jessica Wenk, MD

## 2017-06-05 NOTE — ED Notes (Signed)
Call to [phone] 

## 2017-06-05 NOTE — ED Notes (Signed)
Applied O2 2L [place] at this time due to spo2 90-92% on RA with continued dyspnea

## 2017-06-05 NOTE — ED Provider Notes (Addendum)
[facility] Provider Note   CSN: [id number] Arrival date & time: 06/05/17  4098     History   Chief Complaint Chief Complaint  Patient presents with  . Shortness of Breath    HPI [name] is a 83 y.o. female.  HPI Complains of shortness of breath for approximately the past 1 month. Shortness of breath worse with minimal activity for the past 2-3 days. She is treated with Lasix every 5 days last dose yesterday. Shortness breath is worse with lying supine improved with sitting upright. She slept on 2 pillows last night. No other associated symptoms. Her daughter notes that she's had swelling in her left flank and left arm today. Swelling is where she lies dependent. Denies chest pain denies, abdominal pain, flank pain cough denies fever no other associated symptoms Past Medical History:  Diagnosis Date  . Anemia    a. "On/off my whole life" - etiology unclear. Has never required blood transfusion.  . Anemia associated with stage 3 chronic renal failure (HCC) 06/02/2016  . Arthritis   . CAD (coronary artery disease)   . Carotid artery occlusion    RICA 80-99%, LICA 40-59% by dopplers 06/2013. Also with cerebrovascular disease otherwise on MRA 06/2013.  . Carotid artery stenosis   . Chronic kidney disease   . Chronic renal insufficiency, stage 3 (moderate) (HCC) 06/02/2016  . Depression   . Dizziness    a. Adm 06/2013: felt due to posterior circulation deficits from vertebrobasilar insufficiency and possibly vertigo.  Marland Kitchen DVT (deep venous thrombosis) (HCC)    a. Around age 55, details unclear (SVT vs DVT?), s/p vein stripping without recurrence.  . Erythropoietin deficiency anemia 06/02/2016  . Heart murmur    a. Longstanding. mild MR on prior ECG, also may be in setting of known vasc dz.  . Hiatal hernia   . Hyperlipidemia   . Hypertension    a. Intolerant to Maxzide due to hyponatremia. b. intolerant to Clonidine due to slow HR/fatigue. c. Discrepancy in BP  felt due to innominant stenosis.  . Iron deficiency anemia due to chronic blood loss 09/08/2016  . Iron malabsorption 09/08/2016  . Macular degeneration    In both eyes - no longer drives.  . Macular degeneration    L- macular degeneration, R eye impaired as well  . Multiple thyroid nodules    a. By [place] 06/2013. Bx recommended.  . Pernicious anemia 06/02/2016  . PFO (patent foramen ovale)    a. Per echo in 12/2010.  Marland Kitchen PVD (peripheral vascular disease) (HCC)    a. R innominant stenosis. b. 06/2013 ABI: 0.80 R, 0.50 L., DVT- 1970's  . Varicose veins     Patient Active Problem List   Diagnosis Date Noted  . Elevated serum creatinine 02/26/2017  . Elevated BUN 02/26/2017  . Iron deficiency anemia due to chronic blood loss 09/08/2016  . Iron malabsorption 09/08/2016  . Anemia in chronic kidney disease 08/18/2016  . Erythropoietin deficiency anemia 06/02/2016  . Pernicious anemia 06/02/2016  . Chronic renal insufficiency, stage 3 (moderate) (HCC) 06/02/2016  . Carotid artery stenosis 10/31/2015  . Carotid stenosis 06/11/2014  . PVD (peripheral vascular disease) with claudication (HCC) 06/11/2014  . Varicose veins of lower extremities with other complications 02/16/2014  . Neck pain-Posterior 11/30/2013  . Osteopenia, senile 11/22/2013  . Heart palpitations 07/14/2013  . Burning sensation of left arm 07/14/2013  . Arm pain 07/14/2013  . Dizziness 09/12/2012  . Murmur 12/16/2010  . Hyperlipidemia LDL goal <  100 07/17/2009  . Essential hypertension 07/17/2009  . CAROTID STENOSIS 06/21/2009    Past Surgical History:  Procedure Laterality Date  . ABDOMINAL HYSTERECTOMY  1971  . APPENDECTOMY    . BLADDER SURGERY  2004   baldder sling  . CATARACT EXTRACTION    . CESAREAN SECTION     X1  . ENDARTERECTOMY Right 10/31/2015   Procedure: RIGHT CAROTID ENDARTERECTOMY;  Surgeon: Nada Libman, MD;  Location[facility];  Service: Vascular;  Laterality: Right;  . EYE SURGERY     cataracts  removed, /w IOL  . INCONTINENCE SURGERY    . IR THORACENTESIS ASP PLEURAL SPACE W/IMG GUIDE  11/23/2016  . LUMBAR DISC SURGERY    . Neck Muscle Surgery    . SPINE SURGERY    . TOE SURGERY Right    great toe with pins  . VARICOSE VEIN SURGERY     left leg    OB History    No data available       Home Medications    Prior to Admission medications   Medication Sig Start Date End Date Taking? Authorizing Provider  amLODipine (NORVASC) 5 MG tablet TAKE ONE TABLET BY MOUTH TWICE DAILY 01/25/17   Pricilla Riffle, MD  aspirin 81 MG tablet Take 81 mg by mouth daily.      [provider]  atorvastatin (LIPITOR) 20 MG tablet TAKE 1 TABLET (20 MG TOTAL) BY MOUTH DAILY. 01/25/17   Pricilla Riffle, MD  CALCIUM PO Take 1 tablet by mouth daily.    [provider]  carvedilol (COREG) 3.125 MG tablet TAKE 1 TABLET (3.125 MG TOTAL) BY MOUTH DAILY. 11/05/16   Pricilla Riffle, MD  cholecalciferol (VITAMIN D) 1000 UNITS tablet Take 1,000 Units by mouth daily.    [provider]  clopidogrel (PLAVIX) 75 MG tablet TAKE ONE TABLET BY MOUTH DAILY WITH BREAKFAST 02/23/17   Pricilla Riffle, MD  fluticasone [facility]) 50 MCG/ACT nasal spray Place 1 spray into both nostrils daily. 07/27/16   [provider]  furosemide (LASIX) 20 MG tablet Take 2 tablets (40 mg total) by mouth daily. Take 2 tablets (40 mg) every other day 02/18/17   Pricilla Riffle, MD  hydrALAZINE (APRESOLINE) 25 MG tablet Take 2 tablets (50 mg total) by mouth 3 (three) times daily. 12/31/16   Pricilla Riffle, MD  Multiple Vitamins-Minerals (PRESERVISION AREDS) CAPS Take 1 capsule by mouth daily.     [provider]    Family History Family History  Problem Relation Age of Onset  . Cancer Mother        [name]  . Diabetes Sister   . Cancer Sister   . Hyperlipidemia Sister   . Heart attack Sister   . Hypertension Sister   . Cancer Sister        Breast  . Hyperlipidemia Sister   . Diabetes Son   . Hyperlipidemia  Daughter   . Hypertension Daughter     Social History Social History  Substance Use Topics  . Smoking status: Former Smoker    Quit date: 08/24/2009  . Smokeless tobacco: Never Used  . Alcohol use No     Allergies   Penicillins; Clonidine derivatives; and Maxzide [hydrochlorothiazide w-triamterene]   Review of Systems Review of Systems  Respiratory: Positive for shortness of breath.   Cardiovascular:       Swelling of left arm and left flank  All other systems reviewed and are negative.  Physical Exam Updated Vital Signs BP (!) 161/68 (BP Location: Right Arm)   Pulse 80   Temp 97.7 F (36.5 C) (Oral)   Resp (!) 22   SpO2 94%   Physical Exam  Constitutional:  Chronically ill-appearing no respiratory distress  HENT:  Head: Normocephalic and atraumatic.  Eyes: Pupils are equal, round, and reactive to light. Conjunctivae are normal.  Neck: Neck supple. No tracheal deviation present. No thyromegaly present.  Cardiovascular: Normal rate and regular rhythm.   No murmur heard. Pulmonary/Chest: Effort normal.  Diminished breath sound at left base  Abdominal: Soft. Bowel sounds are normal. She exhibits no distension. There is no tenderness.  Musculoskeletal: Normal range of motion. She exhibits edema. She exhibits no tenderness.  1+ pitting edema left arm other extremities without edema  Neurological: She is alert. Coordination normal.  Skin: Skin is warm and dry. No rash noted.  Psychiatric: She has a normal mood and affect.  Nursing note and vitals reviewed.    ED Treatments / Results  Labs (all labs ordered are listed, but only abnormal results are displayed) Labs Reviewed  BASIC METABOLIC PANEL  CBC  TROPONIN I    EKG  EKG Interpretation  Date/Time:  Saturday June 05 2017 10:16:44 EDT Ventricular Rate:  78 PR Interval:    QRS Duration: 102 QT Interval:  387 QTC Calculation: 441 R Axis:   37 Text Interpretation:  Sinus rhythm Probable left  atrial enlargement Repol abnrm suggests ischemia, anterolateral No significant change since last tracing Confirmed by Doug Sou [phone]) on 06/05/2017 11:21:26 AM      Results for orders placed or performed during the hospital encounter of 06/05/17  Basic metabolic panel  Result Value Ref Range   Sodium 138 135 - 145 mmol/L   Potassium 4.2 3.5 - 5.1 mmol/L   Chloride 107 101 - 111 mmol/L   CO2 22 22 - 32 mmol/L   Glucose, Bld 127 (H) 65 - 99 mg/dL   BUN 30 (H) 6 - 20 mg/dL   Creatinine, Ser 4.78 (H) 0.44 - 1.00 mg/dL   Calcium 8.6 (L) 8.9 - 10.3 mg/dL   GFR calc non Af Amer 19 (L) >60 mL/min   GFR calc Af Amer 22 (L) >60 mL/min   Anion gap 9 5 - 15  CBC  Result Value Ref Range   WBC 7.8 4.0 - 10.5 K/uL   RBC 4.37 3.87 - 5.11 MIL/uL   Hemoglobin 12.2 12.0 - 15.0 g/dL   HCT 29.5 62.1 - 30.8 %   MCV 86.5 78.0 - 100.0 fL   MCH 27.9 26.0 - 34.0 pg   MCHC 32.3 30.0 - 36.0 g/dL   RDW 65.7 (H) 84.6 - 96.2 %   Platelets 256 150 - 400 K/uL  Troponin I  Result Value Ref Range   Troponin I 0.04 (HH) <0.03 ng/mL   Dg Chest 2 View  Result Date: 06/05/2017 CLINICAL DATA:  Shortness of breath. EXAM: CHEST  2 VIEW COMPARISON:  04/19/2017 FINDINGS: The cardiac silhouette is stably enlarged. Mediastinal contours appear intact. Calcific atherosclerotic disease of the aorta. There has been interval increase in the size of left pleural effusion, which is now moderate. There has also been interval increase in size of the small right pleural effusion. Previously noted bilateral lower lobe airspace consolidation is mostly obscured by the pleural effusions. No evidence of pneumothorax. Osseous structures are without acute abnormality. Soft tissues are grossly normal. IMPRESSION: Enlargement of the bilateral pleural effusions, moderate in size  on the left and small on the right. Enlarged cardiac silhouette. Calcific atherosclerotic disease of the aorta. Electronically Signed   By: Ted Mcalpine  M.D.   On: 06/05/2017 11:09    Radiology No results found.  Procedures Procedures (including critical care time)  Medications Ordered in ED Medications - No data to display  Chest x-ray viewed by me Results for orders placed or performed during the hospital encounter of 06/05/17  Basic metabolic panel  Result Value Ref Range   Sodium 138 135 - 145 mmol/L   Potassium 4.2 3.5 - 5.1 mmol/L   Chloride 107 101 - 111 mmol/L   CO2 22 22 - 32 mmol/L   Glucose, Bld 127 (H) 65 - 99 mg/dL   BUN 30 (H) 6 - 20 mg/dL   Creatinine, Ser 4.09 (H) 0.44 - 1.00 mg/dL   Calcium 8.6 (L) 8.9 - 10.3 mg/dL   GFR calc non Af Amer 19 (L) >60 mL/min   GFR calc Af Amer 22 (L) >60 mL/min   Anion gap 9 5 - 15  CBC  Result Value Ref Range   WBC 7.8 4.0 - 10.5 K/uL   RBC 4.37 3.87 - 5.11 MIL/uL   Hemoglobin 12.2 12.0 - 15.0 g/dL   HCT 81.1 91.4 - 78.2 %   MCV 86.5 78.0 - 100.0 fL   MCH 27.9 26.0 - 34.0 pg   MCHC 32.3 30.0 - 36.0 g/dL   RDW 95.6 (H) 21.3 - 08.6 %   Platelets 256 150 - 400 K/uL  Troponin I  Result Value Ref Range   Troponin I 0.04 (HH) <0.03 ng/mL   Dg Chest 2 View  Result Date: 06/05/2017 CLINICAL DATA:  Shortness of breath. EXAM: CHEST  2 VIEW COMPARISON:  04/19/2017 FINDINGS: The cardiac silhouette is stably enlarged. Mediastinal contours appear intact. Calcific atherosclerotic disease of the aorta. There has been interval increase in the size of left pleural effusion, which is now moderate. There has also been interval increase in size of the small right pleural effusion. Previously noted bilateral lower lobe airspace consolidation is mostly obscured by the pleural effusions. No evidence of pneumothorax. Osseous structures are without acute abnormality. Soft tissues are grossly normal. IMPRESSION: Enlargement of the bilateral pleural effusions, moderate in size on the left and small on the right. Enlarged cardiac silhouette. Calcific atherosclerotic disease of the aorta. Electronically  Signed   By: Ted Mcalpine M.D.   On: 06/05/2017 11:09   Initial Impression / Assessment and Plan / ED Course  I have reviewed the triage vital signs and the nursing notes.  Pertinent labs & imaging results that were available during my care of the patient were reviewed by me and considered in my medical decision making (see chart for details).     Patient treated with Lasix 40 mg IV and diuresed 375 mL of urine. At 3:45 PM patient's breathing was better upon ambulating she became dyspneic although she felt better than on arrival. Pulse oximetry dropped to 90% from 94% while at rest Hospitalization was offered the patient for further treatment which she agreed to. Renal insufficiency is chronic. Mildly elevated troponin chronic Dr.Emokpae from hospital service consulted and accepts patient in transfer to [facility] Final Clinical Impressions(s) / ED Diagnoses  Diagnosis #1Dyspnea on exertion #2 bilateral pleural effusions #3 chronic renal insufficiency Final diagnoses:  None    New Prescriptions New Prescriptions   No medications on file     Doug Sou, MD 06/05/17 1609  Doug Sou, MD 06/05/17 1610    Doug Sou, MD 06/05/17 [phone]

## 2017-06-05 NOTE — ED Notes (Signed)
Patient transported to X-ray 

## 2017-06-05 NOTE — ED Triage Notes (Signed)
Pt is here with increasing sob since beginning of October.  Pt sob increases with exertion and any activity now.  Pt can't lay down anymore due to the sob.  Pt takes lasix q5days.  Pt has swelling in her abdomen per her daughter but no lower extremity edema.  No CP. Pt is able to speak but it is making her more sob.

## 2017-06-05 NOTE — ED Notes (Signed)
Troponin 0.04, results given to ED MD 

## 2017-06-05 NOTE — Progress Notes (Signed)
Called MD to inform of pt arrival to unit

## 2017-06-05 NOTE — ED Notes (Addendum)
Ambulated in ed with about 30 Steps. SpO2 94-91%. Some increase WOB noted with some retractions noted. MD at bedside to observe.

## 2017-06-05 NOTE — Progress Notes (Signed)
MD at bedside. 

## 2017-06-05 NOTE — ED Notes (Signed)
Pt helped up to the bedside commode as she has not voided yet since receiving the lasix and feels the need to void but can't use the drywick in place

## 2017-06-05 NOTE — ED Notes (Signed)
ED Provider at bedside. 

## 2017-06-06 ENCOUNTER — Inpatient Hospital Stay (HOSPITAL_COMMUNITY): Payer: Medicare Other

## 2017-06-06 DIAGNOSIS — E785 Hyperlipidemia, unspecified: Secondary | ICD-10-CM

## 2017-06-06 DIAGNOSIS — I5033 Acute on chronic diastolic (congestive) heart failure: Secondary | ICD-10-CM

## 2017-06-06 DIAGNOSIS — I340 Nonrheumatic mitral (valve) insufficiency: Secondary | ICD-10-CM

## 2017-06-06 DIAGNOSIS — N183 Chronic kidney disease, stage 3 (moderate): Secondary | ICD-10-CM

## 2017-06-06 DIAGNOSIS — I10 Essential (primary) hypertension: Secondary | ICD-10-CM

## 2017-06-06 DIAGNOSIS — D631 Anemia in chronic kidney disease: Secondary | ICD-10-CM

## 2017-06-06 LAB — ALBUMIN, PLEURAL OR PERITONEAL FLUID

## 2017-06-06 LAB — CBC WITH DIFFERENTIAL/PLATELET
BASOS PCT: 1 %
Basophils Absolute: 0.1 10*3/uL (ref 0.0–0.1)
EOS ABS: 0.1 10*3/uL (ref 0.0–0.7)
EOS PCT: 2 %
HCT: 32.2 % — ABNORMAL LOW (ref 36.0–46.0)
HEMOGLOBIN: 10.1 g/dL — AB (ref 12.0–15.0)
LYMPHS ABS: 0.8 10*3/uL (ref 0.7–4.0)
Lymphocytes Relative: 12 %
MCH: 27.3 pg (ref 26.0–34.0)
MCHC: 31.4 g/dL (ref 30.0–36.0)
MCV: 87 fL (ref 78.0–100.0)
MONO ABS: 0.6 10*3/uL (ref 0.1–1.0)
MONOS PCT: 10 %
NEUTROS PCT: 75 %
Neutro Abs: 4.6 10*3/uL (ref 1.7–7.7)
Platelets: 194 10*3/uL (ref 150–400)
RBC: 3.7 MIL/uL — ABNORMAL LOW (ref 3.87–5.11)
RDW: 15.9 % — AB (ref 11.5–15.5)
WBC: 6.2 10*3/uL (ref 4.0–10.5)

## 2017-06-06 LAB — BASIC METABOLIC PANEL
Anion gap: 9 (ref 5–15)
BUN: 27 mg/dL — AB (ref 6–20)
CO2: 21 mmol/L — ABNORMAL LOW (ref 22–32)
CREATININE: 2.17 mg/dL — AB (ref 0.44–1.00)
Calcium: 7.9 mg/dL — ABNORMAL LOW (ref 8.9–10.3)
Chloride: 109 mmol/L (ref 101–111)
GFR, EST AFRICAN AMERICAN: 23 mL/min — AB (ref >60)
GFR, EST NON AFRICAN AMERICAN: 20 mL/min — AB (ref >60)
Glucose, Bld: 80 mg/dL (ref 65–99)
POTASSIUM: 4.1 mmol/L (ref 3.5–5.1)
SODIUM: 139 mmol/L (ref 135–145)

## 2017-06-06 LAB — LACTATE DEHYDROGENASE, PLEURAL OR PERITONEAL FLUID: LD FL: 49 U/L — AB (ref 3–23)

## 2017-06-06 LAB — ECHOCARDIOGRAM COMPLETE
Height: 67 in
WEIGHTICAEL: 2017.6 [oz_av]

## 2017-06-06 LAB — BODY FLUID CELL COUNT WITH DIFFERENTIAL
EOS FL: 0 %
LYMPHS FL: 56 %
MONOCYTE-MACROPHAGE-SEROUS FLUID: 33 % — AB (ref 50–90)
Neutrophil Count, Fluid: 11 % (ref 0–25)
Total Nucleated Cell Count, Fluid: 155 cu mm (ref 0–1000)

## 2017-06-06 LAB — TROPONIN I
TROPONIN I: 0.04 ng/mL — AB (ref <0.03)
Troponin I: 0.04 ng/mL (ref <0.03)

## 2017-06-06 LAB — GRAM STAIN

## 2017-06-06 LAB — GLUCOSE, PLEURAL OR PERITONEAL FLUID: Glucose, Fluid: 120 mg/dL

## 2017-06-06 LAB — PROTEIN, PLEURAL OR PERITONEAL FLUID

## 2017-06-06 MED ORDER — CARVEDILOL 6.25 MG PO TABS
6.2500 mg | ORAL_TABLET | Freq: Once | ORAL | Status: DC
  Filled 2017-06-06: qty 1

## 2017-06-06 MED ORDER — FUROSEMIDE 10 MG/ML IJ SOLN
40.0000 mg | Freq: Every day | INTRAMUSCULAR | Status: DC
  Administered 2017-06-06: 40 mg via INTRAVENOUS
  Filled 2017-06-06: qty 4

## 2017-06-06 MED ORDER — LIDOCAINE HCL (PF) 1 % IJ SOLN
INTRAMUSCULAR | Status: AC
  Filled 2017-06-06: qty 30

## 2017-06-06 MED ORDER — CARVEDILOL 12.5 MG PO TABS
12.5000 mg | ORAL_TABLET | Freq: Every day | ORAL | Status: DC
  Administered 2017-06-06 – 2017-06-10 (×5): 12.5 mg via ORAL
  Filled 2017-06-06 (×5): qty 1

## 2017-06-06 NOTE — Procedures (Signed)
PROCEDURE SUMMARY:  Successful US guided diagnostic and therapeutic left thoracentesis. Yielded 1.2 liters of clear, yellow fluid. Pt tolerated procedure well. No immediate complications.  Specimen was sent for labs. CXR ordered.  Hoyt Koch PA-C 06/06/2017 2:09 PM

## 2017-06-06 NOTE — Progress Notes (Signed)
Physical Therapy  Note  SATURATION QUALIFICATIONS: (This note is used to comply with regulatory documentation for home oxygen)  Patient Saturations on Room Air at Rest = 90%  Patient Saturations on Room Air while Ambulating = 86%  Patient Saturations on 2.5 Liters of oxygen post Ambulating = 93%  Please briefly explain why patient needs home oxygen: Patient requires supplemental oxygen to maintain oxygen saturations at acceptable, safe levels with physical activity.   Please see also full PT Evaluation note of this date for other details of session;   Van Clines, PT  Acute Rehabilitation Services Pager [phone] Office [phone]

## 2017-06-06 NOTE — Consult Note (Addendum)
Cardiology Consult    Patient ID: NERINE PULSE; [id number]; 1931/02/13   Admit date: 06/05/2017 Date of Consult: 06/06/2017  Primary Care Provider: Daisy Floro, MD Primary Cardiologist: Dr. Tenny Craw  Patient Profile    [name] is a 83 y.o. female with past medical history of carotid artery disease (s/p R CEA in 10/2015), chronic diastolic CHF, Stage 3 CKD, recurrent pleural effusions (s/p prior thoracentesis), HTN, and HLD who is being seen today for the evaluation of CHF at the request of Dr. Hanley Ben.   History of Present Illness    Ms. [name] was last examined by Dr. Tenny Craw in 04/2017 and reported doing well at that time. Weight was stable at 127 lbs and she was continued on her current medication regimen.  She presented to [facility] ED on 06/05/2017 for worsening dyspnea at rest and on exertion. Denies any associated orthopnea, PND, lower extremity edema, abdominal distension, chest pain, or palpitations. Reports weights have been stable on her home scales (baseline 125 -126 lbs) and she reports compliance with her PTA diuretic regimen (Lasix 40mg  daily).    Initial labs showed WBC of 7.8, Hgb 12.2, platelets 256, Na+ 138, K+ 4.2, creatinine 2.22 (similar to prior values). BNP 1485. Initial troponin 0.04 with repeat values of 0.04 and 0.04. CXR showed enlargement of the bilateral pleural effusions with a moderate effusion on the left and small on the right. EKG shows NSR, HR 78, with mild LVH and repol abnormality (similar to prior tracings).   She has been started on Lasix 40mg  daily with only a net output of -765 mL thus far. However, she reports significant improvement in her respiratory status.    Past Medical History   Past Medical History:  Diagnosis Date  . Anemia    a. "On/off my whole life" - etiology unclear. Has never required blood transfusion.  . Anemia associated with stage 3 chronic renal failure (HCC) 06/02/2016  . Arthritis   . CAD (coronary artery  disease)   . Carotid artery occlusion    RICA 80-99%, LICA 40-59% by dopplers 06/2013. Also with cerebrovascular disease otherwise on MRA 06/2013.  . Carotid artery stenosis   . Chronic kidney disease   . Chronic renal insufficiency, stage 3 (moderate) (HCC) 06/02/2016  . Depression   . Dizziness    a. Adm 06/2013: felt due to posterior circulation deficits from vertebrobasilar insufficiency and possibly vertigo.  Marland Kitchen DVT (deep venous thrombosis) (HCC)    a. Around age 41, details unclear (SVT vs DVT?), s/p vein stripping without recurrence.  . Erythropoietin deficiency anemia 06/02/2016  . Heart murmur    a. Longstanding. mild MR on prior ECG, also may be in setting of known vasc dz.  . Hiatal hernia   . Hyperlipidemia   . Hypertension    a. Intolerant to Maxzide due to hyponatremia. b. intolerant to Clonidine due to slow HR/fatigue. c. Discrepancy in BP felt due to innominant stenosis.  . Iron deficiency anemia due to chronic blood loss 09/08/2016  . Iron malabsorption 09/08/2016  . Macular degeneration    In both eyes - no longer drives.  . Macular degeneration    L- macular degeneration, R eye impaired as well  . Multiple thyroid nodules    a. By [place] 06/2013. Bx recommended.  . Pernicious anemia 06/02/2016  . PFO (patent foramen ovale)    a. Per echo in 12/2010.  Marland Kitchen PVD (peripheral vascular disease) (HCC)    a. R innominant stenosis.  b. 06/2013 ABI: 0.80 R, 0.50 L., DVT- 1970's  . Varicose veins      Allergies:   Allergies  Allergen Reactions  . Penicillins Swelling and Rash    Mouth became swollen Has patient had a PCN reaction causing immediate rash, facial/tongue/throat swelling, SOB or lightheadedness with hypotension: Yes Has patient had a PCN reaction causing severe rash involving mucus membranes or skin necrosis: No Has patient had a PCN reaction that required hospitalization: No Has patient had a PCN reaction occurring within the last 10 years: No If all of the above  answers are "NO", then may proceed with Cephalosporin use.   . Clonidine Derivatives Other (See Comments)    Fatigue and Bradycardia   . Maxzide [Hydrochlorothiazide W-Triamterene] Other (See Comments)    Resulted in Hyponatremia    Home Medications:   Home Medications:  Prior to Admission medications   Medication Sig Start Date End Date Taking? Authorizing Provider  amLODipine (NORVASC) 5 MG tablet TAKE ONE TABLET BY MOUTH TWICE DAILY Patient taking differently: Take 5 mg by mouth two times a day 01/25/17  Yes Pricilla Riffle, MD  aspirin 81 MG tablet Take 81 mg by mouth daily.     Yes [provider]  atorvastatin (LIPITOR) 20 MG tablet TAKE 1 TABLET (20 MG TOTAL) BY MOUTH DAILY. 01/25/17  Yes Pricilla Riffle, MD  CALCIUM PO Take 1 tablet by mouth daily.   Yes [provider]  carvedilol (COREG) 3.125 MG tablet TAKE 1 TABLET (3.125 MG TOTAL) BY MOUTH DAILY. Patient taking differently: Take 3.125 mg by mouth at bedtime 11/05/16  Yes Pricilla Riffle, MD  Cholecalciferol (VITAMIN D-3) 1000 units CAPS Take 1,000 Units by mouth at bedtime.   Yes [provider]  clopidogrel (PLAVIX) 75 MG tablet TAKE ONE TABLET BY MOUTH DAILY WITH BREAKFAST Patient taking differently: Take 75 mg by mouth once a day 02/23/17  Yes Pricilla Riffle, MD  Darbepoetin Alfa (ARANESP) 300 MCG/0.6ML SOSY injection Inject 300 mcg into the skin See admin instructions. EVERY 3-4 WEEKS   Yes [provider]  fluticasone (FLONASE) 50 MCG/ACT nasal spray Place 1 spray into both nostrils daily. 07/27/16  Yes [provider]  furosemide (LASIX) 20 MG tablet Take 2 tablets (40 mg total) by mouth daily. Take 2 tablets (40 mg) every other day Patient taking differently: Take 40 mg by mouth See admin instructions. EVERY 5 DAYS 02/18/17  Yes Pricilla Riffle, MD  hydrALAZINE (APRESOLINE) 25 MG tablet Take 2 tablets (50 mg total) by mouth 3 (three) times daily. 12/31/16  Yes Pricilla Riffle, MD  Multiple  Vitamins-Minerals (PRESERVISION AREDS) CAPS Take 1 capsule by mouth daily.    Yes [provider]    Inpatient Medications    Scheduled Meds: . amLODipine  5 mg Oral BID  . aspirin EC  81 mg Oral Daily  . atorvastatin  20 mg Oral Daily  . carvedilol  3.125 mg Oral QHS  . clopidogrel  75 mg Oral Q breakfast  . fluticasone  1 spray Each Nare Daily  . furosemide  40 mg Intravenous Daily  . heparin  5,000 Units Subcutaneous Q8H  . hydrALAZINE  50 mg Oral TID  . sodium chloride flush  3 mL Intravenous Q12H   Continuous Infusions: . sodium chloride     PRN Meds: sodium chloride, acetaminophen, hydrALAZINE, ondansetron (ZOFRAN) IV, sodium chloride flush  Family History    Family History  Problem Relation Age of Onset  .  Cancer Mother        [name]  . Diabetes Sister   . Cancer Sister   . Hyperlipidemia Sister   . Heart attack Sister   . Hypertension Sister   . Cancer Sister        Breast  . Hyperlipidemia Sister   . Diabetes Son   . Hyperlipidemia Daughter   . Hypertension Daughter     Social History    Social History   Social History  . Marital status: Widowed    Spouse name: N/A  . Number of children: 6  . Years of education: 8th   Occupational History  . retired Retired   Social History Main Topics  . Smoking status: Former Smoker    Quit date: 08/24/2009  . Smokeless tobacco: Never Used  . Alcohol use No  . Drug use: No  . Sexual activity: No   Other Topics Concern  . Not on file   Social History Narrative  . No narrative on file     Review of Systems    General:  No chills, fever, night sweats or weight changes.  Cardiovascular:  No chest pain, edema, orthopnea, palpitations, paroxysmal nocturnal dyspnea. Positive for dyspnea at rest and on exertion.  Dermatological: No rash, lesions/masses Respiratory: No cough, Positive for dyspnea. Urologic: No hematuria, dysuria Abdominal:   No nausea, vomiting, diarrhea, bright red blood per  rectum, melena, or hematemesis Neurologic:  No visual changes, wkns, changes in mental status. All other systems reviewed and are otherwise negative except as noted above.  Physical Exam/Data    Blood pressure (!) 137/57, pulse 74, temperature 99 F (37.2 C), temperature source Oral, resp. rate 20, height  (1.702 m), weight 126 lb 1.6 oz (57.2 kg), SpO2 94 %.  General: Pleasant, elderly Caucasian female appearing in NAD Psych: Normal affect. Neuro: Alert and oriented X 3. Moves all extremities spontaneously. HEENT: Normal  Neck: Supple without bruits or JVD. Lungs:  Resp regular and unlabored, decreased breath sounds along bases bilaterally. Heart: RRR no s3, s4, 2/6 SEM along RUSB.  Abdomen: Soft, non-tender, non-distended, BS + x 4.  Extremities: No clubbing, cyanosis or edema. DP/PT/Radials 2+ and equal bilaterally.   EKG:  The EKG was personally reviewed and demonstrates: NSR, HR 78, with mild LVH and repol abnormality (similar to prior tracings).    Labs/Studies     Relevant CV Studies:  Echocardiogram: 10/2016 Study Conclusions  - Left ventricle: The cavity size was normal. There was moderate   concentric hypertrophy. Systolic function was hyperdynamic. The   estimated ejection fraction was in the range of 70% to 80%. There   was dynamic obstruction at restin the mid cavity, with a peak   velocity of 200 cm/sec and a peak gradient of 16 mm Hg. Wall   motion was normal; there were no regional wall motion   abnormalities. Doppler parameters are consistent with abnormal   left ventricular relaxation (grade 1 diastolic dysfunction). - Mitral valve: Calcified annulus. - Left atrium: The atrium was mildly dilated. - Right atrium: The atrium was mildly dilated. - Atrial septum: There was a probable, small patent foramen ovale.   Doppler showed a very small left-to-right atrial level shunt, in   the baseline state. - Pericardium, extracardiac: A small pericardial  effusion was   identified circumferential to the heart. There was a left pleural   effusion.   Laboratory Data:  Chemistry Recent Labs Lab 06/05/17 1049 06/05/17 1917 06/06/17 0718  NA 138  136 139  K 4.2 4.0 4.1  CL 107 106 109  CO2 22 20* 21*  GLUCOSE 127* 101* 80  BUN 30* 29* 27*  CREATININE 2.22* 2.16* 2.17*  CALCIUM 8.6* 7.9* 7.9*  GFRNONAA 19* 20* 20*  GFRAA 22* 23* 23*  ANIONGAP No results for input(s): PROT, ALBUMIN, AST, ALT, ALKPHOS, BILITOT in the last 168 hours. Hematology Recent Labs Lab 06/05/17 1049 06/06/17 0718  WBC 7.8 6.2  RBC 4.37 3.70*  HGB 12.2 10.1*  HCT 37.8 32.2*  MCV 86.5 87.0  MCH 27.9 27.3  MCHC 32.3 31.4  RDW 15.7* 15.9*  PLT 256 194   Cardiac Enzymes Recent Labs Lab 06/05/17 1049 06/05/17 1917 06/06/17 0014 06/06/17 0718  TROPONINI 0.04* 0.04* 0.04* 0.04*   No results for input(s): TROPIPOC in the last 168 hours.  BNP Recent Labs Lab 06/05/17 1917  BNP 1,485.2*    DDimer No results for input(s): DDIMER in the last 168 hours.  Radiology/Studies:  Dg Chest 2 View  Result Date: 06/05/2017 CLINICAL DATA:  Shortness of breath. EXAM: CHEST  2 VIEW COMPARISON:  04/19/2017 FINDINGS: The cardiac silhouette is stably enlarged. Mediastinal contours appear intact. Calcific atherosclerotic disease of the aorta. There has been interval increase in the size of left pleural effusion, which is now moderate. There has also been interval increase in size of the small right pleural effusion. Previously noted bilateral lower lobe airspace consolidation is mostly obscured by the pleural effusions. No evidence of pneumothorax. Osseous structures are without acute abnormality. Soft tissues are grossly normal. IMPRESSION: Enlargement of the bilateral pleural effusions, moderate in size on the left and small on the right. Enlarged cardiac silhouette. Calcific atherosclerotic disease of the aorta. Electronically Signed   By: Ted Mcalpine M.D.   On: 06/05/2017 11:09    Assessment & Plan    1. Acute on Chronic Diastolic CHF - she presented with worsening dyspnea at rest and on exertion. Denies any associated orthopnea, PND, lower extremity edema, abdominal distension, chest pain, or palpitations. Reports weights have been stable on her home scales (baseline 125 -126 lbs), at 126 lbs today.  - BNP 1485. CXR showed enlargement of the bilateral pleural effusions with a moderate effusion on the left and small on the right. Currently on IV Lasix  daily with a net output of -765 mL thus far. Continue with diuresis for an additional day. May require thoracentesis later this admission.   2. Bilateral Pleural Effusions - she has a history of recurrent effusions, requiring thoracentesis in the past.  - CXR on admission showed enlargement of the bilateral pleural effusions with a moderate effusion on the left and small on the right.  - still has decreased breath sounds on examination. Repeat CXR in AM and consider thoracentesis if no improvement with IV diuresis.   3. SEM - appreciated on examination along RUSB - echo in 10/2016 showed aortic sclerosis without stenosis.  - repeat echocardiogram is pending to assess LV function and wall motion.   4. Carotid Artery Stenosis - s/p R CEA in 10/2015. - continue ASA and statin therapy.   5. HTN  - BP at 137/57 - 170/71 within the past 24 hours.  - currently on Coreg 3.125mg  BID and Hydralazine  TID. Can titrate Hydralazine as needed.   6. Stage 4 CKD - creatinine at 2.17 today (close to baseline).   Signed, Ellsworth Lennox, PA-C 06/06/2017, 11:06 AM Pager: [phone]  The patient was  seen, examined and discussed with [place], PA-C and I agree with the above.    83 y.o. female with past medical history of carotid artery disease (s/p R CEA in 10/2015), chronic diastolic CHF, Stage 3 CKD, recurrent pleural effusions (s/p prior thoracentesis), HTN, and  HLD who is being seen today for the evaluation of CHF at the request of Dr. Hanley Ben.  She is followed by Dr Tenny Craw, last seen on 05/14/2017 and reported doing well at that time. Weight was stable at 127 lbs and she was continued on her current medication regimen. She presented to [facility] ED on 06/05/2017 for worsening dyspnea at rest and on exertion. Denies any associated orthopnea, PND, lower extremity edema, abdominal distension, chest pain, or palpitations. Reports weights have been stable on her home scales (baseline 125 -126 lbs) and she reports compliance with her PTA diuretic regimen (Lasix  daily).   Initial labs showed WBC of 7.8, Hgb 12.2, platelets 256, Na+ 138, K+ 4.2, creatinine 2.22 (similar to prior values). BNP 1485.  Physical exam reveals mild JVD elevation, right lung CTA, left lung with decreased BS at the base, no LE edema.  She has been started on Lasix  daily with only a net output of -765 mL thus far. However, she reports significant improvement in her respiratory status.   Her Crea is high with baseline 2.0, now 2.1,  Echo shows hyperdynamic LVEF ~ 70%, small LV cavity, LVOT obstruction and mid-cavitary obstruction. Small pericardial effusion and   Plan: Continue the same dose of Lasix for one more day, with underlying HOCM we should be careful with over diuresis. I will increase carvedilol 3.125 mg po BID to 12.5 mg po BID.  She would benefit from left sided thoracentesis, discussed with the primary team.  Tobias Alexander, MD 06/06/2017

## 2017-06-06 NOTE — Progress Notes (Signed)
  Echocardiogram 2D Echocardiogram has been performed.  Celene Skeen 06/06/2017, 11:42 AM

## 2017-06-06 NOTE — Evaluation (Signed)
Physical Therapy Evaluation Patient Details Name: [name] MRN: [id number] DOB: [birth year] Today's Date: 06/06/2017   History of Present Illness  [name] is a 83 y.o. female with Past medical history of Chronic diastolic CHF, left pleural effusion, CAD, carotid artery stenosis, anemia, CKD stage III-IV. REports shortness of breath over past month  Clinical Impression   Pt admitted with above diagnosis. Pt currently with functional limitations due to the deficits listed below (see PT Problem List). Presents with generalized weakness, decreased funcitonal capacity; at this point we must consider home O2; will continue to monitor;  Pt will benefit from skilled PT to increase their independence and safety with mobility to allow discharge to the venue listed below.       Follow Up Recommendations Home health PT    Equipment Recommendations  Other (comment) (supplemental O2)    Recommendations for Other Services       Precautions / Restrictions Precautions Precautions: Fall Precaution Comments: Slight fall risk; watch O2 sats      Mobility  Bed Mobility                  Transfers Overall transfer level: Needs assistance Equipment used: None Transfers: Sit to/from Stand Sit to Stand: Min guard         General transfer comment: Did not use assistive device, but noted dependent on UEs to push up  Ambulation/Gait Ambulation/Gait assistance: Min guard Ambulation Distance (Feet): 35 Feet (to and from bathroom) Assistive device: None Gait Pattern/deviations: Step-through pattern     General Gait Details: Noted occasional reachingout for UE support on furniture; desatted to 86% with amb short diatance on Room Air  Stairs            Wheelchair Mobility    Modified Rankin (Stroke Patients Only)       Balance Overall balance assessment: Needs assistance   Sitting balance-Leahy Scale: Good       Standing balance-Leahy Scale: Fair Standing  balance comment: stood at sink to wash hands                             Pertinent Vitals/Pain Pain Assessment: No/denies pain    Home Living Family/patient expects to be discharged to:: Private residence Living Arrangements: Children Available Help at Discharge: Family;Other (Comment) (near 24 hours; alone about 2 hours at a time, max) Type of Home: House Home Access: Stairs to enter   [employer] of Steps: 3 (shallow steps) Home Layout: Two level;Able to live on main level with bedroom/bathroom Home Equipment: Shower seat      Prior Function Level of Independence: Independent         Comments: Has not needed RW for amb for a few years; briefly used RW in 2014 post a hospital stay     Hand Dominance        Extremity/Trunk Assessment   Upper Extremity Assessment Upper Extremity Assessment: Overall WFL for tasks assessed    Lower Extremity Assessment Lower Extremity Assessment: Generalized weakness       Communication   Communication: HOH  Cognition Arousal/Alertness: Awake/alert Behavior During Therapy: WFL for tasks assessed/performed Overall Cognitive Status: Within Functional Limits for tasks assessed                                        General Comments General  comments (skin integrity, edema, etc.): See also other note of this date for O2 qualifying note    Exercises     Assessment/Plan    PT Assessment Patient needs continued PT services  PT Problem List Decreased strength;Decreased activity tolerance;Decreased balance;Decreased mobility;Decreased knowledge of use of DME;Decreased knowledge of precautions;Cardiopulmonary status limiting activity       PT Treatment Interventions DME instruction;Gait training;Stair training;Functional mobility training;Therapeutic activities;Therapeutic exercise;Balance training;Patient/family education    PT Goals (Current goals can be found in the Care Plan section)   Acute Rehab PT Goals Patient Stated Goal: feel better PT Goal Formulation: With patient Time For Goal Achievement: 06/20/17 Potential to Achieve Goals: Good    Frequency Min 3X/week   Barriers to discharge        Co-evaluation               AM-PAC PT "6 Clicks" Daily Activity  Outcome Measure Difficulty turning over in bed (including adjusting bedclothes, sheets and blankets)?: None Difficulty moving from lying on back to sitting on the side of the bed? : A Little Difficulty sitting down on and standing up from a chair with arms (e.g., wheelchair, bedside commode, etc,.)?: A Little Help needed moving to and from a bed to chair (including a wheelchair)?: None Help needed walking in hospital room?: A Little Help needed climbing 3-5 steps with a railing? : A Little 6 Click Score: 20    End of Session Equipment Utilized During Treatment: Gait belt Activity Tolerance: Patient tolerated treatment well Patient left: in chair;with call bell/phone within reach;with family/visitor present Nurse Communication: Mobility status PT Visit Diagnosis: Unsteadiness on feet (R26.81);Muscle weakness (generalized) (M62.81)    Time: 1610-9604 PT Time Calculation (min) (ACUTE ONLY): 16 min   Charges:   PT Evaluation $PT Eval Low Complexity: 1 Low     PT G Codes:        Van Clines, PT  Acute Rehabilitation Services Pager [phone] Office [phone]   Levi Aland 06/06/2017, 8:50 AM

## 2017-06-06 NOTE — Progress Notes (Signed)
Patient ID: [name], female   DOB: 08-Dec-1930, 83 y.o.   MRN: [id number]  PROGRESS NOTE    [name]  WUJ:[id number] DOB: 26-Feb-1931 DOA: 06/05/2017 PCP: Daisy Floro, MD   Brief Narrative:  83 year old female with past medical history of chronic diastolic CHF, left pleural effusion status post thoracentesis, CAD, carotid artery stenosis, anemia, CKD Stage IV presented with worsening shortness of breath. She was admitted with CHF exacerbation and started on intravenous Lasix.  Assessment & Plan:   Principal Problem:   Acute on chronic diastolic CHF (congestive heart failure) (HCC) Active Problems:   Hyperlipidemia LDL goal <100   Essential hypertension   PVD (peripheral vascular disease) with claudication (HCC)   Carotid artery stenosis   Pernicious anemia   Chronic renal insufficiency, stage 3 (moderate) (HCC)   Anemia in chronic kidney disease   Acute on chronic diastolic CHF (congestive heart failure)  - increase Lasix to 40 mg IV daily. strict input and output. Daily weights. Negative balance of 1025 ML since admission - Cardiology consulted. Follow 2-D echo.  Hypoxia probably secondary to above - Continue oxygen supplementation and wean off as needed  Recurrent Left pleural effusion - continue diuresis. Discussed with Dr.Nelson from cardiology  agreed that patient would benefit from left-sided thoracentesis. Will order thoracentesis and pleural fluid evaluation.   Chronic kidney disease stage IV - monitor creatinine. Continue Lasix for now  Coronary artery disease And history of carotid artery stenosis -Continuing aspirin and Plavix.  Essential hypertension. - continue Coreg and Lasix. Monitor blood pressure  Chronic anemia - Monitor hemoglobin. - outpatient follow-up   DVT prophylaxis: heparin Code Status:  full Family Communication: none at bedside Disposition Plan:Home in  2-3 days  Consultants: cardiology  Procedures: Echo  pending  Antimicrobials: none    Subjective: Patient seen and examined at bedside. She complains of shortness of breath. No overnight fever, nausea or vomiting.  Objective: Vitals:   06/05/17 1802 06/06/17 0032 06/06/17 0515 06/06/17 1216  BP: (!) 170/70 (!) 158/67 (!) 137/57 (!) 135/56  Pulse: 77 71 74 73  Resp: Temp: 98 F (36.7 C) 97.6 F (36.4 C) 99 F (37.2 C) 98.6 F (37 C)  TempSrc: Oral Oral Oral Oral  SpO2: 96% 95% 94% 94%  Weight: 57.5 kg (126 lb 12.2 oz)  57.2 kg (126 lb 1.6 oz)   Height:  (1.702 m)       Intake/Output Summary (Last 24 hours) at 06/06/17 1344 Last data filed at 06/06/17 0929  Gross per 24 hour  Intake              440 ml  Output             1225 ml  Net             -785 ml   Filed Weights   06/05/17 1802 06/06/17 0515  Weight: 57.5 kg (126 lb 12.2 oz) 57.2 kg (126 lb 1.6 oz)    Examination:  General exam: elderly female lying in bed in no acute distress Respiratory system: Bilateral decreased breath sound at bases with basilar crackles Cardiovascular system: S1 & S2 heard, rate controlled  Gastrointestinal system: Abdomen is nondistended, soft and nontender. Normal bowel sounds heard. Extremities: No cyanosis, clubbing; trace edema    Data Reviewed: I have personally reviewed following labs and imaging studies  CBC:  Recent Labs Lab 06/05/17 1049 06/06/17 0718  WBC 7.8 6.2  NEUTROABS  --  4.6  HGB 12.2 10.1*  HCT 37.8 32.2*  MCV 86.5 87.0  PLT 256 194   Basic Metabolic Panel:  Recent Labs Lab 06/05/17 1049 06/05/17 1917 06/06/17 0718  NA 138 136 139  K 4.2 4.0 4.1  CL 107 106 109  CO2 22 20* 21*  GLUCOSE 127* 101* 80  BUN 30* 29* 27*  CREATININE 2.22* 2.16* 2.17*  CALCIUM 8.6* 7.9* 7.9*  MG  --  2.1  --    GFR: Estimated Creatinine Clearance: 17.4 mL/min (A) (by C-G formula based on SCr of 2.17 mg/dL (H)). Liver Function Tests: No results for input(s): AST, ALT, ALKPHOS, BILITOT, PROT,  ALBUMIN in the last 168 hours. No results for input(s): LIPASE, AMYLASE in the last 168 hours. No results for input(s): AMMONIA in the last 168 hours. Coagulation Profile: No results for input(s): INR, PROTIME in the last 168 hours. Cardiac Enzymes:  Recent Labs Lab 06/05/17 1049 06/05/17 1917 06/06/17 0014 06/06/17 0718  TROPONINI 0.04* 0.04* 0.04* 0.04*   BNP (last 3 results)  Recent Labs  12/31/16 1025 01/22/17 1346 02/01/17 1453  PROBNP 11,403* 11,385* 11,969*   HbA1C: No results for input(s): HGBA1C in the last 72 hours. CBG: No results for input(s): GLUCAP in the last 168 hours. Lipid Profile: No results for input(s): CHOL, HDL, LDLCALC, TRIG, CHOLHDL, LDLDIRECT in the last 72 hours. Thyroid Function Tests:  Recent Labs  06/05/17 1917  TSH 4.141   Anemia Panel: No results for input(s): VITAMINB12, FOLATE, FERRITIN, TIBC, IRON, RETICCTPCT in the last 72 hours. Sepsis Labs: No results for input(s): PROCALCITON, LATICACIDVEN in the last 168 hours.  No results found for this or any previous visit (from the past 240 hour(s)).       Radiology Studies: Dg Chest 2 View  Result Date: 06/05/2017 CLINICAL DATA:  Shortness of breath. EXAM: CHEST  2 VIEW COMPARISON:  04/19/2017 FINDINGS: The cardiac silhouette is stably enlarged. Mediastinal contours appear intact. Calcific atherosclerotic disease of the aorta. There has been interval increase in the size of left pleural effusion, which is now moderate. There has also been interval increase in size of the small right pleural effusion. Previously noted bilateral lower lobe airspace consolidation is mostly obscured by the pleural effusions. No evidence of pneumothorax. Osseous structures are without acute abnormality. Soft tissues are grossly normal. IMPRESSION: Enlargement of the bilateral pleural effusions, moderate in size on the left and small on the right. Enlarged cardiac silhouette. Calcific atherosclerotic disease  of the aorta. Electronically Signed   By: Ted Mcalpine M.D.   On: 06/05/2017 11:09        Scheduled Meds: . lidocaine (PF)      . amLODipine  5 mg Oral BID  . aspirin EC  81 mg Oral Daily  . atorvastatin  20 mg Oral Daily  . carvedilol  12.5 mg Oral QHS  . carvedilol  6.25 mg Oral Once  . clopidogrel  75 mg Oral Q breakfast  . fluticasone  1 spray Each Nare Daily  . furosemide  40 mg Intravenous Daily  . heparin  5,000 Units Subcutaneous Q8H  . hydrALAZINE  50 mg Oral TID  . sodium chloride flush  3 mL Intravenous Q12H   Continuous Infusions: . sodium chloride       LOS: 1 day        Glade Lloyd, MD [facility] Pager [phone]  If 7PM-7AM, please contact night-coverage [email or web address] Password [email or web address] 06/06/2017, 1:44 PM

## 2017-06-07 ENCOUNTER — Inpatient Hospital Stay (HOSPITAL_COMMUNITY): Payer: Medicare Other

## 2017-06-07 DIAGNOSIS — R0602 Shortness of breath: Secondary | ICD-10-CM

## 2017-06-07 DIAGNOSIS — R0609 Other forms of dyspnea: Secondary | ICD-10-CM

## 2017-06-07 DIAGNOSIS — J90 Pleural effusion, not elsewhere classified: Secondary | ICD-10-CM

## 2017-06-07 DIAGNOSIS — Z9889 Other specified postprocedural states: Secondary | ICD-10-CM

## 2017-06-07 LAB — CBC WITH DIFFERENTIAL/PLATELET
BASOS PCT: 1 %
Basophils Absolute: 0.1 10*3/uL (ref 0.0–0.1)
EOS ABS: 0.2 10*3/uL (ref 0.0–0.7)
Eosinophils Relative: 2 %
HCT: 33.7 % — ABNORMAL LOW (ref 36.0–46.0)
HEMOGLOBIN: 10.6 g/dL — AB (ref 12.0–15.0)
Lymphocytes Relative: 8 %
Lymphs Abs: 0.6 10*3/uL — ABNORMAL LOW (ref 0.7–4.0)
MCH: 27.5 pg (ref 26.0–34.0)
MCHC: 31.5 g/dL (ref 30.0–36.0)
MCV: 87.5 fL (ref 78.0–100.0)
Monocytes Absolute: 0.5 10*3/uL (ref 0.1–1.0)
Monocytes Relative: 7 %
NEUTROS PCT: 82 %
Neutro Abs: 6.6 10*3/uL (ref 1.7–7.7)
Platelets: 204 10*3/uL (ref 150–400)
RBC: 3.85 MIL/uL — AB (ref 3.87–5.11)
RDW: 15.9 % — ABNORMAL HIGH (ref 11.5–15.5)
WBC: 8 10*3/uL (ref 4.0–10.5)

## 2017-06-07 LAB — BASIC METABOLIC PANEL
Anion gap: 9 (ref 5–15)
BUN: 30 mg/dL — ABNORMAL HIGH (ref 6–20)
CALCIUM: 8.1 mg/dL — AB (ref 8.9–10.3)
CO2: 23 mmol/L (ref 22–32)
CREATININE: 2.44 mg/dL — AB (ref 0.44–1.00)
Chloride: 106 mmol/L (ref 101–111)
GFR calc non Af Amer: 17 mL/min — ABNORMAL LOW (ref >60)
GFR, EST AFRICAN AMERICAN: 20 mL/min — AB (ref >60)
Glucose, Bld: 93 mg/dL (ref 65–99)
Potassium: 4.3 mmol/L (ref 3.5–5.1)
SODIUM: 138 mmol/L (ref 135–145)

## 2017-06-07 LAB — MAGNESIUM: MAGNESIUM: 2.2 mg/dL (ref 1.7–2.4)

## 2017-06-07 NOTE — Progress Notes (Signed)
Patient ID: [name], female   DOB: December 08, 1930, 83 y.o.   MRN: [id number]  PROGRESS NOTE    ARYAH DOERING  WUJ:[id number] DOB: 1931/08/05 DOA: 06/05/2017 PCP: Daisy Floro, MD   Brief Narrative:  83 year old female with past medical history of chronic diastolic CHF, left pleural effusion status post thoracentesis, CAD, carotid artery stenosis, anemia, CKD Stage IV presented with worsening shortness of breath. She was admitted with CHF exacerbation and started on intravenous Lasix.  Assessment & Plan:   Principal Problem:   Acute on chronic diastolic CHF (congestive heart failure) (HCC) Active Problems:   Hyperlipidemia LDL goal <100   Essential hypertension   PVD (peripheral vascular disease) with claudication (HCC)   Carotid artery stenosis   Pernicious anemia   Chronic renal insufficiency, stage 3 (moderate) (HCC)   Anemia in chronic kidney disease   Acute on chronic diastolic CHF (congestive heart failure)  - cardiology following. Currently on Lasix 40 mg IV daily. Hold dose for today because of slightly worsening renal function and follow further recommendations from cardiology. strict input and output. Daily weights. Negative balance of 940 ML since admission - Echo showed EF of 70-75% with grade 1 diastolic dysfunction  Hypoxia probably secondary to above - Continue oxygen supplementation and wean off as needed  Recurrent Left pleural effusion - status post thoracentesis on 06/06/2017 and removal of 1.2 L fluid. Repeat chest x-ray for tomorrow including decubitus films.  Chronic kidney disease stage IV - creatinine slightly worse today at 2.44. Hold Lasix and repeat a.m. Labs  Coronary artery disease And history of carotid artery stenosis -Continuing aspirin and Plavix.  Essential hypertension. - continue Coreg and Lasix. Monitor blood pressure  Chronic anemia - Monitor hemoglobin. - outpatient follow-up  Generalized deconditioning - PT eval. care  management consult   DVT prophylaxis: heparin Code Status:  full Family Communication: none at bedside Disposition Plan:Home in  2-3 days  Consultants: cardiology  Procedures: Echo on 06/06/2017 Study Conclusions  - Left ventricle: The cavity size was normal. There was severe   concentric hypertrophy. Systolic function was normal. The   estimated ejection fraction was in the range of 70% to 75%. Wall   motion was normal; there were no regional wall motion   abnormalities. Doppler parameters are consistent with abnormal   left ventricular relaxation (grade 1 diastolic dysfunction).   Doppler parameters are consistent with elevated ventricular   end-diastolic filling pressure. - Aortic valve: Trileaflet; mildly thickened, mildly calcified   leaflets. There was no regurgitation. - Mitral valve: Calcified annulus. Mildly thickened leaflets .   There was mild regurgitation. - Left atrium: The atrium was normal in size. - Right ventricle: Systolic function was normal. - Tricuspid valve: There was trivial regurgitation. - Inferior vena cava: The vessel was normal in size. - Pericardium, extracardiac: A mild pericardial effusion was   identified anterior to the heart. Features were not consistent   with tamponade physiology. There was a large left pleural   effusion.  Impressions:  - Severe LVH, possibly hypertrophic cardiomyopathy. Hyperdynamic   LVEF, mid cavitary and LVOT gradient. Elevated LVEDP.   Antimicrobials: none    Subjective: Patient seen and examined at bedside. She feels slightly better but still complains of exertional shortness of breath along with pain in the lower part of her bilateral chest. No overnight fever, nausea or vomiting.  Objective: Vitals:   06/06/17 1359 06/06/17 1940 06/07/17 0550 06/07/17 0944  BP: (!) 149/63 (!) 130/52 132/60 138/65  Pulse:  61 69 (!) 57  Resp:  18 18   Temp:  98.4 F (36.9 C) 98.2 F (36.8 C) (!) 97.5 F (36.4 C)    TempSrc:  Oral Oral Oral  SpO2:  96% 95% 99%  Weight:   56.1 kg (123 lb 11.2 oz)   Height:        Intake/Output Summary (Last 24 hours) at 06/07/17 1055 Last data filed at 06/07/17 9604  Gross per 24 hour  Intake              340 ml  Output              375 ml  Net              -35 ml   Filed Weights   06/05/17 1802 06/06/17 0515 06/07/17 0550  Weight: 57.5 kg (126 lb 12.2 oz) 57.2 kg (126 lb 1.6 oz) 56.1 kg (123 lb 11.2 oz)    Examination:  General exam: elderly female lying in bed in no acute distress Respiratory system: Bilateral decreased breath sound at bases with basilar crackles Cardiovascular system: S1 & S2 heard, rate controlled  Gastrointestinal system: Abdomen is nondistended, soft and nontender. Normal bowel sounds heard. Extremities: No cyanosis, clubbing; trace edema    Data Reviewed: I have personally reviewed following labs and imaging studies  CBC:  Recent Labs Lab 06/05/17 1049 06/06/17 0718 06/07/17 0540  WBC 7.8 6.2 8.0  NEUTROABS  --  4.6 6.6  HGB 12.2 10.1* 10.6*  HCT 37.8 32.2* 33.7*  MCV 86.5 87.0 87.5  PLT 256 194 204   Basic Metabolic Panel:  Recent Labs Lab 06/05/17 1049 06/05/17 1917 06/06/17 0718 06/07/17 0540  NA 138 136 139 138  K 4.2 4.0 4.1 4.3  CL 107 106 109 106  CO2 22 20* 21* 23  GLUCOSE 127* 101* 80 93  BUN 30* 29* 27* 30*  CREATININE 2.22* 2.16* 2.17* 2.44*  CALCIUM 8.6* 7.9* 7.9* 8.1*  MG  --  2.1  --  2.2   GFR: Estimated Creatinine Clearance: 15.2 mL/min (A) (by C-G formula based on SCr of 2.44 mg/dL (H)). Liver Function Tests: No results for input(s): AST, ALT, ALKPHOS, BILITOT, PROT, ALBUMIN in the last 168 hours. No results for input(s): LIPASE, AMYLASE in the last 168 hours. No results for input(s): AMMONIA in the last 168 hours. Coagulation Profile: No results for input(s): INR, PROTIME in the last 168 hours. Cardiac Enzymes:  Recent Labs Lab 06/05/17 1049 06/05/17 1917 06/06/17 0014  06/06/17 0718  TROPONINI 0.04* 0.04* 0.04* 0.04*   BNP (last 3 results)  Recent Labs  12/31/16 1025 01/22/17 1346 02/01/17 1453  PROBNP 11,403* 11,385* 11,969*   HbA1C: No results for input(s): HGBA1C in the last 72 hours. CBG: No results for input(s): GLUCAP in the last 168 hours. Lipid Profile: No results for input(s): CHOL, HDL, LDLCALC, TRIG, CHOLHDL, LDLDIRECT in the last 72 hours. Thyroid Function Tests:  Recent Labs  06/05/17 1917  TSH 4.141   Anemia Panel: No results for input(s): VITAMINB12, FOLATE, FERRITIN, TIBC, IRON, RETICCTPCT in the last 72 hours. Sepsis Labs: No results for input(s): PROCALCITON, LATICACIDVEN in the last 168 hours.  Recent Results (from the past 240 hour(s))  Gram stain     Status: None   Collection Time: 06/06/17  1:57 PM  Result Value Ref Range Status   Specimen Description FLUID LEFT PLEURAL  Final   Special Requests NONE  Final   Gram Stain  Final    FEW WBC PRESENT,BOTH PMN AND MONONUCLEAR NO ORGANISMS SEEN    Report Status 06/06/2017 FINAL  Final         Radiology Studies: Dg Chest 1 View  Result Date: 06/06/2017 CLINICAL DATA:  S/p left thoracentesis EXAM: CHEST 1 VIEW COMPARISON:  06/05/2017 FINDINGS: Reduction in LEFT pleural fluid following thoracentesis. No LEFT pneumothorax appreciated. No RIGHT pneumothorax. Lungs are hyperinflated. Cardiac silhouette is enlarged. IMPRESSION: No pneumothorax appreciated following LEFT thoracentesis. Reduction in LEFT pleural fluid. Electronically Signed   By: Genevive Bi M.D.   On: 06/06/2017 14:32   Dg Chest 2 View  Result Date: 06/07/2017 CLINICAL DATA:  Shortness of breath. EXAM: CHEST  2 VIEW COMPARISON:  Chest x-ray from yesterday. FINDINGS: Stable cardiomegaly. Normal pulmonary vascularity. Atherosclerotic calcification of the aortic arch. New small right pleural effusion. Slightly increased trace left pleural effusion. Bibasilar atelectasis. No pneumothorax. No acute  osseous abnormality. IMPRESSION: New small right pleural effusion and slight interval increase in size of trace left pleural effusion. Electronically Signed   By: Obie Dredge M.D.   On: 06/07/2017 09:22   US Thoracentesis Asp Pleural Space W/img Guide  Result Date: 06/06/2017 INDICATION: Patient with history of CHF, left pleural effusion. Request is made for diagnostic and therapeutic left thoracentesis. EXAM: ULTRASOUND GUIDED DIAGNOSTIC AND THERAPEUTIC LEFT THORACENTESIS MEDICATIONS: 10 mL 1% lidocaine COMPLICATIONS: None immediate. PROCEDURE: An ultrasound guided thoracentesis was thoroughly discussed with the patient and questions answered. The benefits, risks, alternatives and complications were also discussed. The patient understands and wishes to proceed with the procedure. Written consent was obtained. Ultrasound was performed to localize and mark an adequate pocket of fluid in the left chest. The area was then prepped and draped in the normal sterile fashion. 1% Lidocaine was used for local anesthesia. Under ultrasound guidance a Safe-T-Centesis catheter was introduced. Thoracentesis was performed. The catheter was removed and a dressing applied. FINDINGS: A total of approximately 1.2 liters of clear, yellow fluid was removed. Samples were sent to the laboratory as requested by the clinical team. IMPRESSION: Successful ultrasound guided diagnostic and therapeutic left thoracentesis yielding 1.2 liters of pleural fluid. Read by:  Loyce Dys PA-C Electronically Signed   By: Irish Lack M.D.   On: 06/06/2017 14:23        Scheduled Meds: . amLODipine  5 mg Oral BID  . aspirin EC  81 mg Oral Daily  . atorvastatin  20 mg Oral Daily  . carvedilol  12.5 mg Oral QHS  . carvedilol  6.25 mg Oral Once  . clopidogrel  75 mg Oral Q breakfast  . fluticasone  1 spray Each Nare Daily  . furosemide  40 mg Intravenous Daily  . heparin  5,000 Units Subcutaneous Q8H  . hydrALAZINE  50 mg Oral  TID  . sodium chloride flush  3 mL Intravenous Q12H   Continuous Infusions: . sodium chloride       LOS: 2 days        Glade Lloyd, MD [facility] Pager [phone]  If 7PM-7AM, please contact night-coverage [email or web address] Password [email or web address] 06/07/2017, 10:55 AM

## 2017-06-07 NOTE — Care Management Note (Signed)
Case Management Note  Patient Details  Name: REDA CITRON MRN: [id number] Date of Birth: Dec 17, 1930  Subjective/Objective: CHF                  Action/Plan: Patient lives at home with daughter; PCP: Daisy Floro, MD; has private insurance with [facility] with prescription drug coverage;   Expected Discharge Date:   possibly 06/10/2017               Expected Discharge Plan:  Home w Home Health Services  Discharge planning Services  CM Consult  Status of Service:  In process, will continue to follow  Reola Mosher [phone] 06/07/2017, 12:46 PM

## 2017-06-07 NOTE — Evaluation (Signed)
Occupational Therapy Evaluation Patient Details Name: [name] MRN: [id number] DOB: 11-11-1930 Today's Date: 06/07/2017    History of Present Illness [name] is a 83 y.o. female with Past medical history of Chronic diastolic CHF, left pleural effusion, CAD, carotid artery stenosis, anemia, CKD stage III-IV. REports shortness of breath over past month   Clinical Impression   Pt. Has decreased I and safety with ADLs and moblity. Pt. Will need education on energy conservation. Pt. Has decreased performance with ADLs at home secondary to cardiopulmonary status. Pt. dtr reports that Pt. Can take up to 30 min to get dressed secondary needing to take rest breaks.     Follow Up Recommendations  Home health OT    Equipment Recommendations  None recommended by OT    Recommendations for Other Services       Precautions / Restrictions Precautions Precautions: Fall Precaution Comments: Slight fall risk; watch O2 sats Restrictions Weight Bearing Restrictions: No      Mobility Bed Mobility                  Transfers Overall transfer level: Needs assistance   Transfers: Stand Pivot Transfers Sit to Stand: Min guard Stand pivot transfers: Min guard            Balance                                           ADL either performed or assessed with clinical judgement   ADL Overall ADL's : Needs assistance/impaired Eating/Feeding: Independent   Grooming: Wash/dry hands;Wash/dry face;Oral care;Supervision/safety;Standing   Upper Body Bathing: Supervision/ safety;Set up;Sitting   Lower Body Bathing: Min guard;Sit to/from stand   Upper Body Dressing : Minimal assistance;Sitting   Lower Body Dressing: Min guard;Sit to/from stand   Toilet Transfer: Min guard;Ambulation   Toileting- Clothing Manipulation and Hygiene: Min guard;Sit to/from stand       Functional mobility during ADLs: Min guard General ADL Comments: Pt. has decreased  endurance and requires multiple rest breaks.      Vision Baseline Vision/History: Legally blind;Macular Degeneration Patient Visual Report: No change from baseline       Perception     Praxis      Pertinent Vitals/Pain Pain Assessment: No/denies pain     Hand Dominance     Extremity/Trunk Assessment Upper Extremity Assessment Upper Extremity Assessment: Generalized weakness           Communication Communication Communication: HOH   Cognition Arousal/Alertness: Awake/alert Behavior During Therapy: WFL for tasks assessed/performed Overall Cognitive Status: Within Functional Limits for tasks assessed                                     General Comments       Exercises     Shoulder Instructions      Home Living Family/patient expects to be discharged to:: Private residence Living Arrangements: Children Available Help at Discharge: Family;Other (Comment) (near 24 hours; alone about 2 hours at a time, max) Type of Home: House Home Access: Stairs to enter [employer] of Steps: 3 (shallow steps)   Home Layout: Two level;Able to live on main level with bedroom/bathroom     Bathroom Shower/Tub: Producer, television/film/video: Standard     Home Equipment: Shower seat;Hand held  shower head   Additional Comments: Pt. dtr states that the bathroom is very small.       Prior Functioning/Environment Level of Independence: Needs assistance (Pnt was Min a with bathing and Mod I with dresssing. )    ADL's / Homemaking Assistance Needed: Pt. dtr do the cooking and cleaning. Pt. dtr assist with shower.             OT Problem List: Decreased activity tolerance;Decreased knowledge of use of DME or AE;Cardiopulmonary status limiting activity      OT Treatment/Interventions: Self-care/ADL training;Energy conservation;DME and/or AE instruction;Therapeutic activities;Patient/family education    OT Goals(Current goals can be found in the  care plan section) Acute Rehab OT Goals Patient Stated Goal: go home OT Goal Formulation: With patient Time For Goal Achievement: 06/21/17 Potential to Achieve Goals: Good  OT Frequency: Min 2X/week   Barriers to D/C:            Co-evaluation              AM-PAC PT "6 Clicks" Daily Activity     Outcome Measure Help from another person eating meals?: None Help from another person taking care of personal grooming?: A Little Help from another person toileting, which includes using toliet, bedpan, or urinal?: A Little Help from another person bathing (including washing, rinsing, drying)?: A Little Help from another person to put on and taking off regular upper body clothing?: A Little Help from another person to put on and taking off regular lower body clothing?: A Little 6 Click Score: 19   End of Session Equipment Utilized During Treatment: Gait belt Nurse Communication:  (OK therapy)  Activity Tolerance: Patient tolerated treatment well Patient left: in bed;with call bell/phone within reach;with family/visitor present  OT Visit Diagnosis: Unsteadiness on feet (R26.81);Low vision, both eyes (H54.2)                Time: 2440-1027 OT Time Calculation (min): 38 min Charges:  OT General Charges $OT Visit: 1 Visit OT Evaluation $OT Eval Moderate Complexity: 1 Mod OT Treatments $Self Care/Home Management : 8-22 mins G-Codes:     6 clicks  Lakota Markgraf 06/07/2017, 9:41 AM

## 2017-06-07 NOTE — Progress Notes (Signed)
Dr. Hanley Ben paged to inquire if IV lasix should be administered this morning with bump in creatinine. Call back received. Informed MD creatinine-2.44 (up from 2.17 yesterday). Verbal order from MD to hold AM dose of Lasix. Pt and family updated. Will continue to monitor.

## 2017-06-07 NOTE — Progress Notes (Signed)
Progress Note  Patient Name: [name] Date of Encounter: 06/07/2017  Primary Cardiologist: Dr. Tenny Craw  Subjective   Still with SOB but improved,  Some chest pain more of sharp pain possible due to pl effusions.  Inpatient Medications    Scheduled Meds: . amLODipine  5 mg Oral BID  . aspirin EC  81 mg Oral Daily  . atorvastatin  20 mg Oral Daily  . carvedilol  12.5 mg Oral QHS  . carvedilol  6.25 mg Oral Once  . clopidogrel  75 mg Oral Q breakfast  . fluticasone  1 spray Each Nare Daily  . furosemide  40 mg Intravenous Daily  . heparin  5,000 Units Subcutaneous Q8H  . hydrALAZINE  50 mg Oral TID  . sodium chloride flush  3 mL Intravenous Q12H   Continuous Infusions: . sodium chloride     PRN Meds: sodium chloride, acetaminophen, hydrALAZINE, ondansetron (ZOFRAN) IV, sodium chloride flush   Vital Signs    Vitals:   06/06/17 1354 06/06/17 1359 06/06/17 1940 06/07/17 0550  BP: (!) 142/64 (!) 149/63 (!) 130/52 132/60  Pulse:   61 69  Resp:   18 18  Temp:   98.4 F (36.9 C) 98.2 F (36.8 C)  TempSrc:   Oral Oral  SpO2:   96% 95%  Weight:    123 lb 11.2 oz (56.1 kg)  Height:        Intake/Output Summary (Last 24 hours) at 06/07/17 0902 Last data filed at 06/07/17 0700  Gross per 24 hour  Intake              460 ml  Output              375 ml  Net               85 ml   Filed Weights   06/05/17 1802 06/06/17 0515 06/07/17 0550  Weight: 126 lb 12.2 oz (57.5 kg) 126 lb 1.6 oz (57.2 kg) 123 lb 11.2 oz (56.1 kg)    Telemetry    SR - Personally Reviewed  ECG    No new - Personally Reviewed  Physical Exam   GEN: No acute distress.   Neck: No JVD Cardiac: RRR, 3/6 systolic murmur, no rubs, or gallops.  Respiratory: bilateral breath sounds to auscultation bilaterally, diminished in Rt, Lt with some rales. GI: Soft, nontender, non-distended  MS: No edema; No deformity. Neuro:  Nonfocal  Psych: Normal to flat affect   Labs    Chemistry Recent  Labs Lab 06/05/17 1917 06/06/17 0718 06/07/17 0540  NA 136 139 138  K 4.0 4.1 4.3  CL 106 109 106  CO2 20* 21* 23  GLUCOSE 101* 80 93  BUN 29* 27* 30*  CREATININE 2.16* 2.17* 2.44*  CALCIUM 7.9* 7.9* 8.1*  GFRNONAA 20* 20* 17*  GFRAA 23* 23* 20*  ANIONGAP Hematology Recent Labs Lab 06/05/17 1049 06/06/17 0718 06/07/17 0540  WBC 7.8 6.2 8.0  RBC 4.37 3.70* 3.85*  HGB 12.2 10.1* 10.6*  HCT 37.8 32.2* 33.7*  MCV 86.5 87.0 87.5  MCH 27.9 27.3 27.5  MCHC 32.3 31.4 31.5  RDW 15.7* 15.9* 15.9*  PLT 256 194 204    Cardiac Enzymes Recent Labs Lab 06/05/17 1049 06/05/17 1917 06/06/17 0014 06/06/17 0718  TROPONINI 0.04* 0.04* 0.04* 0.04*   No results for input(s): TROPIPOC in the last 168 hours.   BNP Recent Labs Lab 06/05/17 1917  BNP 1,485.2*     DDimer No results for input(s): DDIMER in the last 168 hours.   Radiology    Dg Chest 1 View  Result Date: 06/06/2017 CLINICAL DATA:  S/p left thoracentesis EXAM: CHEST 1 VIEW COMPARISON:  06/05/2017 FINDINGS: Reduction in LEFT pleural fluid following thoracentesis. No LEFT pneumothorax appreciated. No RIGHT pneumothorax. Lungs are hyperinflated. Cardiac silhouette is enlarged. IMPRESSION: No pneumothorax appreciated following LEFT thoracentesis. Reduction in LEFT pleural fluid. Electronically Signed   By: Genevive Bi M.D.   On: 06/06/2017 14:32   Dg Chest 2 View  Result Date: 06/05/2017 CLINICAL DATA:  Shortness of breath. EXAM: CHEST  2 VIEW COMPARISON:  04/19/2017 FINDINGS: The cardiac silhouette is stably enlarged. Mediastinal contours appear intact. Calcific atherosclerotic disease of the aorta. There has been interval increase in the size of left pleural effusion, which is now moderate. There has also been interval increase in size of the small right pleural effusion. Previously noted bilateral lower lobe airspace consolidation is mostly obscured by the pleural effusions. No evidence of  pneumothorax. Osseous structures are without acute abnormality. Soft tissues are grossly normal. IMPRESSION: Enlargement of the bilateral pleural effusions, moderate in size on the left and small on the right. Enlarged cardiac silhouette. Calcific atherosclerotic disease of the aorta. Electronically Signed   By: Ted Mcalpine M.D.   On: 06/05/2017 11:09   US Thoracentesis Asp Pleural Space W/img Guide  Result Date: 06/06/2017 INDICATION: Patient with history of CHF, left pleural effusion. Request is made for diagnostic and therapeutic left thoracentesis. EXAM: ULTRASOUND GUIDED DIAGNOSTIC AND THERAPEUTIC LEFT THORACENTESIS MEDICATIONS: 10 mL 1% lidocaine COMPLICATIONS: None immediate. PROCEDURE: An ultrasound guided thoracentesis was thoroughly discussed with the patient and questions answered. The benefits, risks, alternatives and complications were also discussed. The patient understands and wishes to proceed with the procedure. Written consent was obtained. Ultrasound was performed to localize and mark an adequate pocket of fluid in the left chest. The area was then prepped and draped in the normal sterile fashion. 1% Lidocaine was used for local anesthesia. Under ultrasound guidance a Safe-T-Centesis catheter was introduced. Thoracentesis was performed. The catheter was removed and a dressing applied. FINDINGS: A total of approximately 1.2 liters of clear, yellow fluid was removed. Samples were sent to the laboratory as requested by the clinical team. IMPRESSION: Successful ultrasound guided diagnostic and therapeutic left thoracentesis yielding 1.2 liters of pleural fluid. Read by:  Loyce Dys PA-C Electronically Signed   By: Irish Lack M.D.   On: 06/06/2017 14:23    Cardiac Studies   Echo 06/06/17 Study Conclusions  - Left ventricle: The cavity size was normal. There was severe   concentric hypertrophy. Systolic function was normal. The   estimated ejection fraction was in the  range of 70% to 75%. Wall   motion was normal; there were no regional wall motion   abnormalities. Doppler parameters are consistent with abnormal   left ventricular relaxation (grade 1 diastolic dysfunction).   Doppler parameters are consistent with elevated ventricular   end-diastolic filling pressure. - Aortic valve: Trileaflet; mildly thickened, mildly calcified   leaflets. There was no regurgitation. - Mitral valve: Calcified annulus. Mildly thickened leaflets .   There was mild regurgitation. - Left atrium: The atrium was normal in size. - Right ventricle: Systolic function was normal. - Tricuspid valve: There was trivial regurgitation. - Inferior vena cava: The vessel was normal in size. - Pericardium, extracardiac: A mild pericardial effusion was   identified anterior to the heart.  Features were not consistent   with tamponade physiology. There was a large left pleural   effusion.  Impressions:  - Severe LVH, possibly hypertrophic cardiomyopathy. Hyperdynamic   LVEF, mid cavitary and LVOT gradient. Elevated LVEDP.   Patient Profile     83 y.o. female with past medical history of carotid artery disease (s/p R CEA in 10/2015), chronic diastolic CHF, Stage 3 CKD, recurrent pleural effusions (s/p prior thoracentesis), HTN, and HLD admitted 06/05/17 for CHF with increased dyspnea.     Assessment & Plan    Acute on Chronic diastolic CHF --BNP on admit 1485  --neg 940 since admit and wt down from 126 to 123 lbs --Lasix 40 IV daily- will hold today with rise in Cr.  Bilateral pl effusions  --thoracentesis yesterday of Lt, 1.2 L clear yellow fluid- post CXR with reduction of fluid.  Today CXR with small rt effusion and Lt trace.  Severe LVH possible hypertrophic cardiomyopathy with hyperdynamic LVEF Mid cavitary and LVOT gradient   SEM -hx aortic sclerosis without stenosis   HTN --BP elevated at 170/71 initially now  130/52 and 132/60 --amlodipine 5, coreg 12.5 bid,  hydralazine 50 mg TID   CKD- 4 --Cr 2.44 today up from 2.17, 2.16.  Troponin flat at 0.04  --neg nuc in 2014.  Carotid stenosis with hx R CEA 2017 on ASA and statin and plavix  --carotid dopplers followed by Dr. Myra Gianotti and Rt patent in 11/2016 and Lt with 60-79% stenosis   For questions or updates, please contact [facility] Please consult [email or web address] for contact info under Cardiology/STEMI.      Signed, Nada Boozer, NP  06/07/2017, 9:02 AM

## 2017-06-08 ENCOUNTER — Encounter (HOSPITAL_COMMUNITY): Payer: Self-pay | Admitting: General Surgery

## 2017-06-08 ENCOUNTER — Inpatient Hospital Stay (HOSPITAL_COMMUNITY): Payer: Medicare Other

## 2017-06-08 HISTORY — PX: IR THORACENTESIS ASP PLEURAL SPACE W/IMG GUIDE: IMG5380

## 2017-06-08 LAB — MAGNESIUM: MAGNESIUM: 2.3 mg/dL (ref 1.7–2.4)

## 2017-06-08 LAB — BASIC METABOLIC PANEL
Anion gap: 6 (ref 5–15)
BUN: 40 mg/dL — AB (ref 6–20)
CALCIUM: 7.7 mg/dL — AB (ref 8.9–10.3)
CHLORIDE: 106 mmol/L (ref 101–111)
CO2: 24 mmol/L (ref 22–32)
CREATININE: 2.57 mg/dL — AB (ref 0.44–1.00)
GFR calc non Af Amer: 16 mL/min — ABNORMAL LOW (ref >60)
GFR, EST AFRICAN AMERICAN: 19 mL/min — AB (ref >60)
Glucose, Bld: 98 mg/dL (ref 65–99)
Potassium: 4.5 mmol/L (ref 3.5–5.1)
SODIUM: 136 mmol/L (ref 135–145)

## 2017-06-08 LAB — PH, BODY FLUID: PH, BODY FLUID: 7.5

## 2017-06-08 LAB — ALBUMIN: ALBUMIN: 2.5 g/dL — AB (ref 3.5–5.0)

## 2017-06-08 LAB — BRAIN NATRIURETIC PEPTIDE: B NATRIURETIC PEPTIDE 5: 1420.4 pg/mL — AB (ref 0.0–100.0)

## 2017-06-08 MED ORDER — LIDOCAINE 2% (20 MG/ML) 5 ML SYRINGE
INTRAMUSCULAR | Status: AC
  Filled 2017-06-08: qty 10

## 2017-06-08 MED ORDER — LIDOCAINE HCL (PF) 1 % IJ SOLN
INTRAMUSCULAR | Status: DC | PRN
  Administered 2017-06-08: 8 mL

## 2017-06-08 MED ORDER — ALBUMIN HUMAN 25 % IV SOLN
12.5000 g | Freq: Once | INTRAVENOUS | Status: AC
  Administered 2017-06-08: 12.5 g via INTRAVENOUS
  Filled 2017-06-08: qty 50

## 2017-06-08 MED ORDER — SIMETHICONE 80 MG PO CHEW: 80.0000 mg | CHEWABLE_TABLET | Freq: Four times a day (QID) | ORAL | Status: DC | PRN

## 2017-06-08 NOTE — Progress Notes (Signed)
PT Cancellation Note  Patient Details Name: [name] MRN: [id number] DOB: November 02, 1930   Cancelled Treatment:    Reason Eval/Treat Not Completed: Patient at procedure or test/unavailable Pt off unit for X ray. PT will check on pt later as time allows.    Derek Mound, PTA Pager: [phone]   06/08/2017, 8:48 AM

## 2017-06-08 NOTE — Progress Notes (Signed)
Patient ID: [name], female   DOB: 1930-12-01, 83 y.o.   MRN: [id number]  PROGRESS NOTE    [name]  WUJ:[id number] DOB: 06-19-1931 DOA: 06/05/2017 PCP: Daisy Floro, MD   Brief Narrative:  83 year old female with past medical history of chronic diastolic CHF, left pleural effusion status post thoracentesis, CAD, carotid artery stenosis, anemia, CKD Stage IV presented with worsening shortness of breath. She was admitted with CHF exacerbation and started on intravenous Lasix.  Assessment & Plan:   Principal Problem:   Acute on chronic diastolic CHF (congestive heart failure) (HCC) Active Problems:   Hyperlipidemia LDL goal <100   Essential hypertension   PVD (peripheral vascular disease) with claudication (HCC)   Carotid artery stenosis   Pernicious anemia   Chronic renal insufficiency, stage 3 (moderate) (HCC)   Anemia in chronic kidney disease   Dyspnea on exertion   Pleural effusion   Acute on chronic diastolic CHF (congestive heart failure)  - cardiology following. Lasix held because of worsening renal function.  strict input and output. Daily weights. Negative balance of 1255 ml since admission - Echo showed EF of 70-75% with grade 1 diastolic dysfunction - continue aspirin and Coreg  Hypoxia probably secondary to above - Continue oxygen supplementation and wean off as needed - consider using morphine for refractory dyspnea  Recurrent Left pleural effusion - status post thoracentesis on 06/06/2017 and removal of 1.2 L fluid.  - Repeat chest x-ray including decubitus films from today shows bilateral free-flowing effusions. We will request for US guided thoracentesis today. Check albumin and give iv albumin if low. - if patient continues to have recurrent  Pleural effusion, might have to get cardiaothoracic surgery consultation for pleurodesis. - Palliative care consult for discussions of goals of care  Chronic kidney disease stage IV - creatinine  slightly worse today at 2.57. Monitor urine output. Lasix will be held. Repeat a.m. labs  Coronary artery disease And history of carotid artery stenosis -Continue aspirin and Plavix.  Essential hypertension. - continue Coreg and Lasix. Monitor blood pressure  Chronic anemia - Monitor hemoglobin. - outpatient follow-up  Generalized deconditioning - PT eval. care management consult  - patient's overall condition is gradually declining but very poor appetite.Palliative care will be consulted.   DVT prophylaxis: heparin Code Status:  full Family Communication: discussed with daughters at bedside Disposition Plan:Home in  1-3 days  Consultants: cardiology  Procedures: Echo on 06/06/2017 Study Conclusions  - Left ventricle: The cavity size was normal. There was severe   concentric hypertrophy. Systolic function was normal. The   estimated ejection fraction was in the range of 70% to 75%. Wall   motion was normal; there were no regional wall motion   abnormalities. Doppler parameters are consistent with abnormal   left ventricular relaxation (grade 1 diastolic dysfunction).   Doppler parameters are consistent with elevated ventricular   end-diastolic filling pressure. - Aortic valve: Trileaflet; mildly thickened, mildly calcified   leaflets. There was no regurgitation. - Mitral valve: Calcified annulus. Mildly thickened leaflets .   There was mild regurgitation. - Left atrium: The atrium was normal in size. - Right ventricle: Systolic function was normal. - Tricuspid valve: There was trivial regurgitation. - Inferior vena cava: The vessel was normal in size. - Pericardium, extracardiac: A mild pericardial effusion was   identified anterior to the heart. Features were not consistent   with tamponade physiology. There was a large left pleural   effusion.  Impressions:  - Severe LVH,  possibly hypertrophic cardiomyopathy. Hyperdynamic   LVEF, mid cavitary and LVOT  gradient. Elevated LVEDP.   Antimicrobials: none    Subjective: Patient seen and examined at bedside. She feels slightly better but still complains of exertional shortness of breath along with pain in the lower part of her bilateral chest. No overnight fever, nausea or vomiting. No diarrhea.  Objective: Vitals:   06/07/17 1423 06/07/17 1834 06/07/17 2000 06/08/17 0500  BP: (!) 139/54 126/73 125/62 119/80  Pulse: 61 61 (!) 59 62  Resp:   16 16  Temp: 97.7 F (36.5 C)  98.3 F (36.8 C) 98.2 F (36.8 C)  TempSrc: Oral  Oral Oral  SpO2: 93%  96% 97%  Weight:   56.2 kg (123 lb 14.4 oz) 56.2 kg (123 lb 14.4 oz)  Height:        Intake/Output Summary (Last 24 hours) at 06/08/17 1155 Last data filed at 06/08/17 0600  Gross per 24 hour  Intake              580 ml  Output             1015 ml  Net             -435 ml   Filed Weights   06/07/17 0550 06/07/17 2000 06/08/17 0500  Weight: 56.1 kg (123 lb 11.2 oz) 56.2 kg (123 lb 14.4 oz) 56.2 kg (123 lb 14.4 oz)    Examination:  General exam: elderly female lying in bed in no acute distress Respiratory system: Bilateral decreased breath sound at bases with basilar crackles Cardiovascular system: S1 & S2 heard, intermittent bradycardia Gastrointestinal system: Abdomen is nondistended, soft and nontender. Normal bowel sounds heard. Extremities: No cyanosis, clubbing; trace edema    Data Reviewed: I have personally reviewed following labs and imaging studies  CBC:  Recent Labs Lab 06/05/17 1049 06/06/17 0718 06/07/17 0540  WBC 7.8 6.2 8.0  NEUTROABS  --  4.6 6.6  HGB 12.2 10.1* 10.6*  HCT 37.8 32.2* 33.7*  MCV 86.5 87.0 87.5  PLT 256 194 204   Basic Metabolic Panel:  Recent Labs Lab 06/05/17 1049 06/05/17 1917 06/06/17 0718 06/07/17 0540 06/08/17 0516  NA 138 136 139 138 136  K 4.2 4.0 4.1 4.3 4.5  CL 107 106 109 106 106  CO2 22 20* 21* 23 24  GLUCOSE 127* 101* 80 93 98  BUN 30* 29* 27* 30* 40*  CREATININE  2.22* 2.16* 2.17* 2.44* 2.57*  CALCIUM 8.6* 7.9* 7.9* 8.1* 7.7*  MG  --  2.1  --  2.2 2.3   GFR: Estimated Creatinine Clearance: 14.5 mL/min (A) (by C-G formula based on SCr of 2.57 mg/dL (H)). Liver Function Tests: No results for input(s): AST, ALT, ALKPHOS, BILITOT, PROT, ALBUMIN in the last 168 hours. No results for input(s): LIPASE, AMYLASE in the last 168 hours. No results for input(s): AMMONIA in the last 168 hours. Coagulation Profile: No results for input(s): INR, PROTIME in the last 168 hours. Cardiac Enzymes:  Recent Labs Lab 06/05/17 1049 06/05/17 1917 06/06/17 0014 06/06/17 0718  TROPONINI 0.04* 0.04* 0.04* 0.04*   BNP (last 3 results)  Recent Labs  12/31/16 1025 01/22/17 1346 02/01/17 1453  PROBNP 11,403* 11,385* 11,969*   HbA1C: No results for input(s): HGBA1C in the last 72 hours. CBG: No results for input(s): GLUCAP in the last 168 hours. Lipid Profile: No results for input(s): CHOL, HDL, LDLCALC, TRIG, CHOLHDL, LDLDIRECT in the last 72 hours. Thyroid Function Tests:  Recent Labs  06/05/17 1917  TSH 4.141   Anemia Panel: No results for input(s): VITAMINB12, FOLATE, FERRITIN, TIBC, IRON, RETICCTPCT in the last 72 hours. Sepsis Labs: No results for input(s): PROCALCITON, LATICACIDVEN in the last 168 hours.  Recent Results (from the past 240 hour(s))  Culture, body fluid-bottle     Status: None (Preliminary result)   Collection Time: 06/06/17  1:57 PM  Result Value Ref Range Status   Specimen Description FLUID LEFT PLEURAL  Final   Special Requests NONE  Final   Culture NO GROWTH < 24 HOURS  Final   Report Status PENDING  Incomplete  Gram stain     Status: None   Collection Time: 06/06/17  1:57 PM  Result Value Ref Range Status   Specimen Description FLUID LEFT PLEURAL  Final   Special Requests NONE  Final   Gram Stain   Final    FEW WBC PRESENT,BOTH PMN AND MONONUCLEAR NO ORGANISMS SEEN    Report Status 06/06/2017 FINAL  Final          Radiology Studies: Dg Chest 1 View  Result Date: 06/06/2017 CLINICAL DATA:  S/p left thoracentesis EXAM: CHEST 1 VIEW COMPARISON:  06/05/2017 FINDINGS: Reduction in LEFT pleural fluid following thoracentesis. No LEFT pneumothorax appreciated. No RIGHT pneumothorax. Lungs are hyperinflated. Cardiac silhouette is enlarged. IMPRESSION: No pneumothorax appreciated following LEFT thoracentesis. Reduction in LEFT pleural fluid. Electronically Signed   By: Genevive Bi M.D.   On: 06/06/2017 14:32   Dg Chest 2 View  Result Date: 06/08/2017 CLINICAL DATA:  Shortness of breath, history of pleural effusion EXAM: CHEST  2 VIEW COMPARISON:  Chest x-ray of 06/07/2017 FINDINGS: There are small bilateral pleural effusions present with bibasilar linear atelectasis. No definite pneumonia seen although pneumonia at the lung bases would be difficult to exclude. Mediastinal and hilar contours are unremarkable. The heart is mildly enlarged and stable. Moderate changes of thoracic aortic atherosclerosis are noted. The bones are somewhat osteopenic. IMPRESSION: 1. Small bilateral pleural effusions with bibasilar linear atelectasis. 2. Stable cardiomegaly. 3. Moderate thoracic aortic atherosclerosis. Electronically Signed   By: Dwyane Dee M.D.   On: 06/08/2017 08:52   Dg Chest 2 View  Result Date: 06/07/2017 CLINICAL DATA:  Shortness of breath. EXAM: CHEST  2 VIEW COMPARISON:  Chest x-ray from yesterday. FINDINGS: Stable cardiomegaly. Normal pulmonary vascularity. Atherosclerotic calcification of the aortic arch. New small right pleural effusion. Slightly increased trace left pleural effusion. Bibasilar atelectasis. No pneumothorax. No acute osseous abnormality. IMPRESSION: New small right pleural effusion and slight interval increase in size of trace left pleural effusion. Electronically Signed   By: Obie Dredge M.D.   On: 06/07/2017 09:22   Dg Chest Bilateral Decubitus  Result Date:  06/08/2017 CLINICAL DATA:  Pleural effusion EXAM: CHEST - BILATERAL DECUBITUS VIEW COMPARISON:  06/08/2017 FINDINGS: There are small to moderate bilateral pleural effusions, free flowing on the left. This appears partially loculated laterally on the right. No change since prior study. IMPRESSION: Free flowing small to moderate left effusion with partially loculated right effusion. No change. Electronically Signed   By: Charlett Nose M.D.   On: 06/08/2017 11:22   Dg Chest Bilateral Decubitus  Result Date: 06/08/2017 CLINICAL DATA:  Shortness of breath, history of pleural effusion EXAM: CHEST - BILATERAL DECUBITUS VIEW COMPARISON:  Chest x-ray of 06/07/2017 FINDINGS: Right decubitus and left lateral decubitus films of the chest were obtained. The left pleural effusion appears free-flowing and slightly larger than the right. The right pleural  effusion also is free-flowing. No definite pneumonia is seen. IMPRESSION: Bilateral free-flowing pleural effusions left slightly larger right. Electronically Signed   By: Dwyane Dee M.D.   On: 06/08/2017 08:53   US Thoracentesis Asp Pleural Space W/img Guide  Result Date: 06/06/2017 INDICATION: Patient with history of CHF, left pleural effusion. Request is made for diagnostic and therapeutic left thoracentesis. EXAM: ULTRASOUND GUIDED DIAGNOSTIC AND THERAPEUTIC LEFT THORACENTESIS MEDICATIONS: 10 mL 1% lidocaine COMPLICATIONS: None immediate. PROCEDURE: An ultrasound guided thoracentesis was thoroughly discussed with the patient and questions answered. The benefits, risks, alternatives and complications were also discussed. The patient understands and wishes to proceed with the procedure. Written consent was obtained. Ultrasound was performed to localize and mark an adequate pocket of fluid in the left chest. The area was then prepped and draped in the normal sterile fashion. 1% Lidocaine was used for local anesthesia. Under ultrasound guidance a Safe-T-Centesis catheter  was introduced. Thoracentesis was performed. The catheter was removed and a dressing applied. FINDINGS: A total of approximately 1.2 liters of clear, yellow fluid was removed. Samples were sent to the laboratory as requested by the clinical team. IMPRESSION: Successful ultrasound guided diagnostic and therapeutic left thoracentesis yielding 1.2 liters of pleural fluid. Read by:  Loyce Dys PA-C Electronically Signed   By: Irish Lack M.D.   On: 06/06/2017 14:23        Scheduled Meds: . amLODipine  5 mg Oral BID  . aspirin EC  81 mg Oral Daily  . atorvastatin  20 mg Oral Daily  . carvedilol  12.5 mg Oral QHS  . carvedilol  6.25 mg Oral Once  . clopidogrel  75 mg Oral Q breakfast  . fluticasone  1 spray Each Nare Daily  . heparin  5,000 Units Subcutaneous Q8H  . hydrALAZINE  50 mg Oral TID  . sodium chloride flush  3 mL Intravenous Q12H   Continuous Infusions: . sodium chloride       LOS: 3 days        Glade Lloyd, MD [facility] Pager [phone]  If 7PM-7AM, please contact night-coverage [email or web address] Password [email or web address] 06/08/2017, 11:55 AM

## 2017-06-08 NOTE — Progress Notes (Signed)
Physical Therapy Treatment Patient Details Name: [name] MRN: [id number] DOB: 18-Sep-1930 Today's Date: 06/08/2017    History of Present Illness [name] is a 83 y.o. female with Past medical history of Chronic diastolic CHF, left pleural effusion, CAD, carotid artery stenosis, anemia, CKD stage III-IV. REports shortness of breath over past month    PT Comments    Patient is progressing toward mobility goals. Pt required 2L O2 via [place] to maintain SpO2 90% with mobility. Pt will benefit from use of rollator and further skilled PT services to maximize independence and safety with mobility.    Follow Up Recommendations  Home health PT     Equipment Recommendations  Other (comment) (supplemental O2; rollator)    Recommendations for Other Services       Precautions / Restrictions Precautions Precautions: Fall Restrictions Weight Bearing Restrictions: No    Mobility  Bed Mobility Overal bed mobility: Independent                Transfers Overall transfer level: Needs assistance Equipment used: None Transfers: Sit to/from Stand Sit to Stand: Supervision         General transfer comment: supervision for safety  Ambulation/Gait Ambulation/Gait assistance: Min guard;Supervision Ambulation Distance (Feet): 280 Feet Assistive device:  (rollator) Gait Pattern/deviations: Step-through pattern;Decreased stride length     General Gait Details: cues needed for pursed lip breathing technique; 2 standing rest breaks required; slow, steady gait; min guard at times with horizontal head turns and directional changes but no physical assist needed and no LOB   Stairs            Wheelchair Mobility    Modified Rankin (Stroke Patients Only)       Balance Overall balance assessment: Needs assistance   Sitting balance-Leahy Scale: Good       Standing balance-Leahy Scale: Fair Standing balance comment: pt is able to static stand without UE support                             Cognition Arousal/Alertness: Awake/alert Behavior During Therapy: WFL for tasks assessed/performed Overall Cognitive Status: Within Functional Limits for tasks assessed                                        Exercises      General Comments General comments (skin integrity, edema, etc.): SpO2 desat to 84% on RA      Pertinent Vitals/Pain Pain Assessment: No/denies pain    Home Living                      Prior Function            PT Goals (current goals can now be found in the care plan section) Acute Rehab PT Goals Patient Stated Goal: go home PT Goal Formulation: With patient Time For Goal Achievement: 06/20/17 Potential to Achieve Goals: Good Progress towards PT goals: Progressing toward goals    Frequency    Min 3X/week      PT Plan Current plan remains appropriate    Co-evaluation              AM-PAC PT "6 Clicks" Daily Activity  Outcome Measure  Difficulty turning over in bed (including adjusting bedclothes, sheets and blankets)?: None Difficulty moving from lying on back to sitting on the  side of the bed? : A Little Difficulty sitting down on and standing up from a chair with arms (e.g., wheelchair, bedside commode, etc,.)?: A Little Help needed moving to and from a bed to chair (including a wheelchair)?: None Help needed walking in hospital room?: A Little Help needed climbing 3-5 steps with a railing? : A Little 6 Click Score: 20    End of Session Equipment Utilized During Treatment: Gait belt;Oxygen Activity Tolerance: Patient tolerated treatment well Patient left: in chair;with call bell/phone within reach;with family/visitor present Nurse Communication: Mobility status PT Visit Diagnosis: Unsteadiness on feet (R26.81);Muscle weakness (generalized) (M62.81)     Time: 1610-9604 PT Time Calculation (min) (ACUTE ONLY): 21 min  Charges:  $Gait Training: 8-22 mins                     G Codes:       Erline Levine, PTA Pager: [phone]     Carolynne Edouard 06/08/2017, 12:24 PM

## 2017-06-08 NOTE — Procedures (Signed)
Ultrasound-guided therapeutic left thoracentesis performed yielding 0.75 liters of serous colored fluid. No immediate complications. Follow-up chest x-ray pending.       Jael Kostick E 4:23 PM 06/08/2017

## 2017-06-08 NOTE — Progress Notes (Signed)
SATURATION QUALIFICATIONS: (This note is used to comply with regulatory documentation for home oxygen)  Patient Saturations on Room Air at Rest = 90%  Patient Saturations on Room Air while Ambulating = 84%  Patient Saturations on 2 Liters of oxygen while Ambulating =90%  Please briefly explain why patient needs home oxygen:Patient is unable to maintain oxygen sats on RA and required supplemental O2 via [place] to maintain SpO2 90% with mobility.   Erline Levine, PTA Pager: [phone]

## 2017-06-08 NOTE — Progress Notes (Signed)
Progress Note  Patient Name: [name] Date of Encounter: 06/08/2017  Primary Cardiologist: Dr. Tenny Craw  Subjective   Still SOB when up walking to the bathroom  Inpatient Medications    Scheduled Meds: . amLODipine  5 mg Oral BID  . aspirin EC  81 mg Oral Daily  . atorvastatin  20 mg Oral Daily  . carvedilol  12.5 mg Oral QHS  . carvedilol  6.25 mg Oral Once  . clopidogrel  75 mg Oral Q breakfast  . fluticasone  1 spray Each Nare Daily  . heparin  5,000 Units Subcutaneous Q8H  . hydrALAZINE  50 mg Oral TID  . sodium chloride flush  3 mL Intravenous Q12H   Continuous Infusions: . sodium chloride     PRN Meds: sodium chloride, acetaminophen, hydrALAZINE, ondansetron (ZOFRAN) IV, sodium chloride flush   Vital Signs    Vitals:   06/07/17 1423 06/07/17 1834 06/07/17 2000 06/08/17 0500  BP: (!) 139/54 126/73 125/62 119/80  Pulse: 61 61 (!) 59 62  Resp:   16 16  Temp: 97.7 F (36.5 C)  98.3 F (36.8 C) 98.2 F (36.8 C)  TempSrc: Oral  Oral Oral  SpO2: 93%  96% 97%  Weight:   123 lb 14.4 oz (56.2 kg) 123 lb 14.4 oz (56.2 kg)  Height:        Intake/Output Summary (Last 24 hours) at 06/08/17 0947 Last data filed at 06/08/17 0600  Gross per 24 hour  Intake              580 ml  Output             1015 ml  Net             -435 ml   Filed Weights   06/07/17 0550 06/07/17 2000 06/08/17 0500  Weight: 123 lb 11.2 oz (56.1 kg) 123 lb 14.4 oz (56.2 kg) 123 lb 14.4 oz (56.2 kg)    Telemetry    NSR - Personally Reviewed  ECG    No new EKG - Personally Reviewed  Physical Exam   GEN: No acute distress.   Neck: No JVD Cardiac: RRR, no murmurs, rubs, or gallops.  Respiratory: Cslight decreased BS at bases. GI: Soft, nontender, non-distended  MS: No edema; No deformity. Neuro:  Nonfocal  Psych: Normal affect   Labs    Chemistry Recent Labs Lab 06/06/17 0718 06/07/17 0540 06/08/17 0516  NA 139 138 136  K 4.1 4.3 4.5  CL 109 106 106  CO2 21* 23 24    GLUCOSE 80 93 98  BUN 27* 30* 40*  CREATININE 2.17* 2.44* 2.57*  CALCIUM 7.9* 8.1* 7.7*  GFRNONAA 20* 17* 16*  GFRAA 23* 20* 19*  ANIONGAP Hematology Recent Labs Lab 06/05/17 1049 06/06/17 0718 06/07/17 0540  WBC 7.8 6.2 8.0  RBC 4.37 3.70* 3.85*  HGB 12.2 10.1* 10.6*  HCT 37.8 32.2* 33.7*  MCV 86.5 87.0 87.5  MCH 27.9 27.3 27.5  MCHC 32.3 31.4 31.5  RDW 15.7* 15.9* 15.9*  PLT 256 194 204    Cardiac Enzymes Recent Labs Lab 06/05/17 1049 06/05/17 1917 06/06/17 0014 06/06/17 0718  TROPONINI 0.04* 0.04* 0.04* 0.04*   No results for input(s): TROPIPOC in the last 168 hours.   BNP Recent Labs Lab 06/05/17 1917  BNP 1,485.2*     DDimer No results for input(s): DDIMER in the last 168 hours.   Radiology  Dg Chest 1 View  Result Date: 06/06/2017 CLINICAL DATA:  S/p left thoracentesis EXAM: CHEST 1 VIEW COMPARISON:  06/05/2017 FINDINGS: Reduction in LEFT pleural fluid following thoracentesis. No LEFT pneumothorax appreciated. No RIGHT pneumothorax. Lungs are hyperinflated. Cardiac silhouette is enlarged. IMPRESSION: No pneumothorax appreciated following LEFT thoracentesis. Reduction in LEFT pleural fluid. Electronically Signed   By: Genevive Bi M.D.   On: 06/06/2017 14:32   Dg Chest 2 View  Result Date: 06/08/2017 CLINICAL DATA:  Shortness of breath, history of pleural effusion EXAM: CHEST  2 VIEW COMPARISON:  Chest x-ray of 06/07/2017 FINDINGS: There are small bilateral pleural effusions present with bibasilar linear atelectasis. No definite pneumonia seen although pneumonia at the lung bases would be difficult to exclude. Mediastinal and hilar contours are unremarkable. The heart is mildly enlarged and stable. Moderate changes of thoracic aortic atherosclerosis are noted. The bones are somewhat osteopenic. IMPRESSION: 1. Small bilateral pleural effusions with bibasilar linear atelectasis. 2. Stable cardiomegaly. 3. Moderate thoracic aortic  atherosclerosis. Electronically Signed   By: Dwyane Dee M.D.   On: 06/08/2017 08:52   Dg Chest 2 View  Result Date: 06/07/2017 CLINICAL DATA:  Shortness of breath. EXAM: CHEST  2 VIEW COMPARISON:  Chest x-ray from yesterday. FINDINGS: Stable cardiomegaly. Normal pulmonary vascularity. Atherosclerotic calcification of the aortic arch. New small right pleural effusion. Slightly increased trace left pleural effusion. Bibasilar atelectasis. No pneumothorax. No acute osseous abnormality. IMPRESSION: New small right pleural effusion and slight interval increase in size of trace left pleural effusion. Electronically Signed   By: Obie Dredge M.D.   On: 06/07/2017 09:22   Dg Chest Bilateral Decubitus  Result Date: 06/08/2017 CLINICAL DATA:  Shortness of breath, history of pleural effusion EXAM: CHEST - BILATERAL DECUBITUS VIEW COMPARISON:  Chest x-ray of 06/07/2017 FINDINGS: Right decubitus and left lateral decubitus films of the chest were obtained. The left pleural effusion appears free-flowing and slightly larger than the right. The right pleural effusion also is free-flowing. No definite pneumonia is seen. IMPRESSION: Bilateral free-flowing pleural effusions left slightly larger right. Electronically Signed   By: Dwyane Dee M.D.   On: 06/08/2017 08:53   US Thoracentesis Asp Pleural Space W/img Guide  Result Date: 06/06/2017 INDICATION: Patient with history of CHF, left pleural effusion. Request is made for diagnostic and therapeutic left thoracentesis. EXAM: ULTRASOUND GUIDED DIAGNOSTIC AND THERAPEUTIC LEFT THORACENTESIS MEDICATIONS: 10 mL 1% lidocaine COMPLICATIONS: None immediate. PROCEDURE: An ultrasound guided thoracentesis was thoroughly discussed with the patient and questions answered. The benefits, risks, alternatives and complications were also discussed. The patient understands and wishes to proceed with the procedure. Written consent was obtained. Ultrasound was performed to localize and  mark an adequate pocket of fluid in the left chest. The area was then prepped and draped in the normal sterile fashion. 1% Lidocaine was used for local anesthesia. Under ultrasound guidance a Safe-T-Centesis catheter was introduced. Thoracentesis was performed. The catheter was removed and a dressing applied. FINDINGS: A total of approximately 1.2 liters of clear, yellow fluid was removed. Samples were sent to the laboratory as requested by the clinical team. IMPRESSION: Successful ultrasound guided diagnostic and therapeutic left thoracentesis yielding 1.2 liters of pleural fluid. Read by:  Loyce Dys PA-C Electronically Signed   By: Irish Lack M.D.   On: 06/06/2017 14:23    Cardiac Studies   Echo 06/06/17 Study Conclusions  - Left ventricle: The cavity size was normal. There was severe concentric hypertrophy. Systolic function was normal. The estimated ejection fraction was  in the range of 70% to 75%. Wall motion was normal; there were no regional wall motion abnormalities. Doppler parameters are consistent with abnormal left ventricular relaxation (grade 1 diastolic dysfunction). Doppler parameters are consistent with elevated ventricular end-diastolic filling pressure. - Aortic valve: Trileaflet; mildly thickened, mildly calcified leaflets. There was no regurgitation. - Mitral valve: Calcified annulus. Mildly thickened leaflets . There was mild regurgitation. - Left atrium: The atrium was normal in size. - Right ventricle: Systolic function was normal. - Tricuspid valve: There was trivial regurgitation. - Inferior vena cava: The vessel was normal in size. - Pericardium, extracardiac: A mild pericardial effusion was identified anterior to the heart. Features were not consistent with tamponade physiology. There was a large left pleural effusion.  Impressions:  - Severe LVH, possibly hypertrophic cardiomyopathy. Hyperdynamic LVEF, mid cavitary  and LVOT gradient. Elevated LVEDP.  Patient Profile     83 y.o. female with past medical history of carotid artery disease (s/p R CEA in 10/2015), chronic diastolic CHF, Stage 3 CKD, recurrent pleural effusions (s/p prior thoracentesis), HTN, and HLD admitted 06/05/17 for CHF with increased dyspnea.    Assessment & Plan    Acute on Chronic diastolic CHF --BNP on admit 1485  --neg 1.2L since admit and wt down from 126 to 123 lbs - weight has been stable for 3 days.  Dry weight is around 127lbs based on prior office notes.   --Lasix on hold secondary to worsening renal function - will continue to hold for now.  Creatinine increase likely due to intravascular volume depletion from diuretics used for pleural effusion.  Her weight is down 4lbs from her dry weight.   Would allow her to equilibrate and only use Lasix PRN for weight above 127lbs.   - discussed with Dr. Tenny Craw who is her primary Cardiologist.  Unclear why she continues to get pleural effusions but would avoid Lasix unless overall she appears volume overloaded as she quickly gets worsening renal function.  Would continue with PRN thoracentesis for pleural effusions and only use Lasix if weight is up and she has other signs of volume overload.   Bilateral pl effusions  --thoracentesis 10/14 of Lt, 1.2 L clear yellow fluid- post CXR with reduction of fluid.  Today CXR with small bilateral effusions.  Severe LVH possible hypertrophic cardiomyopathy with hyperdynamic LVEF Mid cavitary and LVOT gradient   SEM -hx aortic sclerosis without stenosis   HTN --BP stable today at 119/48mmHg --amlodipine 5, coreg 12.5 bid, hydralazine 50 mg TID   CKD- 4 --Cr 2.57 today (2.16>2.17>2.44>2.57)  Troponin flat at 0.04 x 3 --neg nuc in 2014.  Carotid stenosis with hx R CEA 2017 on ASA and statin and plavix  --carotid dopplers followed by Dr. Myra Gianotti and Rt patent in 11/2016 and Lt with 60-79% stenosis  Would only use Lasix on a PRN basis  for increased weight above 127lbs and evidence of general volume overload and not use for treatment of her isolated pleural effusions.  Would continue PRN thoracentesis for her effusions.  I am going to get bilateral decubitus films today to determine degree of residual effusions since she is still SOB  For questions or updates, please contact [facility] Please consult [email or web address] for contact info under Cardiology/STEMI.      Signed, Armanda Magic, MD  06/08/2017, 9:47 AM

## 2017-06-08 NOTE — Progress Notes (Signed)
Pt  Ambulated in a hallway with RN, oxygen saturation was 90% in RA, Slept well overnight, vitals stable, no any complain of CP and SOB, will continue to monitor  Lonia Farber, RN

## 2017-06-08 NOTE — Plan of Care (Signed)
Problem: Fluid Volume: Goal: Ability to maintain a balanced intake and output will improve Outcome: Progressing Decreased output Lasix on hold with Fluid on Lungs.

## 2017-06-09 ENCOUNTER — Other Ambulatory Visit: Payer: Medicare Other

## 2017-06-09 ENCOUNTER — Ambulatory Visit: Payer: Medicare Other | Admitting: Hematology & Oncology

## 2017-06-09 ENCOUNTER — Ambulatory Visit: Payer: Medicare Other

## 2017-06-09 DIAGNOSIS — Z7189 Other specified counseling: Secondary | ICD-10-CM

## 2017-06-09 DIAGNOSIS — R0602 Shortness of breath: Secondary | ICD-10-CM

## 2017-06-09 DIAGNOSIS — Z515 Encounter for palliative care: Secondary | ICD-10-CM

## 2017-06-09 DIAGNOSIS — J90 Pleural effusion, not elsewhere classified: Secondary | ICD-10-CM

## 2017-06-09 DIAGNOSIS — I509 Heart failure, unspecified: Secondary | ICD-10-CM

## 2017-06-09 LAB — MAGNESIUM: MAGNESIUM: 2.1 mg/dL (ref 1.7–2.4)

## 2017-06-09 LAB — CBC WITH DIFFERENTIAL/PLATELET
BASOS ABS: 0.1 10*3/uL (ref 0.0–0.1)
Basophils Relative: 1 %
Eosinophils Absolute: 0.2 10*3/uL (ref 0.0–0.7)
Eosinophils Relative: 3 %
HEMATOCRIT: 27.9 % — AB (ref 36.0–46.0)
Hemoglobin: 8.8 g/dL — ABNORMAL LOW (ref 12.0–15.0)
LYMPHS PCT: 10 %
Lymphs Abs: 0.7 10*3/uL (ref 0.7–4.0)
MCH: 27.3 pg (ref 26.0–34.0)
MCHC: 31.5 g/dL (ref 30.0–36.0)
MCV: 86.6 fL (ref 78.0–100.0)
Monocytes Absolute: 0.6 10*3/uL (ref 0.1–1.0)
Monocytes Relative: 9 %
NEUTROS ABS: 5 10*3/uL (ref 1.7–7.7)
Neutrophils Relative %: 77 %
PLATELETS: 167 10*3/uL (ref 150–400)
RBC: 3.22 MIL/uL — ABNORMAL LOW (ref 3.87–5.11)
RDW: 15.6 % — ABNORMAL HIGH (ref 11.5–15.5)
WBC: 6.5 10*3/uL (ref 4.0–10.5)

## 2017-06-09 LAB — COMPREHENSIVE METABOLIC PANEL
ALBUMIN: 2.4 g/dL — AB (ref 3.5–5.0)
ALT: 13 U/L — AB (ref 14–54)
AST: 20 U/L (ref 15–41)
Alkaline Phosphatase: 67 U/L (ref 38–126)
Anion gap: 8 (ref 5–15)
BUN: 49 mg/dL — ABNORMAL HIGH (ref 6–20)
CHLORIDE: 105 mmol/L (ref 101–111)
CO2: 23 mmol/L (ref 22–32)
CREATININE: 2.79 mg/dL — AB (ref 0.44–1.00)
Calcium: 7.7 mg/dL — ABNORMAL LOW (ref 8.9–10.3)
GFR calc Af Amer: 17 mL/min — ABNORMAL LOW (ref >60)
GFR, EST NON AFRICAN AMERICAN: 15 mL/min — AB (ref >60)
GLUCOSE: 101 mg/dL — AB (ref 65–99)
Potassium: 4.8 mmol/L (ref 3.5–5.1)
Sodium: 136 mmol/L (ref 135–145)
Total Bilirubin: 0.4 mg/dL (ref 0.3–1.2)
Total Protein: 4.8 g/dL — ABNORMAL LOW (ref 6.5–8.1)

## 2017-06-09 MED ORDER — MORPHINE SULFATE (CONCENTRATE) 10 MG/0.5ML PO SOLN
5.0000 mg | ORAL | Status: DC | PRN
  Administered 2017-06-09 (×2): 5 mg via ORAL
  Filled 2017-06-09 (×2): qty 0.5

## 2017-06-09 MED ORDER — ALBUMIN HUMAN 25 % IV SOLN
25.0000 g | Freq: Once | INTRAVENOUS | Status: AC
  Administered 2017-06-09: 25 g via INTRAVENOUS
  Filled 2017-06-09: qty 100

## 2017-06-09 MED ORDER — ENSURE ENLIVE PO LIQD
237.0000 mL | Freq: Three times a day (TID) | ORAL | Status: DC
Start: 2017-06-09 — End: 2017-06-11
  Administered 2017-06-09 – 2017-06-11 (×7): 237 mL via ORAL

## 2017-06-09 MED ORDER — NAPHAZOLINE-GLYCERIN 0.012-0.2 % OP SOLN
1.0000 [drp] | Freq: Four times a day (QID) | OPHTHALMIC | Status: DC | PRN
  Administered 2017-06-10: 1 [drp] via OPHTHALMIC
  Administered 2017-06-10: 2 [drp] via OPHTHALMIC
  Filled 2017-06-09 (×2): qty 15

## 2017-06-09 NOTE — Consult Note (Signed)
Consultation Note Date: 06/09/2017   Patient Name: [name]  DOB: 01/26/1931  MRN: [id number]  Age / Sex: 83 y.o., female  PCP: Daisy Floro, MD Referring Physician: Alba Cory, MD  Reason for Consultation: Establishing goals of care  HPI/Patient Profile: 83 y.o. female     admitted on 06/05/2017    83 y.o. female with past medical history of carotid artery disease (s/p R CEA in 10/2015), chronic diastolic CHF, Stage 3 CKD, recurrent pleural effusions (s/p prior thoracentesis), HTN, and HLD admitted 06/05/17 for CHF with increased dyspnea.   Clinical Assessment and Goals of Care:  83 year old lady with past medical history as listed above lives at home with daughter in [place], [place]. It is noted that for the past month or so the patient has had increasing shortness of breath, worsening appetite, gradual progressive functional status decline. The patient has had shortness of breath even at rest. The patient has been sleeping propped up on pillows, eventually started sleeping in a recliner at home. The patient was on Lasix but continued to have shortness of breath which prompted this hospitalization. The patient has undergone thoracenteses twice since this hospitalization. Additional workup with echocardiography has revealed severe left ventricular hypertrophy, possible hypertrophic cardiomyopathy. Cardiology is following. Patient is on ongoing diuretic adjustment, consideration is being given for possible IV albumin infusion, however, hospital course has been complicated by worsening kidney function as well. Patient has underlying stage III chronic kidney disease.  A palliative consultation has been requested for symptom management and goals of care discussions.  Patient is an elderly lady sitting up in chair. She is eating her lunch. Her 2 daughters are present at the bedside.  She lives with her daughter [name] in [place], [place]. Patient states that she is in the hospital because she had fluid around her lungs. She does believe that the thoracenteses has helped her.  We discussed about the serious nature of her chronic irreversible conditions, diastolic congestive heart failure left ventricular hypertrophy, acute kidney injury on top of her stage III chronic kidney disease. We discussed about goals wishes and values. CODE STATUS discussions also undertaken in detail.  We discussed that this was a preparedness in the face of serious illness meeting. Very briefly, hospice philosophy of care was also discussed.  All of the patient's and family's questions answered to the best of my ability. See recommendations below. Thank you for the consult.  NEXT OF KIN  has 6 kids, 2 daughters who live locally are most involved with the patient's care, lives with one daughter in [place], [place].   SUMMARY OF RECOMMENDATIONS    initial family meeting: Appropriate symptom management options discussed. Will add low-dose Roxanol and monitor kidney function. Patient does have baseline shortness of breath.  Full code for now. Patient and family to continue discussions about DO NOT RESUSCITATE/DO NOT INTUBATE.  Completion of advance directives. Patient does not have a living will, does not have healthcare power of attorney agent picked out. She  believes she would want Heidi to make medical decisions on her behalf. I have given her information with regards to completion of advance directives through chaplaincy services here in the hospital. Patient wishes to think about this further.   Code Status/Advance Care Planning:  Full code    Symptom Management:    as above   Palliative Prophylaxis:   Delirium Protocol  Psycho-social/Spiritual:   Desire for further Chaplaincy support:yes  Additional Recommendations: Caregiving  Support/Resources  Prognosis:   Unable to  determine Does appear to be guarded given serious chronic illnesses, ongoing decline.  Discharge Planning: To Be Determined      Primary Diagnoses: Present on Admission: . Acute on chronic diastolic CHF (congestive heart failure) (HCC) . Anemia in chronic kidney disease . Pernicious anemia . PVD (peripheral vascular disease) with claudication (HCC) . Essential hypertension . Chronic renal insufficiency, stage 3 (moderate) (HCC) . Hyperlipidemia LDL goal <100 . Carotid artery stenosis   I have reviewed the medical record, interviewed the patient and family, and examined the patient. The following aspects are pertinent.  Past Medical History:  Diagnosis Date  . Anemia    a. "On/off my whole life" - etiology unclear. Has never required blood transfusion.  . Anemia associated with stage 3 chronic renal failure (HCC) 06/02/2016  . Arthritis   . CAD (coronary artery disease)   . Carotid artery occlusion    RICA 80-99%, LICA 40-59% by dopplers 06/2013. Also with cerebrovascular disease otherwise on MRA 06/2013.  . Carotid artery stenosis   . Chronic kidney disease   . Chronic renal insufficiency, stage 3 (moderate) (HCC) 06/02/2016  . Depression   . Dizziness    a. Adm 06/2013: felt due to posterior circulation deficits from vertebrobasilar insufficiency and possibly vertigo.  Marland Kitchen DVT (deep venous thrombosis) (HCC)    a. Around age 61, details unclear (SVT vs DVT?), s/p vein stripping without recurrence.  . Erythropoietin deficiency anemia 06/02/2016  . Heart murmur    a. Longstanding. mild MR on prior ECG, also may be in setting of known vasc dz.  . Hiatal hernia   . Hyperlipidemia   . Hypertension    a. Intolerant to Maxzide due to hyponatremia. b. intolerant to Clonidine due to slow HR/fatigue. c. Discrepancy in BP felt due to innominant stenosis.  . Iron deficiency anemia due to chronic blood loss 09/08/2016  . Iron malabsorption 09/08/2016  . Macular degeneration    In both  eyes - no longer drives.  . Macular degeneration    L- macular degeneration, R eye impaired as well  . Multiple thyroid nodules    a. By [place] 06/2013. Bx recommended.  . Pernicious anemia 06/02/2016  . PFO (patent foramen ovale)    a. Per echo in 12/2010.  Marland Kitchen PVD (peripheral vascular disease) (HCC)    a. R innominant stenosis. b. 06/2013 ABI: 0.80 R, 0.50 L., DVT- 1970's  . Varicose veins    Social History   Social History  . Marital status: Widowed    Spouse name: N/A  . Number of children: 6  . Years of education: 8th   Occupational History  . retired Retired   Social History Main Topics  . Smoking status: Former Smoker    Quit date: 08/24/2009  . Smokeless tobacco: Never Used  . Alcohol use No  . Drug use: No  . Sexual activity: No   Other Topics Concern  . None   Social History Narrative  . None   Family History  Problem Relation Age of Onset  . Cancer Mother        Raj JanusUterian  . Diabetes Sister   . Cancer Sister   . Hyperlipidemia Sister   . Heart attack Sister   . Hypertension Sister   . Cancer Sister        Breast  . Hyperlipidemia Sister   . Diabetes Son   . Hyperlipidemia Daughter   . Hypertension Daughter    Scheduled Meds: . amLODipine  5 mg Oral BID  . aspirin EC  81 mg Oral Daily  . atorvastatin  20 mg Oral Daily  . carvedilol  12.5 mg Oral QHS  . carvedilol  6.25 mg Oral Once  . clopidogrel  75 mg Oral Q breakfast  . feeding supplement (ENSURE ENLIVE)  237 mL Oral TID BM  . fluticasone  1 spray Each Nare Daily  . heparin  5,000 Units Subcutaneous Q8H  . hydrALAZINE  50 mg Oral TID  . sodium chloride flush  3 mL Intravenous Q12H   Continuous Infusions: . sodium chloride     PRN Meds:.sodium chloride, acetaminophen, hydrALAZINE, lidocaine (PF), morphine CONCENTRATE, ondansetron (ZOFRAN) IV, simethicone, sodium chloride flush Medications Prior to Admission:  Prior to Admission medications   Medication Sig Start Date End Date Taking?  Authorizing Provider  amLODipine (NORVASC) 5 MG tablet TAKE ONE TABLET BY MOUTH TWICE DAILY Patient taking differently: Take 5 mg by mouth two times a day 01/25/17  Yes Pricilla Riffleoss, Paula V, MD  aspirin 81 MG tablet Take 81 mg by mouth daily.     Yes [provider]  atorvastatin (LIPITOR) 20 MG tablet TAKE 1 TABLET (20 MG TOTAL) BY MOUTH DAILY. 01/25/17  Yes Pricilla Riffleoss, Paula V, MD  CALCIUM PO Take 1 tablet by mouth daily.   Yes [provider]  carvedilol (COREG) 3.125 MG tablet TAKE 1 TABLET (3.125 MG TOTAL) BY MOUTH DAILY. Patient taking differently: Take 3.125 mg by mouth at bedtime 11/05/16  Yes Pricilla Riffleoss, Paula V, MD  Cholecalciferol (VITAMIN D-3) 1000 units CAPS Take 1,000 Units by mouth at bedtime.   Yes [provider]  clopidogrel (PLAVIX) 75 MG tablet TAKE ONE TABLET BY MOUTH DAILY WITH BREAKFAST Patient taking differently: Take 75 mg by mouth once a day 02/23/17  Yes Pricilla Riffleoss, Paula V, MD  Darbepoetin Alfa (ARANESP) 300 MCG/0.6ML SOSY injection Inject 300 mcg into the skin See admin instructions. EVERY 3-4 WEEKS   Yes [provider]  fluticasone (FLONASE) 50 MCG/ACT nasal spray Place 1 spray into both nostrils daily. 07/27/16  Yes [provider]  furosemide (LASIX) 20 MG tablet Take 2 tablets (40 mg total) by mouth daily. Take 2 tablets (40 mg) every other day Patient taking differently: Take 40 mg by mouth See admin instructions. EVERY 5 DAYS 02/18/17  Yes Pricilla Riffleoss, Paula V, MD  hydrALAZINE (APRESOLINE) 25 MG tablet Take 2 tablets (50 mg total) by mouth 3 (three) times daily. 12/31/16  Yes Pricilla Riffleoss, Paula V, MD  Multiple Vitamins-Minerals (PRESERVISION AREDS) CAPS Take 1 capsule by mouth daily.    Yes [provider]   Allergies  Allergen Reactions  . Penicillins Swelling and Rash    Mouth became swollen Has patient had a PCN reaction causing immediate rash, facial/tongue/throat swelling, SOB or lightheadedness with hypotension: Yes Has patient had a PCN reaction  causing severe rash involving mucus membranes or skin necrosis: No Has patient had a PCN reaction that required hospitalization: No Has patient had a PCN reaction occurring within the  last 10 years: No If all of the above answers are "NO", then may proceed with Cephalosporin use.   . Clonidine Derivatives Other (See Comments)    Fatigue and Bradycardia   . Maxzide [Hydrochlorothiazide W-Triamterene] Other (See Comments)    Resulted in Hyponatremia   Review of Systems +shortness of breath +generalized weakness  Physical Exam Weak frail elderly lady S1 S2 Clear Abdomen soft Trace edema Awake alert Oriented  Vital Signs: BP (!) 167/75 (BP Location: Right Arm)   Pulse 73   Temp 98.4 F (36.9 C) (Oral)   Resp 18   Ht 5\' 7"  (1.702 m)   Wt 57.1 kg (125 lb 14.4 oz)   SpO2 94%   BMI 19.72 kg/m  Pain Assessment: No/denies pain POSS *See Group Information*: S-Acceptable,Sleep, easy to arouse Pain Score: 0-No pain   SpO2: SpO2: 94 % O2 Device:SpO2: 94 % O2 Flow Rate: .O2 Flow Rate (L/min): 3 L/min  IO: Intake/output summary:  Intake/Output Summary (Last 24 hours) at 06/09/17 1448 Last data filed at 06/09/17 1328  Gross per 24 hour  Intake              360 ml  Output              850 ml  Net             -490 ml    LBM: Last BM Date: 06/08/17 Baseline Weight: Weight: 57.5 kg (126 lb 12.2 oz) (scale C) Most recent weight: Weight: 57.1 kg (125 lb 14.4 oz)     Palliative Assessment/Data:   PPS 40%  Time In:  12 Time Out:  1310 Time Total:  70 min  Greater than 50%  of this time was spent counseling and coordinating care related to the above assessment and plan.  Signed by: Rosalin Hawking, MD  [phone]  Please contact Palliative Medicine Team phone at [phone] for questions and concerns.  For individual provider: See Loretha Stapler

## 2017-06-09 NOTE — Care Management Important Message (Signed)
Important Message  Patient Details  Name: [name] MRN: [id number] Date of Birth: 1930/09/29   Medicare Important Message Given:  Yes    [name] 06/09/2017, 12:32 PM

## 2017-06-09 NOTE — Progress Notes (Signed)
Patient ID: Jessica MoralesNancy E Burns, female   DOB: [birth year], 83 y.o.   MRN: [id number]  PROGRESS NOTE    Jessica Moralesancy E Stangelo  WUJ:[id number] DOB: [birth year] DOA: 06/05/2017 PCP: Daisy Florooss, Charles Alan, MD   Brief Narrative:  83 year old female with past medical history of chronic diastolic CHF, left pleural effusion status post thoracentesis, CAD, carotid artery stenosis, anemia, CKD Stage IV presented with worsening shortness of breath. She was admitted with CHF exacerbation and started on intravenous Lasix.  Assessment & Plan:   Principal Problem:   Acute on chronic diastolic CHF (congestive heart failure) (HCC) Active Problems:   Hyperlipidemia LDL goal <100   Essential hypertension   PVD (peripheral vascular disease) with claudication (HCC)   Carotid artery stenosis   Pernicious anemia   Chronic renal insufficiency, stage 3 (moderate) (HCC)   Anemia in chronic kidney disease   Dyspnea on exertion   Pleural effusion   Acute on chronic diastolic CHF (congestive heart failure)  - cardiology following. Lasix held because of worsening renal function.  strict input and output. Daily weights. Negative balance of 1255 ml since admission - Echo showed EF of 70-75% with grade 1 diastolic dysfunction - continue aspirin and Coreg  Hypoxia probably secondary to above - Continue oxygen supplementation and wean off as needed  Recurrent Left pleural effusion - status post thoracentesis on 06/06/2017 and removal of 1.2 L fluid.  - Repeat chest x-ray including decubitus films from today shows bilateral free-flowing effusions.  -underwent thoracentesis 10-16 yielding 0.76 L  - if patient continues to have recurrent  Pleural effusion, might have to get cardiaothoracic surgery consultation for pleurodesis. - Palliative care consult for discussions of goals of care -low albumin, probably playing role.  -no evidence of infection.  -Will give another dose of albumin.   Chronic kidney disease stage IV -  creatinine slightly worse today at 2.57. Monitor urine output. Lasix will be held. Repeat a.m. labs -monitor renal function.   Coronary artery disease And history of carotid artery stenosis -Continue aspirin and Plavix.  Essential hypertension.  - continue Coreg and Lasix. Monitor blood pressure  Chronic anemia - Monitor hemoglobin. - outpatient follow-up  Generalized deconditioning - PT eval. care management consult  - patient's overall condition is gradually declining but very poor appetite.Palliative care will be consulted.  Malnutrition; started ensure. Nutritionist consulted.    DVT prophylaxis: heparin Code Status:  full Family Communication: discussed with daughters at bedside Disposition Plan:Home in  1-3 days  Consultants: cardiology  Procedures: Echo on 06/06/2017 Study Conclusions  - Left ventricle: The cavity size was normal. There was severe   concentric hypertrophy. Systolic function was normal. The   estimated ejection fraction was in the range of 70% to 75%. Wall   motion was normal; there were no regional wall motion   abnormalities. Doppler parameters are consistent with abnormal   left ventricular relaxation (grade 1 diastolic dysfunction).   Doppler parameters are consistent with elevated ventricular   end-diastolic filling pressure. - Aortic valve: Trileaflet; mildly thickened, mildly calcified   leaflets. There was no regurgitation. - Mitral valve: Calcified annulus. Mildly thickened leaflets .   There was mild regurgitation. - Left atrium: The atrium was normal in size. - Right ventricle: Systolic function was normal. - Tricuspid valve: There was trivial regurgitation. - Inferior vena cava: The vessel was normal in size. - Pericardium, extracardiac: A mild pericardial effusion was   identified anterior to the heart. Features were not consistent   with tamponade physiology.  There was a large left pleural   effusion.  Impressions:  -  Severe LVH, possibly hypertrophic cardiomyopathy. Hyperdynamic   LVEF, mid cavitary and LVOT gradient. Elevated LVEDP.   Antimicrobials: none    Subjective: Patient feeling better after thoracentesis yesterday   Objective: Vitals:   06/08/17 1217 06/08/17 1811 06/08/17 2023 06/09/17 0636  BP: 125/68 (!) 147/72 (!) 111/52 (!) 167/75  Pulse: (!) 55 61 (!) 57 73  Resp:   16 18  Temp:  (!) 97.4 F (36.3 C) 98.5 F (36.9 C) 98.4 F (36.9 C)  TempSrc:  Oral Oral Oral  SpO2: 97% 94% 98% 94%  Weight:    57.1 kg (125 lb 14.4 oz)  Height:        Intake/Output Summary (Last 24 hours) at 06/09/17 1020 Last data filed at 06/09/17 0639  Gross per 24 hour  Intake              480 ml  Output              700 ml  Net             -220 ml   Filed Weights   06/07/17 2000 06/08/17 0500 06/09/17 0636  Weight: 56.2 kg (123 lb 14.4 oz) 56.2 kg (123 lb 14.4 oz) 57.1 kg (125 lb 14.4 oz)    Examination:  General exam: NAD.  Respiratory system: bilateral air movement, crackles bases.  Cardiovascular system: S 1, S 2 RRR Gastrointestinal system: Abdomen soft, nt  Extremities: No cyanosis, clubbing; trace edema    Data Reviewed: I have personally reviewed following labs and imaging studies  CBC:  Recent Labs Lab 06/05/17 1049 06/06/17 0718 06/07/17 0540 06/09/17 0518  WBC 7.8 6.2 8.0 6.5  NEUTROABS  --  4.6 6.6 5.0  HGB 12.2 10.1* 10.6* 8.8*  HCT 37.8 32.2* 33.7* 27.9*  MCV 86.5 87.0 87.5 86.6  PLT 256 194 204 167   Basic Metabolic Panel:  Recent Labs Lab 06/05/17 1917 06/06/17 0718 06/07/17 0540 06/08/17 0516 06/09/17 0518  NA 136 139 138 136 136  K 4.0 4.1 4.3 4.5 4.8  CL 106 109 106 106 105  CO2 20* 21* 23 24 23   GLUCOSE 101* 80 93 98 101*  BUN 29* 27* 30* 40* 49*  CREATININE 2.16* 2.17* 2.44* 2.57* 2.79*  CALCIUM 7.9* 7.9* 8.1* 7.7* 7.7*  MG 2.1  --  2.2 2.3 2.1   GFR: Estimated Creatinine Clearance: 13.5 mL/min (A) (by C-G formula based on SCr of 2.79 mg/dL  (H)). Liver Function Tests:  Recent Labs Lab 06/08/17 1220 06/09/17 0518  AST  --  20  ALT  --  13*  ALKPHOS  --  67  BILITOT  --  0.4  PROT  --  4.8*  ALBUMIN 2.5* 2.4*   No results for input(s): LIPASE, AMYLASE in the last 168 hours. No results for input(s): AMMONIA in the last 168 hours. Coagulation Profile: No results for input(s): INR, PROTIME in the last 168 hours. Cardiac Enzymes:  Recent Labs Lab 06/05/17 1049 06/05/17 1917 06/06/17 0014 06/06/17 0718  TROPONINI 0.04* 0.04* 0.04* 0.04*   BNP (last 3 results)  Recent Labs  12/31/16 1025 01/22/17 1346 02/01/17 1453  PROBNP 11,403* 11,385* 11,969*   HbA1C: No results for input(s): HGBA1C in the last 72 hours. CBG: No results for input(s): GLUCAP in the last 168 hours. Lipid Profile: No results for input(s): CHOL, HDL, LDLCALC, TRIG, CHOLHDL, LDLDIRECT in the last 72 hours. Thyroid  Function Tests: No results for input(s): TSH, T4TOTAL, FREET4, T3FREE, THYROIDAB in the last 72 hours. Anemia Panel: No results for input(s): VITAMINB12, FOLATE, FERRITIN, TIBC, IRON, RETICCTPCT in the last 72 hours. Sepsis Labs: No results for input(s): PROCALCITON, LATICACIDVEN in the last 168 hours.  Recent Results (from the past 240 hour(s))  Culture, body fluid-bottle     Status: None (Preliminary result)   Collection Time: 06/06/17  1:57 PM  Result Value Ref Range Status   Specimen Description FLUID LEFT PLEURAL  Final   Special Requests NONE  Final   Culture NO GROWTH 2 DAYS  Final   Report Status PENDING  Incomplete  Gram stain     Status: None   Collection Time: 06/06/17  1:57 PM  Result Value Ref Range Status   Specimen Description FLUID LEFT PLEURAL  Final   Special Requests NONE  Final   Gram Stain   Final    FEW WBC PRESENT,BOTH PMN AND MONONUCLEAR NO ORGANISMS SEEN    Report Status 06/06/2017 FINAL  Final         Radiology Studies: Dg Chest 1 View  Result Date: 06/08/2017 CLINICAL DATA:  Status  post thoracentesis EXAM: CHEST 1 VIEW COMPARISON:  Study obtained earlier in the day FINDINGS: There has been resolution of pleural effusion on the left following thoracentesis. There is a fairly small partially loculated pleural effusion on the right. There is a skin fold on the left but no evident pneumothorax. There is no edema or consolidation. There is cardiomegaly with mild pulmonary venous hypertension. There is aortic atherosclerosis. No evident bone lesions. IMPRESSION: Resolution of left pleural effusion following thoracentesis. Small right pleural effusion. With evidence of underlying pulmonary vascular congestion without edema. No pneumothorax evident. There is a skin fold on the left. There is aortic atherosclerosis. Aortic Atherosclerosis (ICD10-I70.0). Electronically Signed   By: Bretta Bang III M.D.   On: 06/08/2017 16:39   Dg Chest 2 View  Result Date: 06/08/2017 CLINICAL DATA:  Shortness of breath, history of pleural effusion EXAM: CHEST  2 VIEW COMPARISON:  Chest x-ray of 06/07/2017 FINDINGS: There are small bilateral pleural effusions present with bibasilar linear atelectasis. No definite pneumonia seen although pneumonia at the lung bases would be difficult to exclude. Mediastinal and hilar contours are unremarkable. The heart is mildly enlarged and stable. Moderate changes of thoracic aortic atherosclerosis are noted. The bones are somewhat osteopenic. IMPRESSION: 1. Small bilateral pleural effusions with bibasilar linear atelectasis. 2. Stable cardiomegaly. 3. Moderate thoracic aortic atherosclerosis. Electronically Signed   By: Dwyane Dee M.D.   On: 06/08/2017 08:52   Dg Chest Bilateral Decubitus  Result Date: 06/08/2017 CLINICAL DATA:  Pleural effusion EXAM: CHEST - BILATERAL DECUBITUS VIEW COMPARISON:  06/08/2017 FINDINGS: There are small to moderate bilateral pleural effusions, free flowing on the left. This appears partially loculated laterally on the right. No change  since prior study. IMPRESSION: Free flowing small to moderate left effusion with partially loculated right effusion. No change. Electronically Signed   By: Charlett Nose M.D.   On: 06/08/2017 11:22   Dg Chest Bilateral Decubitus  Result Date: 06/08/2017 CLINICAL DATA:  Shortness of breath, history of pleural effusion EXAM: CHEST - BILATERAL DECUBITUS VIEW COMPARISON:  Chest x-ray of 06/07/2017 FINDINGS: Right decubitus and left lateral decubitus films of the chest were obtained. The left pleural effusion appears free-flowing and slightly larger than the right. The right pleural effusion also is free-flowing. No definite pneumonia is seen. IMPRESSION: Bilateral free-flowing pleural effusions  left slightly larger right. Electronically Signed   By: Dwyane Dee M.D.   On: 06/08/2017 08:53   Ir Thoracentesis Asp Pleural Space W/img Guide  Result Date: 06/08/2017 INDICATION: Recurrent left pleural effusion. Request is made for therapeutic thoracentesis. EXAM: ULTRASOUND GUIDED THERAPEUTIC THORACENTESIS MEDICATIONS: 1% lidocaine COMPLICATIONS: None immediate. PROCEDURE: An ultrasound guided thoracentesis was thoroughly discussed with the patient and questions answered. The benefits, risks, alternatives and complications were also discussed. The patient understands and wishes to proceed with the procedure. Written consent was obtained. Ultrasound was performed to localize and mark an adequate pocket of fluid in the left chest. Both the left and right side were evaluated. The left had more fluid and therefore this was the side selected. The area was then prepped and draped in the normal sterile fashion. 1% Lidocaine was used for local anesthesia. Under ultrasound guidance a Safe-T-Centesis catheter was introduced. Thoracentesis was performed. The catheter was removed and a dressing applied. FINDINGS: A total of approximately 0.75 L of serous fluid was removed. IMPRESSION: Successful ultrasound guided left  thoracentesis yielding 0.75 L of pleural fluid. Read by: Barnetta Chapel, PA-C Electronically Signed   By: Irish Lack M.D.   On: 06/08/2017 16:46        Scheduled Meds: . amLODipine  5 mg Oral BID  . aspirin EC  81 mg Oral Daily  . atorvastatin  20 mg Oral Daily  . carvedilol  12.5 mg Oral QHS  . carvedilol  6.25 mg Oral Once  . clopidogrel  75 mg Oral Q breakfast  . fluticasone  1 spray Each Nare Daily  . heparin  5,000 Units Subcutaneous Q8H  . hydrALAZINE  50 mg Oral TID  . sodium chloride flush  3 mL Intravenous Q12H   Continuous Infusions: . sodium chloride       LOS: 4 days        Alba Cory, MD [facility] Pager [phone]  If 7PM-7AM, please contact night-coverage [email or web address] Password [email or web address] 06/09/2017, 10:20 AM

## 2017-06-09 NOTE — Progress Notes (Signed)
Progress Note  Patient Name: [name] Date of Encounter: 06/09/2017  Primary Cardiologist: Dr. Tenny Craw  Subjective   Still SOB.  Had another thoracentesis yesterday of .75L from the left side that had reaccumulated.    Inpatient Medications    Scheduled Meds: . amLODipine  5 mg Oral BID  . aspirin EC  81 mg Oral Daily  . atorvastatin  20 mg Oral Daily  . carvedilol  12.5 mg Oral QHS  . carvedilol  6.25 mg Oral Once  . clopidogrel  75 mg Oral Q breakfast  . fluticasone  1 spray Each Nare Daily  . heparin  5,000 Units Subcutaneous Q8H  . hydrALAZINE  50 mg Oral TID  . sodium chloride flush  3 mL Intravenous Q12H   Continuous Infusions: . sodium chloride     PRN Meds: sodium chloride, acetaminophen, hydrALAZINE, lidocaine (PF), ondansetron (ZOFRAN) IV, simethicone, sodium chloride flush   Vital Signs    Vitals:   06/08/17 1217 06/08/17 1811 06/08/17 2023 06/09/17 0636  BP: 125/68 (!) 147/72 (!) 111/52 (!) 167/75  Pulse: (!) 55 61 (!) 57 73  Resp:   16 18  Temp:  (!) 97.4 F (36.3 C) 98.5 F (36.9 C) 98.4 F (36.9 C)  TempSrc:  Oral Oral Oral  SpO2: 97% 94% 98% 94%  Weight:    125 lb 14.4 oz (57.1 kg)  Height:        Intake/Output Summary (Last 24 hours) at 06/09/17 1013 Last data filed at 06/09/17 0639  Gross per 24 hour  Intake              480 ml  Output              700 ml  Net             -220 ml   Filed Weights   06/07/17 2000 06/08/17 0500 06/09/17 0636  Weight: 123 lb 14.4 oz (56.2 kg) 123 lb 14.4 oz (56.2 kg) 125 lb 14.4 oz (57.1 kg)    Telemetry    NSR - Personally Reviewed  ECG    No new EKG to review - Personally Reviewed  Physical Exam   GEN: No acute distress.   Neck: No JVD Cardiac: RRR, no murmurs, rubs, or gallops.  Respiratory: Clear to auscultation bilaterally. GI: Soft, nontender, non-distended  MS: No edema; No deformity. Neuro:  Nonfocal  Psych: Normal affect   Labs    Chemistry Recent Labs Lab 06/07/17 0540  06/08/17 0516 06/08/17 1220 06/09/17 0518  NA 138 136  --  136  K 4.3 4.5  --  4.8  CL 106 106  --  105  CO2 23 24  --  23  GLUCOSE 93 98  --  101*  BUN 30* 40*  --  49*  CREATININE 2.44* 2.57*  --  2.79*  CALCIUM 8.1* 7.7*  --  7.7*  PROT  --   --   --  4.8*  ALBUMIN  --   --  2.5* 2.4*  AST  --   --   --  20  ALT  --   --   --  13*  ALKPHOS  --   --   --  67  BILITOT  --   --   --  0.4  GFRNONAA 17* 16*  --  15*  GFRAA 20* 19*  --  17*  ANIONGAP 9 6  --  8     Hematology Recent Labs  Lab 06/06/17 0718 06/07/17 0540 06/09/17 0518  WBC 6.2 8.0 6.5  RBC 3.70* 3.85* 3.22*  HGB 10.1* 10.6* 8.8*  HCT 32.2* 33.7* 27.9*  MCV 87.0 87.5 86.6  MCH 27.3 27.5 27.3  MCHC 31.4 31.5 31.5  RDW 15.9* 15.9* 15.6*  PLT 194 204 167    Cardiac Enzymes Recent Labs Lab 06/05/17 1049 06/05/17 1917 06/06/17 0014 06/06/17 0718  TROPONINI 0.04* 0.04* 0.04* 0.04*   No results for input(s): TROPIPOC in the last 168 hours.   BNP Recent Labs Lab 06/05/17 1917 06/08/17 1933  BNP 1,485.2* 1,420.4*     DDimer No results for input(s): DDIMER in the last 168 hours.   Radiology    Dg Chest 1 View  Result Date: 06/08/2017 CLINICAL DATA:  Status post thoracentesis EXAM: CHEST 1 VIEW COMPARISON:  Study obtained earlier in the day FINDINGS: There has been resolution of pleural effusion on the left following thoracentesis. There is a fairly small partially loculated pleural effusion on the right. There is a skin fold on the left but no evident pneumothorax. There is no edema or consolidation. There is cardiomegaly with mild pulmonary venous hypertension. There is aortic atherosclerosis. No evident bone lesions. IMPRESSION: Resolution of left pleural effusion following thoracentesis. Small right pleural effusion. With evidence of underlying pulmonary vascular congestion without edema. No pneumothorax evident. There is a skin fold on the left. There is aortic atherosclerosis. Aortic  Atherosclerosis (ICD10-I70.0). Electronically Signed   By: Bretta Bang III M.D.   On: 06/08/2017 16:39   Dg Chest 2 View  Result Date: 06/08/2017 CLINICAL DATA:  Shortness of breath, history of pleural effusion EXAM: CHEST  2 VIEW COMPARISON:  Chest x-ray of 06/07/2017 FINDINGS: There are small bilateral pleural effusions present with bibasilar linear atelectasis. No definite pneumonia seen although pneumonia at the lung bases would be difficult to exclude. Mediastinal and hilar contours are unremarkable. The heart is mildly enlarged and stable. Moderate changes of thoracic aortic atherosclerosis are noted. The bones are somewhat osteopenic. IMPRESSION: 1. Small bilateral pleural effusions with bibasilar linear atelectasis. 2. Stable cardiomegaly. 3. Moderate thoracic aortic atherosclerosis. Electronically Signed   By: Dwyane Dee M.D.   On: 06/08/2017 08:52   Dg Chest Bilateral Decubitus  Result Date: 06/08/2017 CLINICAL DATA:  Pleural effusion EXAM: CHEST - BILATERAL DECUBITUS VIEW COMPARISON:  06/08/2017 FINDINGS: There are small to moderate bilateral pleural effusions, free flowing on the left. This appears partially loculated laterally on the right. No change since prior study. IMPRESSION: Free flowing small to moderate left effusion with partially loculated right effusion. No change. Electronically Signed   By: Charlett Nose M.D.   On: 06/08/2017 11:22   Dg Chest Bilateral Decubitus  Result Date: 06/08/2017 CLINICAL DATA:  Shortness of breath, history of pleural effusion EXAM: CHEST - BILATERAL DECUBITUS VIEW COMPARISON:  Chest x-ray of 06/07/2017 FINDINGS: Right decubitus and left lateral decubitus films of the chest were obtained. The left pleural effusion appears free-flowing and slightly larger than the right. The right pleural effusion also is free-flowing. No definite pneumonia is seen. IMPRESSION: Bilateral free-flowing pleural effusions left slightly larger right. Electronically  Signed   By: Dwyane Dee M.D.   On: 06/08/2017 08:53   Ir Thoracentesis Asp Pleural Space W/img Guide  Result Date: 06/08/2017 INDICATION: Recurrent left pleural effusion. Request is made for therapeutic thoracentesis. EXAM: ULTRASOUND GUIDED THERAPEUTIC THORACENTESIS MEDICATIONS: 1% lidocaine COMPLICATIONS: None immediate. PROCEDURE: An ultrasound guided thoracentesis was thoroughly discussed with the patient and questions answered. The  benefits, risks, alternatives and complications were also discussed. The patient understands and wishes to proceed with the procedure. Written consent was obtained. Ultrasound was performed to localize and mark an adequate pocket of fluid in the left chest. Both the left and right side were evaluated. The left had more fluid and therefore this was the side selected. The area was then prepped and draped in the normal sterile fashion. 1% Lidocaine was used for local anesthesia. Under ultrasound guidance a Safe-T-Centesis catheter was introduced. Thoracentesis was performed. The catheter was removed and a dressing applied. FINDINGS: A total of approximately 0.75 L of serous fluid was removed. IMPRESSION: Successful ultrasound guided left thoracentesis yielding 0.75 L of pleural fluid. Read by: Barnetta Chapel, PA-C Electronically Signed   By: Irish Lack M.D.   On: 06/08/2017 16:46    Cardiac Studies   Echo 06/06/17 Study Conclusions  - Left ventricle: The cavity size was normal. There was severe concentric hypertrophy. Systolic function was normal. The estimated ejection fraction was in the range of 70% to 75%. Wall motion was normal; there were no regional wall motion abnormalities. Doppler parameters are consistent with abnormal left ventricular relaxation (grade 1 diastolic dysfunction). Doppler parameters are consistent with elevated ventricular end-diastolic filling pressure. - Aortic valve: Trileaflet; mildly thickened, mildly  calcified leaflets. There was no regurgitation. - Mitral valve: Calcified annulus. Mildly thickened leaflets . There was mild regurgitation. - Left atrium: The atrium was normal in size. - Right ventricle: Systolic function was normal. - Tricuspid valve: There was trivial regurgitation. - Inferior vena cava: The vessel was normal in size. - Pericardium, extracardiac: A mild pericardial effusion was identified anterior to the heart. Features were not consistent with tamponade physiology. There was a large left pleural effusion.  Impressions:  - Severe LVH, possibly hypertrophic cardiomyopathy. Hyperdynamic LVEF, mid cavitary and LVOT gradient. Elevated LVEDP.  Patient Profile     83 y.o. female with past medical history of carotid artery disease (s/p R CEA in 10/2015), chronic diastolic CHF, Stage 3 CKD, recurrent pleural effusions (s/p prior thoracentesis), HTN, and HLD admitted 06/05/17 for CHF with increased dyspnea.    Assessment & Plan    Acute on Chronic diastolic CHF --BNP on admit 1485  --neg 1.475L since admit and wt down from 126 to 123 lbs but now back up to 125lbs.   Dry weight is around 127lbs based on prior office notes.   --Lasix on hold secondary to worsening renal function - will continue to hold for now.  Creatinine continues to climb to 2.79 and increase likely due to intravascular volume depletion from diuretics used for pleural effusion.  Her weight is down 2lbs from her dry weight.   Would allow her to equilibrate and only use Lasix PRN for weight above 127lbs.   - discussed with Dr. Tenny Craw who is her primary Cardiologist.  Unclear why she continues to get pleural effusions but would avoid Lasix unless overall she appears volume overloaded as she quickly gets worsening renal function.  Would continue with PRN thoracentesis for pleural effusions and only use Lasix if weight is up and she has other signs of volume overload.   Bilateral pl effusions   --thoracentesis 10/14 of Lt, 1.2 L clear yellow fluid- post CXR with reduction of fluid. Repeat thoracentesis yesterday for reaccumulation with 0.75L.    Severe LVH possible hypertrophic cardiomyopathy with hyperdynamic LVEF Mid cavitary and LVOT gradient   SEM -hx aortic sclerosis without stenosis   HTN --BP stable today at  119/3180mmHg --amlodipine 5, coreg 12.5 bid, hydralazine 50 mg TID   CKD- 4 --Cr 2.57 today (2.16>2.17>2.44>2.57)  Troponin flat at 0.04 x 3 --neg nuc in 2014.  Carotid stenosiswith hx R CEA 2017 on ASA and statin and plavix  --carotid dopplers followed by Dr. Myra GianottiBrabham and Rt patent in 11/2016 and Lt with 60-79% stenosis  Palliative Care has been consulted.  I agree with this decision and I had a long talk with her daughter today.  She has continued to decline over the past month and has very little appetite which is reflected in her poor nutritional status.  Her albumin is very low at 2.4 and is likely the major contributor to her reaccumulation of pleural fluid.  Her creatinine continues to trend upward despite holding Lasix.  Not much to offer her and would recommend comfort measures.  Consider IV Albumin. For questions or updates, please contact [facility] Please consult [email or web address] for contact info under Cardiology/STEMI.      Signed, Armanda Magicraci Kirin Pastorino, MD  06/09/2017, 10:13 AM

## 2017-06-09 NOTE — Progress Notes (Addendum)
Occupational Therapy Treatment and Discharge Patient Details Name: [name] MRN: [id number] DOB: 12/27/1930 Today's Date: 06/09/2017    History of present illness [name] is a 83 y.o. female with Past medical history of Chronic diastolic CHF, left pleural effusion, CAD, carotid artery stenosis, anemia, CKD stage III-IV. REports shortness of breath over past month   OT comments  Pt demonstrating good progress toward OT goals. Pt was able to complete toilet transfers and standing grooming tasks with overall supervision with VC's for safe use of RW this session. Educated pt and family concerning energy conservation strategies with handout provided and they verbalize understanding. All further OT needs may be met through home health OT follow-up. No further acute OT needs identified and will sign off.    Follow Up Recommendations  Home health OT    Equipment Recommendations  Other (comment) Agricultural consultant)    Recommendations for Other Services      Precautions / Restrictions Precautions Precautions: Fall Precaution Comments: Slight fall risk; watch O2 sats Restrictions Weight Bearing Restrictions: No       Mobility Bed Mobility Overal bed mobility: Independent                Transfers Overall transfer level: Needs assistance Equipment used: None Transfers: Sit to/from Stand Sit to Stand: Supervision Stand pivot transfers: Min guard       General transfer comment: supervision for safety    Balance Overall balance assessment: Needs assistance Sitting-balance support: No upper extremity supported;Feet supported Sitting balance-Leahy Scale: Good     Standing balance support: Bilateral upper extremity supported;No upper extremity supported;During functional activity Standing balance-Leahy Scale: Fair Standing balance comment: Benefits from UE support with dynamic standing tasks.                            ADL either performed or assessed with  clinical judgement   ADL Overall ADL's : Needs assistance/impaired     Grooming: Supervision/safety;Standing;Wash/dry hands;Wash/dry face;Brushing hair                   Toilet Transfer: Designer, industrial/product Details (indicate cue type and reason): Simulated. Educated on safe use of RW. Pt prefers rollator.          Functional mobility during ADLs: Supervision/safety;Rolling walker General ADL Comments: Pt and daughters educated concerning energy conservation strategies and handout provided (for daughters primarily as pt with low vision due to macular degeneration.      Vision       Perception     Praxis      Cognition Arousal/Alertness: Awake/alert Behavior During Therapy: WFL for tasks assessed/performed Overall Cognitive Status: Within Functional Limits for tasks assessed                                          Exercises     Shoulder Instructions       General Comments Pt on 2L O2 via [place] throughout session.     Pertinent Vitals/ Pain       Pain Assessment: No/denies pain  Home Living                                          Prior Functioning/Environment  Frequency  Min 2X/week        Progress Toward Goals  OT Goals(current goals can now be found in the care plan section)  Progress towards OT goals: Progressing toward goals (all further needs can be met via [facility] services)  Acute Rehab OT Goals Patient Stated Goal: go home OT Goal Formulation: With patient Time For Goal Achievement: 06/21/17 Potential to Achieve Goals: Good  Plan Discharge plan remains appropriate    Co-evaluation                 AM-PAC PT "6 Clicks" Daily Activity     Outcome Measure   Help from another person eating meals?: None Help from another person taking care of personal grooming?: A Little Help from another person toileting, which includes using toliet, bedpan, or urinal?: A  Little Help from another person bathing (including washing, rinsing, drying)?: A Little Help from another person to put on and taking off regular upper body clothing?: A Little Help from another person to put on and taking off regular lower body clothing?: A Little 6 Click Score: 19    End of Session Equipment Utilized During Treatment: Gait belt  OT Visit Diagnosis: Unsteadiness on feet (R26.81);Low vision, both eyes (H54.2)   Activity Tolerance Patient tolerated treatment well   Patient Left in bed;with call bell/phone within reach;with family/visitor present   Nurse Communication Mobility status        Time: 1206-1224 OT Time Calculation (min): 18 min  Charges: OT General Charges $OT Visit: 1 Visit OT Treatments $Self Care/Home Management : 8-22 mins  Norman Herrlich, MS OTR/L  Pager: [place] 06/09/2017, 3:39 PM

## 2017-06-10 DIAGNOSIS — D510 Vitamin B12 deficiency anemia due to intrinsic factor deficiency: Secondary | ICD-10-CM

## 2017-06-10 DIAGNOSIS — N189 Chronic kidney disease, unspecified: Secondary | ICD-10-CM

## 2017-06-10 DIAGNOSIS — N179 Acute kidney failure, unspecified: Secondary | ICD-10-CM

## 2017-06-10 DIAGNOSIS — N184 Chronic kidney disease, stage 4 (severe): Secondary | ICD-10-CM

## 2017-06-10 DIAGNOSIS — I129 Hypertensive chronic kidney disease with stage 1 through stage 4 chronic kidney disease, or unspecified chronic kidney disease: Secondary | ICD-10-CM

## 2017-06-10 LAB — CBC
HCT: 28.3 % — ABNORMAL LOW (ref 36.0–46.0)
Hemoglobin: 8.9 g/dL — ABNORMAL LOW (ref 12.0–15.0)
MCH: 27.5 pg (ref 26.0–34.0)
MCHC: 31.4 g/dL (ref 30.0–36.0)
MCV: 87.3 fL (ref 78.0–100.0)
PLATELETS: 187 10*3/uL (ref 150–400)
RBC: 3.24 MIL/uL — AB (ref 3.87–5.11)
RDW: 16 % — ABNORMAL HIGH (ref 11.5–15.5)
WBC: 7.9 10*3/uL (ref 4.0–10.5)

## 2017-06-10 LAB — BASIC METABOLIC PANEL
ANION GAP: 9 (ref 5–15)
BUN: 55 mg/dL — ABNORMAL HIGH (ref 6–20)
CALCIUM: 8.3 mg/dL — AB (ref 8.9–10.3)
CO2: 23 mmol/L (ref 22–32)
Chloride: 102 mmol/L (ref 101–111)
Creatinine, Ser: 2.63 mg/dL — ABNORMAL HIGH (ref 0.44–1.00)
GFR, EST AFRICAN AMERICAN: 18 mL/min — AB (ref >60)
GFR, EST NON AFRICAN AMERICAN: 16 mL/min — AB (ref >60)
Glucose, Bld: 121 mg/dL — ABNORMAL HIGH (ref 65–99)
Potassium: 5.5 mmol/L — ABNORMAL HIGH (ref 3.5–5.1)
Sodium: 134 mmol/L — ABNORMAL LOW (ref 135–145)

## 2017-06-10 LAB — MAGNESIUM: MAGNESIUM: 2.4 mg/dL (ref 1.7–2.4)

## 2017-06-10 MED ORDER — SODIUM POLYSTYRENE SULFONATE 15 GM/60ML PO SUSP
15.0000 g | ORAL | Status: AC
  Administered 2017-06-10 (×2): 15 g via ORAL
  Filled 2017-06-10 (×2): qty 60

## 2017-06-10 MED ORDER — IPRATROPIUM-ALBUTEROL 0.5-2.5 (3) MG/3ML IN SOLN: 3.0000 mL | Freq: Two times a day (BID) | RESPIRATORY_TRACT | Status: DC

## 2017-06-10 MED ORDER — IPRATROPIUM-ALBUTEROL 0.5-2.5 (3) MG/3ML IN SOLN
3.0000 mL | Freq: Four times a day (QID) | RESPIRATORY_TRACT | Status: DC
  Administered 2017-06-10: 3 mL via RESPIRATORY_TRACT
  Filled 2017-06-10: qty 3

## 2017-06-10 MED ORDER — IPRATROPIUM-ALBUTEROL 0.5-2.5 (3) MG/3ML IN SOLN: 3.0000 mL | Freq: Four times a day (QID) | RESPIRATORY_TRACT | Status: DC | PRN

## 2017-06-10 NOTE — Care Management Note (Signed)
Case Management Note  Patient Details  Name: [name] MRN: [id number] Date of Birth: [birth year]  Subjective/Objective:       CHF            Action/Plan: CM talked to patient with her daughters at the bedside. Patient wants to go home with hospice care ; Home Hospice choice offered, [facility]; referral made as requested. Contact person [name] daughter [phone].  Expected Discharge Date:     Possibly 06/11/2017             Expected Discharge Plan:  Home w Hospice Care  In-House Referral:   Palliative Care  Discharge planning Services  CM Consult  Choice offered to:  Adult Children  HH Agency:  [facility]  Status of Service:  In process, will continue to follow  Reola MosherChandler, Colin Norment L, RN,MHA,BSN [phone] 06/10/2017, 11:38 AM

## 2017-06-10 NOTE — Progress Notes (Signed)
[facility] Hospital Liaison:  RN visit.  Notified by Marlow BaarsBrenda, CMRN, of patient/family request [facility] after discharge.  Chart and patient information [facility].  Hospice eligibility pending at this time.   Writer spoke with patient, 2 daughters and one son at bedside to initiate education related to hospice philosophy, services and team approach to care.  Family verbalized understanding of information given.  Per discussion, plan is for discharge to home by private vehicle on 06/11/17.  Please send signed and completed DNR form home with patient/family.  (Family asked if they could get that while patient was hospitalized.  I did forward information to ReganBrenda, [facility]).  Patient will need prescriptions for discharge comfort medications.   DME needs have been discussed, patient currently has the following equipment in the home:  Shower chair, walker, rollator.  Family requests the following DME for delivery to the home:  Hospital bed 1/2 rails, OBT, O2 concentrator, 3 in1.  [facility] equipment manager has been notified and will contact [facility] to arrange delivery to the home.  Home address has been verified and is correct in the chart.  Heidi, daughter, is the family member to contact to arrange time of delivery.  Call [phone].  [facility] Referral Center aware of the above.  Completed discharge summary will need to [facility] at [phone], when final.  Please notify [facility] when patient is ready to leave the unit at discharge.  Call [phone] or [phone] after 5pm.  [facility] information and contact numbers given to [facility], [facility], during this visit.  Above information shared with Steward DroneBrenda, [facility]CMRN.  Please call with any hospice related questions.  Thank you for the referral.  Adele BarthelAmy Evans, RN, BSN [facility] Hospital Liaison [phone]  All hospital liaisons are now on AMION.

## 2017-06-10 NOTE — Progress Notes (Signed)
PROGRESS NOTE    [name]  ZOX:[id number] DOB: 1931-01-19 DOA: 06/05/2017 PCP: Daisy Floro, MD    Brief Narrative:  83 year old female who presented with dyspnea. Patient does have significant past medical history of chronic diastolic heart failure, left pleural effusion, coronary artery disease and chronic kidney disease stage 3-4. Patient had worsening symptoms for last 4 weeks prior to admission consistent with worsening dyspnea, orthopnea, paroxysmal nocturnal dyspnea and lower extremity edema. On initial physical examination, blood pressure 149/69, heart rate 76, respiratory rate 27, temperature 97.7, oxygen saturation 93- 94%. Moist mucous membranes, positive jugular venous distention,lungs with positive rails at bases, bilaterally, decreased air movement bilaterally with increased respiratoryeffort, abdomen soft nontender, lower extremities with positive edema. Chest x-ray with hyper inflation, appropriate penetration, left rotation, cardiomegaly, left pleural effusion. EKG sinus rhythm, poor R-wave progression, chronic lateral T-wave inversions.   Patient was admitted to the hospital working diagnosis acute on chronic diastolic heart failure complicated by left pleural effusion.   Assessment & Plan:   Principal Problem:   Acute on chronic diastolic CHF (congestive heart failure) (HCC) Active Problems:   Hyperlipidemia LDL goal <100   Essential hypertension   PVD (peripheral vascular disease) with claudication (HCC)   Carotid artery stenosis   Pernicious anemia   Chronic renal insufficiency, stage 3 (moderate) (HCC)   Anemia in chronic kidney disease   Shortness of breath   Pleural effusion   Bilateral pleural effusion   Encounter for palliative care   Goals of care, counseling/discussion   1. Acute on chronic diastolic heart failure. Patient clinically has improved, no dyspnea and no significant lower extremity edema, will continue blood pressure control with  amlodipine, carvedilol, and hydralazine.   2. Transudate bilateral pleural effusions. Follow up chest film personally reviewed, noted small right pleural effusion and improved left effusion after thoracentesis. Likely malnutrition and low oncotic pressure part of the recurrent effusions, will consult dietary for recommendations.     3. Chronic kidney disease stage 4. Renal function with serum cr at 2,6 and serum K at 5,5 with serum bicarbonate at 23, will continue to follow renal panel in am, hold on further diuresis for now. Will order kayexalate to correct hyperkalemia.   4. Coronary artery disease. Patient chest pain free, will continue antiplatelet therapy with asa and clopidogrel. Continue atorvastatin.   5. Hypertension. Continue blood pressure control with hydralazine, amlodipine and coreg.   6. Chronic pernicious anemia. Continue b12 supplementation, hb and hct continue to be stable.   Palliative care team has been consulted and patient's family want to request hospice services at home.   DVT prophylaxis: heparin  Code Status: full Family Communication:  Disposition Plan:    Consultants:     Procedures:     Antimicrobials:       Subjective: Patient feeling better, dyspnea has improved, no chest pain, positive weakness generalized, had desaturation on room air in the hospital. Patient's daughters at the bedside.   Objective: Vitals:   06/09/17 0636 06/09/17 1954 06/10/17 0452 06/10/17 0700  BP: (!) 167/75 126/61 134/79   Pulse: 73 60 63   Resp: 18 16 18    Temp: 98.4 F (36.9 C) 98.5 F (36.9 C) 98.9 F (37.2 C)   TempSrc: Oral Oral Oral   SpO2: 94% 94% 92%   Weight: 57.1 kg (125 lb 14.4 oz)   58.1 kg (128 lb)  Height:        Intake/Output Summary (Last 24 hours) at 06/10/17 0948 Last data  filed at 06/10/17 0300  Gross per 24 hour  Intake              597 ml  Output              850 ml  Net             -253 ml   Filed Weights   06/08/17 0500 06/09/17  0636 06/10/17 0700  Weight: 56.2 kg (123 lb 14.4 oz) 57.1 kg (125 lb 14.4 oz) 58.1 kg (128 lb)    Examination:   General: deconditioned Neurology: Awake and alert, non focal  E ENT: mild pallor, no icterus, oral mucosa moist Cardiovascular: No JVD. S1-S2 present, rhythmic, no gallops, rubs, or murmurs. + pitting lower extremity edema, more right than left. Dependent edema.  Pulmonary: vesicular breath sounds bilaterally, adequate air movement, no wheezing, rhonchi or rales. Decreased breath sounds at bases.  Gastrointestinal. Abdomen flat, no organomegaly, non tender, no rebound or guarding Skin. No rashes Musculoskeletal: no joint deformities     Data Reviewed: I have personally reviewed following labs and imaging studies  CBC:  Recent Labs Lab 06/05/17 1049 06/06/17 0718 06/07/17 0540 06/09/17 0518 06/10/17 0517  WBC 7.8 6.2 8.0 6.5 7.9  NEUTROABS  --  4.6 6.6 5.0  --   HGB 12.2 10.1* 10.6* 8.8* 8.9*  HCT 37.8 32.2* 33.7* 27.9* 28.3*  MCV 86.5 87.0 87.5 86.6 87.3  PLT 256 194 204 167 187   Basic Metabolic Panel:  Recent Labs Lab 06/05/17 1917 06/06/17 0718 06/07/17 0540 06/08/17 0516 06/09/17 0518 06/10/17 0517  NA 136 139 138 136 136 134*  K 4.0 4.1 4.3 4.5 4.8 5.5*  CL 106 109 106 106 105 102  CO2 20* 21* 23 24 23 23   GLUCOSE 101* 80 93 98 101* 121*  BUN 29* 27* 30* 40* 49* 55*  CREATININE 2.16* 2.17* 2.44* 2.57* 2.79* 2.63*  CALCIUM 7.9* 7.9* 8.1* 7.7* 7.7* 8.3*  MG 2.1  --  2.2 2.3 2.1 2.4   GFR: Estimated Creatinine Clearance: 14.6 mL/min (A) (by C-G formula based on SCr of 2.63 mg/dL (H)). Liver Function Tests:  Recent Labs Lab 06/08/17 1220 06/09/17 0518  AST  --  20  ALT  --  13*  ALKPHOS  --  67  BILITOT  --  0.4  PROT  --  4.8*  ALBUMIN 2.5* 2.4*   No results for input(s): LIPASE, AMYLASE in the last 168 hours. No results for input(s): AMMONIA in the last 168 hours. Coagulation Profile: No results for input(s): INR, PROTIME in the  last 168 hours. Cardiac Enzymes:  Recent Labs Lab 06/05/17 1049 06/05/17 1917 06/06/17 0014 06/06/17 0718  TROPONINI 0.04* 0.04* 0.04* 0.04*   BNP (last 3 results)  Recent Labs  12/31/16 1025 01/22/17 1346 02/01/17 1453  PROBNP 11,403* 11,385* 11,969*   HbA1C: No results for input(s): HGBA1C in the last 72 hours. CBG: No results for input(s): GLUCAP in the last 168 hours. Lipid Profile: No results for input(s): CHOL, HDL, LDLCALC, TRIG, CHOLHDL, LDLDIRECT in the last 72 hours. Thyroid Function Tests: No results for input(s): TSH, T4TOTAL, FREET4, T3FREE, THYROIDAB in the last 72 hours. Anemia Panel: No results for input(s): VITAMINB12, FOLATE, FERRITIN, TIBC, IRON, RETICCTPCT in the last 72 hours.    Radiology Studies: I have reviewed all of the imaging during this hospital visit personally     Scheduled Meds: . amLODipine  5 mg Oral BID  . aspirin EC  81 mg  Oral Daily  . atorvastatin  20 mg Oral Daily  . carvedilol  12.5 mg Oral QHS  . carvedilol  6.25 mg Oral Once  . clopidogrel  75 mg Oral Q breakfast  . feeding supplement (ENSURE ENLIVE)  237 mL Oral TID BM  . fluticasone  1 spray Each Nare Daily  . heparin  5,000 Units Subcutaneous Q8H  . hydrALAZINE  50 mg Oral TID  . sodium chloride flush  3 mL Intravenous Q12H   Continuous Infusions: . sodium chloride       LOS: 5 days        Enola Siebers Annett Gulaaniel Jamare Vanatta, MD [facility] Pager [phone]

## 2017-06-10 NOTE — Progress Notes (Signed)
PMT progress note  83 year old lady with past medical history as listed above [place]. It is noted that for the past month or so the patient has had increasing shortness of breath, worsening appetite, gradual progressive functional status decline. The patient has had shortness of breath even at rest. The patient has been sleeping propped up on pillows, eventually started sleeping in a recliner at home. The patient was on Lasix but continued to have shortness of breath which prompted this hospitalization. The patient has undergone thoracenteses twice since this hospitalization. Additional workup with echocardiography has revealed severe left ventricular hypertrophy, possible hypertrophic cardiomyopathy. Cardiology is following. Patient is on ongoing diuretic adjustment, consideration is being given for possible IV albumin infusion, however, hospital course has been complicated by worsening kidney function as well. Patient has underlying stage III chronic kidney disease.   A palliative consultation has been requested for symptom management and goals of care discussions.  S: the patient is awake alert resting in bed, she states she feels "fit as a fiddle"  O: BP 134/79 (BP Location: Right Arm)   Pulse 63   Temp 98.9 F (37.2 C) (Oral)   Resp 18   Ht 5\' 7"  (1.702 m)   Wt 58.1 kg (128 lb) Comment: C scale  SpO2 92%   BMI 20.05 kg/m  Labs and imaging noted, renal fxn some what better this am. Still with hyperkalemia  Denies chest pain, denies shortness of breath this am.   Awake alert Some what confused this am, daughters at bedside say that this is close to her baseline Regular pattern of breathing Edema S1 S2 Abdomen soft  A/P: Dyspnea Diastolic CHF, LVH, possible HOCM AKI, underlying CKD  PLAN: Recommend patient continue low dose morphine on d.c.  Patient wishes to go home with daughters, and home health care soon.  Recommend  patient continue advanced care planning, I have recommended her to continue code status discussions with her family, to continue discussions on making a living will, making a HCPOA document.   I will put in a care manager consult to facilitate setting up a "care connections" referral for out patient palliative care follow up.   No additional recommendations at this time.   25 minutes spent Rosalin HawkingZeba Ayub Kirsh MD [phone]  [phone]  Palliative Medicine Team

## 2017-06-10 NOTE — Progress Notes (Addendum)
Patient ambulated in the hallway.  Oxygen saturation 84-85% on room air. Oxygen saturation 92-94% with 2L oxygen via nasal cannula.

## 2017-06-10 NOTE — Progress Notes (Signed)
 Progress Note  Patient Name: [name] Date of Encounter: 06/10/2017  Primary Cardiologist: Dr. Ross  Subjective   Continues to be SOB with ambulation.  O2 sats in mid to low 90's on 2L O2.    Inpatient Medications    Scheduled Meds: . amLODipine  5 mg Oral BID  . aspirin EC  81 mg Oral Daily  . atorvastatin  20 mg Oral Daily  . carvedilol  12.5 mg Oral QHS  . carvedilol  6.25 mg Oral Once  . clopidogrel  75 mg Oral Q breakfast  . feeding supplement (ENSURE ENLIVE)  237 mL Oral TID BM  . fluticasone  1 spray Each Nare Daily  . heparin  5,000 Units Subcutaneous Q8H  . hydrALAZINE  50 mg Oral TID  . ipratropium-albuterol  3 mL Nebulization Q6H  . sodium chloride flush  3 mL Intravenous Q12H  . sodium polystyrene  15 g Oral Q4H   Continuous Infusions: . sodium chloride     PRN Meds: sodium chloride, acetaminophen, hydrALAZINE, lidocaine (PF), morphine CONCENTRATE, naphazoline-glycerin, ondansetron (ZOFRAN) IV, simethicone, sodium chloride flush   Vital Signs    Vitals:   06/09/17 1954 06/10/17 0452 06/10/17 0700 06/10/17 1000  BP: 126/61 134/79  133/80  Pulse: 60 63  63  Resp: 16 18  18  Temp: 98.5 F (36.9 C) 98.9 F (37.2 C)    TempSrc: Oral Oral    SpO2: 94% 92%  96%  Weight:   128 lb (58.1 kg)   Height:        Intake/Output Summary (Last 24 hours) at 06/10/17 1041 Last data filed at 06/10/17 0300  Gross per 24 hour  Intake              597 ml  Output              850 ml  Net             -253 ml   Filed Weights   06/08/17 0500 06/09/17 0636 06/10/17 0700  Weight: 123 lb 14.4 oz (56.2 kg) 125 lb 14.4 oz (57.1 kg) 128 lb (58.1 kg)    Telemetry    NSR - Personally Reviewed  ECG    No new EKG to review - Personally Reviewed  Physical Exam   GEN: thin female currently dry heaving after getting Kayexalate. HEENT: Normal NECK: No JVD; No carotid bruits LYMPHATICS: No lymphadenopathy CARDIAC:RRR, no murmurs, rubs, gallops RESPIRATORY:   Decreased BS at right base ABDOMEN: Soft, non-tender, non-distended MUSCULOSKELETAL:  No edema; No deformity  SKIN: Warm and dry NEUROLOGIC:  Alert and oriented x 3 PSYCHIATRIC:  Normal affect    Labs    Chemistry  Recent Labs Lab 06/08/17 0516 06/08/17 1220 06/09/17 0518 06/10/17 0517  NA 136  --  136 134*  K 4.5  --  4.8 5.5*  CL 106  --  105 102  CO2 24  --  23 23  GLUCOSE 98  --  101* 121*  BUN 40*  --  49* 55*  CREATININE 2.57*  --  2.79* 2.63*  CALCIUM 7.7*  --  7.7* 8.3*  PROT  --   --  4.8*  --   ALBUMIN  --  2.5* 2.4*  --   AST  --   --  20  --   ALT  --   --  13*  --   ALKPHOS  --   --  67  --   BILITOT  --   --    0.4  --   GFRNONAA 16*  --  15* 16*  GFRAA 19*  --  17* 18*  ANIONGAP 6  --  8 9     Hematology  Recent Labs Lab 06/07/17 0540 06/09/17 0518 06/10/17 0517  WBC 8.0 6.5 7.9  RBC 3.85* 3.22* 3.24*  HGB 10.6* 8.8* 8.9*  HCT 33.7* 27.9* 28.3*  MCV 87.5 86.6 87.3  MCH 27.5 27.3 27.5  MCHC 31.5 31.5 31.4  RDW 15.9* 15.6* 16.0*  PLT 204 167 187    Cardiac Enzymes  Recent Labs Lab 06/05/17 1049 06/05/17 1917 06/06/17 0014 06/06/17 0718  TROPONINI 0.04* 0.04* 0.04* 0.04*   No results for input(s): TROPIPOC in the last 168 hours.   BNP  Recent Labs Lab 06/05/17 1917 06/08/17 1933  BNP 1,485.2* 1,420.4*     DDimer No results for input(s): DDIMER in the last 168 hours.   Radiology    Dg Chest 1 View  Result Date: 06/08/2017 CLINICAL DATA:  Status post thoracentesis EXAM: CHEST 1 VIEW COMPARISON:  Study obtained earlier in the day FINDINGS: There has been resolution of pleural effusion on the left following thoracentesis. There is a fairly small partially loculated pleural effusion on the right. There is a skin fold on the left but no evident pneumothorax. There is no edema or consolidation. There is cardiomegaly with mild pulmonary venous hypertension. There is aortic atherosclerosis. No evident bone lesions. IMPRESSION:  Resolution of left pleural effusion following thoracentesis. Small right pleural effusion. With evidence of underlying pulmonary vascular congestion without edema. No pneumothorax evident. There is a skin fold on the left. There is aortic atherosclerosis. Aortic Atherosclerosis (ICD10-I70.0). Electronically Signed   By: Lowella Grip III M.D.   On: 06/08/2017 16:39   Dg Chest Bilateral Decubitus  Result Date: 06/08/2017 CLINICAL DATA:  Pleural effusion EXAM: CHEST - BILATERAL DECUBITUS VIEW COMPARISON:  06/08/2017 FINDINGS: There are small to moderate bilateral pleural effusions, free flowing on the left. This appears partially loculated laterally on the right. No change since prior study. IMPRESSION: Free flowing small to moderate left effusion with partially loculated right effusion. No change. Electronically Signed   By: Rolm Baptise M.D.   On: 06/08/2017 11:22   Ir Thoracentesis Asp Pleural Space W/img Guide  Result Date: 06/08/2017 INDICATION: Recurrent left pleural effusion. Request is made for therapeutic thoracentesis. EXAM: ULTRASOUND GUIDED THERAPEUTIC THORACENTESIS MEDICATIONS: 1% lidocaine COMPLICATIONS: None immediate. PROCEDURE: An ultrasound guided thoracentesis was thoroughly discussed with the patient and questions answered. The benefits, risks, alternatives and complications were also discussed. The patient understands and wishes to proceed with the procedure. Written consent was obtained. Ultrasound was performed to localize and mark an adequate pocket of fluid in the left chest. Both the left and right side were evaluated. The left had more fluid and therefore this was the side selected. The area was then prepped and draped in the normal sterile fashion. 1% Lidocaine was used for local anesthesia. Under ultrasound guidance a Safe-T-Centesis catheter was introduced. Thoracentesis was performed. The catheter was removed and a dressing applied. FINDINGS: A total of approximately 0.75 L  of serous fluid was removed. IMPRESSION: Successful ultrasound guided left thoracentesis yielding 0.75 L of pleural fluid. Read by: Saverio Danker, PA-C Electronically Signed   By: Aletta Edouard M.D.   On: 06/08/2017 16:46    Cardiac Studies   Echo 06/06/17 Study Conclusions  - Left ventricle: The cavity size was normal. There was severe concentric hypertrophy. Systolic function was normal. The estimated  ejection fraction was in the range of 70% to 75%. Wall motion was normal; there were no regional wall motion abnormalities. Doppler parameters are consistent with abnormal left ventricular relaxation (grade 1 diastolic dysfunction). Doppler parameters are consistent with elevated ventricular end-diastolic filling pressure. - Aortic valve: Trileaflet; mildly thickened, mildly calcified leaflets. There was no regurgitation. - Mitral valve: Calcified annulus. Mildly thickened leaflets . There was mild regurgitation. - Left atrium: The atrium was normal in size. - Right ventricle: Systolic function was normal. - Tricuspid valve: There was trivial regurgitation. - Inferior vena cava: The vessel was normal in size. - Pericardium, extracardiac: A mild pericardial effusion was identified anterior to the heart. Features were not consistent with tamponade physiology. There was a large left pleural effusion.  Impressions:  - Severe LVH, possibly hypertrophic cardiomyopathy. Hyperdynamic LVEF, mid cavitary and LVOT gradient. Elevated LVEDP.  Patient Profile     83 y.o. female with past medical history of carotid artery disease (s/p R CEA in 10/2015), chronic diastolic CHF, Stage 3 CKD, recurrent pleural effusions (s/p prior thoracentesis), HTN, and HLD admitted 06/05/17 for CHF with increased dyspnea.    Assessment & Plan    Acute on Chronic diastolic CHF --BNP on admit 1485  --neg 1.7L since admit and wt trending back up to 128lbs.   Dry weight is  around 127lbs based on prior office notes.   --Lasix on hold secondary to worsening renal function - will continue to hold for now.   --Creatinine appears to have leveled off at 2.63.  The increase likely due to intravascular volume depletion from diuretics used for pleural effusion in setting of significant hypoalbuminemia.   -- discussed with Dr. Ross who is her primary Cardiologist.  Unclear why she continues to get pleural effusions but would avoid Lasix unless overall she appears volume overloaded as she quickly gets worsening renal function.  Would continue with PRN thoracentesis for pleural effusions and only use Lasix if weight is up and she has other signs of volume overload.  --Getting IV Albumin PRN to help with low albumin --met with Palliative Care yesterday and today and plan if for Hospice at home.  Getting morphine PRN for SOB which has helped.    Bilateral pl effusions  --thoracentesis 10/14 of Lt, 1.2 L clear yellow fluid- post CXR with reduction of fluid. Repeat thoracentesis yesterday for reaccumulation with 0.75L.    Severe LVH possible hypertrophic cardiomyopathy with hyperdynamic LVEF Mid cavitary and LVOT gradient   SEM -hx aortic sclerosis without stenosis   HTN --BP stable today at 133/80mmHg today --amlodipine 5, coreg 12.5 bid, hydralazine 50 mg TID   CKD- 4 --Cr 2.57 today (2.16>2.17>2.44>2.57>2.79>2.63)  Troponin flat at 0.04 x 3 --neg nuc in 2014.  Carotid stenosiswith hx R CEA 2017 on ASA and statin and plavix  --carotid dopplers followed by Dr. Brabham and Rt patent in 11/2016 and Lt with 60-79% stenosis  No other recs at this time.  I talked with 2 of her daughters today and they understand that there is nothing more we can do especially in the setting of worsening renal function.  They asked about PRN thoracentesis which I told her we can arrange as outpt but at some point she may decide she does not want to continue with that and it is only a  temporizing measure.  Would not use diuretics to treat her effusions as it does not help with the effusions much and only worsens her renal function.  Will sign off.    Call with any questions.   For questions or updates, please contact [facility] Please consult [email or web address] for contact info under Cardiology/STEMI.      Signed, Traci Turner, MD  06/10/2017, 10:41 AM   

## 2017-06-11 ENCOUNTER — Telehealth: Payer: Self-pay | Admitting: Internal Medicine

## 2017-06-11 LAB — BASIC METABOLIC PANEL
Anion gap: 9 (ref 5–15)
BUN: 62 mg/dL — AB (ref 6–20)
CALCIUM: 8.1 mg/dL — AB (ref 8.9–10.3)
CO2: 23 mmol/L (ref 22–32)
CREATININE: 2.51 mg/dL — AB (ref 0.44–1.00)
Chloride: 103 mmol/L (ref 101–111)
GFR calc Af Amer: 19 mL/min — ABNORMAL LOW (ref >60)
GFR, EST NON AFRICAN AMERICAN: 17 mL/min — AB (ref >60)
GLUCOSE: 96 mg/dL (ref 65–99)
Potassium: 4.8 mmol/L (ref 3.5–5.1)
SODIUM: 135 mmol/L (ref 135–145)

## 2017-06-11 LAB — CULTURE, BODY FLUID-BOTTLE: CULTURE: NO GROWTH

## 2017-06-11 LAB — CULTURE, BODY FLUID W GRAM STAIN -BOTTLE

## 2017-06-11 MED ORDER — ENSURE ENLIVE PO LIQD: 237.0000 mL | Freq: Three times a day (TID) | ORAL | 0 refills | Status: AC

## 2017-06-11 MED ORDER — CARVEDILOL 12.5 MG PO TABS: 12.5000 mg | ORAL_TABLET | Freq: Every day | ORAL | 0 refills | Status: AC

## 2017-06-11 MED ORDER — FUROSEMIDE 40 MG PO TABS: 40.0000 mg | ORAL_TABLET | Freq: Two times a day (BID) | ORAL | 11 refills | Status: AC | PRN

## 2017-06-11 NOTE — Discharge Summary (Addendum)
Physician Discharge Summary  [name]:[id number] DOB: May 31, 1931 DOA: 06/05/2017  PCP: Daisy Floro, MD  Admit date: 06/05/2017 Discharge date: 06/11/2017  Admitted From: Home Disposition:  Home  Recommendations for Outpatient Follow-up:  1. Follow up with PCP in 1- week 2. Carvedilol has been reduced to 3.125 mg bid 3. Started on nutritional supplements 4. Furosemide change to as needed for edema 40 mg bid 5. Patient will be discharge home with hospice services  Home Health: No  Equipment/Devices: Home 02   Discharge Condition: stable CODE STATUS: DNR  Diet recommendation: Heart healthy  Brief/Interim Summary: 83 year old female who presented with dyspnea. Patient does have significant past medical history of chronic diastolic heart failure, left pleural effusion, coronary artery disease and chronic kidney disease stage 3-4. Patient had worsening symptoms for last 4 weeks prior to admission consistent with worsening dyspnea, orthopnea, paroxysmal nocturnal dyspnea and lower extremity edema. On initial physical examination, blood pressure 149/69, heart rate 76, respiratory rate 27, temperature 97.7, oxygen saturation 93- 94%. Moist mucous membranes, positive jugular venous distention,lungs with positive rails at bases, bilaterally, decreased air movement bilaterally with increased respiratoryeffort, abdomen soft nontender, lower extremities with positive edema. Sodium 138, potassium 4.2, chloride 107, bicarbonate 22, glucose 127, BUN 30, creatinine 2.2, BNP 1485, white count 7.8, Hb12.2, hematocrit 37.8, platelets 256.  Chest x-ray with hyper inflation, appropriate penetration, left rotation, cardiomegaly, left pleural effusion. EKG sinus rhythm, poor R-wave progression, chronic lateral T-wave inversions.   Patient was admitted to the hospital working diagnosis acute on chronic diastolic heart failure complicated by left pleural effusion.  1. Acute on chronic diastolic  heart failure with severe left ventricular hypertrophy, likely hypertrophic cardiomyopathy. Patient was admitted to the medical ward, she was placed on a remote telemetry monitoring, started on aggressive diuresis with IV furosemide. Negative fluid balance was achieved, negative 2036 ml. Unable to further diuresis due to worsening kidney function. Patient does have significant calorie protein malnutrition, which is likely playing a big role in persistent edema. Patient will be discharged on low-dose of carvedilol, continue blood pressure with amlodipine and hydralazine. Furosemide will be used only as needed.  2. Bilateral transudative pleural effusions with acute hypoxic respiratory failure. Likely related to diastolic heart failure decompensation, along with significant decreased oncotic pressure related to colorie protein malnutrition. Patient had thoracentesis ultrasound-guided on the left 2 , obtaining 1.2 L on the first procedure and 0.75 L on the second procedure. Will target a negative fluid balance, nutrition consuledt and patient placed on nutritional supplements.  Patient received oxygen supplementation per nasal cannula, at her discharge her oxygenation saturation was 84 to 85% on room air, with improvement to 92-94% with 2 L per nasal cannula. Home oxygen will be prescribed.   3. Chronic kidney disease stage IV with hyperkalemia. Patient initially received IV diuresis, but the worsening creatinine this treatment was held. Peak creatinine 2.79, discharge creatinine 2. 51. Potassium at discharge 4.8 with a serum bicarbonate 23. Liver, and continue furosemide only as needed.  4. Hypertension. Patient will continue blood pressure control with hydralazine, amlodipine and carvedilol.  5. Coronary artery disease. Patient remained chest pain-free, continue antiplatelet therapy with aspirin and clopidogrel, continue statin with atorvastatin.   6. Chronic pernicious anemia. Continue B12  supplementation, no current indication for PRBC transfusion. Discharge hemoglobin 8.9, hematocrit 28.3.  7. Calorie protein malnutrition. Will continue ultrafiltration supplements and close follow-up as an outpatient.    Discharge Diagnoses:  Principal Problem:   Acute on chronic diastolic  CHF (congestive heart failure) (HCC) Active Problems:   Hyperlipidemia LDL goal <100   Essential hypertension   PVD (peripheral vascular disease) with claudication (HCC)   Carotid artery stenosis   Pernicious anemia   Chronic renal insufficiency, stage 3 (moderate) (HCC)   Anemia in chronic kidney disease   Shortness of breath   Pleural effusion   Bilateral pleural effusion   Encounter for palliative care   Goals of care, counseling/discussion   Acute kidney injury superimposed on CKD [facility])    Discharge Instructions   Allergies as of 06/11/2017      Reactions   Penicillins Swelling, Rash   Mouth became swollen Has patient had a PCN reaction causing immediate rash, facial/tongue/throat swelling, SOB or lightheadedness with hypotension: Yes Has patient had a PCN reaction causing severe rash involving mucus membranes or skin necrosis: No Has patient had a PCN reaction that required hospitalization: No Has patient had a PCN reaction occurring within the last 10 years: No If all of the above answers are "NO", then may proceed with Cephalosporin use.   Clonidine Derivatives Other (See Comments)   Fatigue and Bradycardia   Maxzide [hydrochlorothiazide W-triamterene] Other (See Comments)   Resulted in Hyponatremia      Medication List    TAKE these medications   amLODipine 5 MG tablet Commonly known as:  NORVASC TAKE ONE TABLET BY MOUTH TWICE DAILY What changed:  See the new instructions.   aspirin 81 MG tablet Take 81 mg by mouth daily.   atorvastatin 20 MG tablet Commonly known as:  LIPITOR TAKE 1 TABLET (20 MG TOTAL) BY MOUTH DAILY.   CALCIUM PO Take 1 tablet by mouth daily.    carvedilol 12.5 MG tablet Commonly known as:  COREG Take 1 tablet (12.5 mg total) by mouth at bedtime. What changed:  medication strength  See the new instructions.   clopidogrel 75 MG tablet Commonly known as:  PLAVIX TAKE ONE TABLET BY MOUTH DAILY WITH BREAKFAST What changed:  See the new instructions.   Darbepoetin Alfa 300 MCG/0.6ML Sosy injection Commonly known as:  ARANESP Inject 300 mcg into the skin See admin instructions. EVERY 3-4 WEEKS   feeding supplement (ENSURE ENLIVE) Liqd Take 237 mLs by mouth 3 (three) times daily between meals.   fluticasone 50 MCG/ACT nasal spray Commonly known as:  FLONASE Place 1 spray into both nostrils daily.   furosemide 40 MG tablet Commonly known as:  LASIX Take 1 tablet (40 mg total) by mouth 2 (two) times daily as needed. Use as needed for severe swelling. What changed:  medication strength  when to take this  reasons to take this  additional instructions   hydrALAZINE 25 MG tablet Commonly known as:  APRESOLINE Take 2 tablets (50 mg total) by mouth 3 (three) times daily.   PRESERVISION AREDS Caps Take 1 capsule by mouth daily.   Vitamin D-3 1000 units Caps Take 1,000 Units by mouth at bedtime.            Durable Medical Equipment        Start     Ordered   06/11/17 0000  For home use only DME oxygen    Question Answer Comment  Mode or (Route) Nasal cannula   Liters per Minute 2   Frequency Continuous (stationary and portable oxygen unit needed)   Oxygen conserving device Yes   Oxygen delivery system Gas      06/11/17 1014      Allergies  Allergen Reactions  .  Penicillins Swelling and Rash    Mouth became swollen Has patient had a PCN reaction causing immediate rash, facial/tongue/throat swelling, SOB or lightheadedness with hypotension: Yes Has patient had a PCN reaction causing severe rash involving mucus membranes or skin necrosis: No Has patient had a PCN reaction that required  hospitalization: No Has patient had a PCN reaction occurring within the last 10 years: No If all of the above answers are "NO", then may proceed with Cephalosporin use.   . Clonidine Derivatives Other (See Comments)    Fatigue and Bradycardia   . Maxzide [Hydrochlorothiazide W-Triamterene] Other (See Comments)    Resulted in Hyponatremia    Consultations:  Cardiology  IR   Procedures/Studies: Dg Chest 1 View  Result Date: 06/08/2017 CLINICAL DATA:  Status post thoracentesis EXAM: CHEST 1 VIEW COMPARISON:  Study obtained earlier in the day FINDINGS: There has been resolution of pleural effusion on the left following thoracentesis. There is a fairly small partially loculated pleural effusion on the right. There is a skin fold on the left but no evident pneumothorax. There is no edema or consolidation. There is cardiomegaly with mild pulmonary venous hypertension. There is aortic atherosclerosis. No evident bone lesions. IMPRESSION: Resolution of left pleural effusion following thoracentesis. Small right pleural effusion. With evidence of underlying pulmonary vascular congestion without edema. No pneumothorax evident. There is a skin fold on the left. There is aortic atherosclerosis. Aortic Atherosclerosis (ICD10-I70.0). Electronically Signed   By: Bretta Bang III M.D.   On: 06/08/2017 16:39   Dg Chest 1 View  Result Date: 06/06/2017 CLINICAL DATA:  S/p left thoracentesis EXAM: CHEST 1 VIEW COMPARISON:  06/05/2017 FINDINGS: Reduction in LEFT pleural fluid following thoracentesis. No LEFT pneumothorax appreciated. No RIGHT pneumothorax. Lungs are hyperinflated. Cardiac silhouette is enlarged. IMPRESSION: No pneumothorax appreciated following LEFT thoracentesis. Reduction in LEFT pleural fluid. Electronically Signed   By: Genevive Bi M.D.   On: 06/06/2017 14:32   Dg Chest 2 View  Result Date: 06/08/2017 CLINICAL DATA:  Shortness of breath, history of pleural effusion EXAM: CHEST   2 VIEW COMPARISON:  Chest x-ray of 06/07/2017 FINDINGS: There are small bilateral pleural effusions present with bibasilar linear atelectasis. No definite pneumonia seen although pneumonia at the lung bases would be difficult to exclude. Mediastinal and hilar contours are unremarkable. The heart is mildly enlarged and stable. Moderate changes of thoracic aortic atherosclerosis are noted. The bones are somewhat osteopenic. IMPRESSION: 1. Small bilateral pleural effusions with bibasilar linear atelectasis. 2. Stable cardiomegaly. 3. Moderate thoracic aortic atherosclerosis. Electronically Signed   By: Dwyane Dee M.D.   On: 06/08/2017 08:52   Dg Chest 2 View  Result Date: 06/07/2017 CLINICAL DATA:  Shortness of breath. EXAM: CHEST  2 VIEW COMPARISON:  Chest x-ray from yesterday. FINDINGS: Stable cardiomegaly. Normal pulmonary vascularity. Atherosclerotic calcification of the aortic arch. New small right pleural effusion. Slightly increased trace left pleural effusion. Bibasilar atelectasis. No pneumothorax. No acute osseous abnormality. IMPRESSION: New small right pleural effusion and slight interval increase in size of trace left pleural effusion. Electronically Signed   By: Obie Dredge M.D.   On: 06/07/2017 09:22   Dg Chest 2 View  Result Date: 06/05/2017 CLINICAL DATA:  Shortness of breath. EXAM: CHEST  2 VIEW COMPARISON:  04/19/2017 FINDINGS: The cardiac silhouette is stably enlarged. Mediastinal contours appear intact. Calcific atherosclerotic disease of the aorta. There has been interval increase in the size of left pleural effusion, which is now moderate. There has also been interval increase  in size of the small right pleural effusion. Previously noted bilateral lower lobe airspace consolidation is mostly obscured by the pleural effusions. No evidence of pneumothorax. Osseous structures are without acute abnormality. Soft tissues are grossly normal. IMPRESSION: Enlargement of the bilateral  pleural effusions, moderate in size on the left and small on the right. Enlarged cardiac silhouette. Calcific atherosclerotic disease of the aorta. Electronically Signed   By: Ted Mcalpine M.D.   On: 06/05/2017 11:09   Dg Chest Bilateral Decubitus  Result Date: 06/08/2017 CLINICAL DATA:  Pleural effusion EXAM: CHEST - BILATERAL DECUBITUS VIEW COMPARISON:  06/08/2017 FINDINGS: There are small to moderate bilateral pleural effusions, free flowing on the left. This appears partially loculated laterally on the right. No change since prior study. IMPRESSION: Free flowing small to moderate left effusion with partially loculated right effusion. No change. Electronically Signed   By: Charlett Nose M.D.   On: 06/08/2017 11:22   Dg Chest Bilateral Decubitus  Result Date: 06/08/2017 CLINICAL DATA:  Shortness of breath, history of pleural effusion EXAM: CHEST - BILATERAL DECUBITUS VIEW COMPARISON:  Chest x-ray of 06/07/2017 FINDINGS: Right decubitus and left lateral decubitus films of the chest were obtained. The left pleural effusion appears free-flowing and slightly larger than the right. The right pleural effusion also is free-flowing. No definite pneumonia is seen. IMPRESSION: Bilateral free-flowing pleural effusions left slightly larger right. Electronically Signed   By: Dwyane Dee M.D.   On: 06/08/2017 08:53   Ir Thoracentesis Asp Pleural Space W/img Guide  Result Date: 06/08/2017 INDICATION: Recurrent left pleural effusion. Request is made for therapeutic thoracentesis. EXAM: ULTRASOUND GUIDED THERAPEUTIC THORACENTESIS MEDICATIONS: 1% lidocaine COMPLICATIONS: None immediate. PROCEDURE: An ultrasound guided thoracentesis was thoroughly discussed with the patient and questions answered. The benefits, risks, alternatives and complications were also discussed. The patient understands and wishes to proceed with the procedure. Written consent was obtained. Ultrasound was performed to localize and mark an  adequate pocket of fluid in the left chest. Both the left and right side were evaluated. The left had more fluid and therefore this was the side selected. The area was then prepped and draped in the normal sterile fashion. 1% Lidocaine was used for local anesthesia. Under ultrasound guidance a Safe-T-Centesis catheter was introduced. Thoracentesis was performed. The catheter was removed and a dressing applied. FINDINGS: A total of approximately 0.75 L of serous fluid was removed. IMPRESSION: Successful ultrasound guided left thoracentesis yielding 0.75 L of pleural fluid. Read by: Barnetta Chapel, PA-C Electronically Signed   By: Irish Lack M.D.   On: 06/08/2017 16:46   US Thoracentesis Asp Pleural Space W/img Guide  Result Date: 06/06/2017 INDICATION: Patient with history of CHF, left pleural effusion. Request is made for diagnostic and therapeutic left thoracentesis. EXAM: ULTRASOUND GUIDED DIAGNOSTIC AND THERAPEUTIC LEFT THORACENTESIS MEDICATIONS: 10 mL 1% lidocaine COMPLICATIONS: None immediate. PROCEDURE: An ultrasound guided thoracentesis was thoroughly discussed with the patient and questions answered. The benefits, risks, alternatives and complications were also discussed. The patient understands and wishes to proceed with the procedure. Written consent was obtained. Ultrasound was performed to localize and mark an adequate pocket of fluid in the left chest. The area was then prepped and draped in the normal sterile fashion. 1% Lidocaine was used for local anesthesia. Under ultrasound guidance a Safe-T-Centesis catheter was introduced. Thoracentesis was performed. The catheter was removed and a dressing applied. FINDINGS: A total of approximately 1.2 liters of clear, yellow fluid was removed. Samples were sent to the laboratory as requested  by the clinical team. IMPRESSION: Successful ultrasound guided diagnostic and therapeutic left thoracentesis yielding 1.2 liters of pleural fluid. Read by:  Loyce Dys PA-C Electronically Signed   By: Irish Lack M.D.   On: 06/06/2017 14:23       Subjective: Patient feeling well, no dyspnea or chest pain, no nausea or vomiting.   Discharge Exam: Vitals:   06/10/17 2100 06/11/17 0343  BP: (!) 186/56 138/88  Pulse: 74 70  Resp: 18 18  Temp: 99.3 F (37.4 C) 98.4 F (36.9 C)  SpO2: 94% 94%   Vitals:   06/10/17 1000 06/10/17 1135 06/10/17 2100 06/11/17 0343  BP: 133/80 (!) 144/58 (!) 186/56 138/88  Pulse: 63 (!) 55 74 70  Resp: 18  18 18   Temp:  98.7 F (37.1 C) 99.3 F (37.4 C) 98.4 F (36.9 C)  TempSrc:  Oral Oral Oral  SpO2: 96% 92% 94% 94%  Weight:    59 kg (130 lb 1.6 oz)  Height:        General: Pt is alert, awake, not in acute distress E ENT; mild pallor, no icterus, oral mucosa moist Cardiovascular: RRR, S1/S2 +, no rubs, no gallops Respiratory: CTA bilaterally, no wheezing, no rhonchi, decreased breath sounds at bases, with scattered rales.  Abdominal: Soft, NT, ND, bowel sounds + Extremities: +/++ pitting edema at the dependent zones, no cyanosis    The results of significant diagnostics from this hospitalization (including imaging, microbiology, ancillary and laboratory) are listed below for reference.     Microbiology: Recent Results (from the past 240 hour(s))  Culture, body fluid-bottle     Status: None (Preliminary result)   Collection Time: 06/06/17  1:57 PM  Result Value Ref Range Status   Specimen Description FLUID LEFT PLEURAL  Final   Special Requests NONE  Final   Culture NO GROWTH 4 DAYS  Final   Report Status PENDING  Incomplete  Gram stain     Status: None   Collection Time: 06/06/17  1:57 PM  Result Value Ref Range Status   Specimen Description FLUID LEFT PLEURAL  Final   Special Requests NONE  Final   Gram Stain   Final    FEW WBC PRESENT,BOTH PMN AND MONONUCLEAR NO ORGANISMS SEEN    Report Status 06/06/2017 FINAL  Final     Labs: BNP (last 3 results)  Recent Labs   04/19/17 1234 06/05/17 1917 06/08/17 1933  BNP 1,412.9* 1,485.2* 1,420.4*   Basic Metabolic Panel:  Recent Labs Lab 06/05/17 1917  06/07/17 0540 06/08/17 0516 06/09/17 0518 06/10/17 0517 06/11/17 0614  NA 136  < > 138 136 136 134* 135  K 4.0  < > 4.3 4.5 4.8 5.5* 4.8  CL 106  < > 106 106 105 102 103  CO2 20*  < > 23 24 23 23 23   GLUCOSE 101*  < > 93 98 101* 121* 96  BUN 29*  < > 30* 40* 49* 55* 62*  CREATININE 2.16*  < > 2.44* 2.57* 2.79* 2.63* 2.51*  CALCIUM 7.9*  < > 8.1* 7.7* 7.7* 8.3* 8.1*  MG 2.1  --  2.2 2.3 2.1 2.4  --   < > = values in this interval not displayed. Liver Function Tests:  Recent Labs Lab 06/08/17 1220 06/09/17 0518  AST  --  20  ALT  --  13*  ALKPHOS  --  67  BILITOT  --  0.4  PROT  --  4.8*  ALBUMIN 2.5* 2.4*  No results for input(s): LIPASE, AMYLASE in the last 168 hours. No results for input(s): AMMONIA in the last 168 hours. CBC:  Recent Labs Lab 06/05/17 1049 06/06/17 0718 06/07/17 0540 06/09/17 0518 06/10/17 0517  WBC 7.8 6.2 8.0 6.5 7.9  NEUTROABS  --  4.6 6.6 5.0  --   HGB 12.2 10.1* 10.6* 8.8* 8.9*  HCT 37.8 32.2* 33.7* 27.9* 28.3*  MCV 86.5 87.0 87.5 86.6 87.3  PLT 256 194 204 167 187   Cardiac Enzymes:  Recent Labs Lab 06/05/17 1049 06/05/17 1917 06/06/17 0014 06/06/17 0718  TROPONINI 0.04* 0.04* 0.04* 0.04*   BNP: Invalid input(s): POCBNP CBG: No results for input(s): GLUCAP in the last 168 hours. D-Dimer No results for input(s): DDIMER in the last 72 hours. Hgb A1c No results for input(s): HGBA1C in the last 72 hours. Lipid Profile No results for input(s): CHOL, HDL, LDLCALC, TRIG, CHOLHDL, LDLDIRECT in the last 72 hours. Thyroid function studies No results for input(s): TSH, T4TOTAL, T3FREE, THYROIDAB in the last 72 hours.  Invalid input(s): FREET3 Anemia work up No results for input(s): VITAMINB12, FOLATE, FERRITIN, TIBC, IRON, RETICCTPCT in the last 72 hours. Urinalysis    Component Value  Date/Time   COLORURINE YELLOW 04/19/2017 1415   APPEARANCEUR CLEAR 04/19/2017 1415   LABSPEC 1.017 04/19/2017 1415   PHURINE 6.0 04/19/2017 1415   GLUCOSEU NEGATIVE 04/19/2017 1415   HGBUR NEGATIVE 04/19/2017 1415   BILIRUBINUR NEGATIVE 04/19/2017 1415   KETONESUR NEGATIVE 04/19/2017 1415   PROTEINUR >300 (A) 04/19/2017 1415   UROBILINOGEN 0.2 06/29/2013 0512   NITRITE NEGATIVE 04/19/2017 1415   LEUKOCYTESUR NEGATIVE 04/19/2017 1415   Sepsis Labs Invalid input(s): PROCALCITONIN,  WBC,  LACTICIDVEN Microbiology Recent Results (from the past 240 hour(s))  Culture, body fluid-bottle     Status: None (Preliminary result)   Collection Time: 06/06/17  1:57 PM  Result Value Ref Range Status   Specimen Description FLUID LEFT PLEURAL  Final   Special Requests NONE  Final   Culture NO GROWTH 4 DAYS  Final   Report Status PENDING  Incomplete  Gram stain     Status: None   Collection Time: 06/06/17  1:57 PM  Result Value Ref Range Status   Specimen Description FLUID LEFT PLEURAL  Final   Special Requests NONE  Final   Gram Stain   Final    FEW WBC PRESENT,BOTH PMN AND MONONUCLEAR NO ORGANISMS SEEN    Report Status 06/06/2017 FINAL  Final     Time coordinating discharge: 45 minutes  SIGNED:   Coralie KeensMauricio Daniel Brenon Antosh, MD  [facility] 06/11/2017, 9:41 AM Pager [phone]  If 7PM-7AM, please contact night-coverage [email or web address] Password [email or web address]

## 2017-06-11 NOTE — Telephone Encounter (Signed)
New message     Pt is going home with hospice service and would they would like to you to serve as attending physician, they need verbal order for them to go out

## 2017-06-11 NOTE — Progress Notes (Signed)
[facility]  RN  [facility] - they do not have a delivery time set as of yet.  The equipment will be delivered to the home and a portable tank will come to the patien'ts room prior to discharge for travel home.  Will continue to monitor.   Thank you.  Adele BarthelAmy Evans, RN, BSN [facility] [phone]  All hospital liaisons are now on AMION.

## 2017-06-11 NOTE — Telephone Encounter (Signed)
Routing to Dr. Ross

## 2017-06-11 NOTE — Progress Notes (Signed)
[facility]  RN  [facility] by 130pm today.  Portable tank should be on the way to patient's room for transport home.  Warren LacyAdvised Brenda, CMRN, of same.   Thank you.  Adele BarthelAmy Evans, RN, BSN [facility] [phone]  All hospital liaisons are now on AMION.

## 2017-06-11 NOTE — Progress Notes (Signed)
IV and tele removed. Discharge instruction given.

## 2017-06-14 ENCOUNTER — Telehealth: Payer: Self-pay | Admitting: Nurse Practitioner

## 2017-06-14 NOTE — Telephone Encounter (Signed)
Follow up     Caller stated that she was told Dr Tenny Crawoss would sign the paperwork and send it back to them.

## 2017-06-14 NOTE — Telephone Encounter (Signed)
Error

## 2017-06-14 NOTE — Telephone Encounter (Signed)
On Friday, October 19 I was talking with [facility]Jenn [facility] and she asked me to review this patient's chart for hospice order from Dr. Tenny Crawoss. I advised that I do not see any documentation from Dr. Tenny Crawoss regarding ordering hospice for this patient. I advised that I will route the message to Dr. Tenny Crawoss for review. Britt BoozerJenn stated that she would ask their supervising physician to cover the patient's orders for the weekend and await Dr. Tenny Crawoss' reply. She thanked me for my help.

## 2017-06-15 NOTE — Telephone Encounter (Signed)
I spoke to [name] while pt  Was in hospital Reasonable to consult palliative care

## 2017-06-17 NOTE — Telephone Encounter (Signed)
Left message on daughter's voice mail [facility]) that we scheduled patient for sooner appointment (per Dr. Tenny Crawoss).  She is scheduled 07/08/17 at 10:00.  Asked her to call back or send a my chart message if this date is not good.  Called hosp[facility] and [facility] of [place].  Informed (per Dr. Tenny Crawoss) that she will be the attending for this patient.

## 2017-06-21 ENCOUNTER — Encounter (HOSPITAL_COMMUNITY): Payer: Medicare Other

## 2017-06-21 ENCOUNTER — Ambulatory Visit: Payer: Medicare Other | Admitting: Family

## 2017-06-25 ENCOUNTER — Encounter: Payer: Self-pay | Admitting: Internal Medicine

## 2017-07-08 ENCOUNTER — Ambulatory Visit: Payer: Medicare Other | Admitting: Internal Medicine

## 2017-08-27 ENCOUNTER — Ambulatory Visit: Payer: Medicare Other | Admitting: Internal Medicine

## 2017-09-08 ENCOUNTER — Telehealth: Payer: Self-pay | Admitting: Internal Medicine

## 2017-09-14 ENCOUNTER — Telehealth: Payer: Self-pay | Admitting: Internal Medicine

## 2017-09-14 NOTE — Telephone Encounter (Signed)
[facility] [place]  [facility]. Dr.Ross has agreed to sign. I have placed on her Desk like she asked in case she comes over this week before her next day in clinic. I will also remind Dennie Bibleat who will be covering her Thursday in Clinic.

## 2017-09-16 ENCOUNTER — Telehealth: Payer: Self-pay | Admitting: Internal Medicine

## 2017-09-16 NOTE — Telephone Encounter (Signed)
Original D/C signed & completed Dr.Ross. [facility] to  [facility]  Vital Records [phone] [place] [id number]

## 2017-09-24 NOTE — Telephone Encounter (Signed)
Robie RidgeKay Cook with [facility] called asking if Dr.Ross will sign d/c on patient. Made her aware Dr.Ross is out of office this week she will return Monday & Thursday of next week kay stated that is fine she will place d/c in mail to our office to my attention. If Dr.Ross signs next week one day this will be ok.

## 2017-09-24 DEATH — deceased

## 2017-11-04 ENCOUNTER — Other Ambulatory Visit: Payer: Self-pay | Admitting: Nurse Practitioner

## 2018-10-21 NOTE — Telephone Encounter (Signed)
ERROR

## 2018-11-05 IMAGING — CT CT ABD-PELV W/O CM
2 of 4 series · 10 of 46 positions shown, 11 images · non-contrast
Comparison: None.

CLINICAL DATA: Upper abdominal pain for 2 days.

EXAM:
CT ABDOMEN AND PELVIS WITHOUT CONTRAST
TECHNIQUE: Multidetector CT imaging of the abdomen and pelvis was performed
following the standard protocol without IV contrast.

[Series 201: routine, idose (2) · axial · 0.78mm/px · z∈[-507,-107]mm · 7 of 96 slices shown, 8 images]
[im 8/96  soft-tissue]
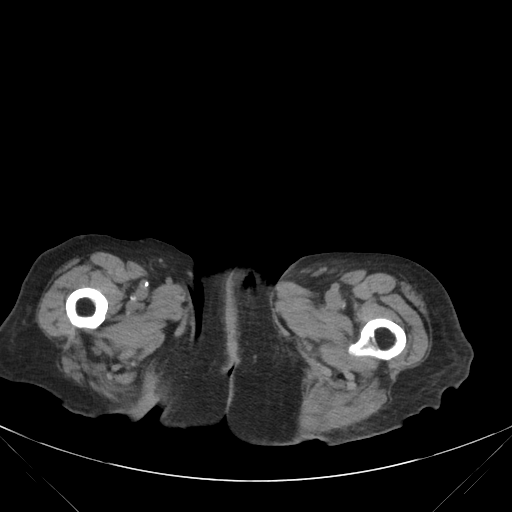
[im 8/96  bone]
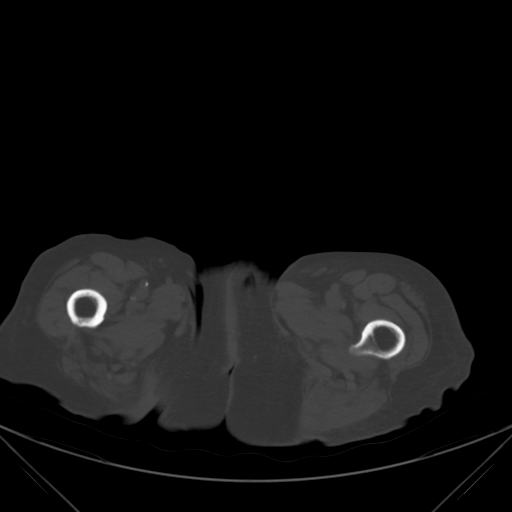
[im 20/96  soft-tissue]
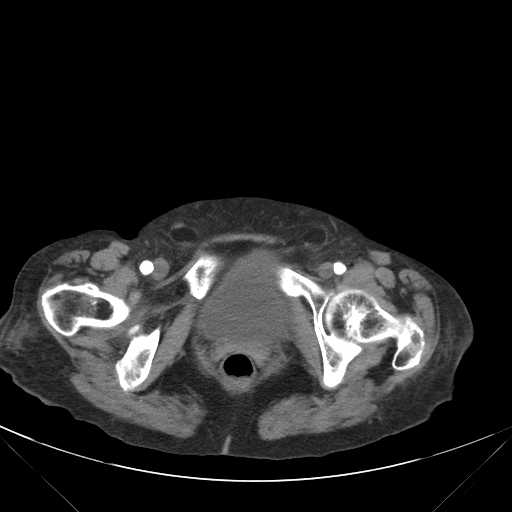
[im 36/96  soft-tissue]
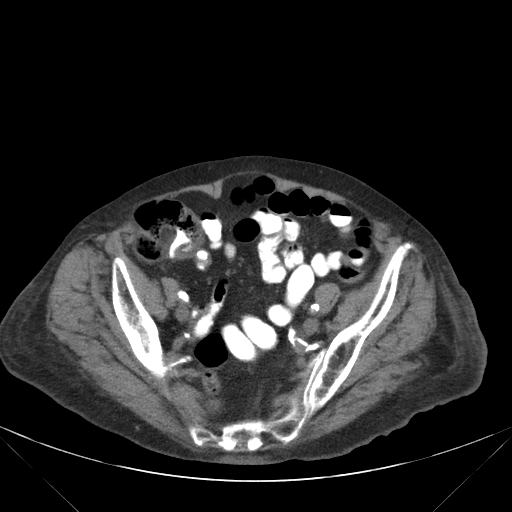
[im 48/96  soft-tissue]
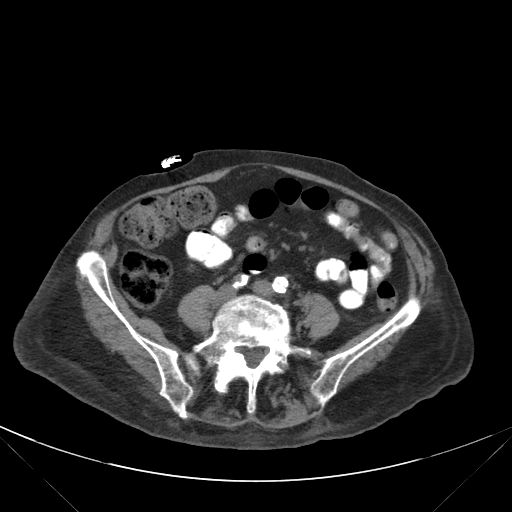
[im 60/96  soft-tissue]
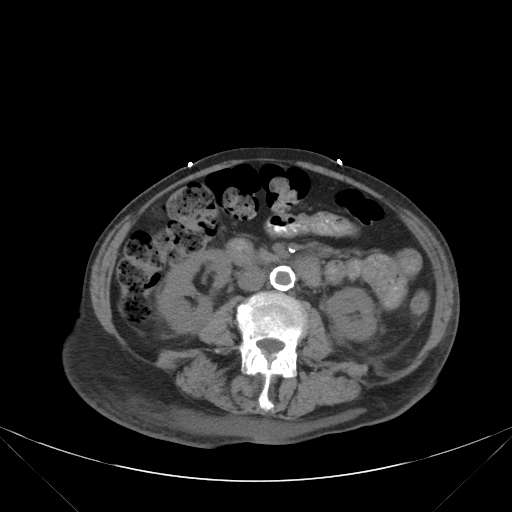
[im 76/96  soft-tissue]
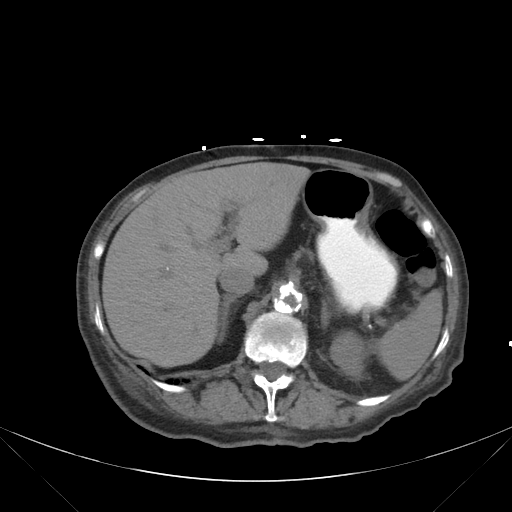
[im 88/96  soft-tissue]
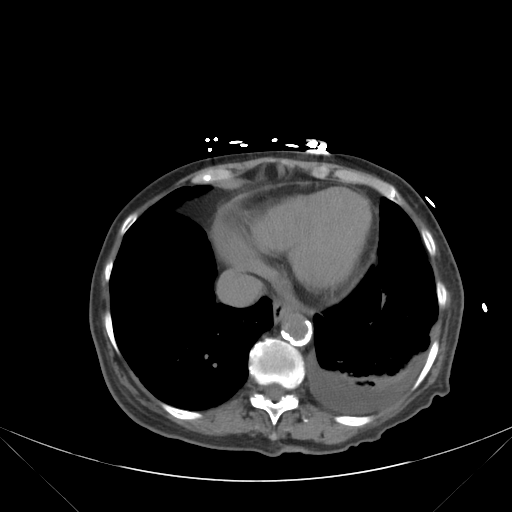

[Series 203: coronals, idose (2) · coronal · 0.45mm/px · 3 of 118 slices shown]
[im 40/118  soft-tissue]
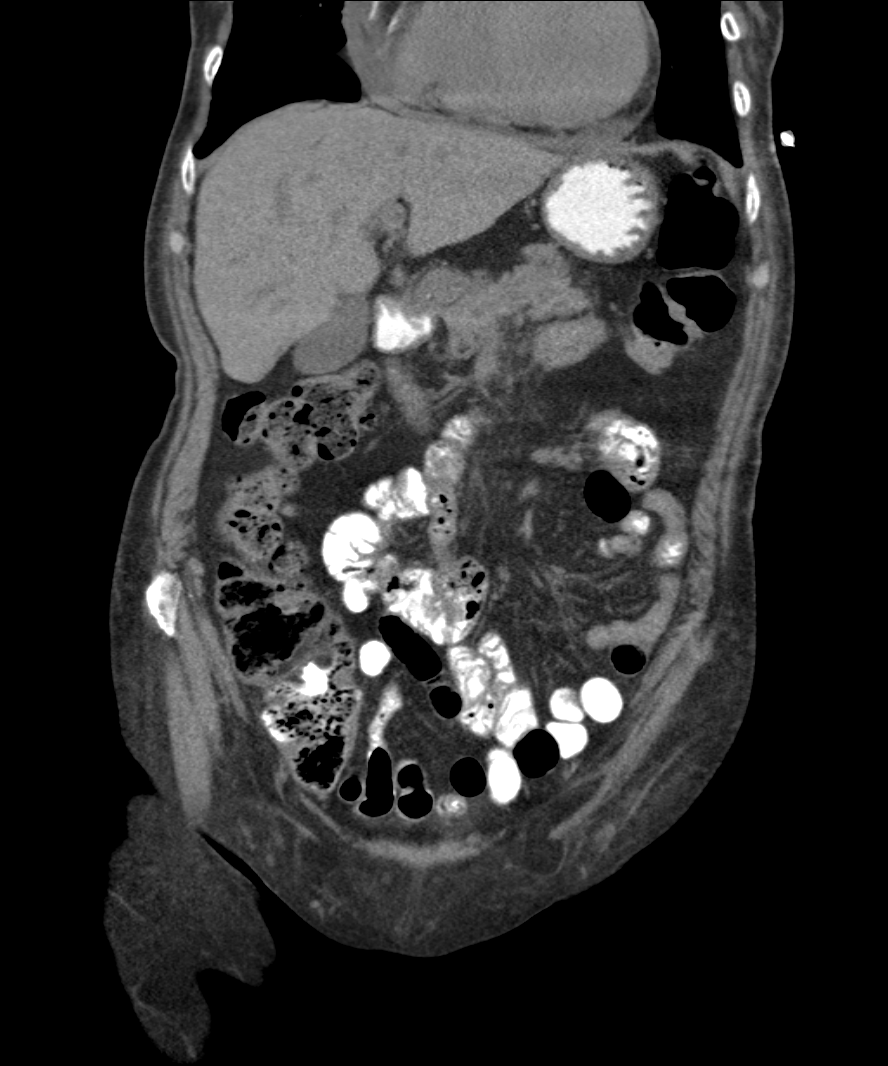
[im 53/118  soft-tissue]
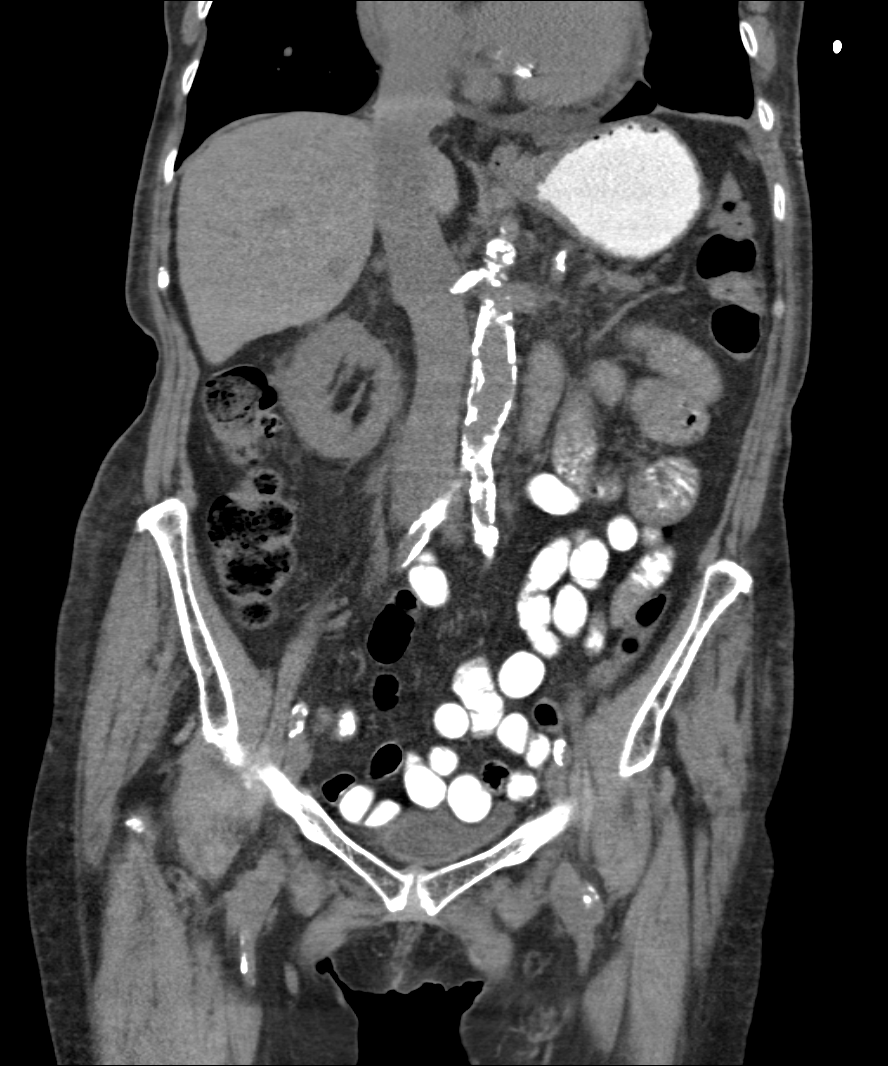
[im 66/118  soft-tissue]
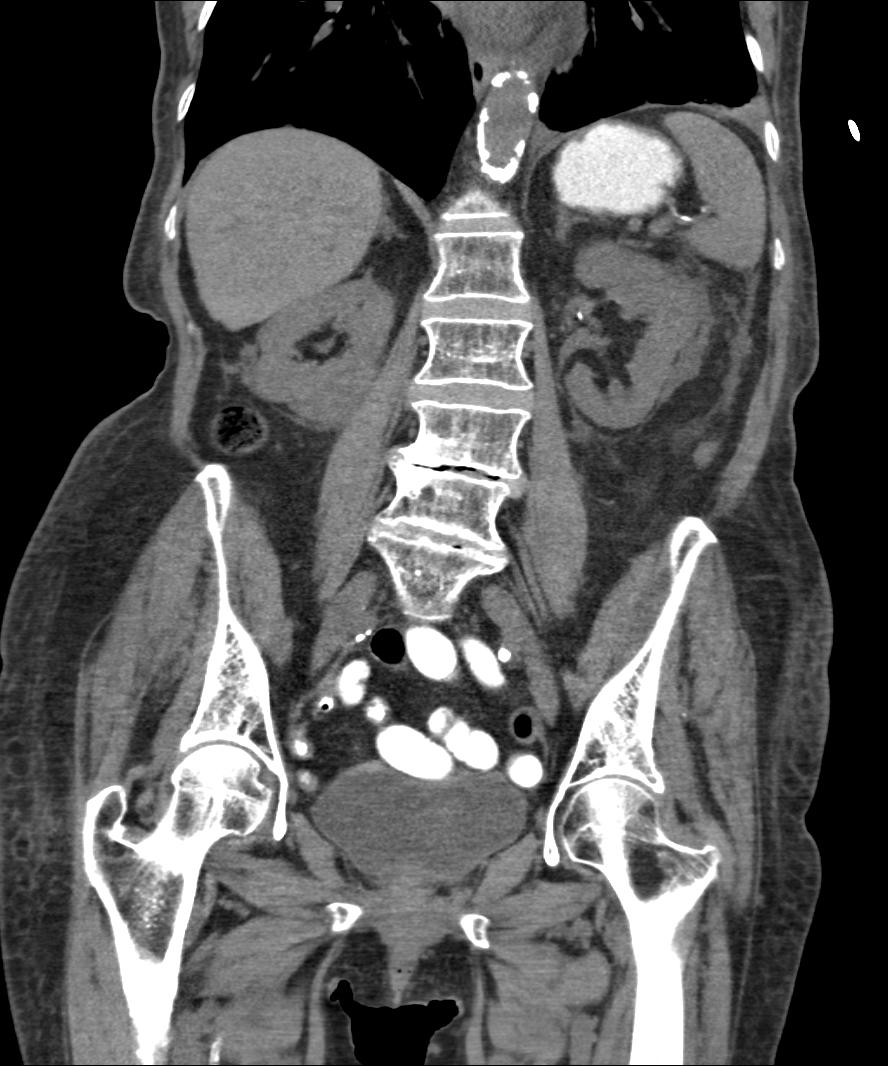

[10 of 46 positions shown; findings below may reference images not displayed]

FINDINGS: Lower chest: There is left base collapse/ consolidation with small
left pleural effusion. Heart is enlarged. Trace pericardial effusion
noted.

Hepatobiliary: 11 mm water density lesion lateral segment left liver
is compatible with cyst. There is no evidence for gallstones,
gallbladder wall thickening, or pericholecystic fluid. No
intrahepatic or extrahepatic biliary dilation.

Pancreas: No focal mass lesion. No dilatation of the main duct. No
intraparenchymal cyst. No peripancreatic edema.

Spleen: No splenomegaly. No focal mass lesion.

Adrenals/Urinary Tract: No adrenal nodule or mass. 2 mm
nonobstructing stone identified lower pole left kidney. Vascular
calcification noted in the hilum of each kidney. Bilateral
perinephric stranding may be nonacute. No evidence for
hydronephrosis. No evidence for hydroureter. The urinary bladder
appears normal for the degree of distention.

Stomach/Bowel: Stomach is nondistended. No gastric wall thickening.
No evidence of outlet obstruction. No appreciable hiatal hernia on
the current study. Duodenum is normally positioned as is the
ligament of Treitz. No small bowel wall thickening. No small bowel
dilatation. The terminal ileum is normal. The appendix is not
visualized, but there is no edema or inflammation in the region of
the cecum. No gross colonic mass. No colonic wall thickening. No
substantial diverticular change.

Vascular/Lymphatic: There is abdominal aortic atherosclerosis
without aneurysm. There is no gastrohepatic or hepatoduodenal
ligament lymphadenopathy. No intraperitoneal or retroperitoneal
lymphadenopathy. No pelvic sidewall lymphadenopathy.

Reproductive: Uterus is surgically absent. There is no adnexal mass.

Other: Trace free fluid is noted in the cul-de-sac.

Musculoskeletal: Bone windows reveal no worrisome lytic or sclerotic
osseous lesions. Small bilateral groin hernias contain only fat.
IMPRESSION: 1. Left base collapse/consolidation with small left pleural
effusion. Pneumonia could have this appearance. Given unilateral
Bo[place] and station, malignancy not excluded.
2. No definite findings in the abdomen dizzy explain the patient's
history of pain. Bilateral perinephric stranding is evident, but
this can be nonacute.
3. Trace intraperitoneal free fluid, indeterminate.
4. Abdominal aortic atherosclerosis.
5. Diffuse body wall edema.
